# Patient Record
Sex: Female | Born: 1965 | Race: Black or African American | Hispanic: No | Marital: Married | Smoking: Current some day smoker
Health system: Southern US, Community
[De-identification: ages and names within clinical notes are randomized; demographics above are authoritative.]

## PROBLEM LIST (undated history)

## (undated) DIAGNOSIS — G4730 Sleep apnea, unspecified: Secondary | ICD-10-CM

## (undated) DIAGNOSIS — R51 Headache: Secondary | ICD-10-CM

## (undated) DIAGNOSIS — F329 Major depressive disorder, single episode, unspecified: Secondary | ICD-10-CM

## (undated) DIAGNOSIS — Z9221 Personal history of antineoplastic chemotherapy: Secondary | ICD-10-CM

## (undated) DIAGNOSIS — B351 Tinea unguium: Secondary | ICD-10-CM

## (undated) DIAGNOSIS — K5900 Constipation, unspecified: Secondary | ICD-10-CM

## (undated) DIAGNOSIS — R0602 Shortness of breath: Secondary | ICD-10-CM

## (undated) DIAGNOSIS — H548 Legal blindness, as defined in USA: Secondary | ICD-10-CM

## (undated) DIAGNOSIS — H3552 Pigmentary retinal dystrophy: Secondary | ICD-10-CM

## (undated) DIAGNOSIS — F32A Depression, unspecified: Secondary | ICD-10-CM

## (undated) DIAGNOSIS — Z8489 Family history of other specified conditions: Secondary | ICD-10-CM

## (undated) DIAGNOSIS — I10 Essential (primary) hypertension: Secondary | ICD-10-CM

## (undated) DIAGNOSIS — M1990 Unspecified osteoarthritis, unspecified site: Secondary | ICD-10-CM

## (undated) DIAGNOSIS — T883XXA Malignant hyperthermia due to anesthesia, initial encounter: Secondary | ICD-10-CM

## (undated) DIAGNOSIS — M797 Fibromyalgia: Secondary | ICD-10-CM

## (undated) DIAGNOSIS — E785 Hyperlipidemia, unspecified: Secondary | ICD-10-CM

## (undated) DIAGNOSIS — C3490 Malignant neoplasm of unspecified part of unspecified bronchus or lung: Secondary | ICD-10-CM

## (undated) DIAGNOSIS — Z923 Personal history of irradiation: Secondary | ICD-10-CM

## (undated) DIAGNOSIS — F419 Anxiety disorder, unspecified: Secondary | ICD-10-CM

## (undated) DIAGNOSIS — E119 Type 2 diabetes mellitus without complications: Secondary | ICD-10-CM

## (undated) DIAGNOSIS — L309 Dermatitis, unspecified: Secondary | ICD-10-CM

## (undated) DIAGNOSIS — K219 Gastro-esophageal reflux disease without esophagitis: Secondary | ICD-10-CM

## (undated) HISTORY — DX: Personal history of antineoplastic chemotherapy: Z92.21

## (undated) HISTORY — DX: Tinea unguium: B35.1

## (undated) HISTORY — DX: Legal blindness, as defined in USA: H54.8

## (undated) HISTORY — DX: Hyperlipidemia, unspecified: E78.5

## (undated) HISTORY — DX: Personal history of irradiation: Z92.3

## (undated) HISTORY — DX: Essential (primary) hypertension: I10

## (undated) HISTORY — DX: Pigmentary retinal dystrophy: H35.52

## (undated) HISTORY — PX: EYE SURGERY: SHX253

## (undated) HISTORY — DX: Type 2 diabetes mellitus without complications: E11.9

## (undated) HISTORY — DX: Headache: R51

## (undated) HISTORY — DX: Dermatitis, unspecified: L30.9

---

## 1999-11-01 ENCOUNTER — Emergency Department (HOSPITAL_COMMUNITY): Admission: EM | Admit: 1999-11-01 | Discharge: 1999-11-01 | Payer: Self-pay | Admitting: Emergency Medicine

## 2000-01-01 ENCOUNTER — Emergency Department (HOSPITAL_COMMUNITY): Admission: EM | Admit: 2000-01-01 | Discharge: 2000-01-01 | Payer: Self-pay | Admitting: Emergency Medicine

## 2000-01-06 ENCOUNTER — Encounter: Payer: Self-pay | Admitting: Emergency Medicine

## 2000-01-06 ENCOUNTER — Encounter: Admission: RE | Admit: 2000-01-06 | Discharge: 2000-01-06 | Payer: Self-pay | Admitting: Emergency Medicine

## 2000-06-08 ENCOUNTER — Other Ambulatory Visit: Admission: RE | Admit: 2000-06-08 | Discharge: 2000-06-08 | Payer: Self-pay | Admitting: Family Medicine

## 2000-06-08 ENCOUNTER — Encounter: Admission: RE | Admit: 2000-06-08 | Discharge: 2000-06-08 | Payer: Self-pay | Admitting: Family Medicine

## 2000-07-26 ENCOUNTER — Encounter: Payer: Self-pay | Admitting: Emergency Medicine

## 2000-07-26 ENCOUNTER — Emergency Department (HOSPITAL_COMMUNITY): Admission: EM | Admit: 2000-07-26 | Discharge: 2000-07-27 | Payer: Self-pay | Admitting: Dentistry

## 2000-08-19 ENCOUNTER — Emergency Department (HOSPITAL_COMMUNITY): Admission: EM | Admit: 2000-08-19 | Discharge: 2000-08-19 | Payer: Self-pay | Admitting: Emergency Medicine

## 2000-08-25 ENCOUNTER — Encounter: Admission: RE | Admit: 2000-08-25 | Discharge: 2000-08-25 | Payer: Self-pay | Admitting: Family Medicine

## 2000-08-25 ENCOUNTER — Encounter: Payer: Self-pay | Admitting: Family Medicine

## 2000-09-06 ENCOUNTER — Encounter: Admission: RE | Admit: 2000-09-06 | Discharge: 2000-09-06 | Payer: Self-pay | Admitting: Family Medicine

## 2000-09-08 ENCOUNTER — Encounter: Admission: RE | Admit: 2000-09-08 | Discharge: 2000-09-08 | Payer: Self-pay | Admitting: Family Medicine

## 2000-09-08 ENCOUNTER — Ambulatory Visit (HOSPITAL_COMMUNITY): Admission: RE | Admit: 2000-09-08 | Discharge: 2000-09-08 | Payer: Self-pay | Admitting: Family Medicine

## 2000-12-07 ENCOUNTER — Emergency Department (HOSPITAL_COMMUNITY): Admission: EM | Admit: 2000-12-07 | Discharge: 2000-12-07 | Payer: Self-pay | Admitting: Emergency Medicine

## 2001-03-21 ENCOUNTER — Emergency Department (HOSPITAL_COMMUNITY): Admission: EM | Admit: 2001-03-21 | Discharge: 2001-03-22 | Payer: Self-pay | Admitting: Emergency Medicine

## 2001-03-23 ENCOUNTER — Encounter: Admission: RE | Admit: 2001-03-23 | Discharge: 2001-03-23 | Payer: Self-pay | Admitting: Family Medicine

## 2001-05-22 ENCOUNTER — Encounter: Admission: RE | Admit: 2001-05-22 | Discharge: 2001-05-22 | Payer: Self-pay | Admitting: Family Medicine

## 2001-05-29 ENCOUNTER — Encounter: Admission: RE | Admit: 2001-05-29 | Discharge: 2001-05-29 | Payer: Self-pay | Admitting: Family Medicine

## 2001-05-31 ENCOUNTER — Emergency Department (HOSPITAL_COMMUNITY): Admission: EM | Admit: 2001-05-31 | Discharge: 2001-05-31 | Payer: Self-pay | Admitting: Emergency Medicine

## 2001-06-04 ENCOUNTER — Inpatient Hospital Stay (HOSPITAL_COMMUNITY): Admission: AD | Admit: 2001-06-04 | Discharge: 2001-06-04 | Payer: Self-pay | Admitting: Obstetrics and Gynecology

## 2001-06-06 ENCOUNTER — Encounter: Admission: RE | Admit: 2001-06-06 | Discharge: 2001-06-06 | Payer: Self-pay | Admitting: *Deleted

## 2001-06-15 ENCOUNTER — Emergency Department (HOSPITAL_COMMUNITY): Admission: EM | Admit: 2001-06-15 | Discharge: 2001-06-15 | Payer: Self-pay | Admitting: Emergency Medicine

## 2001-08-15 ENCOUNTER — Other Ambulatory Visit: Admission: RE | Admit: 2001-08-15 | Discharge: 2001-08-15 | Payer: Self-pay | Admitting: Obstetrics and Gynecology

## 2001-09-19 ENCOUNTER — Inpatient Hospital Stay (HOSPITAL_COMMUNITY): Admission: RE | Admit: 2001-09-19 | Discharge: 2001-09-21 | Payer: Self-pay | Admitting: Obstetrics and Gynecology

## 2001-09-19 ENCOUNTER — Encounter (INDEPENDENT_AMBULATORY_CARE_PROVIDER_SITE_OTHER): Payer: Self-pay

## 2001-09-19 HISTORY — PX: ABDOMINAL HYSTERECTOMY: SHX81

## 2002-04-18 ENCOUNTER — Encounter: Admission: RE | Admit: 2002-04-18 | Discharge: 2002-04-18 | Payer: Self-pay | Admitting: Family Medicine

## 2002-06-14 ENCOUNTER — Encounter: Admission: RE | Admit: 2002-06-14 | Discharge: 2002-06-14 | Payer: Self-pay | Admitting: Family Medicine

## 2002-06-24 ENCOUNTER — Emergency Department (HOSPITAL_COMMUNITY): Admission: EM | Admit: 2002-06-24 | Discharge: 2002-06-24 | Payer: Self-pay | Admitting: Emergency Medicine

## 2002-10-13 ENCOUNTER — Encounter (INDEPENDENT_AMBULATORY_CARE_PROVIDER_SITE_OTHER): Payer: Self-pay | Admitting: *Deleted

## 2002-10-13 LAB — CONVERTED CEMR LAB

## 2002-10-17 ENCOUNTER — Other Ambulatory Visit: Admission: RE | Admit: 2002-10-17 | Discharge: 2002-10-17 | Payer: Self-pay | Admitting: Family Medicine

## 2002-10-17 ENCOUNTER — Encounter: Admission: RE | Admit: 2002-10-17 | Discharge: 2002-10-17 | Payer: Self-pay | Admitting: Family Medicine

## 2002-11-28 ENCOUNTER — Encounter: Admission: RE | Admit: 2002-11-28 | Discharge: 2002-11-28 | Payer: Self-pay | Admitting: Family Medicine

## 2002-12-26 ENCOUNTER — Encounter: Admission: RE | Admit: 2002-12-26 | Discharge: 2002-12-26 | Payer: Self-pay | Admitting: Family Medicine

## 2003-01-17 ENCOUNTER — Encounter: Admission: RE | Admit: 2003-01-17 | Discharge: 2003-01-17 | Payer: Self-pay | Admitting: Family Medicine

## 2003-01-22 ENCOUNTER — Encounter: Admission: RE | Admit: 2003-01-22 | Discharge: 2003-01-22 | Payer: Self-pay | Admitting: Family Medicine

## 2003-01-29 ENCOUNTER — Encounter: Admission: RE | Admit: 2003-01-29 | Discharge: 2003-01-29 | Payer: Self-pay | Admitting: Family Medicine

## 2003-03-19 ENCOUNTER — Encounter: Admission: RE | Admit: 2003-03-19 | Discharge: 2003-03-19 | Payer: Self-pay | Admitting: Family Medicine

## 2003-04-22 ENCOUNTER — Encounter: Admission: RE | Admit: 2003-04-22 | Discharge: 2003-04-22 | Payer: Self-pay | Admitting: Family Medicine

## 2003-04-24 ENCOUNTER — Encounter: Admission: RE | Admit: 2003-04-24 | Discharge: 2003-04-24 | Payer: Self-pay | Admitting: Sports Medicine

## 2003-06-10 ENCOUNTER — Encounter: Admission: RE | Admit: 2003-06-10 | Discharge: 2003-06-10 | Payer: Self-pay | Admitting: Family Medicine

## 2003-06-19 ENCOUNTER — Encounter: Admission: RE | Admit: 2003-06-19 | Discharge: 2003-06-19 | Payer: Self-pay | Admitting: Family Medicine

## 2003-07-21 ENCOUNTER — Encounter: Admission: RE | Admit: 2003-07-21 | Discharge: 2003-07-21 | Payer: Self-pay | Admitting: Family Medicine

## 2003-08-20 ENCOUNTER — Encounter: Admission: RE | Admit: 2003-08-20 | Discharge: 2003-08-20 | Payer: Self-pay | Admitting: Family Medicine

## 2003-08-27 ENCOUNTER — Encounter: Admission: RE | Admit: 2003-08-27 | Discharge: 2003-08-27 | Payer: Self-pay | Admitting: Family Medicine

## 2003-09-12 ENCOUNTER — Encounter: Admission: RE | Admit: 2003-09-12 | Discharge: 2003-09-12 | Payer: Self-pay | Admitting: Family Medicine

## 2003-10-28 ENCOUNTER — Encounter: Admission: RE | Admit: 2003-10-28 | Discharge: 2003-10-28 | Payer: Self-pay | Admitting: Family Medicine

## 2003-11-20 ENCOUNTER — Ambulatory Visit: Payer: Self-pay | Admitting: Family Medicine

## 2003-11-24 ENCOUNTER — Encounter: Admission: RE | Admit: 2003-11-24 | Discharge: 2003-11-24 | Payer: Self-pay | Admitting: Sports Medicine

## 2003-12-08 ENCOUNTER — Ambulatory Visit: Payer: Self-pay | Admitting: Family Medicine

## 2004-01-13 ENCOUNTER — Ambulatory Visit: Payer: Self-pay | Admitting: Sports Medicine

## 2004-06-09 ENCOUNTER — Ambulatory Visit: Payer: Self-pay | Admitting: Family Medicine

## 2004-07-03 ENCOUNTER — Emergency Department (HOSPITAL_COMMUNITY): Admission: EM | Admit: 2004-07-03 | Discharge: 2004-07-03 | Payer: Self-pay | Admitting: Family Medicine

## 2004-07-07 ENCOUNTER — Ambulatory Visit: Payer: Self-pay | Admitting: Family Medicine

## 2004-09-16 ENCOUNTER — Ambulatory Visit: Payer: Self-pay | Admitting: Family Medicine

## 2004-10-19 ENCOUNTER — Ambulatory Visit: Payer: Self-pay | Admitting: Family Medicine

## 2004-10-20 ENCOUNTER — Ambulatory Visit (HOSPITAL_COMMUNITY): Admission: RE | Admit: 2004-10-20 | Discharge: 2004-10-20 | Payer: Self-pay | Admitting: Family Medicine

## 2004-10-26 ENCOUNTER — Emergency Department (HOSPITAL_COMMUNITY): Admission: EM | Admit: 2004-10-26 | Discharge: 2004-10-26 | Payer: Self-pay | Admitting: Family Medicine

## 2005-03-24 ENCOUNTER — Ambulatory Visit: Payer: Self-pay | Admitting: Family Medicine

## 2005-04-15 ENCOUNTER — Ambulatory Visit (HOSPITAL_COMMUNITY): Admission: RE | Admit: 2005-04-15 | Discharge: 2005-04-15 | Payer: Self-pay | Admitting: Sports Medicine

## 2005-05-12 ENCOUNTER — Ambulatory Visit: Payer: Self-pay | Admitting: Family Medicine

## 2005-06-07 ENCOUNTER — Ambulatory Visit: Payer: Self-pay | Admitting: Sports Medicine

## 2005-06-30 ENCOUNTER — Ambulatory Visit: Payer: Self-pay | Admitting: Family Medicine

## 2005-07-18 ENCOUNTER — Ambulatory Visit: Payer: Self-pay | Admitting: Family Medicine

## 2005-08-15 ENCOUNTER — Emergency Department (HOSPITAL_COMMUNITY): Admission: EM | Admit: 2005-08-15 | Discharge: 2005-08-15 | Payer: Self-pay | Admitting: Family Medicine

## 2005-08-16 ENCOUNTER — Emergency Department (HOSPITAL_COMMUNITY): Admission: EM | Admit: 2005-08-16 | Discharge: 2005-08-16 | Payer: Self-pay | Admitting: Emergency Medicine

## 2005-11-07 ENCOUNTER — Emergency Department (HOSPITAL_COMMUNITY): Admission: EM | Admit: 2005-11-07 | Discharge: 2005-11-07 | Payer: Self-pay | Admitting: Family Medicine

## 2005-11-08 ENCOUNTER — Ambulatory Visit: Payer: Self-pay | Admitting: Sports Medicine

## 2006-01-09 ENCOUNTER — Emergency Department (HOSPITAL_COMMUNITY): Admission: EM | Admit: 2006-01-09 | Discharge: 2006-01-09 | Payer: Self-pay | Admitting: Emergency Medicine

## 2006-02-27 ENCOUNTER — Ambulatory Visit: Payer: Self-pay | Admitting: Sports Medicine

## 2006-04-21 ENCOUNTER — Ambulatory Visit: Payer: Self-pay | Admitting: Family Medicine

## 2006-05-11 DIAGNOSIS — F17200 Nicotine dependence, unspecified, uncomplicated: Secondary | ICD-10-CM | POA: Insufficient documentation

## 2006-05-11 DIAGNOSIS — IMO0002 Reserved for concepts with insufficient information to code with codable children: Secondary | ICD-10-CM | POA: Insufficient documentation

## 2006-05-11 DIAGNOSIS — F339 Major depressive disorder, recurrent, unspecified: Secondary | ICD-10-CM | POA: Insufficient documentation

## 2006-05-11 DIAGNOSIS — M1710 Unilateral primary osteoarthritis, unspecified knee: Secondary | ICD-10-CM

## 2006-05-12 ENCOUNTER — Encounter (INDEPENDENT_AMBULATORY_CARE_PROVIDER_SITE_OTHER): Payer: Self-pay | Admitting: *Deleted

## 2006-05-23 ENCOUNTER — Encounter (INDEPENDENT_AMBULATORY_CARE_PROVIDER_SITE_OTHER): Payer: Self-pay | Admitting: Family Medicine

## 2006-07-13 ENCOUNTER — Ambulatory Visit: Payer: Self-pay | Admitting: Sports Medicine

## 2006-07-13 ENCOUNTER — Encounter (INDEPENDENT_AMBULATORY_CARE_PROVIDER_SITE_OTHER): Payer: Self-pay | Admitting: Family Medicine

## 2006-07-18 ENCOUNTER — Emergency Department (HOSPITAL_COMMUNITY): Admission: EM | Admit: 2006-07-18 | Discharge: 2006-07-19 | Payer: Self-pay | Admitting: Emergency Medicine

## 2006-07-25 ENCOUNTER — Telehealth: Payer: Self-pay | Admitting: *Deleted

## 2006-07-28 ENCOUNTER — Encounter (INDEPENDENT_AMBULATORY_CARE_PROVIDER_SITE_OTHER): Payer: Self-pay | Admitting: Family Medicine

## 2006-12-01 ENCOUNTER — Encounter (INDEPENDENT_AMBULATORY_CARE_PROVIDER_SITE_OTHER): Payer: Self-pay | Admitting: Family Medicine

## 2006-12-07 ENCOUNTER — Telehealth: Payer: Self-pay | Admitting: *Deleted

## 2006-12-11 ENCOUNTER — Ambulatory Visit: Payer: Self-pay | Admitting: Family Medicine

## 2006-12-12 ENCOUNTER — Encounter (INDEPENDENT_AMBULATORY_CARE_PROVIDER_SITE_OTHER): Payer: Self-pay | Admitting: *Deleted

## 2006-12-15 ENCOUNTER — Encounter (INDEPENDENT_AMBULATORY_CARE_PROVIDER_SITE_OTHER): Payer: Self-pay | Admitting: Family Medicine

## 2007-01-02 ENCOUNTER — Telehealth (INDEPENDENT_AMBULATORY_CARE_PROVIDER_SITE_OTHER): Payer: Self-pay | Admitting: *Deleted

## 2007-01-02 ENCOUNTER — Ambulatory Visit: Payer: Self-pay | Admitting: Family Medicine

## 2007-01-08 ENCOUNTER — Encounter (INDEPENDENT_AMBULATORY_CARE_PROVIDER_SITE_OTHER): Payer: Self-pay | Admitting: Family Medicine

## 2007-01-10 ENCOUNTER — Encounter: Payer: Self-pay | Admitting: *Deleted

## 2007-01-26 ENCOUNTER — Ambulatory Visit: Payer: Self-pay

## 2007-01-26 LAB — CONVERTED CEMR LAB
Bilirubin Urine: NEGATIVE
Blood in Urine, dipstick: NEGATIVE
Glucose, Urine, Semiquant: NEGATIVE
Ketones, urine, test strip: NEGATIVE
Nitrite: NEGATIVE
Protein, U semiquant: 30
Specific Gravity, Urine: 1.02
Urobilinogen, UA: 1
WBC Urine, dipstick: NEGATIVE
pH: 6.5

## 2007-02-01 ENCOUNTER — Encounter (INDEPENDENT_AMBULATORY_CARE_PROVIDER_SITE_OTHER): Payer: Self-pay | Admitting: Family Medicine

## 2007-02-01 ENCOUNTER — Ambulatory Visit: Payer: Self-pay | Admitting: Family Medicine

## 2007-02-01 DIAGNOSIS — E119 Type 2 diabetes mellitus without complications: Secondary | ICD-10-CM | POA: Insufficient documentation

## 2007-02-01 DIAGNOSIS — E1165 Type 2 diabetes mellitus with hyperglycemia: Secondary | ICD-10-CM | POA: Insufficient documentation

## 2007-02-01 LAB — CONVERTED CEMR LAB
ALT: 14 units/L (ref 0–35)
AST: 14 units/L (ref 0–37)
Albumin: 4.5 g/dL (ref 3.5–5.2)
Alkaline Phosphatase: 47 units/L (ref 39–117)
BUN: 11 mg/dL (ref 6–23)
CO2: 25 meq/L (ref 19–32)
Calcium: 9.4 mg/dL (ref 8.4–10.5)
Chloride: 101 meq/L (ref 96–112)
Cholesterol: 168 mg/dL (ref 0–200)
Creatinine, Ser: 1.2 mg/dL (ref 0.40–1.20)
Glucose, Bld: 116 mg/dL — ABNORMAL HIGH (ref 70–99)
HDL: 36 mg/dL — ABNORMAL LOW (ref >39)
Hgb A1c MFr Bld: 6.2 %
LDL Cholesterol: 107 mg/dL — ABNORMAL HIGH (ref 0–99)
Potassium: 3.5 meq/L (ref 3.5–5.3)
Sodium: 139 meq/L (ref 135–145)
TSH: 0.604 microintl units/mL (ref 0.350–5.50)
Total Bilirubin: 0.9 mg/dL (ref 0.3–1.2)
Total CHOL/HDL Ratio: 4.7
Total Protein: 7.3 g/dL (ref 6.0–8.3)
Triglycerides: 125 mg/dL (ref <150)
VLDL: 25 mg/dL (ref 0–40)

## 2007-02-06 ENCOUNTER — Encounter (INDEPENDENT_AMBULATORY_CARE_PROVIDER_SITE_OTHER): Payer: Self-pay | Admitting: Family Medicine

## 2007-02-12 ENCOUNTER — Encounter (INDEPENDENT_AMBULATORY_CARE_PROVIDER_SITE_OTHER): Payer: Self-pay | Admitting: Family Medicine

## 2007-02-14 ENCOUNTER — Encounter: Payer: Self-pay | Admitting: *Deleted

## 2007-04-12 ENCOUNTER — Encounter (INDEPENDENT_AMBULATORY_CARE_PROVIDER_SITE_OTHER): Payer: Self-pay | Admitting: Family Medicine

## 2007-04-24 ENCOUNTER — Emergency Department (HOSPITAL_COMMUNITY): Admission: EM | Admit: 2007-04-24 | Discharge: 2007-04-24 | Payer: Self-pay | Admitting: Emergency Medicine

## 2007-05-04 ENCOUNTER — Encounter: Payer: Self-pay | Admitting: *Deleted

## 2007-05-09 ENCOUNTER — Encounter (INDEPENDENT_AMBULATORY_CARE_PROVIDER_SITE_OTHER): Payer: Self-pay | Admitting: *Deleted

## 2007-05-21 ENCOUNTER — Telehealth: Payer: Self-pay | Admitting: *Deleted

## 2007-05-22 ENCOUNTER — Encounter (INDEPENDENT_AMBULATORY_CARE_PROVIDER_SITE_OTHER): Payer: Self-pay | Admitting: Family Medicine

## 2007-05-22 ENCOUNTER — Ambulatory Visit: Payer: Self-pay | Admitting: Family Medicine

## 2007-05-22 LAB — CONVERTED CEMR LAB
ALT: 17 units/L (ref 0–35)
AST: 20 units/L (ref 0–37)
Albumin: 4.7 g/dL (ref 3.5–5.2)
Alkaline Phosphatase: 47 units/L (ref 39–117)
BUN: 12 mg/dL (ref 6–23)
Bilirubin Urine: NEGATIVE
Blood in Urine, dipstick: NEGATIVE
CO2: 26 meq/L (ref 19–32)
Calcium: 9.2 mg/dL (ref 8.4–10.5)
Chloride: 101 meq/L (ref 96–112)
Creatinine, Ser: 1.26 mg/dL — ABNORMAL HIGH (ref 0.40–1.20)
Glucose, Bld: 102 mg/dL — ABNORMAL HIGH (ref 70–99)
Glucose, Urine, Semiquant: NEGATIVE
H Pylori IgG: POSITIVE
HCV Ab: NEGATIVE
Hep A IgM: NEGATIVE
Hep B C IgM: NEGATIVE
Hepatitis B Surface Ag: NEGATIVE
Hgb A1c MFr Bld: 6.1 %
Ketones, urine, test strip: NEGATIVE
Nitrite: NEGATIVE
Potassium: 3.5 meq/L (ref 3.5–5.3)
Protein, U semiquant: 100
Sodium: 141 meq/L (ref 135–145)
Specific Gravity, Urine: >=1.03
Total Bilirubin: 0.7 mg/dL (ref 0.3–1.2)
Total Protein: 7.6 g/dL (ref 6.0–8.3)
Urobilinogen, UA: 1
WBC Urine, dipstick: NEGATIVE
pH: 6

## 2007-05-23 ENCOUNTER — Ambulatory Visit (HOSPITAL_COMMUNITY): Admission: RE | Admit: 2007-05-23 | Discharge: 2007-05-23 | Payer: Self-pay | Admitting: Family Medicine

## 2007-05-25 ENCOUNTER — Telehealth: Payer: Self-pay | Admitting: *Deleted

## 2007-07-17 ENCOUNTER — Encounter (INDEPENDENT_AMBULATORY_CARE_PROVIDER_SITE_OTHER): Payer: Self-pay | Admitting: Family Medicine

## 2007-07-17 ENCOUNTER — Encounter: Payer: Self-pay | Admitting: *Deleted

## 2007-07-17 ENCOUNTER — Ambulatory Visit: Payer: Self-pay | Admitting: Family Medicine

## 2007-07-17 LAB — CONVERTED CEMR LAB

## 2007-07-18 LAB — CONVERTED CEMR LAB
ALT: 14 units/L (ref 0–35)
AST: 16 units/L (ref 0–37)
Albumin: 4.5 g/dL (ref 3.5–5.2)
Alkaline Phosphatase: 44 units/L (ref 39–117)
BUN: 8 mg/dL (ref 6–23)
CO2: 25 meq/L (ref 19–32)
Calcium: 9.4 mg/dL (ref 8.4–10.5)
Chloride: 106 meq/L (ref 96–112)
Creatinine, Ser: 1.06 mg/dL (ref 0.40–1.20)
Glucose, Bld: 86 mg/dL (ref 70–99)
Potassium: 3.8 meq/L (ref 3.5–5.3)
Sodium: 142 meq/L (ref 135–145)
Total Bilirubin: 0.7 mg/dL (ref 0.3–1.2)
Total CK: 174 units/L (ref 7–177)
Total Protein: 7.3 g/dL (ref 6.0–8.3)

## 2007-07-25 ENCOUNTER — Ambulatory Visit (HOSPITAL_COMMUNITY): Admission: RE | Admit: 2007-07-25 | Discharge: 2007-07-25 | Payer: Self-pay | Admitting: Family Medicine

## 2007-07-25 ENCOUNTER — Ambulatory Visit: Payer: Self-pay | Admitting: Vascular Surgery

## 2007-07-25 ENCOUNTER — Telehealth (INDEPENDENT_AMBULATORY_CARE_PROVIDER_SITE_OTHER): Payer: Self-pay | Admitting: Family Medicine

## 2007-07-25 ENCOUNTER — Encounter (INDEPENDENT_AMBULATORY_CARE_PROVIDER_SITE_OTHER): Payer: Self-pay | Admitting: Neurology

## 2007-08-14 ENCOUNTER — Encounter (INDEPENDENT_AMBULATORY_CARE_PROVIDER_SITE_OTHER): Payer: Self-pay | Admitting: Family Medicine

## 2007-08-14 ENCOUNTER — Ambulatory Visit: Payer: Self-pay | Admitting: Family Medicine

## 2007-08-14 ENCOUNTER — Encounter: Payer: Self-pay | Admitting: *Deleted

## 2007-08-14 LAB — CONVERTED CEMR LAB
HCT: 47.1 % — ABNORMAL HIGH (ref 36.0–46.0)
Hemoglobin: 14.9 g/dL (ref 12.0–15.0)
MCHC: 31.6 g/dL (ref 30.0–36.0)
MCV: 94.4 fL (ref 78.0–100.0)
Platelets: 261 10*3/uL (ref 150–400)
RBC: 4.99 M/uL (ref 3.87–5.11)
RDW: 14.3 % (ref 11.5–15.5)
WBC: 10.1 10*3/uL (ref 4.0–10.5)

## 2007-09-26 ENCOUNTER — Ambulatory Visit: Payer: Self-pay | Admitting: Family Medicine

## 2007-09-26 ENCOUNTER — Encounter (INDEPENDENT_AMBULATORY_CARE_PROVIDER_SITE_OTHER): Payer: Self-pay | Admitting: Family Medicine

## 2007-09-26 LAB — CONVERTED CEMR LAB
Bilirubin Urine: NEGATIVE
Blood in Urine, dipstick: NEGATIVE
Glucose, Urine, Semiquant: NEGATIVE
Ketones, urine, test strip: NEGATIVE
Nitrite: NEGATIVE
Specific Gravity, Urine: 1.02
Urobilinogen, UA: 0.2
WBC Urine, dipstick: NEGATIVE
pH: 6.5

## 2007-09-27 LAB — CONVERTED CEMR LAB
Chlamydia, DNA Probe: NEGATIVE
GC Probe Amp, Genital: NEGATIVE

## 2007-10-09 ENCOUNTER — Ambulatory Visit: Payer: Self-pay | Admitting: Family Medicine

## 2007-10-09 LAB — CONVERTED CEMR LAB
Cholesterol, target level: 200 mg/dL
HDL goal, serum: 40 mg/dL
LDL Goal: 100 mg/dL

## 2007-10-16 ENCOUNTER — Encounter: Payer: Self-pay | Admitting: Family Medicine

## 2007-10-22 ENCOUNTER — Ambulatory Visit: Payer: Self-pay | Admitting: Family Medicine

## 2007-11-15 ENCOUNTER — Encounter: Payer: Self-pay | Admitting: Family Medicine

## 2007-12-14 ENCOUNTER — Encounter: Payer: Self-pay | Admitting: Family Medicine

## 2008-01-02 ENCOUNTER — Encounter: Payer: Self-pay | Admitting: Family Medicine

## 2008-02-04 ENCOUNTER — Telehealth: Payer: Self-pay | Admitting: *Deleted

## 2008-02-05 ENCOUNTER — Encounter: Payer: Self-pay | Admitting: Family Medicine

## 2008-02-05 ENCOUNTER — Inpatient Hospital Stay (HOSPITAL_COMMUNITY): Admission: EM | Admit: 2008-02-05 | Discharge: 2008-02-07 | Payer: Self-pay | Admitting: Emergency Medicine

## 2008-02-05 ENCOUNTER — Ambulatory Visit: Payer: Self-pay | Admitting: Family Medicine

## 2008-02-06 ENCOUNTER — Encounter: Payer: Self-pay | Admitting: Family Medicine

## 2008-02-06 LAB — CONVERTED CEMR LAB
Cholesterol: 158 mg/dL
HDL: 31 mg/dL
LDL Cholesterol: 113 mg/dL
Triglycerides: 71 mg/dL

## 2008-02-11 ENCOUNTER — Encounter: Payer: Self-pay | Admitting: Family Medicine

## 2008-02-12 ENCOUNTER — Encounter: Payer: Self-pay | Admitting: Family Medicine

## 2008-02-12 ENCOUNTER — Telehealth: Payer: Self-pay | Admitting: Family Medicine

## 2008-02-12 ENCOUNTER — Ambulatory Visit (HOSPITAL_COMMUNITY): Admission: RE | Admit: 2008-02-12 | Discharge: 2008-02-12 | Payer: Self-pay | Admitting: Family Medicine

## 2008-02-14 ENCOUNTER — Encounter: Payer: Self-pay | Admitting: Family Medicine

## 2008-02-14 ENCOUNTER — Telehealth: Payer: Self-pay | Admitting: Family Medicine

## 2008-02-14 ENCOUNTER — Ambulatory Visit: Payer: Self-pay | Admitting: Pulmonary Disease

## 2008-02-15 ENCOUNTER — Telehealth: Payer: Self-pay | Admitting: Family Medicine

## 2008-02-20 ENCOUNTER — Ambulatory Visit: Payer: Self-pay | Admitting: Family Medicine

## 2008-02-21 ENCOUNTER — Ambulatory Visit (HOSPITAL_COMMUNITY): Admission: RE | Admit: 2008-02-21 | Discharge: 2008-02-21 | Payer: Self-pay | Admitting: Pulmonary Disease

## 2008-02-21 ENCOUNTER — Encounter: Payer: Self-pay | Admitting: Pulmonary Disease

## 2008-02-21 ENCOUNTER — Encounter (INDEPENDENT_AMBULATORY_CARE_PROVIDER_SITE_OTHER): Payer: Self-pay | Admitting: Interventional Radiology

## 2008-02-25 ENCOUNTER — Telehealth: Payer: Self-pay | Admitting: Pulmonary Disease

## 2008-02-26 ENCOUNTER — Telehealth: Payer: Self-pay | Admitting: *Deleted

## 2008-02-28 ENCOUNTER — Ambulatory Visit: Payer: Self-pay | Admitting: Pulmonary Disease

## 2008-03-04 ENCOUNTER — Ambulatory Visit: Admission: RE | Admit: 2008-03-04 | Discharge: 2008-03-04 | Payer: Self-pay | Admitting: Pulmonary Disease

## 2008-03-05 ENCOUNTER — Ambulatory Visit: Payer: Self-pay | Admitting: Thoracic Surgery

## 2008-03-18 ENCOUNTER — Encounter: Payer: Self-pay | Admitting: Pulmonary Disease

## 2008-03-21 ENCOUNTER — Telehealth: Payer: Self-pay | Admitting: Family Medicine

## 2008-03-24 ENCOUNTER — Encounter: Payer: Self-pay | Admitting: Thoracic Surgery

## 2008-03-24 ENCOUNTER — Ambulatory Visit: Payer: Self-pay | Admitting: Pulmonary Disease

## 2008-03-24 ENCOUNTER — Inpatient Hospital Stay (HOSPITAL_COMMUNITY): Admission: RE | Admit: 2008-03-24 | Discharge: 2008-04-07 | Payer: Self-pay | Admitting: Thoracic Surgery

## 2008-03-24 ENCOUNTER — Ambulatory Visit: Payer: Self-pay | Admitting: Thoracic Surgery

## 2008-03-24 HISTORY — PX: OTHER SURGICAL HISTORY: SHX169

## 2008-04-01 HISTORY — PX: CHEST TUBE INSERTION: SHX231

## 2008-04-15 ENCOUNTER — Ambulatory Visit: Payer: Self-pay | Admitting: Thoracic Surgery

## 2008-04-15 ENCOUNTER — Encounter: Admission: RE | Admit: 2008-04-15 | Discharge: 2008-04-15 | Payer: Self-pay | Admitting: Thoracic Surgery

## 2008-04-17 ENCOUNTER — Encounter (INDEPENDENT_AMBULATORY_CARE_PROVIDER_SITE_OTHER): Payer: Self-pay | Admitting: Family Medicine

## 2008-04-17 ENCOUNTER — Encounter: Payer: Self-pay | Admitting: Family Medicine

## 2008-04-17 ENCOUNTER — Ambulatory Visit: Payer: Self-pay | Admitting: Family Medicine

## 2008-04-17 LAB — CONVERTED CEMR LAB
ALT: 21 units/L (ref 0–35)
AST: 16 units/L (ref 0–37)
Albumin: 3.1 g/dL — ABNORMAL LOW (ref 3.5–5.2)
Alkaline Phosphatase: 87 units/L (ref 39–117)
BUN: 9 mg/dL (ref 6–23)
Basophils Absolute: 0.1 10*3/uL (ref 0.0–0.1)
Basophils Relative: 1 % (ref 0–1)
CO2: 24 meq/L (ref 19–32)
Calcium: 9.3 mg/dL (ref 8.4–10.5)
Chloride: 104 meq/L (ref 96–112)
Creatinine, Ser: 1.05 mg/dL (ref 0.40–1.20)
Eosinophils Absolute: 0.5 10*3/uL (ref 0.0–0.7)
Eosinophils Relative: 5 % (ref 0–5)
Glucose, Bld: 72 mg/dL (ref 70–99)
H Pylori IgG: NEGATIVE
HCT: 31 % — ABNORMAL LOW (ref 36.0–46.0)
Hemoglobin: 10.7 g/dL — ABNORMAL LOW (ref 12.0–15.0)
Hgb A1c MFr Bld: 6.6 %
Lymphocytes Relative: 17 % (ref 12–46)
Lymphs Abs: 1.9 10*3/uL (ref 0.7–4.0)
MCHC: 34.4 g/dL (ref 30.0–36.0)
MCV: 85.8 fL (ref 78.0–100.0)
Monocytes Absolute: 0.9 10*3/uL (ref 0.1–1.0)
Monocytes Relative: 8 % (ref 3–12)
Neutro Abs: 7.3 10*3/uL (ref 1.7–7.7)
Neutrophils Relative %: 69 % (ref 43–77)
Platelets: 661 10*3/uL — ABNORMAL HIGH (ref 150–400)
Potassium: 4.1 meq/L (ref 3.5–5.3)
RBC: 3.62 M/uL — ABNORMAL LOW (ref 3.87–5.11)
RDW: 14.8 % (ref 11.5–15.5)
Sodium: 140 meq/L (ref 135–145)
Total Bilirubin: 0.8 mg/dL (ref 0.3–1.2)
Total Protein: 7.5 g/dL (ref 6.0–8.3)
WBC: 10.6 10*3/uL — ABNORMAL HIGH (ref 4.0–10.5)

## 2008-04-18 ENCOUNTER — Encounter: Payer: Self-pay | Admitting: Family Medicine

## 2008-04-29 ENCOUNTER — Ambulatory Visit: Payer: Self-pay | Admitting: Thoracic Surgery

## 2008-04-29 ENCOUNTER — Encounter: Admission: RE | Admit: 2008-04-29 | Discharge: 2008-04-29 | Payer: Self-pay | Admitting: Thoracic Surgery

## 2008-04-30 ENCOUNTER — Ambulatory Visit: Payer: Self-pay | Admitting: Internal Medicine

## 2008-05-05 ENCOUNTER — Ambulatory Visit: Payer: Self-pay | Admitting: Thoracic Surgery

## 2008-05-06 ENCOUNTER — Encounter: Payer: Self-pay | Admitting: Family Medicine

## 2008-05-12 ENCOUNTER — Telehealth: Payer: Self-pay | Admitting: Family Medicine

## 2008-05-14 ENCOUNTER — Ambulatory Visit: Payer: Self-pay | Admitting: Family Medicine

## 2008-05-14 ENCOUNTER — Encounter: Payer: Self-pay | Admitting: Family Medicine

## 2008-05-14 LAB — CONVERTED CEMR LAB
ALT: 8 units/L (ref 0–35)
AST: 9 units/L (ref 0–37)
Albumin: 4.1 g/dL (ref 3.5–5.2)
Alkaline Phosphatase: 51 units/L (ref 39–117)
BUN: 9 mg/dL (ref 6–23)
CO2: 23 meq/L (ref 19–32)
Calcium: 9.1 mg/dL (ref 8.4–10.5)
Chloride: 102 meq/L (ref 96–112)
Creatinine, Ser: 1 mg/dL (ref 0.40–1.20)
Glucose, Bld: 98 mg/dL (ref 70–99)
Lipase: 63 units/L (ref 0–75)
Potassium: 3.6 meq/L (ref 3.5–5.3)
Sodium: 139 meq/L (ref 135–145)
Total Bilirubin: 0.4 mg/dL (ref 0.3–1.2)
Total Protein: 7.4 g/dL (ref 6.0–8.3)

## 2008-05-15 ENCOUNTER — Encounter: Payer: Self-pay | Admitting: Family Medicine

## 2008-05-21 ENCOUNTER — Telehealth: Payer: Self-pay | Admitting: Family Medicine

## 2008-05-21 ENCOUNTER — Encounter: Admission: RE | Admit: 2008-05-21 | Discharge: 2008-05-21 | Payer: Self-pay | Admitting: Family Medicine

## 2008-05-22 ENCOUNTER — Telehealth: Payer: Self-pay | Admitting: Family Medicine

## 2008-05-28 ENCOUNTER — Ambulatory Visit: Payer: Self-pay | Admitting: Thoracic Surgery

## 2008-05-28 ENCOUNTER — Encounter: Admission: RE | Admit: 2008-05-28 | Discharge: 2008-05-28 | Payer: Self-pay | Admitting: Thoracic Surgery

## 2008-05-30 ENCOUNTER — Telehealth: Payer: Self-pay | Admitting: Family Medicine

## 2008-06-24 ENCOUNTER — Ambulatory Visit: Payer: Self-pay | Admitting: Family Medicine

## 2008-06-24 ENCOUNTER — Encounter: Payer: Self-pay | Admitting: Family Medicine

## 2008-08-05 ENCOUNTER — Encounter: Admission: RE | Admit: 2008-08-05 | Discharge: 2008-08-05 | Payer: Self-pay | Admitting: Thoracic Surgery

## 2008-08-05 ENCOUNTER — Ambulatory Visit: Payer: Self-pay | Admitting: Thoracic Surgery

## 2008-10-02 ENCOUNTER — Telehealth: Payer: Self-pay | Admitting: Family Medicine

## 2008-10-02 ENCOUNTER — Ambulatory Visit: Payer: Self-pay | Admitting: Family Medicine

## 2008-10-02 ENCOUNTER — Encounter: Payer: Self-pay | Admitting: Family Medicine

## 2008-10-02 LAB — CONVERTED CEMR LAB: TSH: 0.635 microintl units/mL (ref 0.350–4.500)

## 2008-10-20 ENCOUNTER — Telehealth: Payer: Self-pay | Admitting: Family Medicine

## 2008-10-20 ENCOUNTER — Telehealth: Payer: Self-pay | Admitting: *Deleted

## 2008-10-21 ENCOUNTER — Encounter: Payer: Self-pay | Admitting: Family Medicine

## 2008-10-21 ENCOUNTER — Ambulatory Visit: Payer: Self-pay | Admitting: Family Medicine

## 2008-10-21 ENCOUNTER — Telehealth: Payer: Self-pay | Admitting: *Deleted

## 2008-10-27 ENCOUNTER — Ambulatory Visit: Payer: Self-pay | Admitting: Internal Medicine

## 2008-10-29 ENCOUNTER — Ambulatory Visit (HOSPITAL_COMMUNITY): Admission: RE | Admit: 2008-10-29 | Discharge: 2008-10-29 | Payer: Self-pay | Admitting: Internal Medicine

## 2008-10-29 LAB — COMPREHENSIVE METABOLIC PANEL
ALT: 20 U/L (ref 0–35)
AST: 21 U/L (ref 0–37)
Albumin: 4.1 g/dL (ref 3.5–5.2)
Alkaline Phosphatase: 49 U/L (ref 39–117)
BUN: 10 mg/dL (ref 6–23)
CO2: 31 mEq/L (ref 19–32)
Calcium: 9.1 mg/dL (ref 8.4–10.5)
Chloride: 104 mEq/L (ref 96–112)
Creatinine, Ser: 1.23 mg/dL — ABNORMAL HIGH (ref 0.40–1.20)
Glucose, Bld: 105 mg/dL — ABNORMAL HIGH (ref 70–99)
Potassium: 3.6 mEq/L (ref 3.5–5.3)
Sodium: 142 mEq/L (ref 135–145)
Total Bilirubin: 0.8 mg/dL (ref 0.3–1.2)
Total Protein: 7.1 g/dL (ref 6.0–8.3)

## 2008-10-29 LAB — CBC WITH DIFFERENTIAL/PLATELET
BASO%: 0.4 % (ref 0.0–2.0)
Basophils Absolute: 0 10*3/uL (ref 0.0–0.1)
EOS%: 2.6 % (ref 0.0–7.0)
Eosinophils Absolute: 0.2 10*3/uL (ref 0.0–0.5)
HCT: 44.5 % (ref 34.8–46.6)
HGB: 15.1 g/dL (ref 11.6–15.9)
LYMPH%: 35.3 % (ref 14.0–49.7)
MCH: 29.7 pg (ref 25.1–34.0)
MCHC: 33.8 g/dL (ref 31.5–36.0)
MCV: 87.8 fL (ref 79.5–101.0)
MONO#: 0.6 10*3/uL (ref 0.1–0.9)
MONO%: 7.1 % (ref 0.0–14.0)
NEUT#: 4.8 10*3/uL (ref 1.5–6.5)
NEUT%: 54.6 % (ref 38.4–76.8)
Platelets: 214 10*3/uL (ref 145–400)
RBC: 5.07 10*6/uL (ref 3.70–5.45)
RDW: 15.1 % — ABNORMAL HIGH (ref 11.2–14.5)
WBC: 8.7 10*3/uL (ref 3.9–10.3)
lymph#: 3.1 10*3/uL (ref 0.9–3.3)

## 2008-11-03 ENCOUNTER — Encounter: Payer: Self-pay | Admitting: Family Medicine

## 2008-11-06 ENCOUNTER — Ambulatory Visit (HOSPITAL_COMMUNITY): Admission: RE | Admit: 2008-11-06 | Discharge: 2008-11-06 | Payer: Self-pay | Admitting: Internal Medicine

## 2008-11-10 ENCOUNTER — Encounter: Payer: Self-pay | Admitting: Family Medicine

## 2008-11-19 ENCOUNTER — Ambulatory Visit: Payer: Self-pay | Admitting: Thoracic Surgery

## 2009-01-13 ENCOUNTER — Encounter: Payer: Self-pay | Admitting: Family Medicine

## 2009-01-13 ENCOUNTER — Ambulatory Visit: Payer: Self-pay | Admitting: Family Medicine

## 2009-01-13 LAB — CONVERTED CEMR LAB
BUN: 11 mg/dL (ref 6–23)
CO2: 28 meq/L (ref 19–32)
Calcium: 9.5 mg/dL (ref 8.4–10.5)
Chloride: 103 meq/L (ref 96–112)
Creatinine, Ser: 1.31 mg/dL — ABNORMAL HIGH (ref 0.40–1.20)
Glucose, Bld: 107 mg/dL — ABNORMAL HIGH (ref 70–99)
Potassium: 3.8 meq/L (ref 3.5–5.3)
Sodium: 142 meq/L (ref 135–145)

## 2009-01-14 ENCOUNTER — Encounter: Payer: Self-pay | Admitting: *Deleted

## 2009-01-15 ENCOUNTER — Encounter: Payer: Self-pay | Admitting: Family Medicine

## 2009-01-15 ENCOUNTER — Encounter: Admission: RE | Admit: 2009-01-15 | Discharge: 2009-01-15 | Payer: Self-pay | Admitting: Family Medicine

## 2009-01-16 ENCOUNTER — Encounter: Payer: Self-pay | Admitting: Family Medicine

## 2009-03-17 ENCOUNTER — Encounter: Payer: Self-pay | Admitting: Family Medicine

## 2009-04-01 ENCOUNTER — Emergency Department (HOSPITAL_COMMUNITY): Admission: EM | Admit: 2009-04-01 | Discharge: 2009-04-01 | Payer: Self-pay | Admitting: Family Medicine

## 2009-04-08 ENCOUNTER — Encounter: Payer: Self-pay | Admitting: Family Medicine

## 2009-04-08 ENCOUNTER — Ambulatory Visit: Payer: Self-pay | Admitting: Family Medicine

## 2009-04-09 ENCOUNTER — Telehealth: Payer: Self-pay | Admitting: Gastroenterology

## 2009-04-17 ENCOUNTER — Encounter: Payer: Self-pay | Admitting: Physician Assistant

## 2009-04-17 ENCOUNTER — Ambulatory Visit: Payer: Self-pay | Admitting: Internal Medicine

## 2009-04-17 DIAGNOSIS — E7800 Pure hypercholesterolemia, unspecified: Secondary | ICD-10-CM | POA: Insufficient documentation

## 2009-04-17 DIAGNOSIS — I10 Essential (primary) hypertension: Secondary | ICD-10-CM | POA: Insufficient documentation

## 2009-04-17 DIAGNOSIS — Z85118 Personal history of other malignant neoplasm of bronchus and lung: Secondary | ICD-10-CM | POA: Insufficient documentation

## 2009-04-20 ENCOUNTER — Encounter: Payer: Self-pay | Admitting: Family Medicine

## 2009-04-20 ENCOUNTER — Ambulatory Visit: Payer: Self-pay | Admitting: Internal Medicine

## 2009-04-20 ENCOUNTER — Encounter: Payer: Self-pay | Admitting: Physician Assistant

## 2009-04-20 LAB — CONVERTED CEMR LAB
ALT: 29 units/L (ref 0–35)
AST: 22 units/L (ref 0–37)
Albumin: 4.4 g/dL (ref 3.5–5.2)
Alkaline Phosphatase: 51 units/L (ref 39–117)
BUN: 9 mg/dL (ref 6–23)
Basophils Absolute: 0.3 10*3/uL — ABNORMAL HIGH (ref 0.0–0.1)
Basophils Relative: 2.8 % (ref 0.0–3.0)
CO2: 31 meq/L (ref 19–32)
Calcium: 9.3 mg/dL (ref 8.4–10.5)
Chloride: 105 meq/L (ref 96–112)
Creatinine, Ser: 1.2 mg/dL (ref 0.4–1.2)
Eosinophils Absolute: 0.3 10*3/uL (ref 0.0–0.7)
Eosinophils Relative: 2.8 % (ref 0.0–5.0)
GFR calc non Af Amer: 62.23 mL/min (ref >60)
Glucose, Bld: 97 mg/dL (ref 70–99)
HCT: 48.3 % — ABNORMAL HIGH (ref 36.0–46.0)
Hemoglobin: 15.9 g/dL — ABNORMAL HIGH (ref 12.0–15.0)
Lipase: 43 units/L (ref 11.0–59.0)
Lymphocytes Relative: 30 % (ref 12.0–46.0)
Lymphs Abs: 3.1 10*3/uL (ref 0.7–4.0)
MCHC: 32.8 g/dL (ref 30.0–36.0)
MCV: 93.6 fL (ref 78.0–100.0)
Monocytes Absolute: 0.6 10*3/uL (ref 0.1–1.0)
Monocytes Relative: 5.6 % (ref 3.0–12.0)
Neutro Abs: 5.9 10*3/uL (ref 1.4–7.7)
Neutrophils Relative %: 58.8 % (ref 43.0–77.0)
Platelets: 238 10*3/uL (ref 150.0–400.0)
Potassium: 3.2 meq/L — ABNORMAL LOW (ref 3.5–5.1)
RBC: 5.16 M/uL — ABNORMAL HIGH (ref 3.87–5.11)
RDW: 13.9 % (ref 11.5–14.6)
Sodium: 142 meq/L (ref 135–145)
Total Bilirubin: 0.9 mg/dL (ref 0.3–1.2)
Total Protein: 7.5 g/dL (ref 6.0–8.3)
WBC: 10.2 10*3/uL (ref 4.5–10.5)

## 2009-04-21 ENCOUNTER — Telehealth (INDEPENDENT_AMBULATORY_CARE_PROVIDER_SITE_OTHER): Payer: Self-pay | Admitting: *Deleted

## 2009-04-29 ENCOUNTER — Ambulatory Visit: Payer: Self-pay | Admitting: Internal Medicine

## 2009-05-01 ENCOUNTER — Telehealth (INDEPENDENT_AMBULATORY_CARE_PROVIDER_SITE_OTHER): Payer: Self-pay | Admitting: *Deleted

## 2009-05-04 ENCOUNTER — Ambulatory Visit (HOSPITAL_COMMUNITY): Admission: RE | Admit: 2009-05-04 | Discharge: 2009-05-04 | Payer: Self-pay | Admitting: Internal Medicine

## 2009-05-04 ENCOUNTER — Encounter: Payer: Self-pay | Admitting: *Deleted

## 2009-05-04 LAB — COMPREHENSIVE METABOLIC PANEL
ALT: 26 U/L (ref 0–35)
AST: 24 U/L (ref 0–37)
Albumin: 4.3 g/dL (ref 3.5–5.2)
Alkaline Phosphatase: 49 U/L (ref 39–117)
BUN: 10 mg/dL (ref 6–23)
CO2: 31 mEq/L (ref 19–32)
Calcium: 9.2 mg/dL (ref 8.4–10.5)
Chloride: 104 mEq/L (ref 96–112)
Creatinine, Ser: 1.27 mg/dL — ABNORMAL HIGH (ref 0.40–1.20)
Glucose, Bld: 91 mg/dL (ref 70–99)
Potassium: 3.3 mEq/L — ABNORMAL LOW (ref 3.5–5.3)
Sodium: 139 mEq/L (ref 135–145)
Total Bilirubin: 1.2 mg/dL (ref 0.3–1.2)
Total Protein: 7.8 g/dL (ref 6.0–8.3)

## 2009-05-04 LAB — CBC WITH DIFFERENTIAL/PLATELET
BASO%: 0.4 % (ref 0.0–2.0)
Basophils Absolute: 0 10*3/uL (ref 0.0–0.1)
EOS%: 3 % (ref 0.0–7.0)
Eosinophils Absolute: 0.3 10*3/uL (ref 0.0–0.5)
HCT: 49.4 % — ABNORMAL HIGH (ref 34.8–46.6)
HGB: 16.8 g/dL — ABNORMAL HIGH (ref 11.6–15.9)
LYMPH%: 27.5 % (ref 14.0–49.7)
MCH: 30.9 pg (ref 25.1–34.0)
MCHC: 34 g/dL (ref 31.5–36.0)
MCV: 91 fL (ref 79.5–101.0)
MONO#: 0.5 10*3/uL (ref 0.1–0.9)
MONO%: 6.4 % (ref 0.0–14.0)
NEUT#: 5.4 10*3/uL (ref 1.5–6.5)
NEUT%: 62.7 % (ref 38.4–76.8)
Platelets: 208 10*3/uL (ref 145–400)
RBC: 5.43 10*6/uL (ref 3.70–5.45)
RDW: 13.9 % (ref 11.2–14.5)
WBC: 8.6 10*3/uL (ref 3.9–10.3)
lymph#: 2.4 10*3/uL (ref 0.9–3.3)

## 2009-05-06 ENCOUNTER — Ambulatory Visit: Payer: Self-pay | Admitting: Cardiology

## 2009-05-11 ENCOUNTER — Encounter: Payer: Self-pay | Admitting: Family Medicine

## 2009-11-03 ENCOUNTER — Ambulatory Visit (HOSPITAL_BASED_OUTPATIENT_CLINIC_OR_DEPARTMENT_OTHER): Payer: MEDICARE | Admitting: Internal Medicine

## 2009-11-05 ENCOUNTER — Ambulatory Visit (HOSPITAL_COMMUNITY): Admission: RE | Admit: 2009-11-05 | Discharge: 2009-11-05 | Payer: Self-pay | Admitting: Internal Medicine

## 2009-11-05 LAB — CBC WITH DIFFERENTIAL/PLATELET
BASO%: 0.6 % (ref 0.0–2.0)
Basophils Absolute: 0.1 10*3/uL (ref 0.0–0.1)
EOS%: 2.7 % (ref 0.0–7.0)
Eosinophils Absolute: 0.3 10*3/uL (ref 0.0–0.5)
HCT: 48.5 % — ABNORMAL HIGH (ref 34.8–46.6)
HGB: 16.4 g/dL — ABNORMAL HIGH (ref 11.6–15.9)
LYMPH%: 26.3 % (ref 14.0–49.7)
MCH: 30.5 pg (ref 25.1–34.0)
MCHC: 33.8 g/dL (ref 31.5–36.0)
MCV: 90.4 fL (ref 79.5–101.0)
MONO#: 0.7 10*3/uL (ref 0.1–0.9)
MONO%: 7.8 % (ref 0.0–14.0)
NEUT#: 5.9 10*3/uL (ref 1.5–6.5)
NEUT%: 62.6 % (ref 38.4–76.8)
Platelets: 211 10*3/uL (ref 145–400)
RBC: 5.36 10*6/uL (ref 3.70–5.45)
RDW: 14.1 % (ref 11.2–14.5)
WBC: 9.4 10*3/uL (ref 3.9–10.3)
lymph#: 2.5 10*3/uL (ref 0.9–3.3)

## 2009-11-05 LAB — COMPREHENSIVE METABOLIC PANEL
ALT: 24 U/L (ref 0–35)
AST: 22 U/L (ref 0–37)
Albumin: 4.4 g/dL (ref 3.5–5.2)
Alkaline Phosphatase: 46 U/L (ref 39–117)
BUN: 12 mg/dL (ref 6–23)
CO2: 29 mEq/L (ref 19–32)
Calcium: 9 mg/dL (ref 8.4–10.5)
Chloride: 104 mEq/L (ref 96–112)
Creatinine, Ser: 1.3 mg/dL — ABNORMAL HIGH (ref 0.40–1.20)
Glucose, Bld: 107 mg/dL — ABNORMAL HIGH (ref 70–99)
Potassium: 3.2 mEq/L — ABNORMAL LOW (ref 3.5–5.3)
Sodium: 140 mEq/L (ref 135–145)
Total Bilirubin: 1 mg/dL (ref 0.3–1.2)
Total Protein: 7.5 g/dL (ref 6.0–8.3)

## 2009-11-05 LAB — LACTATE DEHYDROGENASE: LDH: 124 U/L (ref 94–250)

## 2009-11-06 ENCOUNTER — Telehealth: Payer: Self-pay | Admitting: Family Medicine

## 2009-11-06 ENCOUNTER — Encounter: Payer: Self-pay | Admitting: Family Medicine

## 2009-11-06 ENCOUNTER — Ambulatory Visit: Payer: Self-pay | Admitting: Family Medicine

## 2009-11-06 ENCOUNTER — Encounter (INDEPENDENT_AMBULATORY_CARE_PROVIDER_SITE_OTHER): Payer: Self-pay | Admitting: Pharmacist

## 2009-11-06 LAB — CONVERTED CEMR LAB
ALT: 19 units/L (ref 0–35)
AST: 18 units/L (ref 0–37)
Albumin: 4.6 g/dL (ref 3.5–5.2)
Alkaline Phosphatase: 46 units/L (ref 39–117)
BUN: 12 mg/dL (ref 6–23)
CO2: 27 meq/L (ref 19–32)
Calcium: 10 mg/dL (ref 8.4–10.5)
Chloride: 104 meq/L (ref 96–112)
Cholesterol: 227 mg/dL — ABNORMAL HIGH (ref 0–200)
Creatinine, Ser: 1.07 mg/dL (ref 0.40–1.20)
Glucose, Bld: 126 mg/dL — ABNORMAL HIGH (ref 70–99)
HDL: 39 mg/dL — ABNORMAL LOW (ref >39)
Hgb A1c MFr Bld: 6.1 %
LDL Cholesterol: 166 mg/dL — ABNORMAL HIGH (ref 0–99)
Potassium: 3.7 meq/L (ref 3.5–5.3)
Sodium: 141 meq/L (ref 135–145)
Total Bilirubin: 0.8 mg/dL (ref 0.3–1.2)
Total CHOL/HDL Ratio: 5.8
Total Protein: 7.4 g/dL (ref 6.0–8.3)
Triglycerides: 110 mg/dL (ref <150)
VLDL: 22 mg/dL (ref 0–40)

## 2009-11-09 ENCOUNTER — Encounter: Payer: Self-pay | Admitting: Family Medicine

## 2010-01-15 ENCOUNTER — Encounter: Payer: Self-pay | Admitting: Family Medicine

## 2010-03-11 ENCOUNTER — Emergency Department (HOSPITAL_COMMUNITY)
Admission: EM | Admit: 2010-03-11 | Discharge: 2010-03-11 | Payer: Self-pay | Source: Home / Self Care | Admitting: Emergency Medicine

## 2010-03-19 ENCOUNTER — Ambulatory Visit
Admission: RE | Admit: 2010-03-19 | Discharge: 2010-03-19 | Payer: Self-pay | Source: Home / Self Care | Attending: Family Medicine | Admitting: Family Medicine

## 2010-03-19 DIAGNOSIS — M545 Low back pain, unspecified: Secondary | ICD-10-CM | POA: Insufficient documentation

## 2010-04-02 ENCOUNTER — Other Ambulatory Visit (HOSPITAL_COMMUNITY): Payer: Self-pay | Admitting: Oncology

## 2010-04-02 DIAGNOSIS — C3490 Malignant neoplasm of unspecified part of unspecified bronchus or lung: Secondary | ICD-10-CM

## 2010-04-04 ENCOUNTER — Encounter: Payer: Self-pay | Admitting: Thoracic Surgery

## 2010-04-04 ENCOUNTER — Encounter: Payer: Self-pay | Admitting: Internal Medicine

## 2010-04-04 ENCOUNTER — Encounter: Payer: Self-pay | Admitting: Sports Medicine

## 2010-04-12 ENCOUNTER — Telehealth: Payer: Self-pay | Admitting: Family Medicine

## 2010-04-13 NOTE — Consult Note (Signed)
Summary: [facility]  [facility]   Imported By: Haydee Salter 01/15/2007 15:09:59  _____________________________________________________________________  External Attachment:    Type:   Image     Comment:   External Document

## 2010-04-13 NOTE — Progress Notes (Signed)
  Phone Note Outgoing Call   Summary of Call:  Morrie Sheldon contacted pre-cert. and they will not approve CT at [facility] only at [facility] so test r/s to [address].Message left on pt's phone to call me back. Initial call taken by: Teryl Lucy RN,  May 01, 2009 8:48 AM

## 2010-04-13 NOTE — Assessment & Plan Note (Signed)
Summary: NEW dx DM II   Vital Signs:  Patient Profile:   45 Years Old Female Weight:      218 pounds Pulse rate:   65 / minute BP sitting:   163 / 97  Vitals Entered By: Lillia Pauls CMA (February 01, 2007 11:06 AM)                 PCP:  Wilhemina Bonito  MD  Chief Complaint:  check for dm.  History of Present Illness: At work last week, got so sick, had shakes, cold sweat, pt got checked for diabetes, and CBG was high.  Pt having complaints of polyuria, polydypsia, fatigue, insomnia, and weight gain of >100lbs over last year.  Pt was told in the past that she was a "borderline" diabetic but never went on medication or went through exercise program.  No fevers, or illnesses.      Past Medical History:    Reviewed history from 12/11/2006 and no changes required:       [id number],        retinitis pigmetosa,        Sinusitis       depression       obesity       low back pain       disk herniation       migraines       DM TYPE II- 11/08- new dx  Past Surgical History:    Reviewed history from 05/11/2006 and no changes required:       BTL - 09/06/2000, c-s x3 - 08/25/2000, eye surgeries - 08/25/2000, Hysterectomy - Partial - 09/11/2001, Spirometry - mild - mod obs dz - 10/12/2004   Family History:    Reviewed history from 12/11/2006 and no changes required:       F- died unknown causes, GF-CAD, GM-DM, htn, M-htn, DM, mom with early menopause, no h/o breat/colon ca, u x2-CAD       daughter , sister, and cousins with migraines.  mother sometimes has a migraine       [place] with MIs x 2       CVAs in maternal aunt.  .    Social History:    Reviewed history from 05/11/2006 and no changes required:       Smoker-20 pack year hx (3-4 ppd); occas EtOH; married with 4 kids     Physical Exam  General:     Well-developed,well-nourished,in no acute distress; alert,appropriate and cooperative throughout examination, obese Mouth:     Oral mucosa and oropharynx without lesions or  exudates.  Teeth in good repair. Lungs:     Normal respiratory effort, chest expands symmetrically. Lungs are clear to auscultation, no crackles or wheezes. Heart:     Normal rate and regular rhythm. S1 and S2 normal without gallop, murmur, click, rub or other extra sounds. Extremities:     No edema, or deformity noted with normal full range of motion of all joints.   Neurologic:     alert & oriented X3, cranial nerves II-XII intact, and sensation intact to light touch.      Impression & Recommendations:  Problem # 1:  DIABETES-TYPE 2 (ICD-250.00) Assessment: New New diagnosis for patient.  Spent >40 minutes discussing causes, treatment options, and course of diabetes and importance of education and good control.  Checked HbA1c today which is <7.  Will go ahead and begin Metformin since pt does not seem that motivated to lose weight and follow strict diet plan.  Pt is happy to know that Metformin may help her lose a little bit of weight.  pt encouraged to lose 10% in next 2-3 months. Will send pt to DM education center for education on DM and Diet and meds.  Will give Rx for glucometer and strips and lancets.  Will follow up in 1 month.  Will check cholesterol panel and thyroid studies and electrolytes today to look at prior to starting any further meds.   Her updated medication list for this problem includes:    Diovan Hct 160-12.5 Mg Tabs (Valsartan-hydrochlorothiazide) .Marland Kitchen... Take 1 tablet by mouth once a day    Metformin Hcl 500 Mg Tabs (Metformin hcl) .Marland Kitchen... Take 1 tablet in am for 1 week, if no gi problems, then increase to 1 tablet twice a day.  Orders: [facility]- Est  Level 4 ([address]) Comp Met-[facility] (60454-09811) Lipid-[facility] (91478-29562) A1C-[facility] ([address]) TSH-[facility] (57846-96295) UA Microalbumin-[facility] (28413) Diabetic Clinic Referral (Diabetic)   Problem # 2:  HYPERTENSION, BENIGN SYSTEMIC (ICD-401.1) not well controlled, but will follow up in 1 month.  encouraged pt to begin exercising and to  watch salt intake.  At this time, could only address 1 new problem.   Her updated medication list for this problem includes:    Diovan Hct 160-12.5 Mg Tabs (Valsartan-hydrochlorothiazide) .Marland Kitchen... Take 1 tablet by mouth once a day    Metoprolol Succinate 200 Mg Tb24 (Metoprolol succinate) .Marland Kitchen... Take 1 tablet by mouth once a day    Caduet 10-10 Mg Tabs (Amlodipine-atorvastatin) .Marland Kitchen... 1 tablet a day for blood pressure and cholesterol   Problem # 3:  ECZEMA, ATOPIC DERMATITIS (ICD-691.8) refilled pt's cream.   Her updated medication list for this problem includes:    Fluocinonide 0.05 % Crea (Fluocinonide) .Marland Kitchen... Appy to affected area twice a day. disp 60 gram tube   Complete Medication List: 1)  Advair Diskus 500-50 Mcg/dose Misc (Fluticasone-salmeterol) .... Inhale 1 puff as directed twice a day 2)  Albuterol 90 Mcg/act Aers (Albuterol) .... Inhale 2 puff as directed every four hours 3)  Diovan Hct 160-12.5 Mg Tabs (Valsartan-hydrochlorothiazide) .... Take 1 tablet by mouth once a day 4)  Metoprolol Succinate 200 Mg Tb24 (Metoprolol succinate) .... Take 1 tablet by mouth once a day 5)  Protonix 40 Mg Tbec (Pantoprazole sodium) .... Take 1 tablet by mouth once a day 6)  Sertraline Hcl 100 Mg Tabs (Sertraline hcl) .... Take 1 1/2 tablet by mouth once a day 7)  Spiriva Handihaler 18 Mcg Caps (Tiotropium bromide monohydrate) .... Inhale 1 capsule as directed once a day 8)  Zyrtec Allergy 10 Mg Tabs (Cetirizine hcl) .... Take 1 tablet by mouth once a day 9)  Caduet 10-10 Mg Tabs (Amlodipine-atorvastatin) .Marland Kitchen.. 1 tablet a day for blood pressure and cholesterol 10)  Metrogel 1 % Gel (Metronidazole) .Marland Kitchen.. 1 applicator full into vagina x 5 days, use 1 time daily. 11)  Midrin 325-65-100 Mg Caps (Apap-isometheptene-dichloral) .... Take 2 tab at onset of headache, may repeat with 1 tablet every 1 up to 5 tablet in 12 hrs. 12)  Fluocinonide 0.05 % Crea (Fluocinonide) .... Appy to affected area twice a day. disp  60 gram tube 13)  Flexeril 10 Mg Tabs (Cyclobenzaprine hcl) .Marland Kitchen.. 1 tablet by mouth at bedtime 14)  Metformin Hcl 500 Mg Tabs (Metformin hcl) .... Take 1 tablet in am for 1 week, if no gi problems, then increase to 1 tablet twice a day. 15)  Medtronic Glucometer, Strips, and Lancets  .... Needing 1 glucometer,  strips #60, lancets #60 disp qs   Patient Instructions: 1)  Please schedule a follow-up appointment in 1 month. 2)  Go diabetes [facility] nutrition center for education 3)  Find out which medtronic glucometer you will need 4)  follow ada 1800 to 2000kcal diet.    Prescriptions: MEDTRONIC GLUCOMETER, STRIPS, AND LANCETS needing 1 glucometer, strips #60, lancets #60 disp QS  #1 x 1   Entered and Authorized by:   Wilhemina Bonito  MD   Signed by:   Wilhemina Bonito  MD on 02/01/2007   Method used:   Print then Give to Patient   RxID:   [id number] FLUOCINONIDE 0.05 %  CREA (FLUOCINONIDE) appy to affected area twice a day. disp 60 gram tube  #1 x 2   Entered and Authorized by:   Wilhemina Bonito  MD   Signed by:   Wilhemina Bonito  MD on 02/01/2007   Method used:   Electronically sent to ...       [facility] [address]       [address]       [place], [place]  [address]       Ph: [phone]       Fax: [phone]   RxID:   [id number] METFORMIN HCL 500 MG TABS (METFORMIN HCL) take 1 tablet in AM for 1 week, if no GI problems, then increase to 1 tablet twice a day.  #60 x 6   Entered and Authorized by:   Wilhemina Bonito  MD   Signed by:   Wilhemina Bonito  MD on 02/01/2007   Method used:   Electronically sent to ...       [facility] [address]       [address]       [place], [place]  [address]       Ph: [phone]       Fax: [phone]   RxID:   [id number]  ] Laboratory Results   Urine Tests  Date/Time Received: February 01, 2007 11:48 AM  Date/Time Reported: February 01, 2007 12:02 PM  Microalbumin (urine): 1+ mg/L      Comments: ...................................................................DONNA [facility]  February 01, 2007 12:02 PM   Blood Tests   Date/Time Received: February 01, 2007 11:48 AM  Date/Time Reported: February 01, 2007 12:03 PM   HGBA1C: 6.2%   (Normal Range: Non-Diabetic - 3-6%   Control Diabetic - 6-8%)  Comments: ...................................................................DONNA [facility]  February 01, 2007 12:03 PM

## 2010-04-13 NOTE — Letter (Signed)
Summary: *Referral Letter  [facility]  [address]   [place], [place] [address]   Phone: [phone]  Fax:     07/13/2006  Thank you in advance for agreeing to see my patient:  Theresa [name] [address] [place], [place]  [address]  Phone: [phone]  Reason for Referral: 46 yo AAF ( new pt for me ) with lumbar spine chronic pain 2 to disk herniation. She has been follow up by Dr. Elesa Massed at [facility] for more than 1 year. Pt desires to seek secondo opinion. Since 01/08 her pain has worsen remarkably , limiting her daily functioning.  Unfortunately I'm waiting for the med recs to be faxed to me from Dr. Elesa Massed office. Therefore I don't have more informatioin. We will fax them to you ASAP.   Procedures Requested:   Current Medical Problems:  2)  BACK PAIN, CHRONIC (ICD-724.5) 3)  TOBACCO DEPENDENCE (ICD-305.1) 4)  PEPTIC ULCER DIS., UNSPEC. W/O OBSTRUCTION (ICD-533.90) 5)  OSTEOARTHRITIS, LOWER LEG (ICD-715.96) 6)  ONYCHOMYCOSIS (ICD-110.1) 7)  MENSTRUATION, PAINFUL (ICD-625.3) 8)  IRRITABLE BOWEL SYNDROME (ICD-564.1) 9)  HYPERTENSION, BENIGN SYSTEMIC (ICD-401.1) 10)  HYPERCHOLESTEROLEMIA (ICD-272.0) 11)  GLAUCOMA (ICD-365.9) 12)  ECZEMA, ATOPIC DERMATITIS (ICD-691.8) 13)  DEPRESSION, MAJOR, RECURRENT (ICD-296.30) 14)  COPD (ICD-496)   Current Medications:   Past Medical History: 1)  [id number], retinitis pigmetosa, Sinusitis   Family History: 1)  F- died unknown causes, GF-CAD, GM-DM, htn, M-htn, DM, mom with early menopause, no h/o breat/colon ca, u x2-CAD    Social History: 1)  Smoker-20 pack year hx (3-4 ppd); occas EtOH; married with 4 kids   Pertinent Labs:    Thank you again for agreeing to see our patient; please contact us if you have any further questions or need additional information.  Sincerely,  [facility] Antonieta Slaven  Appended Document: *Referral Letter records faxed to [facility] for a referral. also release of record information give to Carmelia Bake to send to [facility] to have records sent here and also to [facility].

## 2010-04-13 NOTE — Miscellaneous (Signed)
Summary: Refills Metoprolol, Diovan  Clinical Lists Changes  Medications: Changed medication from DIOVAN HCT 160-12.5 MG TABS (VALSARTAN-HYDROCHLOROTHIAZIDE) Take 1 tablet by mouth once a day to DIOVAN HCT 160-12.5 MG TABS (VALSARTAN-HYDROCHLOROTHIAZIDE) Take 1 tablet by mouth once a day - Signed Changed medication from METOPROLOL SUCCINATE 200 MG TB24 (METOPROLOL SUCCINATE) Take 1 tablet by mouth once a day to METOPROLOL TARTRATE 100 MG  TABS (METOPROLOL TARTRATE) 2 tabs by mouth bid - Signed Rx of DIOVAN HCT 160-12.5 MG TABS (VALSARTAN-HYDROCHLOROTHIAZIDE) Take 1 tablet by mouth once a day;  #30 x 11;  Signed;  Entered by: Romero Belling MD;  Authorized by: Romero Belling MD;  Method used: Electronic Rx of METOPROLOL TARTRATE 100 MG  TABS (METOPROLOL TARTRATE) 2 tabs by mouth bid;  #120 x 11;  Signed;  Entered by: Romero Belling MD;  Authorized by: Romero Belling MD;  Method used: Electronic    Prescriptions: METOPROLOL TARTRATE 100 MG  TABS (METOPROLOL TARTRATE) 2 tabs by mouth bid  #120 x 11   Entered and Authorized by:   Romero Belling MD   Signed by:   Romero Belling MD on 10/16/2007   Method used:   Electronically sent to ...       [facility] [address]       [address]       [place], [place]  [address]       Ph: [phone]       Fax: [phone]   RxID:   [id number] DIOVAN HCT 160-12.5 MG TABS (VALSARTAN-HYDROCHLOROTHIAZIDE) Take 1 tablet by mouth once a day  #30 x 11   Entered and Authorized by:   Romero Belling MD   Signed by:   Romero Belling MD on 10/16/2007   Method used:   Electronically sent to ...       [facility] [address]       [address]       [place], [place]  [address]       Ph: [phone]       Fax: [phone]   RxID:   [id number]

## 2010-04-13 NOTE — Miscellaneous (Signed)
Summary: DNKA               

## 2010-04-13 NOTE — Letter (Signed)
Summary: Out of Work  [facility]  [address] [place], [place] [address]   Phone: [phone]  Fax: [phone]    April 17, 2009   Employee:  VONZELLA ALTHAUS [employer]    To Whom It May Concern:   For Medical reasons, please excuse the above named employee from work for the following dates:  Start:   04-17-09 Allisson Schindel Chandler-Kohlmann saw Amy Esterwood PA-C in the office.  End:   04-20-09 Jessina Marse Chandler-Zahner is having a procedure with Dr. Marina Goodell.  If you need additional information, please feel free to contact our office.         Sincerely,

## 2010-04-13 NOTE — Consult Note (Signed)
Summary: [facility]   Imported By: Clydell Hakim 11/27/2008 11:57:57  _____________________________________________________________________  External Attachment:    Type:   Image     Comment:   External Document

## 2010-04-13 NOTE — Miscellaneous (Signed)
Summary: ct approved  Clinical Lists Changes ct pelvis & abd approved by [facility] #[id number].Golden Circle RN  January 14, 2009 4:13 PM

## 2010-04-13 NOTE — Letter (Signed)
Summary: Pulmonology Referral  [facility]  [address]   [place], [place] [address]   Phone: [phone]  Fax: [phone]    02/12/2008  Thank you in advance for agreeing to see my patient:  Theresa [name] [address] [place], [place]  [address] Phone: [phone]  Reason for Referral: Evaluate for LEFT apical spiculated pulmonary mass with marked hypermetabolic activity on PET scan (02/12/2008) suspicious for bronchogenic carcinoma.  Procedures Requested:  Biopsy of lesion.  Current Medical Problems: 1)  PULMONARY NODULE (ICD-518.89) 2)  CHEST PAIN (ICD-786.50) 3)  URINARY FREQUENCY, CHRONIC (ICD-788.41) 4)  CONTACT OR EXPOSURE TO OTHER VIRAL DISEASES (ICD-V01.79) 5)  ALLERGIC RHINITIS (ICD-477.9) 6)  LEG PAIN, BILATERAL (ICD-729.5) 7)  CONSTIPATION, INTERMITTENT (ICD-564.00) 8)  ABDOMINAL PAIN, RIGHT UPPER QUADRANT (ICD-789.01) 9)  NAUSEA AND VOMITING (ICD-787.01) 10)  WEIGHT GAIN (ICD-783.1) 11)  DIABETES-TYPE 2 (ICD-250.00) 12)  AODM (ICD-250.00) 13)  POLYURIA (ICD-788.42) 14)  DYSURIA (ICD-788.1) 15)  SYMPTOM, DISTURBANCE OF SKIN SENSATION (ICD-782.0) 16)  HEADACHE, REBOUND (ICD-784.0) 17)  MIGRAINE, 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 W/O OBSTRUCTION (ICD-533.90) 21)  OSTEOARTHRITIS, LOWER LEG (ICD-715.96) 22)  ONYCHOMYCOSIS (ICD-110.1) 23)  MENSTRUATION, PAINFUL (ICD-625.3) 24)  IRRITABLE BOWEL SYNDROME (ICD-564.1) 25)  HYPERTENSION, BENIGN SYSTEMIC (ICD-401.1) 26)  HYPERCHOLESTEROLEMIA (ICD-272.0) 27)  GLAUCOMA (ICD-365.9) 28)  ECZEMA, ATOPIC DERMATITIS (ICD-691.8) 29)  DEPRESSION, MAJOR, RECURRENT (ICD-296.30) 30)  COPD (ICD-496)  Current Medications: 1)  ADVAIR DISKUS 500-50 MCG/DOSE MISC (FLUTICASONE-SALMETEROL) Inhale 1 puff as directed twice a day 2)  VENTOLIN HFA 108 (90 BASE) MCG/ACT AERS (ALBUTEROL  SULFATE) Inhale 2 puffs q4h as directed 3)  DIOVAN HCT 160-12.5 MG TABS (VALSARTAN-HYDROCHLOROTHIAZIDE) Take 1 tablet by mouth once a day 4)  METOPROLOL TARTRATE 100 MG  TABS (METOPROLOL TARTRATE) 2 tabs by mouth bid 5)  ZYRTEC ALLERGY 10 MG TABS (CETIRIZINE HCL) Take 1 tablet by mouth once a day 6)  CADUET 10-10 MG TABS (AMLODIPINE-ATORVASTATIN) 1 tablet a day for blood pressure and cholesterol 7)  MIDRIN 325-65-100 MG CAPS (APAP-ISOMETHEPTENE-DICHLORAL) take 2 tab at onset of headache, may repeat with 1 tablet every 1 up to 5 tablet in 12 hrs. 8)  FLUOCINONIDE 0.05 %  CREA (FLUOCINONIDE) appy to affected area twice a day. disp 60 gram tube 9)  METFORMIN HCL 500 MG TABS (METFORMIN HCL) take 1 tablet in AM for 1 week, if no GI problems, then increase to 1 tablet twice a day. 10)  * MEDTRONIC GLUCOMETER, STRIPS, AND LANCETS needing 1 glucometer, strips #60, lancets #60 disp QS 11)  PANTOPRAZOLE SODIUM 40 MG TBEC (PANTOPRAZOLE SODIUM) 1 tab by mouth daily  Past Medical History: 1)  [id number],  2)  retinitis pigmetosa, 3)   Sinusitis 4)  depression 5)  obesity 6)  low back pain 7)  disk herniation 8)  migraines 9)  DM TYPE II- 11/08- new dx      Pertinent Labs: PET Scan (02/12/2008) -- left apical lung mass with hypermetabolic activity suspicious for bronchogenic carcinoma. CT Angiogram (02/07/2008) -- 2.5 soft tissue mass in left apex with spiculated peripheral margins.  Thank you again for agreeing to see our patient; please contact us if you have any further questions or need additional information.  Sincerely,   Romero Belling MD

## 2010-04-13 NOTE — Assessment & Plan Note (Signed)
Summary: Tobacco Cessation - Rx Clinic   Vital Signs:  Patient profile:   45 year old female Weight:      226 pounds BMI:     40.18 Pulse rate:   73 / minute BP sitting:   166 / 94  (right arm)  Primary Care Provider:  Romero Belling, MD   History of Present Illness: Theresa [name] reports to Rx clinic for smoking cessation counseling. She complains of a headache that starts at the top of the head and radiates down to the back of her neck. It is not relieved by ibuprofen. She is down to 4 cigarettes per day but is feeling emabarassed that she has started again.  Hypertension History:      She complains of headache.        Positive major cardiovascular risk factors include diabetes, hyperlipidemia, hypertension, and current tobacco user.  Negative major cardiovascular risk factors include female age less than 60 years old.    Habits & Providers  Alcohol-Tobacco-Diet     Tobacco Status: current     Pack years: 27  Current Medications (verified): 1)  Metoprolol Tartrate 100 Mg  Tabs (Metoprolol Tartrate) .... 2 Tabs By Mouth Two Times A Day 2)  Diovan Hct 160-12.5 Mg Tabs (Valsartan-Hydrochlorothiazide) .... Take 1 Pill By Mouth Once Daily 3)  Premarin 0.3 Mg Tabs (Estrogens Conjugated) .... Take 1 Pill By Mouth Once Daily 4)  Carafate 1 Gm/61ml Susp (Sucralfate) .... Take 1 Gram 4 Times Daily, Between Meals and At Bedtime. 5)  Ventolin Hfa 108 (90 Base) Mcg/act Aers (Albuterol Sulfate) .... 2 Puffs Inhaled Every 4 Hours As Needed For Cough, Wheeze, Shortness of Breath 6)  Chantix 0.5 Mg Tabs (Varenicline Tartrate) .... Take One Daily For 7 Days Then Increase To Two Times A Day With Meals. 7)  Fluticasone Propionate 50 Mcg/act Susp (Fluticasone Propionate) .Marland Kitchen.. 1 Spray Each Nostril Daily 8)  Hydroxyzine Hcl 25 Mg Tabs (Hydroxyzine Hcl) .... Take 1-2 Tables Every 6 Hours As Needed For Itching 9)  Betamethasone Dipropionate 0.05 % Oint (Betamethasone Dipropionate) .... Apply To Affected  Area Three Times A Day. Dispense 15 Gm Tube 10)  Amlodipine Besylate 10 Mg Tabs (Amlodipine Besylate) .... Once Daily  Allergies (verified): 1)  ! Codeine 2)  ! Penicillin  Social History: Smoking Status:  current   Impression & Recommendations:  Problem # 1:  TOBACCO DEPENDENCE (ICD-305.1)  Mild  Nicotine Abuse of: on and off for 29 years duration in a patient who is: good candidate for success b/c of: previous quit attempts and still strong desire to quit. Patient had previous success with varenicline and would like to try again but stated it made her agitated. Initiated varenicline 0.5 mg po daily for 7 days then increase to 0.5 mg twice daily, goal to start low and titrate slowly due to previous effects.  Counseled on purpose, proper use, and potential adverse effects, including nausea.  Cautioned regarding CNS effects.  Advised to call office if any new mood changes or suicidal ideation.  F/U visit with Dr. Lelon Perla in 3-4 weeks Total time with patient in face-to-face counseling:  30  minutes.  Patient seen with:Theresa [name]   Her updated medication list for this problem includes:    Chantix 0.5 Mg Tabs (Varenicline tartrate) .Marland Kitchen... Take one daily for 7 days then increase to two times a day with meals.  Orders: Reassessment Each 15 min unit- [facility] (13086)  Problem # 2:  HYPERLIPIDEMIA (ICD-272.4) The patient  was unable to afford caduet which is the combination of amlodipine with simvastatin and stopped taking it a few months ago. Will check lipid panel today and have her follow up with Dr. Lelon Perla. The following medications were removed from the medication list:    Caduet 10-10 Mg Tabs (Amlodipine-atorvastatin) .Marland Kitchen... 1 tablet a day for blood pressure and cholesterol  Orders: Lipid-[facility] ([address]-60454) Comp Met-[facility] (09811-91478)  Problem # 3:  HYPERTENSION (ICD-401.9) Blood pressure today 166/94. Patient couldn't afford caduet so she stopped taking it. Complaining of  headache today. Restarted amlodipine 10 mg daily (1/2 of the compenent in caduet). Encouraged her to check her blood pressure at home.  The following medications were removed from the medication list:    Caduet 10-10 Mg Tabs (Amlodipine-atorvastatin) .Marland Kitchen... 1 tablet a day for blood pressure and cholesterol Her updated medication list for this problem includes:    Metoprolol Tartrate 100 Mg Tabs (Metoprolol tartrate) .Marland Kitchen... 2 tabs by mouth two times a day    Diovan Hct 160-12.5 Mg Tabs (Valsartan-hydrochlorothiazide) .Marland Kitchen... Take 1 pill by mouth once daily    Amlodipine Besylate 10 Mg Tabs (Amlodipine besylate) ..... Once daily  Orders: Comp Met-[facility] (29562-13086)  Problem # 4:  DIABETES-TYPE 2 (ICD-250.00)  A1C obtained  today. f/u with Dr. Lelon Perla.  Her updated medication list for this problem includes:    Diovan Hct 160-12.5 Mg Tabs (Valsartan-hydrochlorothiazide) .Marland Kitchen... Take 1 pill by mouth once daily  Orders: Reassessment Each 15 min unit- [facility] (57846) A1C-[facility] (96295)  Complete Medication List: 1)  Metoprolol Tartrate 100 Mg Tabs (Metoprolol tartrate) .... 2 tabs by mouth two times a day 2)  Diovan Hct 160-12.5 Mg Tabs (Valsartan-hydrochlorothiazide) .... Take 1 pill by mouth once daily 3)  Premarin 0.3 Mg Tabs (Estrogens conjugated) .... Take 1 pill by mouth once daily 4)  Carafate 1 Gm/32ml Susp (Sucralfate) .... Take 1 gram 4 times daily, between meals and at bedtime. 5)  Ventolin Hfa 108 (90 Base) Mcg/act Aers (Albuterol sulfate) .... 2 puffs inhaled every 4 hours as needed for cough, wheeze, shortness of breath 6)  Chantix 0.5 Mg Tabs (Varenicline tartrate) .... Take one daily for 7 days then increase to two times a day with meals. 7)  Fluticasone Propionate 50 Mcg/act Susp (Fluticasone propionate) .Marland Kitchen.. 1 spray each nostril daily 8)  Hydroxyzine Hcl 25 Mg Tabs (Hydroxyzine hcl) .... Take 1-2 tables every 6 hours as needed for itching 9)  Betamethasone Dipropionate 0.05 % Oint  (Betamethasone dipropionate) .... Apply to affected area three times a day. dispense 15 gm tube 10)  Amlodipine Besylate 10 Mg Tabs (Amlodipine besylate) .... Once daily  Hypertension Assessment/Plan:      The patient's hypertensive risk group is category C: Target organ damage and/or diabetes.  Her calculated 10 year risk of coronary heart disease is > 32 %.  Today's blood pressure is 166/94.     Patient Instructions: 1)  Thank you for visiting with [place]! 2)  we have restarted your blood pressure medicine, amlodipine which was one of the medicines in caduet to control your blood pressure. take chantix 0.5 mg for 7 days with meals, then increase to 0.5 mg twice daily.  3)  Stop smoking when you go on your cruise on 11/10/09 4)  continue exercising  5)  follow up with Dr. Lelon Perla with your cholestrol in 3 to 4 weeks. We are checking a cholestrol panel today. Prescriptions: AMLODIPINE BESYLATE 10 MG TABS (AMLODIPINE BESYLATE) once daily  #30 x 3  Entered by:   Christian Mate D   Authorized by:   Angelena Sole MD   Signed by:   Madelon Lips Pharm D on 11/06/2009   Method used:   Electronically to        [facility] [address] (retail)       [address] [place], [place]  [address]       Ph: [phone]       Fax: [phone]   RxID:   [id number] BETAMETHASONE DIPROPIONATE 0.05 % OINT (BETAMETHASONE DIPROPIONATE) apply to affected area three times a day. Dispense 15 gm tube  #1 x 2   Entered by:   Christian Mate D   Authorized by:   Angelena Sole MD   Signed by:   Madelon Lips Pharm D on 11/06/2009   Method used:   Electronically to        [facility] [address] (retail)       [address] [place], [place]  [address]       Ph: [phone]       Fax: [phone]   RxID:   [id number] DIOVAN HCT 160-12.5 MG TABS (VALSARTAN-HYDROCHLOROTHIAZIDE) take 1 pill by mouth once daily  #30 x 5   Entered by:    Christian Mate D   Authorized by:   Angelena Sole MD   Signed by:   Madelon Lips Pharm D on 11/06/2009   Method used:   Electronically to        [facility] [address] (retail)       [address]       [place], [place]  [address]       Ph: [phone]       Fax: [phone]   RxID:   [id number] HYDROXYZINE HCL 25 MG TABS (HYDROXYZINE HCL) Take 1-2 tables every 6 hours as needed for itching  #60 x 5   Entered by:   Christian Mate D   Authorized by:   Angelena Sole MD   Signed by:   Madelon Lips Pharm D on 11/06/2009   Method used:   Electronically to        [facility] [address] (retail)       [address] [place], [place]  [address]       Ph: [phone]       Fax: [phone]   RxID:   [id number] FLUTICASONE PROPIONATE 50 MCG/ACT SUSP (FLUTICASONE PROPIONATE) 1 spray each nostril daily  #1 x 5   Entered by:   Christian Mate D   Authorized by:   Angelena Sole MD   Signed by:   Madelon Lips Pharm D on 11/06/2009   Method used:   Electronically to        [facility] [address] (retail)       [address] [place], [place]  [address]       Ph: [phone]       Fax: [phone]   RxID:   [id number] CHANTIX 0.5 MG TABS (VARENICLINE TARTRATE) take one daily  for 7 days then increase to two times a day with meals.  #60 x 2   Entered and Authorized by:   Christian Mate D   Signed by:   Madelon Lips Pharm D on 11/06/2009   Method used:   Telephoned to ...       [facility] [address] (retail)       [address] [place], [place]  [address]       Ph: [phone]       Fax: [phone]   RxID:   [id number]   Prevention & Chronic Care Immunizations   Influenza vaccine: Not documented    Tetanus booster: 04/14/2002: Done.   Tetanus booster due: 04/14/2012    Pneumococcal vaccine: Not documented  Other Screening   Pap  smear: Done.  (10/13/2002)   Pap smear due: 10/13/2003    Mammogram: Not documented   Smoking status: current  (11/06/2009)   Smoking cessation counseling: YES  (10/21/2008)   Target quit date: 11/10/2009  (11/06/2009)  Diabetes Mellitus   HgbA1C: 5.9   (01/13/2009)   Hemoglobin A1C due: 07/15/2008    Eye exam: Not documented    Foot exam: yes  (10/21/2008)   High risk foot: Not documented   Foot care education: Not documented    Urine microalbumin/creatinine ratio: Not documented   Urine microalbumin/cr due: 02/01/2008    Diabetes flowsheet reviewed?: Yes   Progress toward A1C goal: At goal  Lipids   Total Cholesterol: 158  (02/06/2008)   LDL: 113  (02/06/2008)   LDL Direct: Not documented   HDL: 31  (02/06/2008)   Triglycerides: 71  (02/06/2008)    SGOT (AST): 22  (04/17/2009)   SGPT (ALT): 29  (04/17/2009) CMP ordered    Alkaline phosphatase: 51  (04/17/2009)   Total bilirubin: 0.9  (04/17/2009)    Lipid flowsheet reviewed?: Yes   Progress toward LDL goal: Unchanged  Hypertension   Last Blood Pressure: 166 / 94  (11/06/2009)   Serum creatinine: 1.2  (04/17/2009)   Serum potassium 3.2  (04/17/2009) CMP ordered     Hypertension flowsheet reviewed?: Yes   Progress toward BP goal: Deteriorated  Self-Management Support :   Personal Goals (by the next clinic visit) :     Personal A1C goal: 8  (11/06/2009)     Personal blood pressure goal: 130/80  (11/06/2009)     Personal LDL goal: 100  (11/06/2009)    Diabetes self-management support: Not documented    Hypertension self-management support: Not documented    Lipid self-management support: Not documented    Tobacco Counseling   Currently uses tobacco.    Cigarettes      Year started smoking cigarettes:      1980     Number of packs per day smoked:      2     Years smoked:            29     Packs per year:          730     Pack Years:            62  Cessation Stage:     Action Target Quit Date:      11/10/2009  Quitting Barriers:      -  stress     -  obesity     -  fear of weight gain     -  feels addicted  Quitting Motivators:      -  poor health     -  dyspnea     -  cerebrovascular disease     -  coronary disease     -  healthier lifestyle  Comments: Brand smoked:  [place] 100's   Nicotine Content per Cigarette (mg):     ~1mg  Down to smoking 4 cigarettes per day. Smokes the first one about 4 hours after waking. Doesn't smoke in the middle of the night.  Patient reports IMPORTANCE  to quit on 1-10 scale of:10  Patient reports READINESS to quit on a scale of 0-10 of:10          Previous Quit Attempts   Previously Tried to quit:     Yes # of Previous quit attempts:     10 Longest Successful Quit Period:   2 months  Quit Methods Tried:      -  cold [place]     -  nicotine gum     -  Chantix  Reason for restarting:      -  stress  Readiness:     Very Ready Cessation Stage:   Action Target Quit Date:   11/10/2009  Counseled on:      -  stress avoidance     -  exercise  New Medications: DIOVAN HCT 160-12.5 MG TABS (VALSARTAN-HYDROCHLOROTHIAZIDE) take 1 pill by mouth once daily CHANTIX 0.5 MG TABS (VARENICLINE TARTRATE) take one daily for 7 days then increase to two times a day with meals. FLUTICASONE PROPIONATE 50 MCG/ACT SUSP (FLUTICASONE PROPIONATE) 1 spray each nostril daily HYDROXYZINE HCL 25 MG TABS (HYDROXYZINE HCL) Take 1-2 tables every 6 hours as needed for itching BETAMETHASONE DIPROPIONATE 0.05 % OINT (BETAMETHASONE DIPROPIONATE) apply to affected area three times a day. Dispense 15 gm tube AMLODIPINE BESYLATE 10 MG TABS (AMLODIPINE BESYLATE) once daily  New Problems: TOBACCO DEPENDENCE (ICD-305.1)  Medications Added this Update:  DIOVAN HCT 160-12.5 MG TABS (VALSARTAN-HYDROCHLOROTHIAZIDE) take 1 pill by mouth once daily CHANTIX 0.5 MG TABS (VARENICLINE TARTRATE) take one daily for 7 days then increase to two times a day with meals. FLUTICASONE  PROPIONATE 50 MCG/ACT SUSP (FLUTICASONE PROPIONATE) 1 spray each nostril daily HYDROXYZINE HCL 25 MG TABS (HYDROXYZINE HCL) Take 1-2 tables every 6 hours as needed for itching BETAMETHASONE DIPROPIONATE 0.05 % OINT (BETAMETHASONE DIPROPIONATE) apply to affected area three times a day. Dispense 15 gm tube AMLODIPINE BESYLATE 10 MG TABS (AMLODIPINE BESYLATE) once daily  Orders Added this Update:  Lipid-[facility] [80061-22930] Comp Met-[facility] [80053-22900] Reassessment Each 15 min unit- [facility] [97803] A1C-[facility] [83036]

## 2010-04-13 NOTE — Assessment & Plan Note (Signed)
Summary: rov for discussion of TTNA results.   PCP:  Romero Belling MD  Chief Complaint:  Pt is here for a f/u appt to discuss biopsy results.  Pt c/o decreased appetitie, increased sob, and and  pain on Left side of  chest. Pt states she rates pain 9 out of 10.  Marland Kitchen  History of Present Illness: the pt comes in today for f/u after her recent TTNA of LUL mass.  This was positive for adenocarcinoma, and the immunohistochemistry was c/w primary bronchogenic carcinoma.  I have discussed results with the pt and her family, and answered all of their questions.     Current Allergies: No known allergies       Vital Signs:  Patient Profile:   45 Years Old Female Height:     62 inches Weight:      223.13 pounds O2 Sat:      96 % O2 treatment:    Room Air Temp:     98.3 degrees F oral Pulse rate:   74 / minute BP sitting:   124 / 72  (left arm) Cuff size:   regular  Vitals Entered By: Arman Filter LPN (February 28, 2008 1:36 PM)             Comments Medications reviewed with patient Arman Filter LPN  February 28, 2008 1:36 PM      Physical Exam  General:     obese female in nad      Impression & Recommendations:  Problem # 1:  ADENOCARCINOMA, LUNG, UPPER LOBE, 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  She will need surgical evaluation for possible lobectomy, as well as pfts for evaluation of resectability.   Patient Instructions: 1)  will schedule for breathing studies 2)  will get apptm with Dr. Edwyna Shell for chest surgery   ]

## 2010-04-13 NOTE — Letter (Signed)
Summary: EGD Instructions  [facility]  [address] [place], [place] [address]   Phone: [phone]  Fax: [phone]       Theresa [name]    [birth year]    MRN: [id number]       Procedure Day /Date:04-20-09     Arrival Time: 8:00 AM     Procedure Time :9:00 AM     Location of Procedure:                    [facility] ([facility]th [facility])   PREPARATION FOR ENDOSCOPY   On  04-20-09 THE DAY OF THE PROCEDURE:  1.   No solid foods, milk or milk products are allowed after midnight the night before your procedure.  2.   Do not drink anything colored red or purple.  Avoid juices with pulp.  No orange juice.  3.  You may drink clear liquids until  7:00 AM, which is 2 hours before your procedure.                                                                                                CLEAR LIQUIDS INCLUDE: Water Jello Ice Popsicles Tea (sugar ok, no milk/cream) Powdered fruit flavored drinks Coffee (sugar ok, no milk/cream) Gatorade Juice: apple, white grape, white cranberry  Lemonade Clear bullion, consomm, broth Carbonated beverages (any kind) Strained chicken noodle soup Hard Candy   MEDICATION INSTRUCTIONS  Unless otherwise instructed, you should take regular prescription medications with a small sip of water as early as possible the morning of your procedure.        OTHER INSTRUCTIONS  You will need a responsible adult at least 47 years of age to accompany you and drive you home.   This person must remain in the waiting room during your procedure.  Wear loose fitting clothing that is easily removed.  Leave jewelry and other valuables at home.  However, you may wish to bring a book to read or an iPod/MP3 player to listen to music as you wait for your procedure to start.  Remove all body piercing jewelry and leave at home.  Total time from sign-in until discharge is approximately 2-3 hours.  You should go home directly after  your procedure and rest.  You can resume normal activities the day after your procedure.  The day of your procedure you should not:   Drive   Make legal decisions   Operate machinery   Drink alcohol   Return to work  You will receive specific instructions about eating, activities and medications before you leave.    The above instructions have been reviewed and explained to me by   _______________________    I fully understand and can verbalize these instructions _____________________________ Date _________
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
## 2010-04-13 NOTE — Letter (Signed)
Summary: Out of Work  [facility]  [address]   [place], [place] [address]   Phone: [phone]  Fax: [phone]    February 01, 2007   Employee:  [employer]    To Whom It May Concern:   For Medical reasons, please excuse the above named employee from work for the following dates:  Start:   02/01/07  End:   02/01/07  If you need additional information, please feel free to contact our office.         Sincerely,    Quinesha Selinger  MD

## 2010-04-13 NOTE — Letter (Signed)
Summary: Diovan Denial  [facility]  [address]   [place], [place] [address]   Phone: [phone]  Fax: [phone]    02/14/2008  [facility] Prescription Benefits Management Prior Authorization Department [address] [place], [place]  [address] PHONE:  [phone] FAX:  [phone]  RE: Diovan prescription denial for patient Theresa [name] (DOB Jun 26, 1965)  Upon further review of her paper chart, I found documentation of Diovan side effects described by her previous physician on 05/12/2005.  At that time, Ms. Ave Filter was complaining of "massive headache" which was attributed to her lisinopril (an ACE inhibitor) and which subsequently resolved after switching her to Diovan (valsartan/HCTZ).  We have finally achieved reasonable blood pressure control with this agent, and I feel at this time that this is a medically necessary medication for her.  Please reconsider your denial of authorization for this medication from 02/14/2008.  Regards,   Romero Belling MD [facility]  Appended Document: Diovan Denial Received response -- again denied!

## 2010-04-13 NOTE — Miscellaneous (Signed)
Summary: med refills  Clinical Lists Changes  Medications: Added new medication of FLUOCINONIDE-E 0.05 %  CREA (FLUOCINONIDE EMULSIFIED BASE) apply small amt twice daily as needed.  disp 60gram cream. - Signed Rx of FLUOCINONIDE-E 0.05 %  CREA (FLUOCINONIDE EMULSIFIED BASE) apply small amt twice daily as needed.  disp 60gram cream.;  #1 x 1;  Signed;  Entered by: Wilhemina Bonito  MD;  Authorized by: Wilhemina Bonito  MD;  Method used: Electronic    Prescriptions: FLUOCINONIDE-E 0.05 %  CREA (FLUOCINONIDE EMULSIFIED BASE) apply small amt twice daily as needed.  disp 60gram cream.  #1 x 1   Entered and Authorized by:   Wilhemina Bonito  MD   Signed by:   Wilhemina Bonito  MD on 12/15/2006   Method used:   Electronically sent to ...       [facility] [address]*       [address] [place], [place]  [address]       Ph: [phone]       Fax: [phone]   RxID:   [id number]

## 2010-04-13 NOTE — Progress Notes (Signed)
Summary: refill  Phone Note Refill Request Call back at Home Phone [phone] Message from:  Patient  Refills Requested: Medication #1:  DIOVAN HCT 160-12.5 MG TABS take 1 pill by mouth once daily  Medication #2:  METOPROLOL TARTRATE 100 MG  TABS 2 tabs by mouth two times a day  Medication #3:  HYDROXYZINE HCL 25 MG TABS Take 1-2 tables every 6 hours as needed for itching pt needs this today  Initial call taken by: De Nurse,  November 06, 2009 3:54 PM    Prescriptions: HYDROXYZINE HCL 25 MG TABS (HYDROXYZINE HCL) Take 1-2 tables every 6 hours as needed for itching  #60 x 5   Entered and Authorized by:   Angelena Sole MD   Signed by:   Angelena Sole MD on 11/06/2009   Method used:   Electronically to        [facility] [address] (retail)       [address] [place], [place]  [address]       Ph: [phone]       Fax: [phone]   RxID:   [id number] DIOVAN HCT 160-12.5 MG TABS (VALSARTAN-HYDROCHLOROTHIAZIDE) take 1 pill by mouth once daily  #30 x 5   Entered and Authorized by:   Angelena Sole MD   Signed by:   Angelena Sole MD on 11/06/2009   Method used:   Electronically to        [facility] [address] (retail)       [address]       [place], [place]  [address]       Ph: [phone]       Fax: [phone]   RxID:   [id number] METOPROLOL TARTRATE 100 MG  TABS (METOPROLOL TARTRATE) 2 tabs by mouth two times a day  #120 x 3   Entered and Authorized by:   Angelena Sole MD   Signed by:   Angelena Sole MD on 11/06/2009   Method used:   Electronically to        [facility] [address] (retail)       [address] [place], [place]  [address]       Ph: [phone]       Fax: [phone]   RxID:   [id number]

## 2010-04-13 NOTE — Progress Notes (Signed)
Summary: triage  Phone Note From Other Clinic Call back at [phone]  or  [phone]   Caller: Larita Fife, nurse with [facility] Call For: Dr. Jarold Motto, doc of the day Reason for Call: Schedule Patient Appt Summary of Call: Dr. Pearlean Brownie would like pt seen within next 1-2 weeks for epigastic pain... next available is in 3 weeks... ok to see Amy or Gunnar Fusi... no GI hx Initial call taken by: Vallarie Mare,  April 09, 2009 9:24 AM  Follow-up for Phone Call        Pt has been having abd pain,  melana, Hx PUD.  Appt sch with Mike Gip, PA on 04/13/09.  Lynn notified.   Follow-up by: Ashok Cordia RN,  April 09, 2009 3:40 PM

## 2010-04-13 NOTE — Letter (Signed)
Summary: CT abdomen/pelvis -- wnl  [facility]  [address]   [place], [place] [address]   Phone: [phone]  Fax: [phone]    01/15/2009  Theresa [name] [address]  [address] [place], [place]  [address]  Dear [name],  I have reviewed the report of your CT scan and it was completely normal.  Sincerely, Romero Belling MD  Appended Document: CT abdomen/pelvis -- wnl mailed.

## 2010-04-13 NOTE — Assessment & Plan Note (Signed)
Summary: ulcer wp   Vital Signs:  Patient Profile:   45 Years Old Female Height:     62 inches Weight:      217.3 pounds Pulse rate:   76 / minute BP sitting:   145 / 86  (right arm)  Pt. in pain?   yes    Location:   stomach    Intensity:   10  Vitals Entered By: Arlyss Repress CMA, (August 14, 2007 8:36 AM)                  PCP:  Wilhemina Bonito  MD  Chief Complaint:  stomach pain x 1 month.  History of Present Illness: Hx DMT2 (non-insulin), HTN, migraine, hx PUD  per pt  Presents for   Epigastric pain - several mos duration.  States has hx of PUD as teen from stress.  Admits constipation but no melena or hematochezia.  Pt with well controlled DM last A1C 6.1 05/2007 and had nml abdominal ultrasound.  States some non-bilious emesis occassionally.            Physical Exam  General:     Well-developed,well-nourished,in no acute distress; alert,appropriate and cooperative throughout examination Lungs:     Normal respiratory effort, chest expands symmetrically. Lungs are clear to auscultation, no crackles or wheezes. Heart:     Normal rate and regular rhythm. S1 and S2 normal without gallop, murmur, click, rub or other extra sounds. Abdomen:     Bowel sounds positive,abdomen soft and non-tender without masses, organomegaly or hernias noted.  No guarding or rebound.      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 pylori positive in 05/2007.  No sxs of obstruction on hx or exam.  Will treat with 2 week trial.  Protonix 40 two times a day, amox 1 gram , clarithromycin 500 x 14 days to asst with compliance.  We do not offer EIA stool antigen for dx or test of cure.  Will order CBC to check hemoglobin for GIB.  Nml U/S and CMET last visit.  If no improvement p may benefit GI referral.  Counselled to avoid NSAID.  I doubt her  metformin is contributing since she has been on it for some time.   Her updated medication list for this problem includes:    Protonix 40 Mg Tbec (Pantoprazole sodium) .Marland Kitchen... Take 1 tablet by two times a day x 14 days  Orders: [facility]- Est  Level 4 ([address])   Complete Medication List: 1)  Advair Diskus 500-50 Mcg/dose Misc (Fluticasone-salmeterol) .... Inhale 1 puff as directed twice a day 2)  Albuterol 90 Mcg/act Aers (Albuterol) .... Inhale 2 puff as directed every four hours 3)  Diovan Hct 160-12.5 Mg Tabs (Valsartan-hydrochlorothiazide) .... Take 1 tablet by mouth once a day 4)  Metoprolol Succinate 200 Mg Tb24 (Metoprolol succinate) .... Take 1 tablet by mouth once a day 5)  Protonix 40 Mg Tbec (Pantoprazole sodium) .... Take 1 tablet by two times a day x 14 days 6)  Sertraline Hcl 100 Mg Tabs (Sertraline hcl) .... Take 1 1/2 tablet by mouth once a day 7)  Spiriva Handihaler 18 Mcg Caps (Tiotropium bromide monohydrate) .... Inhale 1 capsule as directed once a day 8)  Zyrtec Allergy 10 Mg Tabs (Cetirizine hcl) .... Take 1 tablet by mouth once a day 9)  Caduet 10-10 Mg Tabs (  Amlodipine-atorvastatin) .Marland Kitchen.. 1 tablet a day for blood pressure and cholesterol 10)  Metrogel 1 % Gel (Metronidazole) .Marland Kitchen.. 1 applicator full into vagina x 5 days, use 1 time daily. 11)  Midrin 325-65-100 Mg Caps (Apap-isometheptene-dichloral) .... Take 2 tab at onset of headache, may repeat with 1 tablet every 1 up to 5 tablet in 12 hrs. 12)  Fluocinonide 0.05 % Crea (Fluocinonide) .... Appy to affected area twice a day. disp 60 gram tube 13)  Flexeril 10 Mg Tabs (Cyclobenzaprine hcl) .Marland Kitchen.. 1 tablet by mouth at bedtime 14)  Metformin Hcl 500 Mg Tabs (Metformin hcl) .... Take 1 tablet in am for 1 week, if no gi problems, then increase to 1 tablet twice a day. 15)  Medtronic Glucometer, Strips, and Lancets  .... Needing 1 glucometer, strips #60, lancets #60 disp qs 16)  Fleet Mineral Oil Enem (Mineral oil) .... Use per rectum  for relief of constipation.  may repeat in 8 hours if bowel movement does not result. 17)  Miralax Powd (Polyethylene glycol 3350) .Marland Kitchen.. 17g dissolved in 8oz of water daily for relief of constipation.  disp q.s. 30 days 18)  Benefiber Powd (Wheat dextrin) .... Use as directed to increase daily fiber intake. 19)  Darvocet-n 100 100-650 Mg Tabs (Propoxyphene n-apap) .Marland Kitchen.. 1 tablet every 4 hours for pain, not to exceed 6 tablets in 1 day. 20)  Amoxicillin 500 Mg Tabs (Amoxicillin) .... 2 tas by mouth daily x 14 days 21)  Clarithromycin 500 Mg Tabs (Clarithromycin) .Marland Kitchen.. 1 tab by mouth daily  Other Orders: CBC-[facility] ([address])   Patient Instructions: 1)  Follow up with your regular physician in 7-10 days to make sure that you are improving. 2)  This could be related to ulcer disease, but we cannot know fo sure at this point. 3)  Start protonix 40 two times a day x 14 days. 4)  Amoxicillin 1 gram by mouth x 14 days  5)  Clarithromycin 500 mg by mouth x 14 days. 6)  This is to attempt to kill he h. pylori bacteria that could be related to ulcers. 7)  Return sooner or other concerns.     Prescriptions: CLARITHROMYCIN 500 MG  TABS (CLARITHROMYCIN) 1 tab by mouth daily  #14 x 0   Entered and Authorized by:   Glorianne Manchester MD   Signed by:   Glorianne Manchester MD on 08/14/2007   Method used:   Electronically sent to ...       [facility] [address]       [address]       [place], [place]  [address]       Ph: [phone]       Fax: [phone]   RxID:   [id number] AMOXICILLIN 500 MG  TABS (AMOXICILLIN) 2 tas by mouth daily x 14 days  #28 x 0   Entered and Authorized by:   Glorianne Manchester MD   Signed by:   Glorianne Manchester MD on 08/14/2007   Method used:   Electronically sent to ...       [facility] [address]       [address]       [place], [place]  [address]       Ph: [phone]       Fax: [phone]    RxID:   [id number] PROTONIX 40  MG TBEC (PANTOPRAZOLE SODIUM) Take 1 tablet by two times a day x 14 days  #28 x 0   Entered and Authorized by:   Glorianne Manchester MD   Signed by:   Glorianne Manchester MD on 08/14/2007   Method used:   Electronically sent to ...       [facility] [address]       [address]       [place], [place]  [address]       Ph: [phone]       Fax: [phone]   RxID:   [id number]  ]

## 2010-04-13 NOTE — Miscellaneous (Signed)
Summary: [place] approved  Clinical Lists Changes rec;d notice from [employer] that [place] has been approved. #[id number]...Marland KitchenMarland KitchenGolden Circle RN  May 14, 2008 1:55 PM

## 2010-04-13 NOTE — Progress Notes (Signed)
Summary: status of rx   Phone Note Call from Patient Call back at [phone]   Caller: Nakeya/daughter Reason for Call: Talk to Nurse Summary of Call: pts daughter is checking status of rx for cream for eczema, has already called pharmacy and pharmacy told her they are waiting on [place] to contact them, pt goes to [facility] Initial call taken by: Denny Peon LEVAN,  December 07, 2006 3:19 PM  Follow-up for Phone Call        advised that no message has been received from pharmacy but will send message to MD. Follow-up by: Theresia Lo RN,  December 07, 2006 4:32 PM  Additional Follow-up for Phone Call Additional follow up Details #1::        i believe I completed a fax on this yesterday.  it was sent.   Additional Follow-up by: Wilhemina Bonito  MD,  December 08, 2006 4:39 PM    Additional Follow-up for Phone Call Additional follow up Details #2::    Phone call to pt- no answer.  Follow-up by: Jacki Cones RN,  December 11, 2006 9:01 AM  Additional Follow-up for Phone Call Additional follow up Details #3:: Details for Additional Follow-up Action Taken: called pt. pt received meds Additional Follow-up by: Arlyss Repress CMA,,  December 11, 2006 11:41 AM

## 2010-04-13 NOTE — Assessment & Plan Note (Signed)
Summary: constipation & pressure on r side   Vital Signs:  Patient Profile:   45 Years Old Female Height:     62 inches Weight:      215.9 pounds Temp:     99 degrees F Pulse rate:   63 / minute BP sitting:   140 / 93  Pt. in pain?   yes    Location:   10  Vitals Entered By: Ledell Peoples, RN                  PCP:  Wilhemina Bonito  MD  Chief Complaint:  r sided pain.  History of Present Illness: HPI/ROS New Complaint: Nausea x 1 week with vomiting x 1 day.  Longstanding history of intermittent nausea with GERD dx.  Patient notes that nausea is similar to dyspeptic sx that she experienced before, but that she's not felt this way in a year.  The nausea was gradual in onset and accompanied by RUQ abdominal pain.  since yesterday, she reports vomiting with the nausea.  No fever.  Abdominal pain is 4/10, colicky.  She notes longstanding constipation, which is stable.  She has perhaps 2 bowel movements in a week and does not quantify her daily fiber intake.  Last night, after a hard stool, she notes light pink streaking on toilet paper, no blood on stool or in bowl.  Past history/Family History of this problem:  GERD, Chronic constipation, newly diagnosed diabetes 11/08  Tobacco Use:  See Risk Factors and CPOE.   Obesity:  BMI noted on VS and CPOE where applicable.        Risk Factors:  Tobacco use:  current    Cigarettes:  Yes -- 1/20 pack(s) per day    Counseled to quit/cut down tobacco use:  yes  PAP Smear History:    Date of Last PAP Smear:  10/13/2002    Physical Exam  Awake, alert, no distress.  Pt is responsive and intelligible.  No icterus, jaundice or JVD.  Heart sounds are regular and without murmur, thrill or PMI displacement.  Normal work of breathing with lungs clear to auscultation and percussion.  Abdomen is soft, tender in the epigastrium and nondistended.  Normal bowel sounds in all four quadrants.  Extremities are warm and well  perfused.     Impression & Recommendations:  Problem # 1:  NAUSEA AND VOMITING (ICD-787.01) Assessment: New GERD, constipation, Diabetes Mellitus and perhaps even choledochal disease could all be contributory.  TSH done 11/08 normal.  Will evaluate labs and follow up with primary MD. Of note, she was able to tolerate 12oz of water in the clinic.  Phenergan was given here and she tolerated the dose without emesis.  Orders: Comp Met-[facility] [id number] Urinalysis-[facility] (00000) Ultrasound (Ultrasound) Ac Hepatitis-[facility] ([address]914[address]) H pylori-[facility] ([address]) [facility]- Est  Level 4 ([address])   Problem # 2:  DIABETES-TYPE 2 (ICD-250.00) Assessment: Unchanged Patient has been tolerating her metformin without complaint, and while possible that this medication is contributing her symptoms, she denies recent titration of the medication.  She DNKA'd her last visit.  Will review CBG, UA and A1C as part of the nausea workup.  Her updated medication list for this problem includes:    Diovan Hct 160-12.5 Mg Tabs (Valsartan-hydrochlorothiazide) .Marland Kitchen... Take 1 tablet by mouth once a day    Metformin Hcl 500 Mg Tabs (Metformin hcl) .Marland Kitchen... Take 1 tablet in am for 1 week, if no gi problems, then increase to 1 tablet twice a day.  Orders: A1C-[facility] ([address])   Problem # 3:  CONSTIPATION, INTERMITTENT (ICD-564.00) Assessment: New Enemas from below, Miralax from above, and increase daily fiber to 19g.  Her updated medication list for this problem includes:    Fleet Mineral Oil Enem (Mineral oil) ..... Use per rectum for relief of constipation.  may repeat in 8 hours if bowel movement does not result.    Miralax Powd (Polyethylene glycol 3350) .Marland KitchenMarland KitchenMarland KitchenMarland Kitchen 17g dissolved in 8oz of water daily for relief of constipation.  disp q.s. 30 days    Benefiber Powd (Wheat dextrin) ..... Use as directed to increase daily fiber intake.  Orders: [facility]- Est  Level 4 ([address])   Complete Medication List: 1)  Advair Diskus 500-50 Mcg/dose  Misc (Fluticasone-salmeterol) .... Inhale 1 puff as directed twice a day 2)  Albuterol 90 Mcg/act Aers (Albuterol) .... Inhale 2 puff as directed every four hours 3)  Diovan Hct 160-12.5 Mg Tabs (Valsartan-hydrochlorothiazide) .... Take 1 tablet by mouth once a day 4)  Metoprolol Succinate 200 Mg Tb24 (Metoprolol succinate) .... Take 1 tablet by mouth once a day 5)  Protonix 40 Mg Tbec (Pantoprazole sodium) .... Take 1 tablet by mouth once a day 6)  Sertraline Hcl 100 Mg Tabs (Sertraline hcl) .... Take 1 1/2 tablet by mouth once a day 7)  Spiriva Handihaler 18 Mcg Caps (Tiotropium bromide monohydrate) .... Inhale 1 capsule as directed once a day 8)  Zyrtec Allergy 10 Mg Tabs (Cetirizine hcl) .... Take 1 tablet by mouth once a day 9)  Caduet 10-10 Mg Tabs (Amlodipine-atorvastatin) .Marland Kitchen.. 1 tablet a day for blood pressure and cholesterol 10)  Metrogel 1 % Gel (Metronidazole) .Marland Kitchen.. 1 applicator full into vagina x 5 days, use 1 time daily. 11)  Midrin 325-65-100 Mg Caps (Apap-isometheptene-dichloral) .... Take 2 tab at onset of headache, may repeat with 1 tablet every 1 up to 5 tablet in 12 hrs. 12)  Fluocinonide 0.05 % Crea (Fluocinonide) .... Appy to affected area twice a day. disp 60 gram tube 13)  Flexeril 10 Mg Tabs (Cyclobenzaprine hcl) .Marland Kitchen.. 1 tablet by mouth at bedtime 14)  Metformin Hcl 500 Mg Tabs (Metformin hcl) .... Take 1 tablet in am for 1 week, if no gi problems, then increase to 1 tablet twice a day. 15)  Medtronic Glucometer, Strips, and Lancets  .... Needing 1 glucometer, strips #60, lancets #60 disp qs 16)  Promethazine Hcl 25 Mg Tabs (Promethazine hcl) .... One tablet every 4 to 6 hours for nausea 17)  Fleet Mineral Oil Enem (Mineral oil) .... Use per rectum for relief of constipation.  may repeat in 8 hours if bowel movement does not result. 18)  Miralax Powd (Polyethylene glycol 3350) .Marland Kitchen.. 17g dissolved in 8oz of water daily for relief of constipation.  disp q.s. 30 days 19)   Benefiber Powd (Wheat dextrin) .... Use as directed to increase daily fiber intake.  Other Orders: EMR Misc Charge Code [facility]  Diabetes Management Assessment/Plan:      The following lipid goals have been established for the patient: Total cholesterol goal of 200; LDL cholesterol goal of 100; HDL cholesterol goal of 40; Triglyceride goal of 200.     Patient Instructions: 1)  Please schedule an appointment with your primary doctor at her next available appointment.  This should be in the next week or so. 2)  Increase your fiber to 19 grams a day.    Prescriptions: BENEFIBER   POWD (WHEAT DEXTRIN) Use as directed to increase daily fiber intake.  #  1 x 6   Entered by:   Towana Badger MD   Authorized by:   Rolm Gala MD   Signed by:   Towana Badger MD on 05/22/2007   Method used:   Electronically sent to ...       [facility] [address]       [address]       [place], [place]  [address]       Ph: [phone]       Fax: [phone]   RxID:   [id number] MIRALAX   POWD (POLYETHYLENE GLYCOL 3350) 17g dissolved in 8oz of water daily for relief of constipation.  Disp q.s. 30 days  #1 x 1   Entered by:   Towana Badger MD   Authorized by:   Rolm Gala MD   Signed by:   Towana Badger MD on 05/22/2007   Method used:   Electronically sent to ...       [facility] [address]       [address]       [place], [place]  [address]       Ph: [phone]       Fax: [phone]   RxID:   [id number] FLEET MINERAL OIL   ENEM (MINERAL OIL) Use per rectum for relief of constipation.  May repeat in 8 hours if bowel movement does not result.  #2 x 1   Entered by:   Towana Badger MD   Authorized by:   Rolm Gala MD   Signed by:   Towana Badger MD on 05/22/2007   Method used:   Electronically sent to ...       [facility] [address]       [address]       [place], [place]  [address]       Ph: [phone]        Fax: [phone]   RxID:   [id number] PROMETHAZINE HCL 25 MG  TABS (PROMETHAZINE HCL) One tablet every 4 to 6 hours for nausea  #30 x 0   Entered by:   Towana Badger MD   Authorized by:   Rolm Gala MD   Signed by:   Towana Badger MD on 05/22/2007   Method used:   Electronically sent to ...       [facility] [address]       [address]       [place], [place]  [address]       Ph: [phone]       Fax: [phone]   RxID:   [id number]  ]  Medication Administration  Injection # 1:    Medication: Promethazine up to 50mg     Diagnosis: NAUSEA AND VOMITING (ICD-787.01)    Route: oral    Exp Date: 08/12/2008    Lot #: [id number]    Mfr: [employer]    Comments: PT RECEIVED PROMETHAZINE 25 MG ONE BY MOUTH    Patient tolerated injection without complications    Given by: Dedra Skeens CMA, (May 22, 2007 10:27 AM)  Orders Added: 1)  Comp Met-[facility] [10175-10258] 2)  Urinalysis-[facility] [00000] 3)  Ultrasound [Ultrasound] 4)  Ac Hepatitis-[facility] [80074-22940] 5)  H pylori-[facility] [87339] 6)  A1C-[facility] [83036] 7)  [facility]- Est  Level 4 [[address]] 8)  EMR Misc Charge Code [EMRMisc]  Laboratory Results   Urine Tests  Date/Time Received: May 22, 2007 10:00 AM  Date/Time Reported: May 22, 2007 12:40 PM   Routine Urinalysis   Color: yellow Appearance: Clear Glucose: negative   (Normal Range: Negative) Bilirubin: small;   ictotest = neg   (Normal Range: Negative) Ketone: negative   (Normal Range: Negative) Spec. Gravity: >=1.030   (Normal Range: 1.003-1.035) Blood: negative   (Normal Range: Negative) pH: 6.0   (Normal Range: 5.0-8.0) Protein: 100   (Normal Range: Negative) Urobilinogen: 1.0   (Normal Range: 0-1) Nitrite: negative   (Normal Range: Negative) Leukocyte Esterace: negative   (Normal Range: Negative)  Urine Microscopic WBC/hpf: rare RBC/hpf: rare Bacteria: 3+ cocci Epithelial: 10 - 20 Casts/LPF: 1-5 hyaline cast Other: several clue  cells    Comments: ...............test performed by......Marland KitchenBonnie A. [place], [place] (ASCP)   Blood Tests   Date/Time Received: May 22, 2007 10:00 AM  Date/Time Reported: May 22, 2007 12:41 PM   HGBA1C: 6.1%   (Normal Range: Non-Diabetic - 3-6%   Control Diabetic - 6-8%)  H. pylori: positive Comments: H. pylori was a weak positive ...............test performed by......Marland KitchenBonnie A. [place], [place] (ASCP)

## 2010-04-13 NOTE — Miscellaneous (Signed)
Summary: Pulmonology Referral  White Team:  Please schedule pulmonology appointment for patient ASAP--this week if possible.  Please FAX the following to pulmonology: Referral letter Discharge Summary from E-chart (02/08/2008) CT Angiogram from E-chart (02/07/2008) PET Scan from E-chart (02/12/2008)  Appended Document: Pulmonology Referral appointment scheduled 02/14/08 at 10:00 AM  with Dr. Shelle Iron of [facility]. patient aware. records faxed.

## 2010-04-13 NOTE — Assessment & Plan Note (Signed)
Summary: Hospital Admit H and P   Vital Signs:  Patient Profile:   45 Years Old Female Height:     62 inches O2 Sat:      99 % O2 treatment:    Room Air Temp:     98.1 degrees F Pulse rate:   48 / minute Resp:     16 per minute BP supine:   143 / 72                 PCP:  Romero Belling MD  Chief Complaint:  Chest Pain.  History of Present Illness: 45 yo with multiple medical problems, including DM, HTN, PUD, asthma, and HLD who presents to ED with complaint of multiple episodes of CP and SOB.  She has had at least five episodes per day for the past week.  First occurred last Tuesday and have each been similar in nature until today.  Prior episodes are describted as CP with SOB which occured with exertion and relieved by rest.  CP is substernal, radiates left shouder and is not associated with nausea, vomitting, or diaphoresis.  SOB occurs acutely with chest pain and is not accompanied by wheezing or relieved albuterol.  Denies any cough, sputum productive, fevers, or LE edema.  She has had 3 pillow orthopnea for last several months.  Today, CP and SOB started around 2 pm while seated at her desk when a fire drill started.  Was worsened with exertion.  SOB worsened, CP progressed to squeezing substernal pressure 8/10 in intensity and unsure if was relieved by rest because she does not recall events beyond this point and being in being in ambulance.  CP was relieved by Nitroglycerin in the ambulance.  Currently in ED, she is having CP again which is relieved by NTG and rest.  No known h/o CAD but mulitple risk factors, including HTN, HLD, smoking history, and strong family history- mom had 2 MIs in her 4s.  She states that she had a normal exercise stress test two years ago with our practice.  In ED, pain has resolved with NTG and ASA.  She contiues to have SOB.  EKG shows no acute changes, POC enzymes negative.  CXR showed poor effort with questionable mass in Left upper lobe. Ddimer  0.29 WBC 9.1, Hbg 14.8, hct 44.6 platelets 235 Hgaqc in 3/09 was 6.1    Updated Prior Medication List: ADVAIR DISKUS 500-50 MCG/DOSE MISC (FLUTICASONE-SALMETEROL) Inhale 1 puff as directed twice a day VENTOLIN HFA 108 (90 BASE) MCG/ACT AERS (ALBUTEROL SULFATE) Inhale 2 puffs q4h as directed DIOVAN HCT 160-12.5 MG TABS (VALSARTAN-HYDROCHLOROTHIAZIDE) Take 1 tablet by mouth once a day METOPROLOL TARTRATE 100 MG  TABS (METOPROLOL TARTRATE) 2 tabs by mouth bid ZYRTEC ALLERGY 10 MG TABS (CETIRIZINE HCL) Take 1 tablet by mouth once a day CADUET 10-10 MG TABS (AMLODIPINE-ATORVASTATIN) 1 tablet a day for blood pressure and cholesterol MIDRIN 325-65-100 MG CAPS (APAP-ISOMETHEPTENE-DICHLORAL) take 2 tab at onset of headache, may repeat with 1 tablet every 1 up to 5 tablet in 12 hrs. FLUOCINONIDE 0.05 %  CREA (FLUOCINONIDE) appy to affected area twice a day. disp 60 gram tube METFORMIN HCL 500 MG TABS (METFORMIN HCL) take 1 tablet in AM for 1 week, if no GI problems, then increase to 1 tablet twice a day. * MEDTRONIC GLUCOMETER, STRIPS, AND LANCETS needing 1 glucometer, strips #60, lancets #60 disp QS PANTOPRAZOLE SODIUM 40 MG TBEC (PANTOPRAZOLE SODIUM) 1 tab by mouth daily  Current Allergies: No known  allergies   Past Medical History:    Reviewed history from 07/17/2007 and no changes required:       [id number],        retinitis pigmetosa,        Sinusitis       depression       obesity       low back pain       disk herniation       migraines       DM TYPE II- 11/08- new dx      Past Surgical History:    Reviewed history from 07/17/2007 and no changes required:       BTL - 09/06/2000, c-s x3 - 08/25/2000, eye surgeries - 08/25/2000, Hysterectomy - Partial - 09/11/2001, Spirometry - mild - mod obs dz - 10/12/2004       knee- arthroscopy on R 2009, and repair of torn ligaments on L 2008- Dr. Christian Mate, Dr. Lajoyce Corners did L.      Family History:    Reviewed history from 07/17/2007 and no changes required:        F- died unknown causes, GF-CAD, GM-DM, htn, M-htn, DM, mom with early menopause, no h/o breat/colon ca, u x2-CAD h/o of 2 MIs.       daughter , sister, and cousins with migraines.  mother sometimes has a migraine       Moterh with MIs x 2       CVAs in maternal aunt.  .      Social History:    Reviewed history from 07/17/2007 and no changes required:       quit smoking in Jluy 2009, former 1 1/2 ppd smoker for 30 years; occas EtOH; married with 4 kids      Review of Systems      See HPI   Physical Exam  General:     Overweight-appearing, no acute distress.  Alert and oriented x 3.  Neck:     No deformities, masses, or tenderness noted. no carotid bruits.  Lungs:     Normal respiratory effort, chest expands symmetrically. Lungs are clear to auscultation, no crackles or wheezes. Heart:     Normal rate and regular rhythm. S1 and S2 normal without gallop, murmur, click, rub or other extra sounds. Abdomen:     Normoactive bowel sounds.  Soft, mild tenderness of LLQ, but otherwise non-tender, non-distended.  No rebound/guarding. Extremities:     no noted edema, + 2 DP bilaterall    Impression & Recommendations:  Problem # 1:  CHEST PAIN (ICD-786.50) Assessment: New Typical in nature with very strong family history and risk factors.  Although EKG is unchanged and POC enzymes negative, will treat as if she is having ACS and rule of for MI.  Cycle cardiac ezymes x 3 q 8 hours apart.  Start ASA, Plavix, Heparin, continue her beta blocker at half dose as she bradycardic and consult cardiology.  Will place on telemetry, order a 2 d echo and further risk stratify with FLP, TSH, and Hga1c.   Problem # 2:  DIABETES-TYPE 2 (ICD-250.00) Assessment: Unchanged Has been well controlled, with last Hga1c in March of 6.1.  Will place on SSI and hold Metformin. Her updated medication list for this problem includes:    Diovan Hct 160-12.5 Mg Tabs (Valsartan-hydrochlorothiazide) .Marland Kitchen... Take 1  tablet by mouth once a day    Metformin Hcl 500 Mg Tabs (Metformin hcl) .Marland Kitchen... Take 1 tablet in am for 1 week, if no  gi problems, then increase to 1 tablet twice a day.   Problem # 3:  HYPERTENSION, BENIGN SYSTEMIC (ICD-401.1) Assessment: Deteriorated Elevated and would like to control BP for cardiac protection however is bradycardic so will half her dose of Metoprolol. continue other meds as written.    Diovan Hct 160-12.5 Mg Tabs (Valsartan-hydrochlorothiazide) .Marland Kitchen... Take 1 tablet by mouth once a day    Metoprolol Tartrate 100 Mg Tabs (Metoprolol tartrate) .Marland Kitchen... 2 tabs by mouth bid    Caduet 10-10 Mg Tabs (Amlodipine-atorvastatin) .Marland Kitchen... 1 tablet a day for blood pressure and cholesterol   Problem # 4:  HYPERCHOLESTEROLEMIA (ICD-272.0) Will check FLP in am.  Was slightly above goal last year with LDL of 107 and HDL of 36. Her updated medication list for this problem includes:    Caduet 10-10 Mg Tabs (Amlodipine-atorvastatin) .Marland Kitchen... 1 tablet a day for blood pressure and cholesterol   Problem # 5:  FEN/GI Npo except meds.  NS at 125 ml/hr.  Problem # 6:  prophylaxis On heparin for ACS and will hold h2 blocker as starting Plavix.  Complete Medication List: 1)  Advair Diskus 500-50 Mcg/dose Misc (Fluticasone-salmeterol) .... Inhale 1 puff as directed twice a day 2)  Ventolin Hfa 108 (90 Base) Mcg/act Aers (Albuterol sulfate) .... Inhale 2 puffs q4h as directed 3)  Diovan Hct 160-12.5 Mg Tabs (Valsartan-hydrochlorothiazide) .... Take 1 tablet by mouth once a day 4)  Metoprolol Tartrate 100 Mg Tabs (Metoprolol tartrate) .... 2 tabs by mouth bid 5)  Zyrtec Allergy 10 Mg Tabs (Cetirizine hcl) .... Take 1 tablet by mouth once a day 6)  Caduet 10-10 Mg Tabs (Amlodipine-atorvastatin) .Marland Kitchen.. 1 tablet a day for blood pressure and cholesterol 7)  Midrin 325-65-100 Mg Caps (Apap-isometheptene-dichloral) .... Take 2 tab at onset of headache, may repeat with 1 tablet every 1 up to 5 tablet in 12 hrs. 8)   Fluocinonide 0.05 % Crea (Fluocinonide) .... Appy to affected area twice a day. disp 60 gram tube 9)  Metformin Hcl 500 Mg Tabs (Metformin hcl) .... Take 1 tablet in am for 1 week, if no gi problems, then increase to 1 tablet twice a day. 10)  Medtronic Glucometer, Strips, and Lancets  .... Needing 1 glucometer, strips #60, lancets #60 disp qs 11)  Pantoprazole Sodium 40 Mg Tbec (Pantoprazole sodium) .Marland Kitchen.. 1 tab by mouth daily    ]  Appended Document: Hospital Admit H and P Spoke with [facility] cardiology.  They would like for me to hold plavix for now and only start ASA and Lovenox (not heparin).  He will see her in the morning.

## 2010-04-13 NOTE — Miscellaneous (Signed)
  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

## 2010-04-13 NOTE — Letter (Signed)
Summary: Generic Letter  [facility]  [address]   [place], [place] [address]   Phone: [phone]  Fax: [phone]    10/21/2008  KOBI ALLER [address]  [address] [place], [place]  [address]  To whom it may conern, Please allow [address] Filter to return to work 10/23/08.  Thank you.           Sincerely,   Antoine Primas DO

## 2010-04-13 NOTE — Miscellaneous (Signed)
Summary: doppler studies/ts  Clinical Lists Changes appt 07-25-07 at [facility] at 9:45am. pt informed. order faxed.Marland KitchenTHEKLA [place] CMA,  Jul 17, 2007 10:48 AM

## 2010-04-13 NOTE — Progress Notes (Signed)
Summary: Triage  Phone Note Call from Patient Call back at [phone]   Caller: Daughter Summary of Call: c/o chest pain and white patches on scalp, wants to discuss with rn. Initial call taken by: Haydee Salter,  February 04, 2008 2:19 PM  Follow-up for Phone Call        daughter calls for patient. patient is at work. daughter states patient has been having chest pain for couple of days. she is unsure where her pain is, she thinks it may be near right shoulder area but she is not sure. patient just said  she was having chest pain. advised daughter if patient is having chest pain she needs to go to ER now for evaluation. she will notifiy  patient . Follow-up by: Theresia Lo RN,  February 04, 2008 2:29 PM

## 2010-04-13 NOTE — Assessment & Plan Note (Signed)
Summary: stomach prob,df   Vital Signs:  Patient profile:   45 year old female Height:      63 inches Weight:      225 pounds BMI:     40.00 BSA:     2.03 Temp:     98.8 degrees F Pulse rate:   77 / minute BP sitting:   150 / 88  Vitals Entered By: Jone Baseman CMA (April 08, 2009 10:58 AM) CC: stomach pain x 2 weeks Is Patient Diabetic? No Pain Assessment Patient in pain? yes     Location: stomach Intensity: 9 Type: burning   Primary Care Provider:  Romero Belling MD  CC:  stomach pain x 2 weeks.  History of Present Illness: 45 yo female with abdominal pain for the past 2 weeks.  Is nauseated, vomited once last week.  No hematochezia but has had intermittent melena, last time was last night.  The pain is epigastric and worse postprandial.  She takes Omeprazole two times a day, and has a history of peptic ulcer disease.  Patient is s/p hysterectomy.  ROS negative for heartburn, dysuria, constipation.  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 2 Tabs By Mouth Two Times A Day 2)  Caduet 10-10 Mg Tabs (Amlodipine-Atorvastatin) .Marland Kitchen.. 1 Tablet A Day For Blood Pressure and Cholesterol 3)  Fluocinonide 0.05 %  Crea (Fluocinonide) .... Appy To Affected Area Twice A Day. Disp 60 Gram Tube. 4)  Sertraline Hcl 100 Mg Tabs (Sertraline Hcl) .Marland Kitchen.. 1 Tab By Mouth Daily 5)  Fluticasone Propionate 50 Mcg/act Susp (Fluticasone Propionate) .Marland Kitchen.. 1 Spray Each Nare Once Daily.  Disp Qs X1 Month. 6)  Cetirizine Hcl 10 Mg Tabs (Cetirizine Hcl) .Marland Kitchen.. 1 Tab By Mouth Daily For Allergies 7)  Diovan Hct 160-12.5 Mg Tabs (Valsartan-Hydrochlorothiazide) .... Take 1 Pill By Mouth Once Daily 8)  Premarin 0.3 Mg Tabs (Estrogens Conjugated) .... Take 1 Pill By Mouth Once Daily 9)  Omeprazole 40 Mg Cpdr (Omeprazole) .Marland Kitchen.. 1 Tab By Mouth Two Times A Day  Allergies (verified): 1)  ! Codeine 2)  ! Penicillin  Past History:  Past Medical History: Last updated: 02/14/2008 [id number],  retinitis pigmetosa,  Sinusitis depression obesity low back pain disk herniation migraines DM TYPE II- 11/08- new dx     Current Problems:  PULMONARY NODULE (ICD-518.89) CHEST PAIN (ICD-786.50) URINARY FREQUENCY, CHRONIC (ICD-788.41) CONTACT OR EXPOSURE TO OTHER VIRAL DISEASES (ICD-V01.79) ALLERGIC RHINITIS (ICD-477.9) LEG PAIN,  BILATERAL (ICD-729.5) CONSTIPATION, INTERMITTENT (ICD-564.00) ABDOMINAL PAIN, RIGHT UPPER QUADRANT (ICD-789.01) NAUSEA AND VOMITING (ICD-787.01) WEIGHT GAIN (ICD-783.1) DIABETES-TYPE 2 (ICD-250.00) AODM (ICD-250.00) POLYURIA (ICD-788.42) DYSURIA (ICD-788.1) SYMPTOM, DISTURBANCE OF SKIN SENSATION (ICD-782.0) HEADACHE, REBOUND (ICD-784.0) MIGRAINE, COMMON W/O INTRACTABLE MIGRAINE (ICD-346.10) BACK PAIN, CHRONIC (ICD-724.5) TOBACCO DEPENDENCE (ICD-305.1) PEPTIC ULCER  DIS., UNSPEC. W/O OBSTRUCTION (ICD-533.90) OSTEOARTHRITIS, LOWER LEG (ICD-715.96) ONYCHOMYCOSIS (ICD-110.1) MENSTRUATION, PAINFUL (ICD-625.3) IRRITABLE BOWEL SYNDROME (ICD-564.1) HYPERTENSION, BENIGN SYSTEMIC (ICD-401.1) HYPERCHOLESTEROLEMIA (ICD-272.0) GLAUCOMA (ICD-365.9) ECZEMA, ATOPIC DERMATITIS (ICD-691.8) DEPRESSION, MAJOR, RECURRENT (ICD-296.30) COPD (ICD-496)    Past Surgical History: Last updated: 07/17/2007 BTL - 09/06/2000, c-s x3 - 08/25/2000, eye surgeries - 08/25/2000, Hysterectomy - Partial - 09/11/2001, Spirometry - mild - mod obs dz - 10/12/2004 knee- arthroscopy on R 2009, and repair of torn ligaments on L 2008- Dr. Christian Mate, Dr. Lajoyce Corners did L.     Family History: Last updated: 15-Feb-2008 F- died unknown causes, GF-CAD, GM-DM, htn, M-htn, DM, mom with early menopause, no h/o breat/colon ca, u x2-CAD h/o of 2 MIs. daughter , sister, and cousins with migraines.  mother sometimes has a migraine Mot[place] with MIs x 2 CVAs in maternal aunt.  .       heart disease: mother, maternal grandfather, maternal uncles cancer: maternal aunts (brain, stomach, lung)   Social History: Last updated: 02/15/2008 quit smoking in Sept 2009, former 1 to 1 1/2 ppd smoker since age 10 occas EtOH; pt is single. pt works for [employer].   Risk Factors: Smoking Status: never (04/08/2009) Packs/Day: 2 (10/09/2007)  Social History: Smoking Status:  never  Physical Exam  General:  Alert and oriented x3, obese, no acute distress Lungs:  Clear to auscultation bilateral with normal work of breathing. Heart:  Regular rate and rhythm without murmurs, rubs, or gallops. Abdomen:  Soft, normoactive bowel sounds with no masses or hepatosplenomegaly.  Mild epigastric pain, no guarding/rebound.   Impression & Recommendations:  Problem # 1:  EPIGASTRIC PAIN (ICD-789.06) Assessment Deteriorated With melena and h/o PUD.  PPI two times a day already.  Will refer to  gastroenterology.  Orders: [facility]- Est Level  3 ([address])  Complete Medication List: 1)  Metoprolol Tartrate 100 Mg Tabs (Metoprolol tartrate) .... 2 tabs by mouth two times a day 2)  Caduet 10-10 Mg Tabs (Amlodipine-atorvastatin) .Marland Kitchen.. 1 tablet a day for blood pressure and cholesterol 3)  Fluocinonide 0.05 % Crea (Fluocinonide) .... Appy to affected area twice a day. disp 60 gram tube. 4)  Sertraline Hcl 100 Mg Tabs (Sertraline hcl) .Marland Kitchen.. 1 tab by mouth daily 5)  Fluticasone Propionate 50 Mcg/act Susp (Fluticasone propionate) .Marland Kitchen.. 1 spray each nare once daily.  disp qs x1 month. 6)  Cetirizine Hcl 10 Mg Tabs (Cetirizine hcl) .Marland Kitchen.. 1 tab by mouth daily for allergies 7)  Diovan Hct 160-12.5 Mg Tabs (Valsartan-hydrochlorothiazide) .... Take 1 pill by mouth once daily 8)  Premarin 0.3 Mg Tabs (Estrogens conjugated) .... Take 1 pill by mouth once daily 9)  Omeprazole 40 Mg Cpdr (Omeprazole) .Marland Kitchen.. 1 tab by mouth two times a day  Patient Instructions: 1)  We are arranging a visit with a gastroenterologist.  Will call with appointment.  Appended Document: stomach prob,df    Clinical Lists Changes  Orders: Added new Referral order of Gastroenterology Referral (GI) - Signed

## 2010-04-13 NOTE — Progress Notes (Signed)
Summary: [employer]  Phone Note Other Incoming Call back at [employer] [phone]   Summary of Call: checking status of fax from [employer]. Initial call taken by: Haydee Salter,  February 26, 2008 10:29 AM  Follow-up for Phone Call        form faxed  as requested. patient notified. Follow-up by: Theresia Lo RN,  February 26, 2008 10:52 AM

## 2010-04-13 NOTE — Letter (Signed)
Summary: Dermatology Referral  [facility]  [address]   [place], [place] [address]   Phone: [phone]  Fax: [phone]    01/13/2009  Thank you in advance for agreeing to see my patient:  Theresa [name] [address] [place], [place]  [address] Phone: [phone]  Reason for Referral: Psoriasis of the hands, bilateral. Procedures Requested: Please evaluate and treat.  She is currently using FLUOCINONIDE.  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) SINUSITIS, CHRONIC (ICD-473.9) URINARY FREQUENCY, CHRONIC (ICD-788.41) DIABETES-TYPE 2 (ICD-250.00) BACK PAIN, CHRONIC (ICD-724.5) OSTEOARTHRITIS, LOWER LEG (ICD-715.96) IRRITABLE BOWEL SYNDROME (ICD-564.1) HYPERTENSION, BENIGN SYSTEMIC (ICD-401.1) HYPERCHOLESTEROLEMIA (ICD-272.0) GLAUCOMA (ICD-365.9) DEPRESSION, MAJOR, RECURRENT (ICD-296.30)  Current Medications: 1)  METOPROLOL TARTRATE 100 MG  TABS (METOPROLOL TARTRATE) 2 tabs by mouth two times a day 2)  CADUET 10-10 MG TABS (AMLODIPINE-ATORVASTATIN) 1 tablet a day for blood pressure and cholesterol 3)  FLUOCINONIDE 0.05 %  CREA (FLUOCINONIDE) appy to affected area twice a day. disp 60 gram tube. 4)  PANTOPRAZOLE SODIUM 40 MG TBEC (PANTOPRAZOLE SODIUM) 1 tab by mouth two times a day 5)  SERTRALINE HCL 100 MG TABS (SERTRALINE HCL) 1 tab by mouth daily 6)  FLUTICASONE PROPIONATE 50 MCG/ACT SUSP (FLUTICASONE PROPIONATE) 1 spray each nare once daily.  Disp QS x1 month. 7)  CETIRIZINE HCL 10 MG TABS (CETIRIZINE HCL) 1 tab by mouth daily for allergies 8)  DIOVAN HCT 160-12.5 MG TABS (VALSARTAN-HYDROCHLOROTHIAZIDE) take 1 pill by mouth once daily 9)  PREMARIN 0.3 MG TABS (ESTROGENS CONJUGATED) take 1 pill by mouth once daily  Thank you again for agreeing to see our patient; please contact us if you have any further questions or need additional information.   Sincerely,   Romero Belling MD

## 2010-04-13 NOTE — Assessment & Plan Note (Signed)
Summary: breathing "like  hiccups" flying tomorrow/[place]   Vital Signs:  Patient profile:   45 year old female Height:      63 inches Weight:      204.8 pounds BMI:     36.41 Temp:     98.5 degrees F Pulse rate:   83 / minute BP sitting:   149 / 95  (left arm)  Vitals Entered By: Theresia Lo RN (October 02, 2008 3:41 PM) CC: sensation of breath catching like when she hiccups. Is Patient Diabetic? No Pain Assessment Patient in pain? yes     Location: chest heavy Intensity: 7 Type: heavy   Primary Care Provider:  Romero Belling MD  CC:  sensation of breath catching like when she hiccups..  History of Present Illness: Theresa [name] is a 45 year old female with a PMHx of LUL adenocarcinoma, s/p LUL resection by Dr. Edwyna Shell presenting for:  1. "Catching breath in throat:" since operation, with dry cough x several months with no change, she states that there has been no change in her condition x several months (and that she has seen her pulmonologist about this already), but she is now concerned because she will be traveling by plane to [facility] tomorrow to see her mother (in the hospital). she denies CP, HA, dizziness, vision changes, productive cough, fever/chills, trouble swallowing, wheezing, or edema. she endorses some dyspnea, reflux, anxiety, and allergies. She is on no inhalers. She has an appointment with Dr Shirline Frees on Aug. 11 for a f/u CT scan and with Dr. Edwyna Shell on Aug. 23.  Habits & Providers  Alcohol-Tobacco-Diet     Tobacco Status: quit  Current Medications (verified): 1)  Metoprolol Tartrate 100 Mg  Tabs (Metoprolol Tartrate) .... 2 Tabs By Mouth Two Times A Day 2)  Caduet 10-10 Mg Tabs (Amlodipine-Atorvastatin) .Marland Kitchen.. 1 Tablet A Day For Blood Pressure and Cholesterol 3)  Fluocinonide 0.05 %  Crea (Fluocinonide) .... Appy To Affected Area Twice A Day. Disp 60 Gram Tube. 4)  Pantoprazole Sodium 40 Mg Tbec (Pantoprazole Sodium) .Marland Kitchen.. 1 Tab By Mouth Two Times A Day 5)   Miralax  Powd (Polyethylene Glycol 3350) .Marland KitchenMarland KitchenMarland Kitchen 17 Gm By Mouth Daily. Disp 1 Month Supply. 6)  Sertraline Hcl 100 Mg Tabs (Sertraline Hcl) .... 1/2 Tab By Mouth Daily X2 Weeks, Then 1 Tab By Mouth Daily 7)  Fluticasone Propionate 50 Mcg/act Susp (Fluticasone Propionate) .Marland Kitchen.. 1 Spray Each Nare Once Daily.  Disp Qs X1 Month. 8)  Cetirizine Hcl 10 Mg Tabs (Cetirizine Hcl) .Marland Kitchen.. 1 Tab By Mouth Daily For Allergies 9)  Alprazolam 0.5 Mg Tabs (Alprazolam) .... Take 1/2 To 1 Tab By Mouth Before Flight  Allergies (verified): 1)  ! Codeine 2)  ! Penicillin  Social History: Smoking Status:  quit  Review of Systems       per HPI, otherwise negative  Physical Exam  General:  alert, well-developed, well-nourished, and well-hydrated.  vital signs reviewed. Mouth:  pharynx pink and moist, no erythema, and no exudates.   Neck:  No deformities, masses, or tenderness noted. Lungs:  Clear to auscultation bilateral with normal work of breathing. No wheezing or stridor. Voice sounds breathy. Heart:  Regular rate and rhythm without murmurs, rubs, or gallops. Psych:  Oriented X3, normally interactive, good eye contact, and moderately anxious.     Impression & Recommendations:  Problem # 1:  OTHER DYSPNEA AND RESPIRATORY ABNORMALITIES (ICD-786.09) Assessment Unchanged  S/S suspicious for pseudoasthma but will defer that diagnosis to the patient's pulmonologist.  Likely contributors are reflux, anxiety, and sinus allergies, possibly contibuted to by ARB. The patient has an oxygen saturation of 98% on room air and her pulmonologist has not felt that she needs a rescue inhaler. I did not hear any stridor or wheezing on exam. I did not feel any masses on neck exam, but will check thyroid function. Will Rx benzo for plane trip to control anxiety. Advised patient to take PPI on an empty stomach to increase absorption. Follow up with PCP and pulmonologist for further evaluation when back from trip.  Orders: [facility]- Est  Level  3 ([id number])  Complete Medication List: 1)  Metoprolol Tartrate 100 Mg Tabs (Metoprolol tartrate) .... 2 tabs by mouth two times a day 2)  Caduet 10-10 Mg Tabs (Amlodipine-atorvastatin) .Marland Kitchen.. 1 tablet a day for blood pressure and cholesterol 3)  Fluocinonide 0.05 % Crea (Fluocinonide) .... Appy to affected area twice a day. disp 60 gram tube. 4)  Pantoprazole Sodium 40 Mg Tbec (Pantoprazole sodium) .Marland Kitchen.. 1 tab by mouth two times a day 5)  Miralax Powd (Polyethylene glycol 3350) .Marland KitchenMarland Kitchen. 17 gm by mouth daily. disp 1 month supply. 6)  Sertraline Hcl 100 Mg Tabs (Sertraline hcl) .... 1/2 tab by mouth daily x2 weeks, then 1 tab by mouth daily 7)  Fluticasone Propionate 50 Mcg/act Susp (Fluticasone propionate) .Marland Kitchen.. 1 spray each nare once daily.  disp qs x1 month. 8)  Cetirizine Hcl 10 Mg Tabs (Cetirizine hcl) .Marland Kitchen.. 1 tab by mouth daily for allergies 9)  Alprazolam 0.5 Mg Tabs (Alprazolam) .... Take 1/2 to 1 tab by mouth before flight  Other Orders: [facility] ([id number])  Patient Instructions: 1)  It was nice to meet you today! 2)  I did not find anything that would keep you from going on your trip. 3)  Please follow up with your PCP when you get back. 4)  We will check your thryoid today and call you if the results are abnormal. Prescriptions: ALPRAZOLAM 0.5 MG TABS (ALPRAZOLAM) take 1/2 to 1 tab by mouth before flight  #10 x 0   Entered and Authorized by:   Helane Rima MD   Signed by:   Helane Rima MD on 10/02/2008   Method used:   Print then Give to Patient   RxID:   [id number]

## 2010-04-13 NOTE — Miscellaneous (Signed)
Summary: FLP from [facility] Admission  Clinical Lists Changes  Observations: Added new observation of TDBOOSTDUE: 04/14/2012 (04/18/2008 8:58) Added new observation of HDLNXTDUE: 02/01/2012 (04/18/2008 8:58) Added new observation of LDLNXTDUE: 02/01/2012 (04/18/2008 8:58) Added new observation of PAP DUE: 10/13/2003 (04/18/2008 8:58) Added new observation of HGBA1CNXTDUE: 07/15/2008 (04/18/2008 8:58) Added new observation of MICRALB DUE: 02/01/2008 (04/18/2008 8:58) Added new observation of CREATNXTDUE: 04/17/2009 (04/18/2008 8:58) Added new observation of POTASSIUMDUE: 04/17/2009 (04/18/2008 8:58) Added new observation of LAST HEMOA1C: 04/17/2008 (04/17/2008 9:00) Added new observation of LAST CREAT: 04/17/2008 (04/17/2008 9:00) Added new observation of LDL: 113 mg/dL (47/82/9562 1:30) Added new observation of HDL: 31 mg/dL (86/57/8469 6:29) Added new observation of TRIGLYC TOT: 71 mg/dL (52/84/1324 4:01) Added new observation of CHOLESTEROL: 158 mg/dL (02/72/5366 4:40)

## 2010-04-13 NOTE — Assessment & Plan Note (Signed)
Summary: Smoking Cessation - Rx clinic   Vital Signs:  Patient Profile:   45 Years Old Female Height:     62 inches Weight:      283 pounds BMI:     51.95 BP sitting:   133 / 87  (left arm)                  PCP:  Romero Belling MD   History of Present Illness: states she feels horrible.  She is short of breath when walking up stairs.  She feels like she cannot keep up with her daughter. She does not want to be on inhalers.  She does not want to be a smoker like her mother who still smokes after a cabg procedure.     Lipid Management History:      Positive NCEP/ATP III risk factors include diabetes, HDL cholesterol less than 40, current tobacco user, and hypertension.  Negative NCEP/ATP III risk factors include female age less than 50 years old.         Risk Factors:  Tobacco use:  current    Year started:  1980    Counseled to quit/cut down tobacco use:  yes      Impression & Recommendations:  Problem # 1:  TOBACCO DEPENDENCE (ICD-305.1)  Moderate / Severe Nicotine Abuse of: 30  years duration in a patient who is: good candidate for success b/c of: previous success with quitting and number of attempts.  She has a very high level of motivation at this time. Initiated varenicline. Counseled on purpose, proper use, and potential adverse effects, including nausea.  Cautioned regarding CNS effects.  Advised to call office if any mood changes or suicidal ideation.  Written information provided: 2 brochures and chantix info shet.  F/U Rx Clinic Visit: 2 weeks.  Total time with patient in face-to-face counseling: 45  minutes.   New Rx called to [facility].     [phone]  Her updated medication list for this problem includes:    Chantix Starting Month Pak 0.5 Mg X 11 & 1 Mg X 42 Misc (Varenicline tartrate) .Marland Kitchen... As directed   Complete Medication List: 1)  Advair Diskus 500-50 Mcg/dose Misc (Fluticasone-salmeterol)  .... Inhale 1 puff as directed twice a day 2)  Albuterol 90 Mcg/act Aers (Albuterol) .... Inhale 2 puff as directed every four hours 3)  Diovan Hct 160-12.5 Mg Tabs (Valsartan-hydrochlorothiazide) .... Take 1 tablet by mouth once a day 4)  Metoprolol Succinate 200 Mg Tb24 (Metoprolol succinate) .... Take 1 tablet by mouth once a day 5)  Protonix 40 Mg Tbec (Pantoprazole sodium) .... Take 1 tablet by two times a day x 14 days 6)  Sertraline Hcl 100 Mg Tabs (Sertraline hcl) .... Take 1 1/2 tablet by mouth once a day 7)  Spiriva Handihaler 18 Mcg Caps (Tiotropium bromide monohydrate) .... Inhale 1 capsule as directed once a day 8)  Zyrtec Allergy 10 Mg Tabs (Cetirizine hcl) .... Take 1 tablet by mouth once a day 9)  Caduet 10-10 Mg Tabs (Amlodipine-atorvastatin) .Marland Kitchen.. 1 tablet a day for blood pressure and cholesterol 10)  Metrogel 1 % Gel (Metronidazole) .Marland Kitchen.. 1 applicator full into vagina x 5 days, use 1 time daily. 11)  Midrin 325-65-100 Mg Caps (Apap-isometheptene-dichloral) .... Take 2 tab at onset of headache, may repeat with 1 tablet every 1 up to 5 tablet in 12 hrs. 12)  Fluocinonide 0.05 % Crea (Fluocinonide) .... Appy  to affected area twice a day. disp 60 gram tube 13)  Flexeril 10 Mg Tabs (Cyclobenzaprine hcl) .Marland Kitchen.. 1 tablet by mouth at bedtime 14)  Metformin Hcl 500 Mg Tabs (Metformin hcl) .... Take 1 tablet in am for 1 week, if no gi problems, then increase to 1 tablet twice a day. 15)  Medtronic Glucometer, Strips, and Lancets  .... Needing 1 glucometer, strips #60, lancets #60 disp qs 16)  Fleet Mineral Oil Enem (Mineral oil) .... Use per rectum for relief of constipation.  may repeat in 8 hours if bowel movement does not result. 17)  Miralax Powd (Polyethylene glycol 3350) .Marland Kitchen.. 17g dissolved in 8oz of water daily for relief of constipation.  disp q.s. 30 days 18)  Benefiber Powd (Wheat dextrin) .... Use as directed to increase daily fiber intake. 19)  Darvocet-n 100 100-650 Mg Tabs  (Propoxyphene n-apap) .Marland Kitchen.. 1 tablet every 4 hours for pain, not to exceed 6 tablets in 1 day. 20)  Chantix Starting Month Pak 0.5 Mg X 11 & 1 Mg X 42 Misc (Varenicline tartrate) .... As directed  Lipid Assessment/Plan:      Based on NCEP/ATP III, the patient's risk factor category is "history of diabetes".  From this information, the patient's calculated lipid goals are as follows: Total cholesterol goal is 200; LDL cholesterol goal is 100; HDL cholesterol goal is 40; Triglyceride goal is 150.     Patient Instructions: 1)  Quit date today. 2)  Start Chantix starting month pack when you pick it up today.  3)  Follow dosing in package. 4)  Return to office for f/u in Rx clinic in two weeks.   Prescriptions: CHANTIX STARTING MONTH PAK 0.5 MG X 11 & 1 MG X 42  MISC (VARENICLINE TARTRATE) As directed  #1 x 0   Entered by:   Madelon Lips PHARMD   Authorized by:   Romero Belling MD   Signed by:   Madelon Lips PHARMD on 10/09/2007   Method used:   Telephoned to ...       [facility] [id number]       [address]       [place], [place]  [address]       Ph: [phone]       Fax: [phone]   RxID:   [id number]  ] Tobacco Counseling   Currently uses tobacco.    Cigarettes      Year started smoking cigarettes:      1980     Number of packs per day smoked:      2     Years smoked:            29     Packs per year:          730     Pack Years:            50  Counseled to quit/cut down on tobacco use.   Readiness to Quit:     Very Ready Cessation Stage:     Action Target Quit Date:     10/09/2007  Quitting Barriers:      -  obesity     -  fear of weight gain     -  feels addicted  Quitting Motivators:      -  poor health     -  dyspnea     -  cerebrovascular disease     -  coronary disease     -  healthier lifestyle  Comments: Brand smoked:  [place] 100's   Nicotine Content per Cigarette (mg):     ~1mg  Estimated Nicotine intake per day:  30-40    mg.  Smokes first cigarette: <5    minutes after waking. Estimated Fagerstrom Score: >6 Patient reports readiness to quit on 1-10 scale of:10    Previous Quit Attempts   Previously Tried to quit:     Yes # of Previous quit attempts:     10 Longest Successful Quit Period:   2 weeks  Quit Methods Tried:      -  cold [place]     -  nicotine gum     -  Chantix  Reason for restarting:      -  stress  Smoking Cessation Plan   Readiness:     Very Ready Cessation Stage:   Action Target Quit Date:   10/09/2007  Counseled on:      -  exercise     -  nicotine withdrawal symptoms  New Medications: CHANTIX STARTING MONTH PAK 0.5 MG X 11 & 1 MG X 42  MISC (VARENICLINE TARTRATE) As directed  Medications Added this Update:  CHANTIX STARTING MONTH PAK 0.5 MG X 11 & 1 MG X 42  MISC (VARENICLINE TARTRATE) As directed  Orders Added this Update:  Reassessment Each 15 min unit- [facility] [[id number]]

## 2010-04-13 NOTE — Letter (Signed)
Summary: CMet, Lipase -- wnl  [facility]  [address]   [place], [place] [address]   Phone: [phone]  Fax: [phone]    05/15/2008  [address]  [address] [place], [place]  [address]  Dear [name],   The following are the results of your recent test(s):  Electrolytes -- normal Liver Function Tests -- normal Lipase -- normal  Sincerely,  Romero Belling MD [facility]           Appended Document: CMet, Lipase -- wnl mailed

## 2010-04-13 NOTE — Progress Notes (Signed)
Summary: Call to discuss PET results -- no answer  Phone Note Outgoing Call Call back at [facility] Phone [phone]   Call placed by: Romero Belling MD,  February 12, 2008 4:41 PM Call placed to: Patient Summary of Call: Called patient to inform her of the results of her PET scan.  Patient did not pick up, so I left a message asking her to call clinic this afternoon to discuss results and to come to see me in clinic tomorrow morning in either my 10 am or 11 am slot. Did not inform her of results.  Will make referral to pulmonology for bronchoscopy for biopsy.      Appended Document: Call to discuss PET results -- no answer Will attempt to reach patient again.  Appended Document: Call to discuss PET results -- no answer Reached patient this morning.  Told her results of PET scan indicated likely lung cancer with no signs of mets at this time, and that she would need biopsy.  We are setting up pulmonology appointment and will call her with date/time.  Patient will be seen in my clinic on Friday at 3 pm.

## 2010-04-13 NOTE — Progress Notes (Signed)
Summary: lab results  Phone Note Call from Patient Call back at Home Phone [phone]   Caller: Patient Summary of Call: wants results of lab  Initial call taken by: De Nurse,  May 30, 2008 12:24 PM  Follow-up for Phone Call        will send message to MD. Follow-up by: Theresia Lo RN,  May 30, 2008 1:40 PM  Additional Follow-up for Phone Call Additional follow up Details #1::        No labs to report and none pending. Additional Follow-up by: Romero Belling MD,  June 01, 2008 10:22 PM      Appended Document: lab results Called to discuss with patient that U/S indicates possible kidney problem which needs evaluation.  She is okay with April appointment--was concerned it was something more serious.

## 2010-04-13 NOTE — Assessment & Plan Note (Signed)
Summary: Worsening hot flashes   Vital Signs:  Patient profile:   45 year old female Height:      63 inches Weight:      209.4 pounds BMI:     37.23 O2 Sat:      99 % Temp:     98.8 degrees F Pulse rate:   60 / minute BP sitting:   161 / 106  (left arm)  Vitals Entered By: Theresia Lo RN (October 21, 2008 11:14 AM) CC: worsening hot flashes, some SOB Is Patient Diabetic? Yes  8  Primary Care Provider:  Romero Belling MD  CC:  worsening hot flashes and some SOB.  History of Present Illness: Pt comes in with c/o worsening hot flashes for the last year but has now in the last week has change her daily activities.  Today pt had to leave work early because she was unable to concentrate due to the feeling.  Pt states she has the hot flashes daily, worse in the am and she does state she has been very emotional lately with mood swings and has night sweats most nights of the week which wake her up.  Pt denies fever, chills, nausea, vomiting, diarrhea or constipation but does state she feels sob when she has the hot flashes.   Pt has hx of lung cancer resection early this year and had a hysterectomy at the age of 72 but pt does not know if she still has her ovaries.   Pt blood pressure is elevated and pt states has not been taking diovan because she was unable to get a refill pt has lost weight but pt has been trying to.  Current Medications (verified): 1)  Metoprolol Tartrate 100 Mg  Tabs (Metoprolol Tartrate) .... 2 Tabs By Mouth Two Times A Day 2)  Caduet 10-10 Mg Tabs (Amlodipine-Atorvastatin) .Marland Kitchen.. 1 Tablet A Day For Blood Pressure and Cholesterol 3)  Fluocinonide 0.05 %  Crea (Fluocinonide) .... Appy To Affected Area Twice A Day. Disp 60 Gram Tube. 4)  Pantoprazole Sodium 40 Mg Tbec (Pantoprazole Sodium) .Marland Kitchen.. 1 Tab By Mouth Two Times A Day 5)  Miralax  Powd (Polyethylene Glycol 3350) .Marland KitchenMarland KitchenMarland Kitchen 17 Gm By Mouth Daily. Disp 1 Month Supply. 6)  Sertraline Hcl 100 Mg Tabs (Sertraline Hcl) ....  1/2 Tab By Mouth Daily X2 Weeks, Then 1 Tab By Mouth Daily 7)  Fluticasone Propionate 50 Mcg/act Susp (Fluticasone Propionate) .Marland Kitchen.. 1 Spray Each Nare Once Daily.  Disp Qs X1 Month. 8)  Cetirizine Hcl 10 Mg Tabs (Cetirizine Hcl) .Marland Kitchen.. 1 Tab By Mouth Daily For Allergies 9)  Alprazolam 0.5 Mg Tabs (Alprazolam) .... Take 1/2 To 1 Tab By Mouth Before Flight 10)  Diovan Hct 160-12.5 Mg Tabs (Valsartan-Hydrochlorothiazide) .... Take 1 Pill By Mouth Once Daily 11)  Premarin 0.3 Mg Tabs (Estrogens Conjugated) .... Take 1 Pill By Mouth Once Daily  Allergies (verified): 1)  ! Codeine 2)  ! Penicillin  Past History:  Family History: Last updated: Feb 17, 2008 F- died unknown causes, GF-CAD, GM-DM, htn, M-htn, DM, mom with early menopause, no h/o breat/colon ca, u x2-CAD h/o of 2 MIs. daughter , sister, and cousins with migraines.  mother sometimes has a migraine Moterh with MIs x 2 CVAs in maternal aunt.  .       heart disease: mother, maternal grandfather, maternal uncles cancer: maternal aunts (brain, stomach, lung)   Past medical, surgical, family and social histories (including risk factors) reviewed, and no changes noted (except as noted  below).  Past Medical History: Reviewed history from 02/14/2008 and no changes required. [id number],  retinitis pigmetosa,  Sinusitis depression obesity low back pain disk herniation migraines DM TYPE II- 11/08- new dx     Current Problems:  PULMONARY NODULE (ICD-518.89) CHEST PAIN (ICD-786.50) URINARY FREQUENCY, CHRONIC (ICD-788.41) CONTACT OR EXPOSURE TO OTHER VIRAL DISEASES (ICD-V01.79) ALLERGIC RHINITIS (ICD-477.9) LEG PAIN, BILATERAL (ICD-729.5) CONSTIPATION, INTERMITTENT (ICD-564.00) ABDOMINAL PAIN, RIGHT UPPER QUADRANT (ICD-789.01) NAUSEA AND VOMITING (ICD-787.01) WEIGHT GAIN (ICD-783.1) DIABETES-TYPE 2 (ICD-250.00) AODM (ICD-250.00) POLYURIA (ICD-788.42) DYSURIA (ICD-788.1) SYMPTOM, DISTURBANCE OF SKIN SENSATION (ICD-782.0) HEADACHE,  REBOUND (ICD-784.0) MIGRAINE, COMMON W/O INTRACTABLE MIGRAINE (ICD-346.10) BACK PAIN, CHRONIC (ICD-724.5) TOBACCO DEPENDENCE (ICD-305.1) PEPTIC ULCER DIS., UNSPEC. W/O OBSTRUCTION (ICD-533.90) OSTEOARTHRITIS, LOWER LEG (ICD-715.96) ONYCHOMYCOSIS (ICD-110.1) MENSTRUATION, PAINFUL (ICD-625.3) IRRITABLE BOWEL SYNDROME (ICD-564.1) HYPERTENSION, BENIGN SYSTEMIC (ICD-401.1) HYPERCHOLESTEROLEMIA (ICD-272.0) GLAUCOMA (ICD-365.9) ECZEMA, ATOPIC DERMATITIS (ICD-691.8) DEPRESSION, MAJOR, RECURRENT (ICD-296.30) COPD (ICD-496)    Past Surgical History: Reviewed history from 07/17/2007 and no changes required. BTL - 09/06/2000, c-s x3 - 08/25/2000, eye surgeries - 08/25/2000, Hysterectomy - Partial - 09/11/2001, Spirometry - mild - mod obs dz - 10/12/2004 knee- arthroscopy on R 2009, and repair of torn ligaments on L 2008- Dr. Christian Mate, Dr. Lajoyce Corners did L.     Family History: Reviewed history from 02/14/2008 and no changes required. F- died unknown causes, GF-CAD, GM-DM, htn, M-htn, DM, mom with early menopause, no h/o breat/colon ca, u x2-CAD h/o of 2 MIs. daughter , sister, and cousins with migraines.  mother sometimes has a migraine Moterh with MIs x 2 CVAs in maternal aunt.  .       heart disease: mother, maternal grandfather, maternal uncles cancer: maternal aunts (brain, stomach, lung)   Social History: Reviewed history from 02/14/2008 and no changes required. quit smoking in Sept 2009, former 1 to 1 1/2 ppd smoker since age 59 occas EtOH; pt is single. pt works for [employer].   Review of Systems       see hpi  Physical Exam  General:  Well-developed,well-nourished,in no acute distress; alert,appropriate and cooperative throughout examination Head:  Normocephalic and atraumatic without obvious abnormalities. No apparent alopecia or balding. Eyes:  No corneal or conjunctival inflammation noted. EOMI. Perrla. Neck:  No deformities, masses, or tenderness noted. Chest Wall:  No  deformities, masses, or tenderness noted. Lungs:  Normal respiratory effort, chest expands symmetrically. Lungs are clear to auscultation, no crackles or wheezes. increase sound on left side Heart:  Normal rate and regular rhythm. S1 and S2 normal without gallop, murmur, click, rub or other extra sounds. Abdomen:  soft, normal bowel sounds, no distention, no masses, no guarding, no rigidity, no rebound tenderness, LLQ tenderness, and L flank tenderness.   Msk:  No deformity or scoliosis noted of thoracic or lumbar spine.   Pulses:  R and L carotid,radial,femoral,dorsalis pedis and posterior tibial pulses are full and equal bilaterally  Diabetes Management Exam:    Foot Exam (with socks and/or shoes not present):       Sensory-Pinprick/Light touch:          Left medial foot (L-4): normal          Left dorsal foot (L-5): normal          Left lateral foot (S-1): normal          Right medial foot (L-4): normal          Right dorsal foot (L-5): normal          Right lateral foot (S-1):  normal       Sensory-Monofilament:          Left foot: normal          Right foot: normal       Inspection:          Left foot: normal          Right foot: normal       Nails:          Left foot: normal          Right foot: normal   Impression & Recommendations:  Problem # 1:  MENOPAUSE, EARLY (ICD-627.2)  rtc in 1 month to reasses Her updated medication list for this problem includes:    Premarin 0.3 Mg Tabs (Estrogens conjugated) .Marland Kitchen... Take 1 pill by mouth once daily.  Will continue for 6 months  Orders: [facility]- Est  Level 4 ([address])  Problem # 2:  HYPERTENSION, BENIGN SYSTEMIC (ICD-401.1)  will f/u in 1 month Her updated medication list for this problem includes:    Metoprolol Tartrate 100 Mg Tabs (Metoprolol tartrate) .Marland Kitchen... 2 tabs by mouth two times a day    Caduet 10-10 Mg Tabs (Amlodipine-atorvastatin) .Marland Kitchen... 1 tablet a day for blood pressure and cholesterol    Diovan Hct 160-12.5 Mg Tabs  (Valsartan-hydrochlorothiazide) .Marland Kitchen... Take 1 pill by mouth once daily  Orders: [facility]- Est  Level 4 ([address])  Problem # 3:  OBESITY, UNSPECIFIED (ICD-278.00)  pt continues to try to lose weight  Orders: [facility]- Est  Level 4 ([address])  Complete Medication List: 1)  Metoprolol Tartrate 100 Mg Tabs (Metoprolol tartrate) .... 2 tabs by mouth two times a day 2)  Caduet 10-10 Mg Tabs (Amlodipine-atorvastatin) .Marland Kitchen.. 1 tablet a day for blood pressure and cholesterol 3)  Fluocinonide 0.05 % Crea (Fluocinonide) .... Appy to affected area twice a day. disp 60 gram tube. 4)  Pantoprazole Sodium 40 Mg Tbec (Pantoprazole sodium) .Marland Kitchen.. 1 tab by mouth two times a day 5)  Miralax Powd (Polyethylene glycol 3350) .Marland KitchenMarland Kitchen. 17 gm by mouth daily. disp 1 month supply. 6)  Sertraline Hcl 100 Mg Tabs (Sertraline hcl) .... 1/2 tab by mouth daily x2 weeks, then 1 tab by mouth daily 7)  Fluticasone Propionate 50 Mcg/act Susp (Fluticasone propionate) .Marland Kitchen.. 1 spray each nare once daily.  disp qs x1 month. 8)  Cetirizine Hcl 10 Mg Tabs (Cetirizine hcl) .Marland Kitchen.. 1 tab by mouth daily for allergies 9)  Alprazolam 0.5 Mg Tabs (Alprazolam) .... Take 1/2 to 1 tab by mouth before flight 10)  Diovan Hct 160-12.5 Mg Tabs (Valsartan-hydrochlorothiazide) .... Take 1 pill by mouth once daily 11)  Premarin 0.3 Mg Tabs (Estrogens conjugated) .... Take 1 pill by mouth once daily  Other Orders: Pulse Oximetry- [facility] (91478)  Patient Instructions: 1)  very nice to meet you.  2)  Please take estrogen as prescribed. 3)  will refill diovan for you 4)  Please schedule a follow-up appointment in 1 month.  5)  Tobacco is very bad for your health and your loved ones ! You should stop smoking !  6)  Stop smoking tips: Choose a quit date. Cut down before the quit date. Decide what you will do as a substitute when you feel the urge to smoke(gum, toothpick, exercise).  7)  It is important that you exercise reguarly at least 20 minutes 5 times a week. If you  develop chest pain, have severe difficulty breathing, or feel very tired, stop exercising immediately and seek medical attention.  8)  Check your  Blood Pressure regularly . If it is above:   you should make an appointment. Prescriptions: PREMARIN 0.3 MG TABS (ESTROGENS CONJUGATED) take 1 pill by mouth once daily  #34 x 11   Entered and Authorized by:   Antoine Primas DO   Signed by:   Antoine Primas DO on 10/21/2008   Method used:   Electronically to        [facility] [id number] (retail)       [address] [place], [place]  [address]       Ph: [phone]       Fax: [phone]   RxID:   [id number] DIOVAN HCT 160-12.5 MG TABS (VALSARTAN-HYDROCHLOROTHIAZIDE) take 1 pill by mouth once daily  #34 x 6   Entered and Authorized by:   Antoine Primas DO   Signed by:   Antoine Primas DO on 10/21/2008   Method used:   Electronically to        [facility] [id number] (retail)       [address] [place], [place]  [address]       Ph: [phone]       Fax: [phone]   RxID:   [id number]    Vital Signs:  Patient profile:   45 year old female Height:      63 inches Weight:      209.4 pounds BMI:     37.23 O2 Sat:      99 % Temp:     98.8 degrees F Pulse rate:   60 / minute BP sitting:   161 / 106  (left arm)  Vitals Entered By: Theresia Lo RN (October 21, 2008 11:14 AM)

## 2010-04-13 NOTE — Letter (Signed)
Summary: [facility]   Imported By: Bradly Bienenstock 02/18/2008 10:45:31  _____________________________________________________________________  External Attachment:    Type:   Image     Comment:   External Document

## 2010-04-13 NOTE — Assessment & Plan Note (Signed)
Summary: Abd pain,hx PUD/dfs   History of Present Illness Visit Type: Initial Consult Primary GI MD: Yancey Flemings MD Primary Provider: Romero Belling, MD Chief Complaint: Patient c/o almost constant  LUQ abdominal pain and epigastric pain. She states that the pain does get worse after eating spicy foods. She also has had some nausea and vomiting as well as some odynophagia to solids as well as liquids. She has increased bloating.  History of Present Illness:   45 YO Theresa [name] ,NEW TO GI TODAY REFERRED BY PRIMARY M.D DR. Constance Goltz WITH C/O 3 MONTHS OF CONSTANT EPIGASTRIC AND LUQ PAIN. SHE DESRIBES IT AS A BURNING TEARING PAIN,WHICH IS WORSE IMMEIATELY POST  PRANDIALLY. SHE HAS HAD NAUSEA AS WELL AND INTERMITTENT VOMITING. APPETITE FAIR,WEIGHT STABLE. BMS SOMEWHAT CONSTIPATED.SHE HAD BEEN STARTED ON MIRALAX BUT MAKES HER MORE NAUSEATED. SHE IS ON PROTONIX,HAS BEEN TAKING TWO EACH MORNING.DENIES REGULAR ASA/NSAIDS.SHE HAD INITIALLY BEEN STARTED ON PROTONIX FOR GERD-WHICH IT DOES CONTROL-BUT NOT HELPING THE ABDOMINAL PAIN.  SHE HAS HX OF LUNG CANCER,S/P LULRESECTION 1/10.STAGE 1A NONSMALL CELL ADENOCA. NO RADIATION OR CHEMO.   GI Review of Systems    Reports abdominal pain, acid reflux, heartburn, loss of appetite, nausea, and  vomiting.     Location of  Abdominal pain: upper abdomen.    Denies belching, bloating, chest pain, dysphagia with liquids, dysphagia with solids, vomiting blood, and  weight loss.        Denies anal fissure, black tarry stools, change in bowel habit, constipation, diverticulosis, fecal incontinence, heme positive stool, hemorrhoids, irritable bowel syndrome, jaundice, light color stool, liver problems, rectal bleeding, and  rectal pain. Preventive Screening-Counseling & Management  Caffeine-Diet-Exercise     Does Patient Exercise: yes      Drug Use:  no.      Current Medications (verified): 1)  Metoprolol Tartrate 100 Mg  Tabs (Metoprolol Tartrate) .... 2 Tabs By Mouth Two  Times A Day 2)  Caduet 10-10 Mg Tabs (Amlodipine-Atorvastatin) .Marland Kitchen.. 1 Tablet A Day For Blood Pressure and Cholesterol 3)  Fluocinonide 0.05 %  Crea (Fluocinonide) .... Appy To Affected Area Twice A Day. Disp 60 Gram Tube. 4)  Sertraline Hcl 100 Mg Tabs (Sertraline Hcl) .Marland Kitchen.. 1 Tab By Mouth Daily 5)  Cetirizine Hcl 10 Mg Tabs (Cetirizine Hcl) .Marland Kitchen.. 1 Tab By Mouth Daily For Allergies 6)  Diovan Hct 160-12.5 Mg Tabs (Valsartan-Hydrochlorothiazide) .... Take 1 Pill By Mouth Once Daily 7)  Premarin 0.3 Mg Tabs (Estrogens Conjugated) .... Take 1 Pill By Mouth Once Daily  Allergies (verified): 1)  ! Codeine 2)  ! 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 ADENO CA OF LEFT LUNG 1/10-S/P RESECTION GLAUCOMA HYPERTENSION  Sinusitis depression obesity low back pain disk herniation migraines DM TYPE II- 11/08- new dx      Past Surgical History: BTL - 09/06/2000, c-s x3 - 08/25/2000, eye surgeries - 08/25/2000, Hysterectomy - Partial - 09/11/2001, Spirometry - mild - mod obs dz - 10/12/2004 knee- arthroscopy on R 2009 repair of torn ligaments on L 2008- Dr. Christian Mate, Dr. Lajoyce Corners did L.    Left lung lobectomy-for lung cancer  Family History: F- died unknown causes, GF-CAD, GM-DM, htn, M-htn, DM, mom with early menopause, no h/o breast, u x2-CAD h/o of 2 MIs. daughter , sister, and cousins with migraines.  mother sometimes has a migraine Moterh with MIs x 2 CVAs in maternal aunt.  .     heart disease: mother, maternal grandfather,  maternal uncles cancer: maternal aunts (brain, stomach, lung)  Family History of Colon Cancer: Great Aunt Family History of Liver Cancer:Aunt Family History of Stomach Cancer: Aunt  Social History: quit smoking in Sept 2009, former 1 to 1 1/2 ppd smoker since age 29  pt isMARRIED pt works for [employer].  Alcohol Use - yes-2 x per month Illicit Drug Use - no Patient gets regular  exercise. Daily Caffeine Use-1-2 cups daily Drug Use:  no Does Patient Exercise:  yes  Review of Systems  The patient denies allergy/sinus, anemia, back pain, blood in urine, breast changes/lumps, change in vision, confusion, cough, coughing up blood, depression-new, fainting, fatigue, fever, headaches-new, hearing problems, heart murmur, itching, menstrual pain, muscle pains/cramps, night sweats, nosebleeds, pregnancy symptoms, shortness of breath, skin rash, sleeping problems, sore throat, swelling of feet/legs, swollen lymph glands, thirst - excessive, urination - excessive, urination changes/pain, urine leakage, vision changes, and voice change.         ROS OTHERWISE AS IN HPI  Vital Signs:  Patient profile:   45 year old Theresa [name] Height:      63 inches Weight:      224.38 pounds BMI:     39.89 BSA:     2.03 Pulse rate:   56 / minute Pulse rhythm:   regular BP sitting:   130 / 86  (left arm) Cuff size:   large  Vitals Entered By: Hortense Ramal CMA Duncan Dull) (April 17, 2009 8:12 AM)  Physical Exam  General:  Well developed, well nourished, no acute distress.obese. ,PARTIALLY BLIND  Head:  Normocephalic and atraumatic. Eyes:  PERRLA, no icterus. Lungs:  Clear throughout to auscultation. Heart:  Regular rate and rhythm; no murmurs, rubs,  or bruits. Abdomen:  SOFT, TENDER EPIGASTRIUM,LUQ, NO GUARDING, NO MASS OR HSM,BS+ Rectal:  NOT DONE Extremities:  No clubbing, cyanosis, edema or deformities noted. Neurologic:  Alert and  oriented x4;  grossly normal neurologically. Psych:  Alert and cooperative. Normal mood and affect.   Impression & Recommendations:  Problem # 1:  EPIGASTRIC PAIN (ICD-789.06) Assessment New 45 YO Theresa [name] NEW TO GI TODAY WITH 3 MONTHHX OF FAIRLY CONSTANT EPIGASTRIC AND LUQ PAIN WITH NAUSEA;CURRENTLY REFRACTORY TO PPI THERAPY. NEGATIVE CT SCAN 11/10.  CONTINUE PROTONIX BUT TAKE TWICE DAILY ADD TRIAL OF CARAFATE LIQUID 1 GM 4 TIMES DAILY BETWEEN MEALS  AND AT BEDTIME BLAND DIET LABS AS BELOW SCHEDULE FOR UPPER ENDOSCOPY WITH DR. PERRY;PROCEDURE DISCUSSED IN DETAIL WITH PT. ULTRAM 50 MG Q 6 HOURS AS NEEDED FOR PAIN. Orders: TLB-CMP (Comprehensive Metabolic Pnl) ([id number]) TLB-CBC Platelet - w/Differential (85025-CBCD) TLB-Lipase (83690-LIPASE) EGD (EGD)  Problem # 2:  PERSONAL HX LUNG CANCER (ICD-V10.11) Assessment: Comment Only STAGE 1A NONSMALL CELL ADENOCARCINOMA-S/P LUL RESECTION JAN.2010.  Problem # 3:  HYPERTENSION (ICD-401.9) Assessment: Comment Only  Problem # 4:  HYPERLIPIDEMIA (ICD-272.4) Assessment: Comment Only  Problem # 5:  DIABETES-TYPE 2 (ICD-250.00) Assessment: Comment Only  Problem # 6:  RETINITIS PIGMENTOSA Assessment: Comment Only  Patient Instructions: 1)  Your physician has requested that you have the following labwork done today: Please go to lab basement level.  2)  Endoscopy brochure provided. 3)  Endoscopy scheduled with Dr. Marina Goodell on 04-20-09. 4)  Directions provided. 5)  Copy sent to :  Tama Headings, Md 6)  The medication list was reviewed and reconciled.  All changed / newly prescribed medications were explained.  A complete medication list was provided to the patient / caregiver. Prescriptions: ULTRAM 50 MG TABS (TRAMADOL  HCL) Take 1 tab every 8 hours as needed for pain  #60 x 0   Entered by:   Lowry Ram NCMA   Authorized by:   Sammuel Cooper PA-c   Signed by:   Lowry Ram NCMA on 04/17/2009   Method used:   Electronically to        [facility] [id number] (retail)       [address] [place], [place]  [address]       Ph: [phone]       Fax: [phone]   RxID:   [id number] CARAFATE 1 GM/10ML SUSP (SUCRALFATE) Take 1 gram 4 times daily, between meals and at bedtime.  #120 grams x 1   Entered by:   Lowry Ram NCMA   Authorized by:   Sammuel Cooper PA-c   Signed by:   Lowry Ram NCMA on 04/17/2009   Method used:   Electronically to         [facility] [id number] (retail)       [address] [place], [place]  [address]       Ph: [phone]       Fax: [phone]   RxID:   [id number]

## 2010-04-13 NOTE — Miscellaneous (Signed)
Summary: LDL Update  Clinical Lists Changes  Observations: Added new observation of TDBOOSTDUE: 04/14/2012 (04/18/2008 9:00) Added new observation of HDLNXTDUE: 02/05/2013 (04/18/2008 9:00) Added new observation of LDLNXTDUE: 02/05/2013 (04/18/2008 9:00) Added new observation of PAP DUE: 10/13/2003 (04/18/2008 9:00) Added new observation of HGBA1CNXTDUE: 07/15/2008 (04/18/2008 9:00) Added new observation of MICRALB DUE: 02/01/2008 (04/18/2008 9:00) Added new observation of CREATNXTDUE: 04/17/2009 (04/18/2008 9:00) Added new observation of POTASSIUMDUE: 04/17/2009 (04/18/2008 9:00)     Last HDL:  31 (02/06/2008 8:58:59 AM) HDL Result Date:  02/06/2008 HDL Next Due:  1 yr Last LDL:  113 (02/06/2008 8:58:59 AM) LDL Result Date:  02/06/2008 LDL Next Due:  1 yr

## 2010-04-13 NOTE — Consult Note (Signed)
Summary: [facility]   Imported By: Haydee Salter 06/28/2006 15:01:04  _____________________________________________________________________  External Attachment:    Type:   Image     Comment:   External Document

## 2010-04-13 NOTE — Miscellaneous (Signed)
Summary: FMLA  FMLA form dropped off.  Please call when completed. Bradly Bienenstock  April 20, 2009 4:41 PM  to pcp.Golden Circle RN  April 20, 2009 4:53 PM  done -- placed in to be called box   Romero Belling MD  April 22, 2009 9:19 AM

## 2010-04-13 NOTE — Progress Notes (Signed)
Summary: pls call  Phone Note Call from Patient Call back at Home Phone [phone]   Caller: Patient Summary of Call: pt is returning call Initial call taken by: De Nurse,  May 21, 2008 1:43 PM  Follow-up for Phone Call        Pt sts that she is not sure who called her but that our nuumber came up on her caller ID.  Will forward to MD to see if he may have attempted to contact her about the [place] Follow-up by: Jone Baseman CMA,  May 21, 2008 2:25 PM

## 2010-04-13 NOTE — Progress Notes (Signed)
Summary: workin appt today after 3pm please....  Phone Note Outgoing Call   Call placed by: Wilhemina Bonito  MD,  May 25, 2007 12:35 PM Summary of Call: called to inform pt of lab results.  Pt is not doing any better since being seen by Dr. Mannie Stabile this week. Pt still with abdominal pain, + nausea.   No bowel movement in over 1 week, even with using miralax twice a day and glycerin suppositories.  Pt had abdominal u/s yesterday which had no acute findings, however, mild thickening of renal parenchyma.  Pt complains of pain in upper abdomen and on R side.  Pt had weakly + H pylori test on 3/10.  Pt also describes pressure with urination.  Recommended that pt come in for re-evaluation with rectal exam, + anoscope to look for bleeding source, possible abdominal xray to look for stool/obstruction.  Consider treatment for h pylori if appropriate.   May also increase Miralax to 3-4 times per day and reattempt glycerin suppository prior to appt.  Pt is unable to get a ride until after 3 pm today from her daughter.  Discussed with pt about being seen today.  Will attempt via triage nurse  to schedule for [address] appt with a provider today.  If pt becomes much worse, pt is to go to urgent care for further evaluation.  Pt in agreement with plan.  Initial call taken by: Wilhemina Bonito  MD,  May 25, 2007 12:40 PM  Follow-up for Phone Call        pt has appt at 3pm. has not yet taken any miralax today. advised her to take now Follow-up by: Golden Circle RN,  May 25, 2007 1:31 PM

## 2010-04-13 NOTE — Assessment & Plan Note (Signed)
Summary: discuss u/s and labs   Vital Signs:  Patient profile:   45 year old female Height:      63 inches Weight:      197 pounds BMI:     35.02 BSA:     1.92 Temp:     98.5 degrees F Pulse rate:   54 / minute BP sitting:   126 / 85  Vitals Entered By: Jone Baseman CMA (June 24, 2008 1:56 PM) CC: discuss u/s and labs Pain Assessment Patient in pain? yes     Location: left side Intensity: 7    History of Present Illness: Here to f/u abnormal U/S.  Was seen on 05/14/2008 c/o abdominal pain.  Abdominal U/S to r/o cholecystitis/cholelithiasis.  Normal gall bladder, but echogenic renal parenchyma c/w chronic renal medical disease.  Creatinine 1.0 on 05/14/2008.  Complains of pink urine, and LEFT flank pain, as well as stress incontinence with cough/laugh.  IMPRESSION: 1.  No gallstones.  No ductal dilatation. 2.  Echogenic renal parenchyma consistent with chronic renal medical disease.  Correlate with renal laboratory values. 3.  Left pleural effusion.   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 w/o Obstruction  (ICD-533.90) 11)  Osteoarthritis, Lower Leg  (ICD-715.96) 12)  Irritable Bowel Syndrome  (ICD-564.1) 13)  Hypertension, Benign Systemic  (ICD-401.1) 14)  Hypercholesterolemia  (ICD-272.0) 15)  Glaucoma  (ICD-365.9) 16)  Depression, Major, Recurrent  (ICD-296.30)  Current Medications (verified): 1)  Metoprolol Tartrate 100 Mg  Tabs (Metoprolol Tartrate) .... 2 Tabs By Mouth Two Times A Day 2)  Caduet  10-10 Mg Tabs (Amlodipine-Atorvastatin) .Marland Kitchen.. 1 Tablet A Day For Blood Pressure and Cholesterol 3)  Fluocinonide 0.05 %  Crea (Fluocinonide) .... Appy To Affected Area Twice A Day. Disp 60 Gram Tube. 4)  Pantoprazole Sodium 40 Mg Tbec (Pantoprazole Sodium) .Marland Kitchen.. 1 Tab By Mouth Two Times A Day 5)  Miralax  Powd (Polyethylene Glycol 3350) .Marland KitchenMarland KitchenMarland Kitchen 17 Gm By Mouth Daily. Disp 1 Month Supply. 6)  Sertraline Hcl 100 Mg Tabs (Sertraline Hcl) .... 1/2 Tab By Mouth Daily X2 Weeks, Then 1 Tab By Mouth Daily  Allergies (verified): 1)  ! Codeine 2)  ! Penicillin  Social History:    Smoking Status:  never  Physical Exam  General:  Alert and oriented x3, obese, no acute distress Lungs:  Clear to auscultation bilateral with normal work of breathing. Heart:  Regular rate and rhythm without murmurs, rubs, or gallops.  Normal precordium. Abdomen:  LEFT CVA tenderness   Impression & Recommendations:  Problem # 1:  NONSPC ABN FINDNG RAD&OTH EXAM OTH INTRTHOR ORGN (ICD-793.2) Abnormal renal parenchyma incidentally found on U/S.  Normal Cr.  Will check U/A, microablubmin today.  Control DM, HTN. Orders: [facility]- Est  Level 4 (99214) Urinalysis-[facility] (00000) UA Microalbumin-[facility] (50093)  Problem # 2:  URINARY FREQUENCY, CHRONIC (ICD-788.41) Assessment: Unchanged sounds like stress incontinence--given pelvic floor exercise handout.  Complete Medication List: 1)  Metoprolol Tartrate 100 Mg Tabs (Metoprolol tartrate) .... 2 tabs by mouth two times a day  2)  Caduet 10-10 Mg Tabs (Amlodipine-atorvastatin) .Marland Kitchen.. 1 tablet a day for blood pressure and cholesterol 3)  Fluocinonide 0.05 % Crea (Fluocinonide) .... Appy to affected area twice a day. disp 60 gram tube. 4)  Pantoprazole Sodium 40 Mg Tbec (Pantoprazole sodium) .Marland Kitchen.. 1 tab by mouth two times a day 5)  Miralax Powd (Polyethylene glycol 3350) .Marland KitchenMarland Kitchen. 17 gm by mouth daily. disp 1 month supply. 6)  Sertraline Hcl 100 Mg Tabs (Sertraline hcl) .... 1/2 tab by mouth daily  x2 weeks, then 1 tab by mouth daily  Patient Instructions: 1)  I'll let you know the results of the urine test today.  For the meantime, the best thing to do is minimize your risk factors for kidney disease--you are already doing this by controlling your Hypertension and Diabetes. 2)  I have given you information on pelvic floor exercises which should help with losing your urine (stress incontinence). 3)  Please schedule a follow-up appointment in 3 months .  Prescriptions: PANTOPRAZOLE SODIUM 40 MG TBEC (PANTOPRAZOLE SODIUM) 1 tab by mouth two times a day  #60 x 6   Entered and Authorized by:   Romero Belling MD   Signed by:   Romero Belling MD on 06/24/2008   Method used:   Electronically to        [facility] [address] (retail)       [address] [place], [place]  [address]       Ph: [phone]       Fax: [phone]   RxID:   [id number] FLUOCINONIDE 0.05 %  CREA (FLUOCINONIDE) appy to affected area twice a day. disp 60 gram tube.  #1 x 0   Entered and Authorized by:   Romero Belling MD   Signed by:   Romero Belling MD on 06/24/2008   Method used:   Electronically to        [facility] [address] (retail)       [address] [place], [place]  [address]       Ph: [phone]       Fax: [phone]   RxID:   [id number]   Appended Document: discuss u/s and labs

## 2010-04-13 NOTE — Miscellaneous (Signed)
Summary: Dermatology  Received letter from:  Floyce Stakes, MD, at [facility].  Diagnosis:  Minimal activity on exam today s/p fluocinil and more clinically consistent with atopic dermatitis, complaining of severe pruritus.  Treatment:  Trial clobetasol ointment, hydroxyzine 25 mg 1-2 tab by mouth q6h as needed pruritus, educated on good skin care, f/u 1 month.

## 2010-04-13 NOTE — Miscellaneous (Signed)
Summary: Refills  Clinical Lists Changes  Medications: Changed medication from CADUET 10-10 MG TABS (AMLODIPINE-ATORVASTATIN) 1 tablet a day for blood pressure and cholesterol to CADUET 10-10 MG TABS (AMLODIPINE-ATORVASTATIN) 1 tablet a day for blood pressure and cholesterol - Signed Changed medication from ALBUTEROL 90 MCG/ACT AERS (ALBUTEROL) Inhale 2 puff as directed every four hours to VENTOLIN HFA 108 (90 BASE) MCG/ACT AERS (ALBUTEROL SULFATE) Inhale 2 puffs q4h as directed - Signed Changed medication from FLUOCINONIDE 0.05 %  CREA (FLUOCINONIDE) appy to affected area twice a day. disp 60 gram tube to FLUOCINONIDE 0.05 %  CREA (FLUOCINONIDE) appy to affected area twice a day. disp 60 gram tube - Signed Rx of CADUET 10-10 MG TABS (AMLODIPINE-ATORVASTATIN) 1 tablet a day for blood pressure and cholesterol;  #34 x 12;  Signed;  Entered by: Romero Belling MD;  Authorized by: Romero Belling MD;  Method used: Electronically to [facility] [address], [place], [place], [place]  [address], Ph: [phone], Fax: [phone] Rx of VENTOLIN HFA 108 (90 BASE) MCG/ACT AERS (ALBUTEROL SULFATE) Inhale 2 puffs q4h as directed;  #1 x 12;  Signed;  Entered by: Romero Belling MD;  Authorized by: Romero Belling MD;  Method used: Electronically to [facility] [address] [place], [place], [place]  [address], Ph: [phone], Fax: [phone] Rx of FLUOCINONIDE 0.05 %  CREA (FLUOCINONIDE) appy to affected area twice a day. disp 60 gram tube;  #1 x 1;  Signed;  Entered by: Romero Belling MD;  Authorized by: Romero Belling MD;  Method used: Electronically to [facility] [address] [place], [place], [place]  [address], Ph: [phone], Fax: [phone]    Prescriptions: FLUOCINONIDE 0.05 %  CREA (FLUOCINONIDE) appy to affected area twice a day. disp 60 gram tube  #1 x 1   Entered and Authorized by:   Romero Belling MD   Signed by:   Romero Belling MD  on 12/14/2007   Method used:   Electronically to        [facility] [address] (retail)       [address]       [place], [place]  [address]       Ph: [phone]       Fax: [phone]   RxID:   [id number] VENTOLIN HFA 108 (90 BASE) MCG/ACT AERS (ALBUTEROL SULFATE) Inhale 2 puffs q4h as directed  #1 x 12   Entered and Authorized by:   Romero Belling MD   Signed by:   Romero Belling MD on 12/14/2007   Method used:   Electronically to        [facility] [address] (retail)       [address] [place], [place]  [address]       Ph: [phone]       Fax: [phone]   RxID:   [id number] CADUET 10-10 MG TABS (AMLODIPINE-ATORVASTATIN) 1 tablet a day for blood pressure and cholesterol  #34 x 12   Entered and Authorized by:   Romero Belling MD   Signed by:   Romero Belling MD on 12/14/2007   Method used:   Electronically to        [facility] [address] (retail)       [address]       [place], [place]  [address]       Ph: [phone]       Fax: [phone]   RxID:   [id number]

## 2010-04-13 NOTE — Consult Note (Signed)
Summary: [facility]   [facility]   Imported By: Clydell Hakim 11/27/2008 11:56:46  _____________________________________________________________________  External Attachment:    Type:   Image     Comment:   External Document

## 2010-04-13 NOTE — Consult Note (Signed)
Summary: [facility]  [facility]   Imported By: Haydee Salter 08/09/2006 15:43:02  _____________________________________________________________________  External Attachment:    Type:   Image     Comment:   External Document

## 2010-04-13 NOTE — Letter (Signed)
Summary: Generic Letter  [facility]  [address]   [place], [place] [address]   Phone: [phone]  Fax: [phone]    02/06/2007  [facility] [address] [place], [place]  [address]  Dear Ms. [name],  Your labwork was within goals except for your glucose level, which we knew about, and is why we are starting you on metformin.  Your thyroid and cholesterol panels are within your goal ranges, except for your good cholesterol.  At this time we can work on this with Diet and exercise.  Please follow up in 1 month to discuss your Diabetes and blood pressure.  Please attend your Diabetes Education classes.    Sodium                    139 mEq/L                   135-145   Potassium                 3.5 mEq/L                   3.5-5.3   Chloride                  101 mEq/L                   96-112   CO2                       25 mEq/L                    19-32   Glucose              [H]  116 mg/dL                   57-84   BUN                       11 mg/dL                    6-96   Creatinine                1.20 mg/dL                  0.40-1.20   Bilirubin, Total          0.9 mg/dL                   2.9-5.2   Alkaline Phosphatase      47 U/L                      39-117   AST/SGOT                  14 U/L                      0-37   ALT/SGPT                  14 U/L                      0-35   Total Protein             7.3 g/dL  6.0-8.3   Albumin                   4.5 g/dL                    1.6-1.0   Calcium                   9.4 mg/dL                   9.6-04.5  Tests: (2) Lipid Profile (40981)   Cholesterol               168 mg/dL                   1-914     ATP III Classification:           < 200        mg/dL        Desirable          200 - 239     mg/dL        Borderline High          >= 240        mg/dL        High         Triglyceride              125 mg/dL                   <782   HDL Cholesterol      [L]  36 mg/dL                     >95   Total Chol/HDL Ratio      4.7 Ratio  VLDL Cholesterol (Calc)                             25 mg/dL                    6-21  LDL Cholesterol (Calc)                        [H]  107 mg/dL                   3-08           Total Cholesterol/HDL Ratio:CHD Risk                            Coronary Heart Disease Risk Table                                            Men       Women              1/2 Average Risk              3.4        3.3                  Average Risk              5.0        4.4  2 X Average Risk              9.6        7.1              3 X Average Risk             23.4       11.0     Use the calculated Patient Ratio above and the CHD Risk table      to determine the patient's CHD Risk.     ATP III Classification (LDL):           < 100        mg/dL         Optimal          100 - 129     mg/dL         Near or Above Optimal          130 - 159     mg/dL         Borderline High          160 - 189     mg/dL         High           > 190        mg/dL         Very High      Tests: (3) TSH (23280)   TSH                       0.604 uIU/mL                0.350-5.50     ***Test methodology is 3rd generation TSH*** Sincerely,   Theresa Chandler  MD [facility]  Appended Document: Generic Letter sent

## 2010-04-13 NOTE — Assessment & Plan Note (Signed)
Summary: f/u lung ca   Vital Signs:  Patient Profile:   45 Years Old Female Height:     62 inches Weight:      222.3 pounds O2 Sat:      99 % Temp:     98.4 degrees F BP sitting:   146 / 91  Pt. in pain?   yes    Location:   chest    Intensity:   8    Type:       muscle spasm  Vitals Entered By: Theresia Lo RN (February 20, 2008 10:09 AM)              Is Patient Diabetic? No    Flu Vaccine Consent Questions     Do you have a history of severe allergic reactions to this vaccine? no    Any prior history of allergic reactions to egg and/or gelatin? no    Do you have a sensitivity to the preservative Thimersol? no    Do you have a past history of Guillan-Barre Syndrome? no    Do you currently have an acute febrile illness? no    Have you ever had a severe reaction to latex? no    Vaccine information given and explained to patient? yes    Are you currently pregnant? no   Do you have Asthma? no   Lot Number:[id number]   Exp Date:07/08/2008   Site Given  left  Deltoid PATRICIA [facility] RN  February 20, 2008 11:43 AM    PCP:  Romero Belling MD  Chief Complaint:  short of breath and anxious.  History of Present Illness: History of present illness is detailed by problem in assessment and plan.    Updated Prior Medication List: ADVAIR DISKUS 500-50 MCG/DOSE MISC (FLUTICASONE-SALMETEROL) Inhale 1 puff as directed twice a day VENTOLIN HFA 108 (90 BASE) MCG/ACT AERS (ALBUTEROL SULFATE) Inhale 2 puffs q4h as directed METOPROLOL TARTRATE 100 MG  TABS (METOPROLOL TARTRATE) 2 tabs by mouth bid CADUET 10-10 MG TABS (AMLODIPINE-ATORVASTATIN) 1 tablet a day for blood pressure and cholesterol FLUOCINONIDE 0.05 %  CREA (FLUOCINONIDE) appy to affected area twice a day. disp 60 gram tube METFORMIN HCL 500 MG TABS (METFORMIN HCL) Take 1 tablet by mouth two times a day * MEDTRONIC GLUCOMETER, STRIPS, AND LANCETS needing 1 glucometer, strips #60, lancets #60 disp QS PANTOPRAZOLE SODIUM 40 MG  TBEC (PANTOPRAZOLE SODIUM) 1 tab by mouth daily LISINOPRIL-HYDROCHLOROTHIAZIDE 10-12.5 MG TABS (LISINOPRIL-HYDROCHLOROTHIAZIDE) 1 tab by mouth daily MORPHINE SULFATE 15 MG TABS (MORPHINE SULFATE) 1/2 - 1 tab by mouth q4h as needed for pain or shortness of breath SULFAMETHOXAZOLE-TRIMETHOPRIM 400-80 MG TABS (SULFAMETHOXAZOLE-TRIMETHOPRIM) 1 tab by mouth two times a day x10 days for sinusitis DOCUSATE SODIUM 100 MG CAPS (DOCUSATE SODIUM) 1 tab by mouth daily to prevent constipation while taking morphine  Current Allergies (reviewed today): No known allergies     Risk Factors:  PAP Smear History:     Date of Last PAP Smear:  10/13/2002     Physical Exam  General:     Alert and oriented x3, obese, no acute distress Lungs:     Normal respiratory effort, chest expands symmetrically. Lungs are clear to auscultation, no crackles or wheezes. Heart:     Normal rate and regular rhythm. S1 and S2 normal without gallop, murmur, click, rub or other extra sounds. Skin:     Intact without suspicious lesions or rashes    Impression & Recommendations:  Problem # 1:  PULMONARY  NODULE (ICD-518.89) Assessment: Deteriorated Patient being followed by pulmonology.  Biopsy tomorrow.  9/10 chest pain.  Feels anxious and short of breath frequently.  Pulse ox is normal in clinic today and no wheeze on exam.  Had long discussion of her feelings about the diagnosis and process and what possible treatment options there were.  Advised that we would have to wait for biopsy results to discuss specifics.  Also discussed her anxiety about the diagnosis.  She is very sad, but feels well supported by her family at this time.  A/P:  Will f/u in 1 week to discuss biopsy.  Prescribed morphine by mouth as needed which should help with chest pain and dyspnea. Greater than 30 minutes spent face to face, 50% spent in counselling. Orders: Pulse Oximetry- [facility] (94760) [facility]- Est  Level 4 (99214) CXR- 2view (CXR)    Complete Medication List: 1)  Advair Diskus 500-50 Mcg/dose Misc (Fluticasone-salmeterol) .... Inhale 1 puff as directed twice a day 2)  Ventolin Hfa 108 (90 Base) Mcg/act Aers (Albuterol sulfate) .... Inhale 2 puffs q4h as directed 3)  Metoprolol Tartrate 100 Mg Tabs (Metoprolol tartrate) .... 2 tabs by mouth bid 4)  Caduet 10-10 Mg Tabs (Amlodipine-atorvastatin) .Marland Kitchen.. 1 tablet a day for blood pressure and cholesterol 5)  Fluocinonide 0.05 % Crea (Fluocinonide) .... Appy to affected area twice a day. disp 60 gram tube 6)  Metformin Hcl 500 Mg Tabs (Metformin hcl) .... Take 1 tablet by mouth two times a day 7)  Medtronic Glucometer, Strips, and Lancets  .... Needing 1 glucometer, strips #60, lancets #60 disp qs 8)  Pantoprazole Sodium 40 Mg Tbec (Pantoprazole sodium) .Marland Kitchen.. 1 tab by mouth daily 9)  Lisinopril-hydrochlorothiazide 10-12.5 Mg Tabs (Lisinopril-hydrochlorothiazide) .Marland Kitchen.. 1 tab by mouth daily 10)  Morphine Sulfate 15 Mg Tabs (Morphine sulfate) .... 1/2 - 1 tab by mouth q4h as needed for pain or shortness of breath 11)  Sulfamethoxazole-trimethoprim 400-80 Mg Tabs (Sulfamethoxazole-trimethoprim) .Marland Kitchen.. 1 tab by mouth two times a day x10 days for sinusitis 12)  Docusate Sodium 100 Mg Caps (Docusate sodium) .Marland Kitchen.. 1 tab by mouth daily to prevent constipation while taking morphine  Other Orders: H1N1 vaccine (Z6109) Influenza A (H1N1) w/ Phy couseling (U0454)   Patient Instructions: 1)  I have prescribed morphine for your pain and shortness of breath.  Please do not take any tomorrow prior to your biopsy.  Please make sure you take your first dose while someone is with you to see how you react to it. 2)  I have prescribed a 10 day course of antibiotics for your sinusitis. 3)  I have changed your Diovan to Lisinopril/HCTZ. 4)  Please schedule a follow-up appointment in 1 week to discuss your symptoms and biopsy results. 5)  Please call our clinic if your shortness of breath gets worse.    Prescriptions: DOCUSATE SODIUM 100 MG CAPS (DOCUSATE SODIUM) 1 tab by mouth daily to prevent constipation while taking morphine  #30 x 2   Entered and Authorized by:   Romero Belling MD   Signed by:   Romero Belling MD on 02/20/2008   Method used:   Electronically to        [facility] [address] (retail)       [address] [place], [place]  [address]       Ph: [phone]       Fax: [phone]   RxID:  [id number] SULFAMETHOXAZOLE-TRIMETHOPRIM 400-80 MG TABS (SULFAMETHOXAZOLE-TRIMETHOPRIM) 1 tab by mouth two times a day x10 days for sinusitis  #20 x 0   Entered and Authorized by:   Romero Belling MD   Signed by:   Romero Belling MD on 02/20/2008   Method used:   Print then Give to Patient   RxID:   [id number] MORPHINE SULFATE 15 MG TABS (MORPHINE SULFATE) 1/2 - 1 tab by mouth q4h as needed for pain or shortness of breath  #60 x 0   Entered and Authorized by:   Romero Belling MD   Signed by:   Romero Belling MD on 02/20/2008   Method used:   Print then Give to Patient   RxID:   [id number] LISINOPRIL-HYDROCHLOROTHIAZIDE 10-12.5 MG TABS (LISINOPRIL-HYDROCHLOROTHIAZIDE) 1 tab by mouth daily  #30 x 11   Entered and Authorized by:   Romero Belling MD   Signed by:   Romero Belling MD on 02/20/2008   Method used:   Electronically to        [facility] [address] (retail)       [address]       [place], [place]  [address]       Ph: [phone]       Fax: [phone]   RxID:   [id number]  ]

## 2010-04-13 NOTE — Assessment & Plan Note (Signed)
Summary: recurrent bacterial infection/eo   Vital Signs:  Patient Profile:   45 Years Old Female Height:     62 inches Weight:      220 pounds Temp:     98.2 degrees F Pulse rate:   61 / minute BP sitting:   135 / 73  Vitals Entered By: Jone Baseman CMA (September 26, 2007 9:19 AM)                 Visit Type:  Acute Visit PCP:  Romero Belling MD  Chief Complaint:  recurrent baterial infections.  History of Present Illness: 45 yo AAF who presents c/o recurrent BV and urinary frequency.  1.  Recurrent BV - pt reports recurrent BV infections.  Treated  ~ 1 month ago with Flagyl, but white discharge has returned and has fishy odor.  Is currently sexually active - not using condoms.  Would also like GC/CT testing.  Denies fever, chills, nausea, vomiting, vaginal itching or irritation.  Does report intermittant chronic LLQ pain (still has ovary on this side per report), but otherwise no abdominal pain.  2.  Urinary Frequency - chronic for months.  Denies fever, chills, flank pain as above.  Has not had recent UA.  No hematuria.   A      Review of Systems      See HPI   Physical Exam  General:     Overweight-appearing, no acute distress.  Alert and oriented x 3.  Abdomen:     Normoactive bowel sounds.  Soft, mild tenderness of LLQ, but otherwise non-tender, non-distended.  No rebound/guarding. Genitalia:     Pelvic: Normal external female genitalia without lesions. Normal pink, moist vaginal mucosa with copious thick white discharge and mild irritation.   Cervix not visualized or palpated - s/p Hyst. Uterus absent. Left Adnexae mildly tender to palpation; no masses appreciated, though exam limited 2/2 habitus. GC/CT, Wet prep obtained.     Impression & Recommendations:  Problem # 1:  VAGINAL DISCHARGE (ICD-623.5) Assessment: New Wet Prep and GC/CT cultures obtained given unprotected intercourse.  Wet prep negative for any signs of infection.  Advised to use  condoms and or pull out method to decrease recurrence of BV as sperm makes pH of vagina basic which is more favorable for BV infections.  Also advised may try yogurt daily to keep up normal vaginal flora.  Given no evidence of BV today, also recommended patient may try OTC RepHresh cream to help with odor.  Advised may or may not help. [facility]: [facility] [phone]) GC/Chlamydia-[facility] ([id number]/[id number]) [facility]- Est  Level 4 (60454)   Problem # 2:  URINARY FREQUENCY, CHRONIC (ICD-788.41) Assessment: New Performed UA today to r/o infection as cause and is negative except for proteinuria (diabetic).  Given chronic nature, suspect more of a stress incontinence (or mixed Urgency/Stress).  Advised further f/u with PMD. Orders: Urinalysis-[facility] (00000) [facility]- Est  Level 4 (09811)   Complete Medication List: 1)  Advair Diskus 500-50 Mcg/dose Misc (Fluticasone-salmeterol) .... Inhale 1 puff as directed twice a day 2)  Albuterol 90 Mcg/act Aers (Albuterol) .... Inhale 2 puff as directed every four hours 3)  Diovan Hct 160-12.5 Mg Tabs (Valsartan-hydrochlorothiazide) .... Take 1 tablet by mouth once a day 4)  Metoprolol Succinate 200 Mg Tb24 (Metoprolol succinate) .... Take 1 tablet by mouth once a day 5)  Protonix 40 Mg Tbec (Pantoprazole sodium) .... Take 1 tablet by two times a day x 14 days 6)  Sertraline Hcl 100  Mg Tabs (Sertraline hcl) .... Take 1 1/2 tablet by mouth once a day 7)  Spiriva Handihaler 18 Mcg Caps (Tiotropium bromide monohydrate) .... Inhale 1 capsule as directed once a day 8)  Zyrtec Allergy 10 Mg Tabs (Cetirizine hcl) .... Take 1 tablet by mouth once a day 9)  Caduet 10-10 Mg Tabs (Amlodipine-atorvastatin) .Marland Kitchen.. 1 tablet a day for blood pressure and cholesterol 10)  Metrogel 1 % Gel (Metronidazole) .Marland Kitchen.. 1 applicator full into vagina x 5 days, use 1 time daily. 11)  Midrin 325-65-100 Mg Caps (Apap-isometheptene-dichloral) .... Take 2 tab at onset of headache, may repeat with 1 tablet every 1 up  to 5 tablet in 12 hrs. 12)  Fluocinonide 0.05 % Crea (Fluocinonide) .... Appy to affected area twice a day. disp 60 gram tube 13)  Flexeril 10 Mg Tabs (Cyclobenzaprine hcl) .Marland Kitchen.. 1 tablet by mouth at bedtime 14)  Metformin Hcl 500 Mg Tabs (Metformin hcl) .... Take 1 tablet in am for 1 week, if no gi problems, then increase to 1 tablet twice a day. 15)  Medtronic Glucometer, Strips, and Lancets  .... Needing 1 glucometer, strips #60, lancets #60 disp qs 16)  Fleet Mineral Oil Enem (Mineral oil) .... Use per rectum for relief of constipation.  may repeat in 8 hours if bowel movement does not result. 17)  Miralax Powd (Polyethylene glycol 3350) .Marland Kitchen.. 17g dissolved in 8oz of water daily for relief of constipation.  disp q.s. 30 days 18)  Benefiber Powd (Wheat dextrin) .... Use as directed to increase daily fiber intake. 19)  Darvocet-n 100 100-650 Mg Tabs (Propoxyphene n-apap) .Marland Kitchen.. 1 tablet every 4 hours for pain, not to exceed 6 tablets in 1 day. 20)  Amoxicillin 500 Mg Tabs (Amoxicillin) .... 2 tas by mouth daily x 14 days 21)  Clarithromycin 500 Mg Tabs (Clarithromycin) .Marland Kitchen.. 1 tab by mouth daily   Patient Instructions: 1)  Please schedule a follow-up visit with Dr. Constance Goltz to discuss your other issues. 2)  You do not have any evidence of bacterial infection or urinary tract infection today.  3)  You should still follow the recommendations we discussed today to prevent recurrent bacterial infections and protect yourself from sexually transmitted diseases. 4)  You will be contacted in 1-2 days with your remaining lab results.   ] Laboratory Results   Urine Tests  Date/Time Received: September 26, 2007 9:56 AM  Date/Time Reported: September 26, 2007 10:05 AM   Routine Urinalysis   Color: yellow Appearance: Clear Glucose: negative   (Normal Range: Negative) Bilirubin: negative   (Normal Range: Negative) Ketone: negative   (Normal Range: Negative) Spec. Gravity: 1.020   (Normal Range: 1.003-1.035)  Blood: negative   (Normal Range: Negative) pH: 6.5   (Normal Range: 5.0-8.0) Protein: trace   (Normal Range: Negative) Urobilinogen: 0.2   (Normal Range: 0-1) Nitrite: negative   (Normal Range: Negative) Leukocyte Esterace: negative   (Normal Range: Negative)    Comments: ...........test performed by...........Marland KitchenTerese Door, CMA  Date/Time Received: September 26, 2007 9:56 AM  Date/Time Reported: September 26, 2007 10:01 AM   [employer] Source: VAGINAL WBC/hpf: RARE Bacteria/hpf: 3+ Clue cells/hpf: none Yeast/hpf: none Trichomonas/hpf: none Comments: ...........test performed by...........Marland KitchenTerese Door, CMA

## 2010-04-13 NOTE — Assessment & Plan Note (Signed)
Summary: migraine medication/fu/el  Medications Added ADVAIR DISKUS 500-50 MCG/DOSE MISC (FLUTICASONE-SALMETEROL) Inhale 1 puff as directed twice a day ALBUTEROL 90 MCG/ACT AERS (ALBUTEROL) Inhale 2 puff as directed every four hours DIOVAN HCT 160-12.5 MG TABS (VALSARTAN-HYDROCHLOROTHIAZIDE) Take 1 tablet by mouth once a day METOPROLOL SUCCINATE 200 MG TB24 (METOPROLOL SUCCINATE) Take 1 tablet by mouth once a day PROTONIX 40 MG TBEC (PANTOPRAZOLE SODIUM) Take 1 tablet by mouth once a day SERTRALINE HCL 100 MG TABS (SERTRALINE HCL) Take 1 1/2 tablet by mouth once a day SPIRIVA HANDIHALER 18 MCG CAPS (TIOTROPIUM BROMIDE MONOHYDRATE) Inhale 1 capsule as directed once a day ZYRTEC ALLERGY 10 MG TABS (CETIRIZINE HCL) Take 1 tablet by mouth once a day CADUET 10-10 MG TABS (AMLODIPINE-ATORVASTATIN) 1 tablet a day for blood pressure and cholesterol METROGEL 1 % GEL (METRONIDAZOLE) 1 applicator full into vagina x 5 days, use 1 time daily. MIDRIN 325-65-100 MG CAPS (APAP-ISOMETHEPTENE-DICHLORAL) take 2 tab at onset of headache, may repeat with 1 tablet every 1 up to 5 tablet in 12 hrs.        Vital Signs:  Patient Profile:   45 Years Old Female Weight:      214 pounds Pulse rate:   72 / minute BP sitting:   156 / 85  Vitals Entered By: Lillia Pauls CMA (December 11, 2006 4:31 PM)                 Chief Complaint:  check ears/fu migraines/vag d/c--------------[employer].  History of Present Illness: migraine headaches- whoppers, feels like she wants to faint.  getting them when she is really stressed out.  may come from out of blue.  starts in back of head, sratrs on L side, on temple, becuase always on L side of head.  pain described as sharp and atabbing and shooting into the eye.  when light hits it, feels nauseated and dizzy.  lasts for 2 days.  meds used in past ibuprofen, asa, excedrin migraine,- just made her want to throw up.  when goes to sleep, sometimes goes away.  effects day  vision.  sometimes feels like she wants to pass out.  never tried any 5HRT therapy for migraines, but patient is a smoker, on 4 various BP agents, and with hx of hyperlipidemia so immitrex would not be good for patietn.  Pt has   been to ent and thought that ha were not sinus related.  Pt is on cpap at night.  helping some, but not much.  pt is sleepy at work.  pt sleeping with cpap at night and every night.  sleep study done, pt had apnea.  at work, pt drinks 1 big cup of coffee.  feels pressure on L side of head with some tearing.  always on L side of head.  sometimes bothers her vision.  wants to cry due toanxiety about the pain.  taking some OTC everyday.  Pt also with 1 week duration of L sided decreased sensation mostly on face and arm, but awoke from sleep one night with numbness in arm.  Pt does have a family hx of CAD, MI, and CVAs.   vaginal discharge with slight smell.  self wet prep performed.   needing more medicine for her ezcema    Past Medical History:    Reviewed history from 07/13/2006 and no changes required:       [id number],        retinitis pigmetosa,        Sinusitis  depression       obesity       low back pain       disk herniation       migraines  Past Surgical History:    Reviewed history from 05/11/2006 and no changes required:       BTL - 09/06/2000, c-s x3 - 08/25/2000, eye surgeries - 08/25/2000, Hysterectomy - Partial - 09/11/2001, Spirometry - mild - mod obs dz - 10/12/2004   Family History:    Reviewed history from 05/11/2006 and no changes required:       F- died unknown causes, GF-CAD, GM-DM, htn, M-htn, DM, mom with early menopause, no h/o breat/colon ca, u x2-CAD       daughter , sister, and cousins with migraines.  mother sometimes has a migraine       [place] with MIs x 2       CVAs in maternal aunt.  .    Social History:    Reviewed history from 05/11/2006 and no changes required:       Smoker-20 pack year hx (3-4 ppd); occas EtOH; married with 4  kids     Physical Exam  General:     Well-developed,well-nourished,in no acute distress; alert,appropriate and cooperative throughout examination Head:     Normocephalic and atraumatic without obvious abnormalities. No apparent alopecia or balding.  decreased sensation to monofilament on L side of face and l arm.  No focal weakness.   Eyes:     reninosis pigmentosis so legally blind.  Nose:     External nasal examination shows no deformity or inflammation. Nasal mucosa are pink and moist without lesions or exudates. Mouth:     Oral mucosa and oropharynx without lesions or exudates.  Teeth in good repair. Neck:     No deformities, masses, or tenderness noted. no carotid bruits.  Lungs:     Normal respiratory effort, chest expands symmetrically. Lungs are clear to auscultation, no crackles or wheezes. Heart:     Normal rate and regular rhythm. S1 and S2 normal without gallop, murmur, click, rub or other extra sounds. Neurologic:     alert & oriented X3, cranial nerves II-XII intact, strength normal in all extremities, and sensation intact to light touch, however decreased on L side of face and arm.      Impression & Recommendations:  Problem # 1:  MIGRAINE, COMMON W/O INTRACTABLE MIGRAINE (ICD-346.10) Assessment: New symptoms highly sugestive of migraine, with possible overlying rebound headache with chronic otc medication daily usage.  will not prescribe immitrex or similar agents due to  high number of risk factors for cad.  will prescribe midrin to use 2 tablets at onset of headache, and then repat up to total of 5 tablets in 12 hours.  pt to also have CT head with urgent need with decreased sensation on L side of face to r/o acute bleed or aneursym, even though less likely.  pt does have significant risk factors.  follow up in 1 month.   Her updated medication list for this problem includes:    Metoprolol Succinate 200 Mg Tb24 (Metoprolol succinate) .Marland Kitchen... Take 1 tablet by mouth  once a day    Midrin 325-65-100 Mg Caps (Apap-isometheptene-dichloral) .Marland Kitchen... Take 2 tab at onset of headache, may repeat with 1 tablet every 1 up to 5 tablet in 12 hrs.  Orders: [facility]- Est  Level 4 ([address]) CT (CT)   Problem # 2:  HEADACHE, REBOUND (ICD-784.0) symptoms highly sugestive of migraine, with possible  overlying rebound headache with chronic otc medication daily usage.  will not prescribe immitrex or similar agents due to  high number of risk factors for cad.  will prescribe midrin to use 2 tablets at onset of headache, and then repat up to total of 5 tablets in 12 hours.  pt to also have CT head with urgent need with decreased sensation on L side of face to r/o acute bleed or aneursym, even though less likely.  pt does have significant risk factors.  follow up in 1 month.  Instructed pt to stop using advils and excedrin migraines because likely exacerbating the problem.  may need to refer to headache clinic if persists.   Her updated medication list for this problem includes:    Metoprolol Succinate 200 Mg Tb24 (Metoprolol succinate) .Marland Kitchen... Take 1 tablet by mouth once a day    Midrin 325-65-100 Mg Caps (Apap-isometheptene-dichloral) .Marland Kitchen... Take 2 tab at onset of headache, may repeat with 1 tablet every 1 up to 5 tablet in 12 hrs.  Orders: [facility]- Est  Level 4 (99214)   Problem # 3:  SYMPTOM, DISTURBANCE OF SKIN SENSATION (ICD-782.0) symptoms highly sugestive of migraine, with possible overlying rebound headache with chronic otc medication daily usage.    pt to also have CT head with urgent need with decreased sensation on L side of face to r/o acute bleed or aneursym, even though less likely.  pt does have significant risk factors.  follow up in 1 month.  Instructed pt to quit smoking.  needs to have BP rechecked and cholesterol checked. may need oupt workup for embolic disease if symptoms persist.  pt to continue daily asa usage.   Orders: [facility]- Est  Level 4 ([address]109) CT (CT)   Problem # 4:   VAGINAL DISCHARGE (ICD-623.5) pt performed self wet prep which revealed + clue cells.  will write Rx for metrogel.   [facility]   Complete Medication List: 1)  Advair Diskus 500-50 Mcg/dose Misc (Fluticasone-salmeterol) .... Inhale 1 puff as directed twice a day 2)  Albuterol 90 Mcg/act Aers (Albuterol) .... Inhale 2 puff as directed every four hours 3)  Diovan Hct 160-12.5 Mg Tabs (Valsartan-hydrochlorothiazide) .... Take 1 tablet by mouth once a day 4)  Metoprolol Succinate 200 Mg Tb24 (Metoprolol succinate) .... Take 1 tablet by mouth once a day 5)  Protonix 40 Mg Tbec (Pantoprazole sodium) .... Take 1 tablet by mouth once a day 6)  Sertraline Hcl 100 Mg Tabs (Sertraline hcl) .... Take 1 1/2 tablet by mouth once a day 7)  Spiriva Handihaler 18 Mcg Caps (Tiotropium bromide monohydrate) .... Inhale 1 capsule as directed once a day 8)  Zyrtec Allergy 10 Mg Tabs (Cetirizine hcl) .... Take 1 tablet by mouth once a day 9)  Caduet 10-10 Mg Tabs (Amlodipine-atorvastatin) .Marland Kitchen.. 1 tablet a day for blood pressure and cholesterol 10)  Metrogel 1 % Gel (Metronidazole) .Marland Kitchen.. 1 applicator full into vagina x 5 days, use 1 time daily. 11)  Midrin 325-65-100 Mg Caps (Apap-isometheptene-dichloral) .... Take 2 tab at onset of headache, may repeat with 1 tablet every 1 up to 5 tablet in 12 hrs.   Patient Instructions: 1)  pick up your medications. 2)  take midrin for headaches as bottle states. 3)  stop your over the counter medications advils, excedrin migraine, etc.  still ok to take a daily aspirin a day.   4)  Please schedule a follow-up appointment in 1 month, HTN, Cholest.  .  Prescriptions: MIDRIN 325-65-100 MG CAPS (APAP-ISOMETHEPTENE-DICHLORAL) take 2 tab at onset of headache, may repeat with 1 tablet every 1 up to 5 tablet in 12 hrs.  #30 x 1   Entered and Authorized by:   Wilhemina Bonito  MD   Signed by:   Wilhemina Bonito  MD on 12/11/2006   Method used:   Electronically sent  to ...       [facility] [address]*       [address] [place], [place]  [address]       Ph: [phone]       Fax: [phone]   RxID:   [id number] ZYRTEC ALLERGY 10 MG TABS (CETIRIZINE HCL) Take 1 tablet by mouth once a day  #30 x 4   Entered and Authorized by:   Wilhemina Bonito  MD   Signed by:   Wilhemina Bonito  MD on 12/11/2006   Method used:   Electronically sent to ...       [facility] [address]       [address] [place], [place]  [address]       Ph: [phone]       Fax: [phone]   RxID:   [id number] SPIRIVA HANDIHALER 18 MCG CAPS (TIOTROPIUM BROMIDE MONOHYDRATE) Inhale 1 capsule as directed once a day  #30 x 4   Entered and Authorized by:   Wilhemina Bonito  MD   Signed by:   Wilhemina Bonito  MD on 12/11/2006   Method used:   Electronically sent to ...       [facility] [address]       [address] [place], [place]  [address]       Ph: [phone]       Fax: [phone]   RxID:   [id number] PROTONIX 40 MG TBEC (PANTOPRAZOLE SODIUM) Take 1 tablet by mouth once a day  #30 x 4   Entered and Authorized by:   Wilhemina Bonito  MD   Signed by:   Wilhemina Bonito  MD on 12/11/2006   Method used:   Electronically sent to ...       [facility] [address]*       [address] [place], [place]  [address]       Ph: [phone]       Fax: [phone]   RxID:   [id number] METOPROLOL SUCCINATE 200 MG TB24 (METOPROLOL SUCCINATE) Take 1 tablet by mouth once a day  #30 x 4   Entered and Authorized by:   Wilhemina Bonito  MD   Signed by:   Wilhemina Bonito  MD on 12/11/2006   Method used:   Electronically sent to ...       [facility] [address]*       [address] [place], [place]  [address]       Ph: [phone]       Fax: [phone]   RxID:   [id number] ALBUTEROL 90 MCG/ACT AERS (ALBUTEROL) Inhale 2 puff as directed every four hours  #1  x  4   Entered and Authorized by:   Wilhemina Bonito  MD   Signed by:   Wilhemina Bonito  MD on 12/11/2006   Method used:   Electronically sent to ...       [facility] [address]*       [address] [place], [place]  [address]       Ph: [phone]       Fax: [phone]   RxID:   [id number] ADVAIR DISKUS 500-50 MCG/DOSE MISC (FLUTICASONE-SALMETEROL) Inhale 1 puff as directed twice a day  #1 x 4   Entered and Authorized by:   Wilhemina Bonito  MD   Signed by:   Wilhemina Bonito  MD on 12/11/2006   Method used:   Electronically sent to ...       [facility] [address]*       [address] [place], [place]  [address]       Ph: [phone]       Fax: [phone]   RxID:   [id number] DIOVAN HCT 160-12.5 MG TABS (VALSARTAN-HYDROCHLOROTHIAZIDE) Take 1 tablet by mouth once a day  #30 x 4   Entered and Authorized by:   Wilhemina Bonito  MD   Signed by:   Wilhemina Bonito  MD on 12/11/2006   Method used:   Electronically sent to ...       [facility] [address]       [address] [place], [place]  [address]       Ph: [phone]       Fax: [phone]   RxID:   [id number] METROGEL 1 % GEL (METRONIDAZOLE) 1 applicator full into vagina x 5 days, use 1 time daily.  #5 x 0   Entered and Authorized by:   Wilhemina Bonito  MD   Signed by:   Wilhemina Bonito  MD on 12/11/2006   Method used:   Electronically sent to ...       [facility] [address]*       [address] [place], [place]  [address]       Ph: [phone]       Fax: [phone]   RxID:   [id number] CADUET 10-10 MG TABS (AMLODIPINE-ATORVASTATIN) 1 tablet a day for blood pressure and cholesterol  #30 x 6   Entered and Authorized by:   Wilhemina Bonito  MD   Signed by:   Wilhemina Bonito  MD on 12/11/2006   Method used:   Electronically sent to ...       [facility] [address]       [address] [place],  [place]  [address]       Ph: [phone]       Fax: [phone]   RxID:   [id number]  ] Laboratory Results  Date/Time Received: December 11, 2006 4:34 PM  Date/Time Reported: December 11, 2006 4:42 PM   Wet [place]/KOH Source: VAGINAL WBC/hpf: RARE Bacteria/hpf: 3+ Clue cells/hpf: moderate Yeast/hpf: none Trichomonas/hpf: none Comments: ...................................................................DONNA [facility]  December 11, 2006 4:42 PM  Appended Document: MIDRIN RF    Clinical Lists Changes  Medications: Rx of MIDRIN 325-65-100 MG CAPS (APAP-ISOMETHEPTENE-DICHLORAL) take 2 tab at onset of headache, may repeat with 1 tablet every 1 up to 5 tablet in 12 hrs.;  #30 x 1;  Signed;  Entered by: Lillia Pauls CMA;  Authorized by: Wilhemina Bonito  MD;  Method used: Electronic    Prescriptions: MIDRIN 325-65-100 MG CAPS (APAP-ISOMETHEPTENE-DICHLORAL) take 2 tab at onset of headache, may repeat with 1 tablet every 1 up to 5 tablet in 12 hrs.  #30 x 1   Entered by:   Lillia Pauls CMA   Authorized by:   Wilhemina Bonito  MD   Signed by:   Lillia Pauls CMA on 12/12/2006   Method used:   Electronically sent to ...       [facility] [address]       [address] [place], [place]  [address]       Ph: [phone]       Fax: [phone]   RxID:   [id number]
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
## 2010-04-13 NOTE — Progress Notes (Signed)
Summary: Approval for PET scan.       Additional Follow-up for Phone Call Additional follow up Details #2::    [facility] has approved the PET scan. confirmation sheet in md chart box Follow-up by: Golden Circle RN,  February 12, 2008 11:08 AM

## 2010-04-13 NOTE — Miscellaneous (Signed)
Summary: Certificate of medical necessity form  Patients daughter dropped off form to be filled out to go with prescription.  Pharmacy said they cannot fill rx without form.  Call when ready.  Form placed in nurses box. ..................................................................Marland KitchenKATHY HARRELSON  February 14, 2007 4:22 PM   form placed in MD box. ..................................................................Marland KitchenTheresia Lo RN  February 14, 2007 4:50 PM  Appended Document: Certificate of medical necessity form Called pts number, someone answered and advised them to have pt return our call, if pt calls back, her forms are ready for p/u........ERIN LEVAN 02/16/07 @ 4:36 PM

## 2010-04-13 NOTE — Procedures (Signed)
Summary: Upper Endoscopy  Patient: [name] Note: All result statuses are Final unless otherwise noted.  Tests: (1) Upper Endoscopy (EGD)   EGD Upper Endoscopy       DONE     [facility]     [address]     [place], [place]  [address]           ENDOSCOPY PROCEDURE REPORT           PATIENT:  Theresa [name], Theresa [name]  MR#:  #[id number]     BIRTHDATE:  1965-10-02, 43 yrs. old  GENDER:  female           ENDOSCOPIST:  Wilhemina Bonito. Eda Keys, MD     Referred by:  Romero Belling, M.D.           PROCEDURE DATE:  04/20/2009     PROCEDURE:  EGD, diagnostic     ASA CLASS:  Class II     INDICATIONS:  abdominal pain, left upper quadr           MEDICATIONS:   Fentanyl 75 mcg IV, Versed 7 mg IV     TOPICAL ANESTHETIC:  Exactacain Spray           DESCRIPTION OF PROCEDURE:   After the risks benefits and     alternatives of the procedure were thoroughly explained, informed     consent was obtained.  The LB [id number] endoscope was     introduced through the mouth and advanced to the second portion of     the duodenum, without limitations.  The instrument was slowly     withdrawn as the mucosa was fully examined.     <<PROCEDUREIMAGES>>           The upper, middle, and distal third of the esophagus were     carefully inspected and no abnormalities were noted. The z-line     was well seen at the GEJ. The endoscope was pushed into the fundus     which was normal including a retroflexed view. The antrum,gastric     body, first and second part of the duodenum were unremarkable.     Retroflexed views revealed no abnormalities.    The scope was then     withdrawn from the patient and the procedure completed.           COMPLICATIONS:  None           ENDOSCOPIC IMPRESSION:     1) Normal EGD     RECOMMENDATIONS:     1) continue Protonix for GERD     2) My office will arrange for you to have a Contrast enhanced CT     scan of your abdomen and pelvis "LUQ abdominal pain x 3  months"           ______________________________     Wilhemina Bonito. Eda Keys, MD           CC:  Romero Belling, MD, The Patient           n.     eSIGNED:   Wilhemina Bonito. Eda Keys at 04/20/2009 09:46 AM           Chandler-Mawson, Eunice Blase, #[id number]  Note: An exclamation mark (!) indicates a result that was not dispersed into the flowsheet. Document Creation Date: 04/20/2009 9:46 AM _______________________________________________________________________  (1) Order result status: Final Collection or observation date-time: 04/20/2009 09:39 Requested date-time:  Receipt date-time:  Reported date-time:  Referring Physician:  Ordering Physician: Fransico Setters [id number]) Specimen Source:  Source: Launa Grill Order Number: [id number] Lab site:

## 2010-04-13 NOTE — Assessment & Plan Note (Signed)
Summary: consult for lung mass   Referred by:  Romero Belling PCP:  Romero Belling MD  Chief Complaint:  Pulmonary Consult.  History of Present Illness: the pt is a 45 y/o female who I have been asked to see for a lung mass.  She was recently noted to have a 2cm spiculated mass in her left apex by ct chest, and this was found to have a very positive PET signature but no evidence for metastatic disease.  The pt does have a long history of smoking, but has never been exposed to TB or lived in areas endemic for pulmonary fungi.  She has had one episode of hemoptysis, but typically has a cough productive of white mucus.  She has had a decrease in her appetite recently, and has lost at least 30 pounds.  She describes a left parasternal chest discomfort that is sharp in nature.  She also has noted worsening doe over the last 6mos..     Current Allergies: ! CODEINE ! PENICILLIN  Past Medical History:    Reviewed history from 07/17/2007 and no changes required:       [id number],        retinitis pigmetosa,        Sinusitis       depression       obesity       low back pain       disk herniation       migraines       DM TYPE II- 11/08- new dx           Current Problems:        PULMONARY NODULE (ICD-518.89)       CHEST PAIN (ICD-786.50)       URINARY FREQUENCY, CHRONIC (ICD-788.41)       CONTACT OR EXPOSURE TO OTHER VIRAL DISEASES (ICD-V01.79)       ALLERGIC RHINITIS (ICD-477.9)       LEG PAIN, BILATERAL (ICD-729.5)       CONSTIPATION, INTERMITTENT (ICD-564.00)       ABDOMINAL PAIN, RIGHT UPPER QUADRANT (ICD-789.01)       NAUSEA AND VOMITING (ICD-787.01)       WEIGHT GAIN (ICD-783.1)       DIABETES-TYPE 2 (ICD-250.00)       AODM (ICD-250.00)       POLYURIA (ICD-788.42)       DYSURIA (ICD-788.1)       SYMPTOM, DISTURBANCE OF SKIN SENSATION (ICD-782.0)       HEADACHE, REBOUND (ICD-784.0)       MIGRAINE, COMMON W/O INTRACTABLE MIGRAINE (ICD-346.10)       BACK PAIN, CHRONIC (ICD-724.5)     TOBACCO DEPENDENCE (ICD-305.1)       PEPTIC ULCER DIS., UNSPEC. W/O OBSTRUCTION (ICD-533.90)       OSTEOARTHRITIS, LOWER LEG (ICD-715.96)       ONYCHOMYCOSIS (ICD-110.1)       MENSTRUATION, PAINFUL (ICD-625.3)       IRRITABLE BOWEL SYNDROME (ICD-564.1)       HYPERTENSION, BENIGN SYSTEMIC (ICD-401.1)       HYPERCHOLESTEROLEMIA (ICD-272.0)       GLAUCOMA (ICD-365.9)       ECZEMA, ATOPIC DERMATITIS (ICD-691.8)       DEPRESSION, MAJOR, RECURRENT (ICD-296.30)       COPD (ICD-496)          Past Surgical History:    Reviewed history from 07/17/2007 and no changes required:       BTL - 09/06/2000, c-s x3 - 08/25/2000,  eye surgeries - 08/25/2000, Hysterectomy - Partial - 09/11/2001, Spirometry - mild - mod obs dz - 10/12/2004       knee- arthroscopy on R 2009, and repair of torn ligaments on L 2008- Dr. Christian Mate, Dr. Lajoyce Corners did L.      Family History:    Reviewed history from 02/05/2008 and no changes required:       F- died unknown causes, GF-CAD, GM-DM, htn, M-htn, DM, mom with early menopause, no h/o breat/colon ca, u x2-CAD h/o of 2 MIs.       daughter , sister, and cousins with migraines.  mother sometimes has a migraine       Moterh with MIs x 2       CVAs in maternal aunt.  .                         heart disease: mother, maternal grandfather, maternal uncles       cancer: maternal aunts (brain, stomach, lung)   Social History:    Reviewed history from 02/05/2008 and no changes required:       quit smoking in Sept 2009, former 1 to 1 1/2 ppd smoker since age 21 occas EtOH; pt is single.       pt works for [employer].    Risk Factors:  Tobacco use:  quit    Year quit:  Sept 2009    Pack-years:  1 to 1 1/2 ppd    Comments:  started smoking at age 9   PAP Smear History:    Date of Last PAP Smear:  10/13/2002   Review of Systems      See HPI   Vital Signs:  Patient Profile:   45 Years Old Female Height:     62 inches Weight:      228.38 pounds O2 Sat:      100 %  O2 treatment:    Room Air Temp:     98.0 degrees F oral Pulse rate:   74 / minute BP sitting:   154 / 92  (left arm) Cuff size:   regular  Vitals Entered By: Cyndia Diver LPN (February 14, 2008 10:04 AM)             Is Patient Diabetic? Yes Comments Medications reviewed with patient Cyndia Diver LPN  February 14, 2008 10:12 AM      Physical Exam  General:     obese female in nad Eyes:     PERRLA and EOMI.   Nose:     patent without discharge Mouth:     clear Neck:     no jvd, tmg, ln Lungs:     clear to auscultation Heart:     rrr, no mrg Abdomen:     soft and nontender, bs+ Extremities:     no edema noted, pulses intact distally Neurologic:     alert and oriented, moves all 4.      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  At this point, we need to get tissue diagnosis.  I have discussed with her FOB, TTNA, and VATS biopsy with resection.  Given her young age, there is a higher chance this could be a nonmalignant process, and therefore I would like to have tissue diagnosis before sending for resection.  The pt is agreeable  to this.  She needs to stop smoking.  Medications Added to Medication List This Visit: 1)  Metformin Hcl 500 Mg Tabs (Metformin hcl) .... Take 1 tablet by mouth two times a day   Patient Instructions: 1)  will schedule for lung biopsy 2)  will call with results once available.   ]

## 2010-04-13 NOTE — Progress Notes (Signed)
Summary: [place] request  Phone Note Call from Patient   Summary of Call: Pt is requesting to be seen today for back pain and now she is having a hard time walking.  Pt is coming in at 1:30pm this afternoon. Initial call taken by: Haydee Salter,  January 02, 2007 10:22 AM

## 2010-04-13 NOTE — Letter (Signed)
Summary: Out of Work  [facility]  [address]   [place], [place] [address]   Phone: [phone]  Fax: [phone]    January 13, 2009   Employee:  Theresa [name]    To Whom It May Concern:   For Medical reasons, please excuse the above named employee from work for the following dates:  Start:  January 13, 2009   End:  January 14, 2009   If you need additional information, please feel free to contact our office.         Sincerely,    Romero Belling MD

## 2010-04-13 NOTE — Progress Notes (Signed)
Summary: cxl appt  Phone Note Call from Patient   Caller: Patient Summary of Call: pt called to cxl appt for today and resch for 12/9 Initial call taken by: De Nurse,  February 15, 2008 2:38 PM

## 2010-04-13 NOTE — Progress Notes (Signed)
Summary: Blood Test Res  Phone Note Call from Patient Call back at Home Phone [phone]   Caller: Patient Summary of Call: pt checking on blood work that she had back in July. Initial call taken by: Clydell Hakim,  October 20, 2008 2:38 PM  Follow-up for Phone Call        Patient informed of normal TSH, expressed understanding. Follow-up by: Garen Grams LPN,  October 20, 2008 3:18 PM

## 2010-04-13 NOTE — Miscellaneous (Signed)
Summary: Directives and Consents  Directives and Consents   Imported By: Denny Peon LEVAN 12/01/2006 10:47:31  _____________________________________________________________________  External Attachment:    Type:   Image     Comment:   External Document

## 2010-04-13 NOTE — Miscellaneous (Signed)
Summary: IV Contrast + Metformin:  Needs Cr Check  Received FAX from Radiology department that patient received CT with Contrast today and was advised to hold Metformin for 48 hours.  My notes indicate she has not been on Metformin since 05/2008 and her last A1c was 5.9 on 01/13/2009.  Blue Team:  Please call patient to confirm whether or not she is taking Metformin.  If she is, please schedule her for a visit with me to check creatinine and discuss diabetes and instruct her not to take it until we check creatinine and establish that kidney function is normal. Romero Belling MD  May 04, 2009 3:12 PM  Patient states that she is still taking Metformin, informed her of above and scheduled her with PCP for 05/25/09...............................................Marland KitchenGaren Grams LPN May 04, 2009 4:19 PM

## 2010-04-13 NOTE — Assessment & Plan Note (Signed)
Summary: F/U Quit Smoking! - Rx Clinic   Vital Signs:  Patient Profile:   45 Years Old Female Height:     62 inches Weight:      223 pounds BMI:     40.93 Pulse rate:   98 / minute BP sitting:   157 / 93  (left arm)                   Current Allergies (reviewed today): No known allergies     Risk Factors:  Tobacco use:  quit    Year quit:  2009    Pack-years:  58    Comments:  Quit 10/09/2007      Impression & Recommendations:  Problem # 1:  TOBACCO DEPENDENCE (ICD-305.1) Assessment: Improved Previous cigarette smoker who has completely abstained from smoking since July 28th (2 weeks) with the use of Chantix.  She noticed significant irritability during the first week but has noticed this has diminished over the last week.  She is optimistic about staying quit long term.  She will continue on Chanitx at this time.  She is willing to f/U in 3 weeks for smoking cessation.  At that time we will discuss long-term plan for Chantix. Tottal time in face to face follow up and counseling = 25 minutes.  The following medications were removed from the medication list:    Chantix Starting Month Pak 0.5 Mg X 11 & 1 Mg X 42 Misc (Varenicline tartrate) .Marland Kitchen... As directed  Her updated medication list for this problem includes:    Chantix Continuing Month Pak 1 Mg Tabs (Varenicline tartrate) .Marland Kitchen... Take as directed  Orders: Reassessment Each 15 min unit- [facility] ([address])   Problem # 2:  HYPERTENSION, BENIGN SYSTEMIC (ICD-401.1) Assessment: Unchanged Not at goal despite multiple drug therapy.  Given her headache history we may attempt to utilize verapimil in the future in addition to or in place of metoprolol.  She plans to follow up with Dr. Constance Goltz in two weeks.  She anticipates requesing Midrin at that time.  She is currently taking ibuprofen 800mg  as needed headache about 5 days per week.   Minimizing use of ibuprofen may be important for blood pressure control  Her updated medication  list for this problem includes:    Diovan Hct 160-12.5 Mg Tabs (Valsartan-hydrochlorothiazide) .Marland Kitchen... Take 1 tablet by mouth once a day    Metoprolol Tartrate 100 Mg Tabs (Metoprolol tartrate) .Marland Kitchen... 2 tabs by mouth bid    Caduet 10-10 Mg Tabs (Amlodipine-atorvastatin) .Marland Kitchen... 1 tablet a day for blood pressure and cholesterol  Orders: Reassessment Each 15 min unit- [facility] ([address])   Problem # 3:  MIGRAINE, COMMON W/O INTRACTABLE MIGRAINE (ICD-346.10)  The following medications were removed from the medication list:    Darvocet-n 100 100-650 Mg Tabs (Propoxyphene n-apap) .Marland Kitchen... 1 tablet every 4 hours for pain, not to exceed 6 tablets in 1 day.  Her updated medication list for this problem includes:    Metoprolol Tartrate 100 Mg Tabs (Metoprolol tartrate) .Marland Kitchen... 2 tabs by mouth bid    Midrin 325-65-100 Mg Caps (Apap-isometheptene-dichloral) .Marland Kitchen... Take 2 tab at onset of headache, may repeat with 1 tablet every 1 up to 5 tablet in 12 hrs.   Complete Medication List: 1)  Advair Diskus 500-50 Mcg/dose Misc (Fluticasone-salmeterol) .... Inhale 1 puff as directed twice a day 2)  Albuterol 90 Mcg/act Aers (Albuterol) .... Inhale 2 puff as directed every four hours 3)  Diovan Hct 160-12.5 Mg Tabs (Valsartan-hydrochlorothiazide) .Marland KitchenMarland KitchenMarland Kitchen  Take 1 tablet by mouth once a day 4)  Metoprolol Tartrate 100 Mg Tabs (Metoprolol tartrate) .... 2 tabs by mouth bid 5)  Zyrtec Allergy 10 Mg Tabs (Cetirizine hcl) .... Take 1 tablet by mouth once a day 6)  Caduet 10-10 Mg Tabs (Amlodipine-atorvastatin) .Marland Kitchen.. 1 tablet a day for blood pressure and cholesterol 7)  Midrin 325-65-100 Mg Caps (Apap-isometheptene-dichloral) .... Take 2 tab at onset of headache, may repeat with 1 tablet every 1 up to 5 tablet in 12 hrs. 8)  Fluocinonide 0.05 % Crea (Fluocinonide) .... Appy to affected area twice a day. disp 60 gram tube 9)  Metformin Hcl 500 Mg Tabs (Metformin hcl) .... Take 1 tablet in am for 1 week, if no gi problems, then increase to  1 tablet twice a day. 10)  Medtronic Glucometer, Strips, and Lancets  .... Needing 1 glucometer, strips #60, lancets #60 disp qs 11)  Chantix Continuing Month Pak 1 Mg Tabs (Varenicline tartrate) .... Take as directed   Patient Instructions: 1)  Please schedule a follow-up appointment in 3 weeks in Rx clinic. 2)  Continue with current plan using Chantix.  3)  Keep up the great work being a non-smoker! 4)  Please continue to stay busy and pace yourself!   Prescriptions: CHANTIX CONTINUING MONTH PAK 1 MG  TABS (VARENICLINE TARTRATE) Take as directed  #1 x 3   Entered by:   Madelon Lips PHARMD   Authorized by:   Romero Belling MD   Signed by:   Madelon Lips PHARMD on 10/22/2007   Method used:   Electronically sent to ...       [facility] [address]       [address]       [place], [place]  [address]       Ph: [phone]       Fax: [phone]   RxID:   [id number]  ]

## 2010-04-13 NOTE — Miscellaneous (Signed)
Summary: RX [facility]  Clinical Lists Changes  Medications: Added new medication of KLOR-CON 20 MEQ PACK (POTASSIUM CHLORIDE) Take 1 tab twice daily x 5 days - Signed Rx of KLOR-CON 20 MEQ PACK (POTASSIUM CHLORIDE) Take 1 tab twice daily x 5 days;  #10 x 0;  Signed;  Entered by: Lowry Ram NCMA;  Authorized by: Sammuel Cooper PA-c;  Method used: Electronically to [facility] [address] [place], [place], [place]  [address], Ph: [phone], Fax: [phone]    Prescriptions: KLOR-CON 20 MEQ PACK (POTASSIUM CHLORIDE) Take 1 tab twice daily x 5 days  #10 x 0   Entered by:   Lowry Ram NCMA   Authorized by:   Sammuel Cooper PA-c   Signed by:   Lowry Ram NCMA on 04/20/2009   Method used:   Electronically to        [facility] [address] (retail)       [address]       [place], [place]  [address]       Ph: [phone]       Fax: [phone]   RxID:   [id number]

## 2010-04-13 NOTE — Letter (Signed)
Summary: Out of Work  [facility]  [address]   [place], [place] [address]   Phone: [phone]  Fax: [phone]    September 26, 2007   Employee:  Theresa [name]    To Whom It May Concern:   For Medical reasons, please excuse the above named employee from work for the following dates:  Start:  September 26, 2007  End:  September 26, 2007 - patient may return to work this afternoon.  If you need additional information, please feel free to contact our office.         Sincerely,    Drue Dun MD

## 2010-04-13 NOTE — Letter (Signed)
Summary: Gastroenterology Referral  [facility]  [address]   [place], [place] [address]   Phone: [phone]  Fax: [phone]    04/08/2009  Thank you in advance for agreeing to see my patient:  Theresa [name] [address] [place], [place]  [address] Phone: [phone]  Reason for Referral: Epigastric pain with melena.  History of peptic ulcer disease. Procedures Requested: Please evaluate and treat.  Most recent clinic note enclosed.  Thank you again for agreeing to see our patient; please contact us if you have any further questions or need additional information.  Sincerely,   Madlyn Frankel. Constance Goltz, MD

## 2010-04-13 NOTE — Progress Notes (Signed)
Summary: Important message  Phone Note From Other Clinic   Caller: [place] at cardiovascular lab Summary of Call: [place] from Cardiovascular lab at [facility] called to report that pt's venous and arterial doopler studies were both normal.  [place] was concerned and wanted to let Dr. Sandria Manly know that pt reported have spells of chest pain intermittently the last being yesterday.  Pt told [place] that the spells were associated with nausea, and when she touched her chest the pain radiates about 3/4 down her right arm.  [place] also said pt told her she feels dizzy during these times as well.  [place] told pt to discuss with Dr. Sandria Manly esp since pt has family history (her mother had CABG), and pt is newly diagnosed diabetic.  Will forward message to Dr. Sandria Manly. Initial call taken by: AMY MARTIN RN,  Jul 25, 2007 11:41 AM  Follow-up for Phone Call        Spoke with Dr. Sandria Manly who states if pt's chest pain is ongoing she should go to ED for evaluation, but if chest pain has long periods in between episodes then we can see her here.  If pt is having chest pain now, she definitely needs to go to ED now.  Unable to reach pt via the number listed in her chart - unable to leave message.  Home number states telephone number is not valid, work number states "you have reached [employer]".  Called pt's daughter's number.  She is not with her mother currently but is going to her home now.  Will ask her mom to call when she get there. Follow-up by: AMY MARTIN RN,  Jul 25, 2007 12:09 PM  Additional Follow-up for Phone Call Additional follow up Details #1::        Spoke with pt- she states she has been experiencing these chest pain episodes for approx 2 months.  She thought they were gas.  Pt describes the chest pain episodes as pain in the center of her chest, associated with nausea, radiates to her shoulder blade.  She states they happen approx every other day - she has not experienced the pain today.  Advised  pt we need to see her this afternoon as Dr. Sandria Manly is not here tomrorow.  Pt is coming in today at 3 pm. Additional Follow-up by: AMY MARTIN RN,  Jul 25, 2007 12:18 PM

## 2010-04-13 NOTE — Miscellaneous (Signed)
Summary: dnka/ts  Clinical Lists Changes 

## 2010-04-13 NOTE — Progress Notes (Signed)
Summary: APPT SCHEDULED  Phone Note From Other Clinic Call back at [phone]   Caller: SUSAN @ [facility] Summary of Call: FOR LYNN:  PT SCHEDULED WITH DR. Eduard Clos ON 5/16.  PT NEEDS TO ARRIVE BY 1:30.  PLS NOTIFY PT.  CALL IF QUESTIONS. Initial call taken by: Levada Schilling,  Jul 25, 2006 10:08 AM  Follow-up for Phone Call        pt notified of appointment time as above. Follow-up by: Theresia Lo RN,  Jul 26, 2006 8:56 AM

## 2010-04-13 NOTE — Assessment & Plan Note (Signed)
Summary: cold & congestion,tcb   Vital Signs:  Patient profile:   45 year old female Height:      63 inches Weight:      216.8 pounds BMI:     38.54 Temp:     99.0 degrees F oral Pulse rate:   76 / minute BP sitting:   137 / 84  Vitals Entered By: Garen Grams LPN (January 13, 2009 1:35 PM) CC: wheezing, cough, congestion x 1 week Is Patient Diabetic? Yes Did you bring your meter with you today? No Pain Assessment Patient in pain? yes     Location: stomach   Primary Care Provider:  Romero Belling MD  CC:  wheezing, cough, and congestion x 1 week.  History of Present Illness: 45 yo female presenting with:  URI SYMPTOMS.  Wheezing, cough, congestion for 1 week.  Tmax 100.  Intermittent shortness of breath.  Taking Mucinex which provides mild relief but causes nausea.  PSORIASIS.  Involving bilateral hands.  Formerly diagnosed as eczema, but recurs frequently with use of high potency steroid.  Using FLUOCINONIDE which clears rash, but is currently out.  Never evaluated by dermatology.  ABDMONAL MASS.  LUQ mass she has felt for two weeks.  No pain.  History of lung cancer.  Constipated, has not had good BM in several days.  Habits & Providers  Alcohol-Tobacco-Diet     Tobacco Status: quit  Current Medications (verified): 1)  Metoprolol Tartrate 100 Mg  Tabs (Metoprolol Tartrate) .... 2 Tabs By Mouth Two Times A Day 2)  Caduet 10-10 Mg Tabs (Amlodipine-Atorvastatin) .Marland Kitchen.. 1 Tablet A Day For Blood Pressure and Cholesterol 3)  Fluocinonide 0.05 %  Crea (Fluocinonide) .... Appy To Affected Area Twice A Day. Disp 60 Gram Tube. 4)  Pantoprazole Sodium 40 Mg Tbec (Pantoprazole Sodium) .Marland Kitchen.. 1 Tab By Mouth Two Times A Day 5)  Sertraline Hcl 100 Mg Tabs (Sertraline Hcl) .... 1/2 Tab By Mouth Daily X2 Weeks, Then 1 Tab By Mouth Daily 6)  Fluticasone Propionate 50 Mcg/act Susp (Fluticasone Propionate) .Marland Kitchen.. 1 Spray Each Nare Once Daily.  Disp Qs X1 Month. 7)  Cetirizine Hcl 10 Mg Tabs  (Cetirizine Hcl) .Marland Kitchen.. 1 Tab By Mouth Daily For Allergies 8)  Diovan Hct 160-12.5 Mg Tabs (Valsartan-Hydrochlorothiazide) .... Take 1 Pill By Mouth Once Daily 9)  Premarin 0.3 Mg Tabs (Estrogens Conjugated) .... Take 1 Pill By Mouth Once Daily  Allergies (verified): 1)  ! Codeine 2)  ! Penicillin  Physical Exam  Additional Exam:  VITALS:  Reviewed, normal GEN: Alert & oriented, no acute distress, obese NECK: Midline trachea, no masses/thyromegaly, no cervical lymphadenopathy CARDIO: Regular rate and rhythm, no murmurs/rubs/gallops, 2+ bilateral radial pulses RESP: Mild wheeze R>L, normal work of breathing, no retractions/accessory muscle use ABD: Normoactive bowel sounds, nontender, no masses/hepatosplenomegaly    Impression & Recommendations:  Problem # 1:  VIRAL URI (ICD-465.9) Symptomatic relief for now.  Orders: [facility]- Est  Level 4 ([address]782)  Problem # 2:  ABDOMINAL MASS, LEFT UPPER QUADRANT (NFA-213.08) Assessment: New Cannot palpate on exam.  Patient with h/o lung cancer, so will check CT of abdomen.  Precepted with Dr. Jennette Kettle.  Check BMet for Cr prior to contrast study.  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  Needs referral to dermatology.  Orders: [facility]- Est  Level 4 (32440) Dermatology Referral (Derma)  Complete Medication  List: 1)  Metoprolol Tartrate 100 Mg Tabs (Metoprolol tartrate) .... 2 tabs by mouth two times a day 2)  Caduet 10-10 Mg Tabs (Amlodipine-atorvastatin) .Marland Kitchen.. 1 tablet a day for blood pressure and cholesterol 3)  Fluocinonide 0.05 % Crea (Fluocinonide) .... Appy to affected area twice a day. disp 60 gram tube. 4)  Pantoprazole Sodium 40 Mg Tbec (Pantoprazole sodium) .Marland Kitchen.. 1 tab by mouth two times a day 5)  Sertraline Hcl 100 Mg Tabs (Sertraline hcl) .Marland Kitchen.. 1 tab by mouth daily 6)  Fluticasone  Propionate 50 Mcg/act Susp (Fluticasone propionate) .Marland Kitchen.. 1 spray each nare once daily.  disp qs x1 month. 7)  Cetirizine Hcl 10 Mg Tabs (Cetirizine hcl) .Marland Kitchen.. 1 tab by mouth daily for allergies 8)  Diovan Hct 160-12.5 Mg Tabs (Valsartan-hydrochlorothiazide) .... Take 1 pill by mouth once daily 9)  Premarin 0.3 Mg Tabs (Estrogens conjugated) .... Take 1 pill by mouth once daily  Other Orders: A1C-[facility] (98119)  Patient Instructions: 1)  Good to see you today--congratulations on your upcoming wedding, I wish you the best! 2)  Please come for your regular checkups--not just when you are sick. 3)  I have ordered a CT of your abdomen. 4)  I have refilled your cream, but also want you to see the dermatologist--we'll call with an appointment. 5)  You have a cold, which should get better over the next week.  Return to clinic if not better in 1 week. 6)  Please schedule a follow-up appointment in 3 months .  Prescriptions: DIOVAN HCT 160-12.5 MG TABS (VALSARTAN-HYDROCHLOROTHIAZIDE) take 1 pill by mouth once daily  #30 x 3   Entered and Authorized by:   Romero Belling MD   Signed by:   Romero Belling MD on 01/13/2009   Method used:   Electronically to        [facility] [address] (retail)       [address]       [place], [place]  [address]       Ph: [phone]       Fax: [phone]   RxID:   [id number] CETIRIZINE HCL 10 MG TABS (CETIRIZINE HCL) 1 tab by mouth daily for allergies  #30 x 3   Entered and Authorized by:   Romero Belling MD   Signed by:   Romero Belling MD on 01/13/2009   Method used:   Electronically to        [facility] [address] (retail)       [address] [place], [place]  [address]       Ph: [phone]       Fax: [phone]   RxID:   [id number] FLUTICASONE PROPIONATE 50 MCG/ACT SUSP (FLUTICASONE PROPIONATE) 1 spray each nare once daily.  Disp QS x1 month.  #1 x 3   Entered and Authorized by:   Romero Belling MD   Signed by:   Romero Belling MD on 01/13/2009   Method used:   Electronically to        [facility] [address] (retail)       [address] [place], [place]  [address]       Ph: [phone]       Fax: [phone]   RxID:  [id number] SERTRALINE HCL 100 MG TABS (SERTRALINE HCL) 1 tab by mouth daily  #30 x 3   Entered and Authorized by:   Romero Belling MD   Signed by:   Romero Belling MD on 01/13/2009   Method used:   Electronically to        [facility] [address] (retail)       [address]       [place], [place]  [address]       Ph: [phone]       Fax: [phone]   RxID:   [id number] METOPROLOL TARTRATE 100 MG  TABS (METOPROLOL TARTRATE) 2 tabs by mouth two times a day  #120 x 3   Entered and Authorized by:   Romero Belling MD   Signed by:   Romero Belling MD on 01/13/2009   Method used:   Electronically to        [facility] [address] (retail)       [address]       [place], [place]  [address]       Ph: [phone]       Fax: [phone]   RxID:   [id number] CADUET 10-10 MG TABS (AMLODIPINE-ATORVASTATIN) 1 tablet a day for blood pressure and cholesterol  #34 x 3   Entered and Authorized by:   Romero Belling MD   Signed by:   Romero Belling MD on 01/13/2009   Method used:   Electronically to        [facility] [address] (retail)       [address]       [place], [place]  [address]       Ph: [phone]       Fax: [phone]   RxID:   [id number] PANTOPRAZOLE SODIUM 40 MG TBEC (PANTOPRAZOLE SODIUM) 1 tab by mouth two times a day  #60 x 3   Entered and Authorized by:   Romero Belling MD   Signed by:   Romero Belling MD on 01/13/2009   Method used:   Electronically to        [facility] [address] (retail)       [address] [place], [place]  [address]       Ph: [phone]       Fax: [phone]    RxID:   [id number] FLUOCINONIDE 0.05 %  CREA (FLUOCINONIDE) appy to affected area twice a day. disp 60 gram tube.  #1 x 0   Entered and Authorized by:   Romero Belling MD   Signed by:   Romero Belling MD on 01/13/2009   Method used:   Electronically to        [facility] [address] (retail)       [address] [place], [place]  [address]       Ph: [phone]       Fax: [phone]   RxID:   [id number]   Appended Document: cold & congestion,tcb CT SCHEDULED FOR 01/15/09 AT 9:30 AT [facility]. [phone]  Appended Document: A1c  5.9 %    Lab Visit  Laboratory Results  Blood Tests   Date/Time Received: January 13, 2009 2:38 PM  Date/Time Reported: January 13, 2009 2:52 PM   HGBA1C: 5.9 %   (Normal Range: Non-Diabetic - 3-6%   Control Diabetic - 6-8%)  Comments: ...............test performed by......Marland KitchenBonnie A. [place], [place] (ASCP)    Orders Today:

## 2010-04-13 NOTE — Progress Notes (Signed)
  Phone Note Outgoing Call   Summary of Call:  message left fot pt. to call me back to schedule ct  Follow-up for Phone Call        Message left again to return my call to schedule a Ct  Teryl Lucy RN  April 23, 2009 10:38 AM  Follow-up by: Teryl Lucy RN,  April 30, 2009 10:48 AM  Additional Follow-up for Phone Call Additional follow up Details #1::        Talked with pt.  She would like to see if she can have the CT scan done on Wed 2/23.  She can be reached at [phone].  Pt asking why this needs to be done.  Explained no cause has been found for her pain and this will check all other organs. Additional Follow-up by: Ashok Cordia RN,  April 29, 2009 4:44 PM    Additional Follow-up for Phone Call Additional follow up Details #2::    Pt. ntfd. that Ct is scheduled at [facility] on 05/06/2009 at 8:3:0 a.m. and she will need to be at admitting at 8 a.m. Told to go by radiology there to pick up contrast and instructions no later than day prior to test..  Follow-up by: Teryl Lucy RN,  April 30, 2009 10:51 AM

## 2010-04-13 NOTE — Progress Notes (Signed)
Summary: phn msg  Phone Note From Other Clinic Call back at [phone]   Caller: [employer]- short term [employer]- Kristen Summary of Call: needs to know what the tx plan is concerning her lung ca Initial call taken by: De Nurse,  March 21, 2008 10:01 AM  Follow-up for Phone Call        Mistakenly called [name] and left message.  Then called [employer] and spoke with [facility] Ms.  Referred her to Dr. Shelle Iron for treatment plan.  Advised that Ms. Ave Filter was being evaluated for possible surgical resection. Follow-up by: Romero Belling MD,  March 25, 2008 9:51 AM

## 2010-04-13 NOTE — Progress Notes (Signed)
Summary: triage  Phone Note Call from Patient Call back at Home Phone [phone]   Caller: Patient Summary of Call: has ulcer and still has stomach pain, and has a scratch on breast that is infected Initial call taken by: De Nurse,  May 12, 2008 10:04 AM  Follow-up for Phone Call        states the PPI is not helping her stomach pain. also has a scratch that she thinks is infeceted. she is using antibiotic ointment on it. advised her on stomach pain triggers in her diet. states she is aware of them. wants appt with her md only. appt made for Wednesday am Follow-up by: Golden Circle RN,  May 12, 2008 10:09 AM  Additional Follow-up for Phone Call Additional follow up Details #1::        Agreed. Additional Follow-up by: Romero Belling MD,  May 12, 2008 12:30 PM

## 2010-04-13 NOTE — Assessment & Plan Note (Signed)
Summary: MEET NEW DOC/[facility]  Pt had appt with Rx clinic today.  This appt not needed. ............................................... Shanda Bumps [facility] November 06, 2009 11:22 AM  Vital Signs:  Patient profile:   45 year old female Height:      63 inches Weight:      226 pounds BMI:     40.18 BP supine:   166 / 94  (right arm)  Allergies: 1)  ! Codeine 2)  ! 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 [place] CA OF LEFT LUNG 1/10-S/P RESECTION GLAUCOMA HYPERTENSION  Sinusitis depression obesity low back pain disk herniation migraines DM TYPE II- 11/08- new dx       Complete Medication List: 1)  Metoprolol Tartrate 100 Mg Tabs (Metoprolol tartrate) .... 2 tabs by mouth two times a day 2)  Caduet 10-10 Mg Tabs (Amlodipine-atorvastatin) .Marland Kitchen.. 1 tablet a day for blood pressure and cholesterol 3)  Fluocinonide 0.05 % Crea (Fluocinonide) .... Appy to affected area twice a day. disp 60 gram tube. 4)  Sertraline Hcl 100 Mg Tabs (Sertraline hcl) .Marland Kitchen.. 1 tab by mouth daily 5)  Cetirizine Hcl 10 Mg Tabs (Cetirizine hcl) .Marland Kitchen.. 1 tab by mouth daily for allergies 6)  Diovan Hct 160-12.5 Mg Tabs (Valsartan-hydrochlorothiazide) .... Take 1 pill by mouth once daily 7)  Premarin 0.3 Mg Tabs (Estrogens conjugated) .... Take 1 pill by mouth once daily 8)  Carafate 1 Gm/64ml Susp (Sucralfate) .... Take 1 gram 4 times daily, between meals and at bedtime. 9)  Ultram 50 Mg Tabs (Tramadol hcl) .... Take 1 tab every 8 hours as needed for pain 10)  Klor-con 20 Meq Pack (Potassium chloride) .... Take 1 tab twice daily x 5 days 11)  Ventolin Hfa 108 (90 Base) Mcg/act Aers (Albuterol sulfate) .... 2 puffs inhaled every 4 hours as needed for cough, wheeze, shortness of breath  Other Orders: A1C-[facility] (11914) No Charge Patient Arrived (NCPA0) (NCPA0)  Laboratory Results   Blood Tests   Date/Time Received:  November 06, 2009 10:50 AM  Date/Time Reported: November 06, 2009 11:19 AM   HGBA1C: 6.1%   (Normal Range: Non-Diabetic - 3-6%   Control Diabetic - 6-8%)  Comments: ...............test performed by......Marland KitchenBonnie A. [place], MLS (ASCP)cm

## 2010-04-13 NOTE — Consult Note (Signed)
Summary: [facility]  [facility]   Imported By: Haydee Salter 03/01/2007 16:26:01  _____________________________________________________________________  External Attachment:    Type:   Image     Comment:   External Document

## 2010-04-13 NOTE — Assessment & Plan Note (Signed)
Summary: stomach pain wp   Vital Signs:  Patient Profile:   45 Years Old Female Height:     62 inches (157.48 cm) Weight:      201 pounds (91.36 kg) BMI:     36.90 O2 Sat:      99 % O2 treatment:    Room Air Temp:     97.9 degrees F (36.61 degrees C) Pulse rate:   88 / minute BP sitting:   138 / 88  (right arm)  Pt. in pain?   yes    Location:   upper abdomen and back    Intensity:   9    Type:       sharp  Vitals Entered By: Jacki Cones RN (April 17, 2008 1:40 PM)                  Referred by:  Romero Belling PCP:  Romero Belling MD  Chief Complaint:  stomach pain - from ulcer per pt.  History of Present Illness: Pt is a 45 yo female with DM and hx of adenocarcinoma of the lung who just underwent major lung surgery to excise her carcinoma. She is here today complaining of abdominal pain.   Says she feels constipated. Stomach pain x 1 week, since she has been out of the hospital. Feels irritable. Stabbing pain. Has had an "infection" inside of stomach before but sounds like this was H pylori. Has had stomach pain since 45 years old and it has been due to ulcers. Takes Protonix 40 mg two times a day currently . Helps stomach pain a little. Takes the pressure off. Having a lot of gas. Has hydrocodone - last took on Tuesday and now she does not have any more. No blood in stool. Some dark brown stools but not black. No vomiting. Feeling nausea x 1 week. Eating makes he nausous.  Was given stool softeners in hospital but has not taken any since then. Felt better after moved bowels when in hospital. Last BM was 2 days ago and was very small. Had one normal one on Monday when got out of the hospital. Eating a lot of fruit. Has had H pylori infection before in the past and feels the same now. Has had chills since leaving hospital. Last CBC 1/25 showed WBC 18 ish that was decreasing.   Lung surgery - Dr. Edwyna Shell saw her 2 days ago. Advised not to lift or do much exercise. Surgery was for  cancer. Had CXR 2 days ago and breathing is the same now. Admitted on 1/11 and left a 1.5 weeks ago (last Monday).   Only recent medication change was that Edwyna Shell stopped her lisinopril.     Current Allergies: ! CODEINE ! PENICILLIN      Physical Exam  General:     Seems very uncomfortable, sitting on exam table. Lungs:     CTA bilaterally  but taking small breaths secondary to pain from surgery Heart:     normal rate, regular rhythm, no murmur, no gallop, and no rub.   Abdomen:     Very tender in epigastric area and LUQ. Mild to moderate tenderness in RLQ. Normal Bowel sounds. No rebound or guarding.   Midline scar under umbilicus - vertical from c/s and hysterectomy  Rectal:     Some hard stool in vault. Heme negative.  Skin:     Left back with 3 prior chest tube incision sites all closed and not draining. Large horizontal scar -  appears healed. Very tender left lower back area.     Impression & Recommendations:  Problem # 1:  ABDOMINAL PAIN, UNSPECIFIED SITE (ICD-789.00) Assessment: New Unclear etiology but worrisome given recent surgery. Hopefully this is due to constipation, however must consider abdominal infection.  Patient does not want to be hospitalized. Will check stat CBC and CMET. H pylori is negative today. CBC came back with WBC around 10 which is much improved from 18 (it was this when left hospital). CMET was unremarkable. Will not get CT scan at this time. Will try to treat for constipation with Sennokot, Colace, and miralax as this appears to be the etiology of pain. If pain not improving she will likely need a CT scan.   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 Pulse ox is in upper 90s on room air and when she walks. Seems to be doing ok from a pulm standpoint. Just saw Edwyna Shell 2  days ago and had CXR at that time. Given refill on hydrocodone as she was getting this from her surgeon.  Orders: [facility]- Est  Level 4 (99214)   Problem # 3:  CONSTIPATION (ICD-564.00) Assessment: New Treat with below meds aggresively. Likely more constipated due to pain.   Her updated medication list for this problem includes:    Docusate Sodium 100 Mg Caps (Docusate sodium) .Marland Kitchen... 1 tab by mouth daily to prevent constipation while taking morphine    Miralax Powd (Polyethylene glycol 3350) .Marland KitchenMarland KitchenMarland KitchenMarland Kitchen 17 gm by mouth daily, dispense 2 weeks supply    Senokot S 8.6-50 Mg Tabs (Sennosides-docusate sodium) .Marland Kitchen... Take 2 tablets daily for constipation  Orders: [facility]- Est  Level 4 (57846)   Complete Medication List: 1)  Advair Diskus 500-50 Mcg/dose Misc (Fluticasone-salmeterol) .... Inhale 1 puff as directed twice a day 2)  Ventolin Hfa 108 (90 Base) Mcg/act Aers (Albuterol sulfate) .... Inhale 2 puffs q4h as directed 3)  Metoprolol Tartrate 100 Mg Tabs (Metoprolol tartrate) .... 2 tabs by mouth bid 4)  Caduet 10-10 Mg Tabs (Amlodipine-atorvastatin) .Marland Kitchen.. 1 tablet a day for blood pressure and cholesterol 5)  Fluocinonide 0.05 % Crea (Fluocinonide) .... Appy to affected area twice a day. disp 60 gram tube 6)  Metformin Hcl 500 Mg Tabs (Metformin hcl) .... Take 1 tablet by mouth two times a day 7)  Medtronic Glucometer, Strips, and Lancets  .... Needing 1 glucometer, strips #60, lancets #60 disp qs 8)  Pantoprazole Sodium 40 Mg Tbec (Pantoprazole sodium) .Marland Kitchen.. 1 tab by mouth daily 9)  Docusate Sodium 100 Mg Caps (Docusate sodium) .Marland Kitchen.. 1 tab by mouth daily to prevent constipation while taking morphine 10)  Hydrocodone-acetaminophen 5-325 Mg Tabs (Hydrocodone-acetaminophen) .... Take 1-2 tablets every 4-6 hours as needed pain. 11)  Miralax Powd (Polyethylene glycol 3350) .Marland KitchenMarland Kitchen. 17 gm by mouth daily, dispense 2 weeks supply 12)  Senokot S 8.6-50 Mg Tabs (Sennosides-docusate sodium) .... Take 2 tablets daily for  constipation  Other Orders: A1C-[facility] (96295)   Patient Instructions: 1)  Take Colace 100 mg two times a day for constipation. 2)  Also take Miralax 17 mg daily for constipation.  3)  Also take Sennakot S 2 tablets daily.  4)  We will call you when your labs come back. 5)  If the labs that indicate you may have an infection are increasing, you will need to go to the  Emergency Room for a CT scan of your abdomen.  6)  If the pain does not get better after taking the medications for constipation, you will need to come back to see [place] and possibly go to the hospital.    Prescriptions: SENOKOT S 8.6-50 MG TABS (SENNOSIDES-DOCUSATE SODIUM) Take 2 tablets daily for constipation  #60 x 0   Entered and Authorized by:   Cyndia Bent MD   Signed by:   Cyndia Bent MD on 04/17/2008   Method used:   Electronically to        [facility] [address] (retail)       [address] [place], [place]  [address]       Ph: [phone]       Fax: [phone]   RxID:   [id number] MIRALAX  POWD (POLYETHYLENE GLYCOL 3350) 17 gm by mouth daily, Dispense 2 weeks supply  #1 x 1   Entered and Authorized by:   Cyndia Bent MD   Signed by:   Cyndia Bent MD on 04/17/2008   Method used:   Electronically to        [facility] [address] (retail)       [address]       [place], [place]  [address]       Ph: [phone]       Fax: [phone]   RxID:   [id number] DOCUSATE SODIUM 100 MG CAPS (DOCUSATE SODIUM) 1 tab by mouth daily to prevent constipation while taking morphine  #30 x 2   Entered and Authorized by:   Cyndia Bent MD   Signed by:   Cyndia Bent MD on 04/17/2008   Method used:   Electronically to        [facility] [address] (retail)       [address] [place], [place]  [address]       Ph: [phone]       Fax: [phone]   RxID:   [id number] HYDROCODONE-ACETAMINOPHEN 5-325  MG TABS (HYDROCODONE-ACETAMINOPHEN) Take 1-2 tablets every 4-6 hours as needed pain.  #30 x 0   Entered and Authorized by:   Cyndia Bent MD   Signed by:   Cyndia Bent MD on 04/17/2008   Method used:   Print then Give to Patient   RxID:   [id number]   Laboratory Results   Blood Tests   Date/Time Received: April 17, 2008 2:38 PM  Date/Time Reported: April 17, 2008 3:38 PM   HGBA1C: 6.6%   (Normal Range: Non-Diabetic - 3-6%   Control Diabetic - 6-8%)  H. pylori: negative Comments: ..........test performed by.....Marland KitchenMarland KitchenDeseree Blount (SMA)                 confirmed by...Marland KitchenMarland KitchenMarland Kitchen Bonnie A. [place], [place] (ASCP)     Appended Document: stomach pain wp Discussed in detail with Dr. McDiarmid who also examined patient.

## 2010-04-13 NOTE — Consult Note (Signed)
Summary: [facility] HEM/ONC  [facility]   Imported By: De Nurse 11/24/2009 16:29:22  _____________________________________________________________________  External Attachment:    Type:   Image     Comment:   External Document

## 2010-04-13 NOTE — Progress Notes (Signed)
Summary: results  Phone Note Call from Patient Call back at Home Phone [phone]   Caller: Patient Call For: Airam Runions Reason for Call: Talk to Nurse, Lab or Test Results Summary of Call: said she had biopsy Thursday, calling for results from [facility]. Initial call taken by: Eugene Gavia,  February 25, 2008 2:25 PM  Follow-up for Phone Call        Pt had needle bx on 02/21/08.  Pt calling for results. Please advise. Thanks Follow-up by: Cloyde Reams RN,  February 25, 2008 2:31 PM  Additional Follow-up for Phone Call Additional follow up Details #1::        Per [facility]: pt needs ov to discuss results. Ok to [facility].  Cyndia Diver LPN  February 25, 2008 4:24 PM  lmtcb  Philipp Deputy [facility]  February 25, 2008 4:27 PM   Called spoke with pt advised per [facility] needs OV to discuss results. Made OV with [facility] for 02/28/08 at 1:30pm. Additional Follow-up by: Cloyde Reams RN,  February 27, 2008 11:21 AM

## 2010-04-13 NOTE — Progress Notes (Signed)
Summary: Rx Prob  Phone Note Call from Patient Call back at Home Phone [phone]   Caller: Patient Summary of Call: pt wondering why her medication was denied and if she should still take her bp meds.  Her pharmacy is [facility]. Initial call taken by: Clydell Hakim,  October 20, 2008 4:40 PM  Follow-up for Phone Call        the one she requested was changed on 02/20/08 by Dr. Constance Goltz and removed from her med list. If she thinks she is supposed to be on it, she will need to schedule appt and address with Dr. Constance Goltz or he may address when he returns.  Follow-up by: Lequita Asal  MD,  October 20, 2008 11:52 PM  Additional Follow-up for Phone Call Additional follow up Details #1::        Left message on voicemail informing of above. Additional Follow-up by: Garen Grams LPN,  October 21, 2008 9:30 AM

## 2010-04-13 NOTE — Progress Notes (Signed)
Summary: triage  Phone Note Call from Patient Call back at Home Phone [phone]   Caller: Patient Summary of Call: pt having a hard time breathing. Initial call taken by: Clydell Hakim,  October 02, 2008 11:14 AM  Follow-up for Phone Call        states ever since her surgery she breathes "like hiccups" wants to be checked to make sure she will not have any problems being on an airplane tomorrow to [place]. work in at [employer] Follow-up by: Golden Circle RN,  October 02, 2008 11:15 AM

## 2010-04-13 NOTE — Progress Notes (Signed)
Summary: triage  Phone Note Call from Patient Call back at Home Phone [phone]   Caller: Patient Summary of Call: pt wants to be seen today for hot flashes. Initial call taken by: Clydell Hakim,  October 21, 2008 10:17 AM  Follow-up for Phone Call        Wants to be seen today.  Says hot hot flashes are occuring 2 to 3 times a day resulting in fatigue and "feeling drained".  Even had to leave work after an episode this am.  Told her to come on in and someone would see her this am. Follow-up by: Dennison Nancy RN,  October 21, 2008 10:38 AM

## 2010-04-13 NOTE — Consult Note (Signed)
Summary: [facility]  [facility]   Imported By: Marily Memos 05/27/2009 11:20:31  _____________________________________________________________________  External Attachment:    Type:   Image     Comment:   External Document  Appended Document: [facility]    Clinical Lists Changes  Problems: Added new problem of POLYCYTHEMIA (ICD-289.0) Assessed POLYCYTHEMIA as new - Noted by hem-onc.  Phlebotomized 1 unit.  Needs repeat CBC.        Impression & Recommendations:  Problem # 1:  POLYCYTHEMIA (ICD-289.0) Assessment New Noted by hem-onc.  Phlebotomized 1 unit.  Needs repeat CBC.  Complete Medication List: 1)  Metoprolol Tartrate 100 Mg Tabs (Metoprolol tartrate) .... 2 tabs by mouth two times a day 2)  Caduet 10-10 Mg Tabs (Amlodipine-atorvastatin) .Marland Kitchen.. 1 tablet a day for blood pressure and cholesterol 3)  Fluocinonide 0.05 % Crea (Fluocinonide) .... Appy to affected area twice a day. disp 60 gram tube. 4)  Sertraline Hcl 100 Mg Tabs (Sertraline hcl) .Marland Kitchen.. 1 tab by mouth daily 5)  Cetirizine Hcl 10 Mg Tabs (Cetirizine hcl) .Marland Kitchen.. 1 tab by mouth daily for allergies 6)  Diovan Hct 160-12.5 Mg Tabs (Valsartan-hydrochlorothiazide) .... Take 1 pill by mouth once daily 7)  Premarin 0.3 Mg Tabs (Estrogens conjugated) .... Take 1 pill by mouth once daily 8)  Carafate 1 Gm/90ml Susp (Sucralfate) .... Take 1 gram 4 times daily, between meals and at bedtime. 9)  Ultram 50 Mg Tabs (Tramadol hcl) .... Take 1 tab every 8 hours as needed for pain 10)  Klor-con 20 Meq Pack (Potassium chloride) .... Take 1 tab twice daily x 5 days

## 2010-04-13 NOTE — Progress Notes (Signed)
Summary: Diovan Denied       Additional Follow-up for Phone Call Additional follow up Details #2::    diovan denied by Medicaid. forms in md chart box.Kennon Rounds CALDWELL RN  February 14, 2008 11:47 AM  Follow-up by: Golden Circle RN,  February 14, 2008 11:47 AM  Will re-send form with new information.  Romero Belling MD  February 14, 2008 12:19 PM

## 2010-04-13 NOTE — Assessment & Plan Note (Signed)
Summary: ulcer pain & infected scratch   Vital Signs:  Patient Profile:   45 Years Old Female Height:     62 inches (157.48 cm) Weight:      198.9 pounds BMI:     36.51 Temp:     98.8 degrees F oral Pulse rate:   75 / minute BP sitting:   114 / 77  (left arm) Cuff size:   large  Vitals Entered By: Garen Grams LPN (May 15, 5407 10:22 AM)                 Referred by:  Romero Belling PCP:  Romero Belling MD  Chief Complaint:  scratch on left breast and stomach problems.  History of Present Illness: EPIGASTRIC PAIN.  History of peptic ulcer disease, s/p H. pylori eradication.  Pain is daily pressure, burning, worse with eating especially spicy and fatty foods.  LUMPS ON BREAST.  LEFT nipple tender bumps on areola x1 month.  Subjective fevers, no discharge, no nipple discharge.  INSOMNIA.  Sleep onset insomnia till 1-3 am every night.  Has tried multiple OTC preparations without relief.  Mood = scared.  Formerly on Zoloft and did not have these problems then.  Denies SI.    Updated Prior Medication List: VENTOLIN HFA 108 (90 BASE) MCG/ACT AERS (ALBUTEROL SULFATE) Inhale 2 puffs q4h as directed METOPROLOL TARTRATE 100 MG  TABS (METOPROLOL TARTRATE) 2 tabs by mouth two times a day CADUET 10-10 MG TABS (AMLODIPINE-ATORVASTATIN) 1 tablet a day for blood pressure and cholesterol FLUOCINONIDE 0.05 %  CREA (FLUOCINONIDE) appy to affected area twice a day. disp 60 gram tube. PANTOPRAZOLE SODIUM 40 MG TBEC (PANTOPRAZOLE SODIUM) 1 tab by mouth two times a day MIRALAX  POWD (POLYETHYLENE GLYCOL 3350) 17 gm by mouth daily. Disp 1 month supply. SERTRALINE HCL 100 MG TABS (SERTRALINE HCL) 1/2 tab by mouth daily x2 weeks, then 1 tab by mouth daily CLONAZEPAM 0.5 MG TABS (CLONAZEPAM) 1 tab by mouth two times a day x3 weeks SMZ-TMP DS 800-160 MG TABS (SULFAMETHOXAZOLE-TRIMETHOPRIM) 2 tabs by mouth two times a day x14 days  Current Allergies (reviewed today): ! CODEINE !  PENICILLIN      Physical Exam  General:     Well-developed,well-nourished,in no acute distress; alert,appropriate and cooperative throughout examination Head:     Normocephalic and atraumatic without obvious abnormalities. No apparent alopecia or balding. Breasts:     Multiple 2-3 mm tender papules on LEFT areola without drainage.  No nipple dischrage. Lungs:     Normal respiratory effort, chest expands symmetrically. Lungs are clear to auscultation, no crackles or wheezes. Heart:     Normal rate and regular rhythm. S1 and S2 normal without gallop, murmur, click, rub or other extra sounds. Abdomen:     Bowel sounds positive,abdomen soft and non-tender without masses, organomegaly or hernias noted. Extremities:     No clubbing, cyanosis, edema, or deformity noted with normal full range of motion of all joints.      Impression & Recommendations:  Problem # 1:  CELLULITIS (ICD-682.9) Assessment: New Trial of ABX.  If not improved in 2 weeks, consider malignancy. Her updated medication list for this problem includes:    Smz-tmp Ds 800-160 Mg Tabs (Sulfamethoxazole-trimethoprim) .Marland Kitchen... 2 tabs by mouth two times a day x14 days  Orders: [facility]- Est  Level 4 (81191)   Problem # 2:  ABDOMINAL PAIN, UNSPECIFIED SITE (ICD-789.00) Assessment: Unchanged Check LFTs and Lipase.  U/S to r/o cholecystitis. Orders: Comp Met-[facility] (  [phone]) Lipase-[facility] ([id number]) [facility]- Est  Level 4 (29562) Ultrasound (Ultrasound)   Problem # 3:  DEPRESSION, MAJOR, RECURRENT (ICD-296.30) Assessment: Deteriorated Restart Zoloft.  Bridge with Clonazepam x3 weeks.  RTC 2 weeks to evaluate mood. Orders: [facility]- Est  Level 4 (99214)   Complete Medication List: 1)  Ventolin Hfa 108 (90 Base) Mcg/act Aers (Albuterol sulfate) .... Inhale 2 puffs q4h as directed 2)  Metoprolol Tartrate 100 Mg Tabs (Metoprolol tartrate) .... 2 tabs by mouth two times a day 3)  Caduet 10-10 Mg Tabs (Amlodipine-atorvastatin)  .Marland Kitchen.. 1 tablet a day for blood pressure and cholesterol 4)  Fluocinonide 0.05 % Crea (Fluocinonide) .... Appy to affected area twice a day. disp 60 gram tube. 5)  Pantoprazole Sodium 40 Mg Tbec (Pantoprazole sodium) .Marland Kitchen.. 1 tab by mouth two times a day 6)  Miralax Powd (Polyethylene glycol 3350) .Marland KitchenMarland Kitchen. 17 gm by mouth daily. disp 1 month supply. 7)  Sertraline Hcl 100 Mg Tabs (Sertraline hcl) .... 1/2 tab by mouth daily x2 weeks, then 1 tab by mouth daily 8)  Clonazepam 0.5 Mg Tabs (Clonazepam) .Marland Kitchen.. 1 tab by mouth two times a day x3 weeks 9)  Smz-tmp Ds 800-160 Mg Tabs (Sulfamethoxazole-trimethoprim) .... 2 tabs by mouth two times a day x14 days   Patient Instructions: 1)  Breast infection:  I have prescribed an antiobiotic for you to take two times a day for 2 weeks. 2)  Insomnia/Mood:  I have prescribed Sertraline (Zoloft) for you to take daily as prescribed (start with half tab x2 weeks).  I have also prescribed Clonazepam to be taken two times a day x3 weeks. 3)  Abdominal Pain:  Take pantoprazole two times a day x2 weeks to see if this improves.  As always, avoid caffeine, soda, spicy greasy foods. 4)  I have ordered an ultrasound of your abdomen for the belly pain as well. 5)  Please schedule a follow-up appointment in 2 weeks to evaluate:  breast infection, abdominal pain, mood.   Prescriptions: SMZ-TMP DS 800-160 MG TABS (SULFAMETHOXAZOLE-TRIMETHOPRIM) 2 tabs by mouth two times a day x14 days  #56 x 0   Entered and Authorized by:   Romero Belling MD   Signed by:   Romero Belling MD on 05/14/2008   Method used:   Electronically to        [facility] [address] (retail)       [address]       [place], [place]  [address]       Ph: [phone]       Fax: [phone]   RxID:   [id number] CLONAZEPAM 0.5 MG TABS (CLONAZEPAM) 1 tab by mouth two times a day x3 weeks  #42 x 0   Entered and Authorized by:   Romero Belling MD   Signed by:   Romero Belling MD on  05/14/2008   Method used:   Print then Give to Patient   RxID:   [id number] VENTOLIN HFA 108 (90 BASE) MCG/ACT AERS (ALBUTEROL SULFATE) Inhale 2 puffs q4h as directed  #1 x 3   Entered and Authorized by:   Romero Belling MD   Signed by:   Romero Belling MD on 05/14/2008   Method used:   Electronically to        [facility] [address] (retail)       [address]       [place], [place]  [address]       Ph: [phone]       Fax: [phone]   RxID:   [id number] METOPROLOL TARTRATE 100 MG  TABS (METOPROLOL TARTRATE) 2 tabs by mouth two times a day  #120 x 3   Entered and Authorized by:   Romero Belling MD   Signed by:   Romero Belling MD on 05/14/2008   Method used:   Electronically to        [facility] [address] (retail)       [address] [place], [place]  [address]       Ph: [phone]       Fax: [phone]   RxID:   [id number] CADUET 10-10 MG TABS (AMLODIPINE-ATORVASTATIN) 1 tablet a day for blood pressure and cholesterol  #34 x 3   Entered and Authorized by:   Romero Belling MD   Signed by:   Romero Belling MD on 05/14/2008   Method used:   Electronically to        [facility] [address] (retail)       [address] [place], [place]  [address]       Ph: [phone]       Fax: [phone]   RxID:   [id number] FLUOCINONIDE 0.05 %  CREA (FLUOCINONIDE) appy to affected area twice a day. disp 60 gram tube.  #1 x 0   Entered and Authorized by:   Romero Belling MD   Signed by:   Romero Belling MD on 05/14/2008   Method used:   Electronically to        [facility] [address] (retail)       [address] [place], [place]  [address]       Ph: [phone]       Fax: [phone]   RxID:   [id number] MIRALAX  POWD (POLYETHYLENE GLYCOL 3350) 17 gm by mouth daily. Disp 1 month supply.  #1 x 3   Entered and Authorized by:   Romero Belling MD    Signed by:   Romero Belling MD on 05/14/2008   Method used:   Electronically to        [facility] [address] (retail)       [address] [place], [place]  [address]       Ph: [phone]       Fax: [phone]   RxID:   [id number] PANTOPRAZOLE SODIUM 40 MG TBEC (PANTOPRAZOLE SODIUM) 1 tab by mouth two times a day  #60 x 3   Entered and Authorized by:   Romero Belling MD   Signed by:   Romero Belling MD on 05/14/2008   Method used:   Electronically to        [facility] [address] (retail)       [address] [place], [place]  [address]       Ph: [phone]       Fax: [phone]   RxID:   [id number] SERTRALINE HCL 100 MG TABS (SERTRALINE  HCL) 1/2 tab by mouth daily x2 weeks, then 1 tab by mouth daily  #30 x 3   Entered and Authorized by:   Romero Belling MD   Signed by:   Romero Belling MD on 05/14/2008   Method used:   Electronically to        [facility] [address] (retail)       [address] [place], [place]  [address]       Ph: [phone]       Fax: [phone]   RxID:   [id number]

## 2010-04-13 NOTE — Miscellaneous (Signed)
Summary: HEAD CT APPT  HEAD CT WITHOUT CONTRAST: 10.1.08 @ 1PM [facility], 301 LOCATION MESSAGE LEFT FOR PT

## 2010-04-13 NOTE — Miscellaneous (Signed)
Summary: Directives and Consents  Directives and Consents   Imported By: Denny Peon LEVAN 04/06/2007 12:13:08  _____________________________________________________________________  External Attachment:    Type:   Image     Comment:   External Document

## 2010-04-13 NOTE — Letter (Signed)
Summary: Out of Work  [facility]  [address]   [place], [place] [address]   Phone: [phone]  Fax: [phone]    August 14, 2007   Employee:  Theresa [name]    To Whom It May Concern:   For Medical reasons, please excuse the above named employee from work for the following dates:  Start:   August 14, 2007    End:   August 15, 2007  If you need additional information, please feel free to contact our office.         Sincerely,    Tyrone Pautsch CMA,

## 2010-04-13 NOTE — Letter (Signed)
Summary: U/A wnl  [facility]  [address]   [place], [place] [address]   Phone: [phone]  Fax: [phone]    06/24/2008  [facility] [employer] [address]  [address] [place], [place]  [address]  Dear [name],  Your urine tests were normal today.  Sincerely,   Romero Belling MD [facility]  Appended Document: U/A wnl Mailed

## 2010-04-13 NOTE — Progress Notes (Signed)
Summary: results  Phone Note Call from Patient Call back at Home Phone [phone]   Caller: Patient Summary of Call: pt is wanting the results of [place] Initial call taken by: De Nurse,  May 22, 2008 12:16 PM  Follow-up for Phone Call        will send message to Dr. Lanier Prude in Dr. Lonell Face absence.. Follow-up by: Theresia Lo RN,  May 22, 2008 12:23 PM  Additional Follow-up for Phone Call Additional follow up Details #1::        U/S did not show gallstones which it appears was what Dr. Constance Goltz was concerned about. patient informed. Additional Follow-up by: Lequita Asal  MD,  May 22, 2008 2:44 PM

## 2010-04-13 NOTE — Progress Notes (Signed)
Summary: Triage  Phone Note Call from Patient Call back at Home Phone [phone]   Caller: Daughter Reason for Call: Talk to Nurse Summary of Call: Pts daughter states that pt hasn't had a BM in a while and is complaining of abdominal pain. Initial call taken by: Haydee Salter,  May 21, 2007 11:58 AM  Follow-up for Phone Call        left message Follow-up by: Golden Circle RN,  May 21, 2007 12:00 PM  Additional Follow-up for Phone Call Additional follow up Details #1::        left message Additional Follow-up by: Golden Circle RN,  May 21, 2007 2:00 PM    Additional Follow-up for Phone Call Additional follow up Details #2::    spoke with pt. has pressure on r side. last bm was 2 days ago.  uses ducolax. wants to see md. appt made Follow-up by: Golden Circle RN,  May 21, 2007 4:49 PM

## 2010-04-13 NOTE — Assessment & Plan Note (Signed)
Summary: possible uti/ACM   Vital Signs:  Patient Profile:   45 Years Old Female Weight:      220.7 pounds Temp:     98.9 degrees F BP sitting:   130 / 85  Vitals Entered By: Jone Baseman CMA (January 26, 2007 11:07 AM)                 Visit Type:  same day appointment PCP:  Wilhemina Bonito  MD  Chief Complaint:  possible UTI.  History of Present Illness: Theresa [name] is a 45 year old pt new to me who presents with 2 day history of cloudy urine, polydypsia, nausea, and polyuria.  She denies dysuria, hematuria, back pain, or fevers.  She also denies any vaginal discharge or vaginal itching/buring.  She has had a hysterectomy so is not concerned about pregnancy.    Past Medical History:    Reviewed history from 12/11/2006 and no changes required:       [id number],        retinitis pigmetosa,        Sinusitis       depression       obesity       low back pain       disk herniation       migraines  Past Surgical History:    Reviewed history from 05/11/2006 and no changes required:       BTL - 09/06/2000, c-s x3 - 08/25/2000, eye surgeries - 08/25/2000, Hysterectomy - Partial - 09/11/2001, Spirometry - mild - mod obs dz - 10/12/2004   Family History:    Reviewed history from 12/11/2006 and no changes required:       F- died unknown causes, GF-CAD, GM-DM, htn, M-htn, DM, mom with early menopause, no h/o breat/colon ca, u x2-CAD       daughter , sister, and cousins with migraines.  mother sometimes has a migraine       Moterh with MIs x 2       CVAs in maternal aunt.  .    Social History:    Reviewed history from 05/11/2006 and no changes required:       Smoker-20 pack year hx (3-4 ppd); occas EtOH; married with 4 kids    Review of Systems      See HPI  General      Denies fever.  GU      Complains of urinary frequency.      Denies discharge, dysuria, genital sores, hematuria, incontinence, nocturia, and urinary hesitancy.   Physical Exam  General:  Well-developed,well-nourished,in no acute distress; alert,appropriate and cooperative throughout examination Abdomen:     Bowel sounds positive,abdomen soft and non-tender without masses, organomegaly or hernias noted. Msk:     No flank tenderness.    Impression & Recommendations:  Problem # 1:  POLYURIA (ZOX-096.04) New problem- secondary to polydypsia vs. diuretic use.  UA neg for UTI.  Will check CBG as she is fasting today to rule out DM.  No vaginal sx and declined pelvic and wet prep.   Orders: Glucose-[facility] ([address]98-[address]14) [facility]- Est Level  3 ([address])   Complete Medication List: 1)  Advair Diskus 500-50 Mcg/dose Misc (Fluticasone-salmeterol) .... Inhale 1 puff as directed twice a day 2)  Albuterol 90 Mcg/act Aers (Albuterol) .... Inhale 2 puff as directed every four hours 3)  Diovan Hct 160-12.5 Mg Tabs (Valsartan-hydrochlorothiazide) .... Take 1 tablet by mouth once a day 4)  Metoprolol Succinate 200 Mg Tb24 (Metoprolol succinate) .Marland KitchenMarland KitchenMarland Kitchen  Take 1 tablet by mouth once a day 5)  Protonix 40 Mg Tbec (Pantoprazole sodium) .... Take 1 tablet by mouth once a day 6)  Sertraline Hcl 100 Mg Tabs (Sertraline hcl) .... Take 1 1/2 tablet by mouth once a day 7)  Spiriva Handihaler 18 Mcg Caps (Tiotropium bromide monohydrate) .... Inhale 1 capsule as directed once a day 8)  Zyrtec Allergy 10 Mg Tabs (Cetirizine hcl) .... Take 1 tablet by mouth once a day 9)  Caduet 10-10 Mg Tabs (Amlodipine-atorvastatin) .Marland Kitchen.. 1 tablet a day for blood pressure and cholesterol 10)  Metrogel 1 % Gel (Metronidazole) .Marland Kitchen.. 1 applicator full into vagina x 5 days, use 1 time daily. 11)  Midrin 325-65-100 Mg Caps (Apap-isometheptene-dichloral) .... Take 2 tab at onset of headache, may repeat with 1 tablet every 1 up to 5 tablet in 12 hrs. 12)  Fluocinonide-e 0.05 % Crea (Fluocinonide emulsified base) .... Apply small amt twice daily as needed.  disp 60gram cream. 13)  Flexeril 10 Mg Tabs (Cyclobenzaprine hcl) .Marland Kitchen.. 1 tablet by  mouth at bedtime  Other Orders: Urinalysis-[facility] (00000) Diabetic Clinic Referral (Diabetic)     ] Laboratory Results   Urine Tests  Date/Time Recieved: January 26, 2007 11:12 AM  Routine Urinalysis   Color: yellow Appearance: clear Glucose: negative   (Normal Range: Negative) Bilirubin: negative   (Normal Range: Negative) Ketone: negative   (Normal Range: Negative) Spec. Gravity: 1.020   (Normal Range: 1.003-1.035) Blood: negative   (Normal Range: Negative) pH: 6.5   (Normal Range: 5.0-8.0) Protein: 30   (Normal Range: Negative) Urobilinogen: 1.0   (Normal Range: 0-1) Nitrite: negative   (Normal Range: Negative) Leukocyte Esterace: negative   (Normal Range: Negative)       Appended Document: possible uti/ACM CBG 147.  Will refer to Diabetic teaching clinic and make appt with  Dr. Sandria Manly.  Appended Document: urine micro    Clinical Lists Changes  Observations: Added new observation of COMMENTS: ...................................................................Altamese Dilling CMA,  January 26, 2007 11:50 AM  (01/26/2007 11:48) Added new observation of EPI CELL UR: 5-10 (01/26/2007 11:48) Added new observation of BACTERIA URN: 1+ (01/26/2007 11:48) Added new observation of RBCS MICRO U: none (01/26/2007 11:48) Added new observation of WBCS MICRO U: 02 cells/hpf (01/26/2007 11:48)       Laboratory Results   Urine Tests  Date/Time Recieved: January 26, 2007 11:49 AM Date/Time Reported: January 26, 2007 11:49 AM  Urine Microscopic WBC/hpf: 02 RBC/hpf: none Bacteria: 1+ Epithelial: 5-10    Comments: ...................................................................Altamese Dilling CMA,  January 26, 2007 11:50 AM    Appended Document: glucose results    Lab Visit   Laboratory Results   Blood Tests   Date/Time Received: January 26, 2007 11:41 AM  Date/Time Reported: January 26, 2007 11:52 AM   CBG Fasting:: 147   Comments: ...................................................................DONNA [facility]  January 26, 2007 11:52 AM     Orders Today: Glucose Cap-[facility] [47829]

## 2010-04-13 NOTE — Letter (Signed)
Summary: Diabetes Letter  [facility]  [address]   [place], [place] [address]   Phone: [phone]  Fax: [phone]    01/02/2008  CARTINA BROUSSEAU [address] [place], [place]  [address]  Dear [name],  My records indicate that you are several months past due on a followup for your diabetes.  As you know, diabetes is a chronic disease that requires regular follow-up with your physician.  Please call my office to schedule an appointment in the next 2 weeks.  I look forward to continuing to work with you.  Sincerely,   Romero Belling MD [facility]  Appended Document: Diabetes Letter mailed

## 2010-04-13 NOTE — Miscellaneous (Signed)
Summary: Pantoprazole change to Omeprazole  Pantoprazole Requires Prior Authorization for Medicaid.  Patient with history of peptic ulcer disease.  Will switch from Pantoprazole 40 mg by mouth two times a day to Omeprazole 40 mg by mouth two times a day.

## 2010-04-13 NOTE — Letter (Signed)
Summary: Out of Work  [facility]  [address]   [place], [place] [address]   Phone: [phone]  Fax: [phone]    April 08, 2009   Employee:  ARITHA HUCKEBA [employer]    To Whom It May Concern:   For Medical reasons, please excuse the above named employee from work for the following dates:  Start:  April 08, 2009   End:  April 08, 2009   If you need additional information, please feel free to contact our office.         Sincerely,    Romero Belling MD

## 2010-04-13 NOTE — Assessment & Plan Note (Signed)
Summary: DNKA            Complete Medication List: 1)  Advair Diskus 500-50 Mcg/dose Misc (Fluticasone-salmeterol) .... Inhale 1 puff as directed twice a day 2)  Albuterol 90 Mcg/act Aers (Albuterol) .... Inhale 2 puff as directed every four hours 3)  Diovan Hct 160-12.5 Mg Tabs (Valsartan-hydrochlorothiazide) .... Take 1 tablet by mouth once a day 4)  Metoprolol Succinate 200 Mg Tb24 (Metoprolol succinate) .... Take 1 tablet by mouth once a day 5)  Protonix 40 Mg Tbec (Pantoprazole sodium) .... Take 1 tablet by mouth once a day 6)  Sertraline Hcl 100 Mg Tabs (Sertraline hcl) .... Take 1 1/2 tablet by mouth once a day 7)  Spiriva Handihaler 18 Mcg Caps (Tiotropium bromide monohydrate) .... Inhale 1 capsule as directed once a day 8)  Zyrtec Allergy 10 Mg Tabs (Cetirizine hcl) .... Take 1 tablet by mouth once a day 9)  Caduet 10-10 Mg Tabs (Amlodipine-atorvastatin) .Marland Kitchen.. 1 tablet a day for blood pressure and cholesterol 10)  Metrogel 1 % Gel (Metronidazole) .Marland Kitchen.. 1 applicator full into vagina x 5 days, use 1 time daily. 11)  Midrin 325-65-100 Mg Caps (Apap-isometheptene-dichloral) .... Take 2 tab at onset of headache, may repeat with 1 tablet every 1 up to 5 tablet in 12 hrs. 12)  Fluocinonide 0.05 % Crea (Fluocinonide) .... Appy to affected area twice a day. disp 60 gram tube 13)  Flexeril 10 Mg Tabs (Cyclobenzaprine hcl) .Marland Kitchen.. 1 tablet by mouth at bedtime 14)  Metformin Hcl 500 Mg Tabs (Metformin hcl) .... Take 1 tablet in am for 1 week, if no gi problems, then increase to 1 tablet twice a day. 15)  Medtronic Glucometer, Strips, and Lancets  .... Needing 1 glucometer, strips #60, lancets #60 disp qs     ]

## 2010-04-13 NOTE — Letter (Signed)
Summary: [facility]  This office was unable to contact pt to schedule an appt, See MD box for hardcopy

## 2010-04-13 NOTE — Assessment & Plan Note (Signed)
Summary: routine visit/eo   Vital Signs:  Patient Profile:   45 Years Old Female Height:     62 inches Weight:      215.8 pounds Temp:     99.0 degrees F oral Pulse rate:   61 / minute BP sitting:   145 / 92  (left arm)  Pt. in pain?   yes    Location:    both legs    Intensity:   10    Type:       stabbing  Vitals Entered By: C.Mallory gtcc/sma                   PCP:  Wilhemina Bonito  MD  Chief Complaint:   pain in both legs x 2 weeks and .  History of Present Illness: leg pain-walking makes it worse and elevation makes it better , pt hada surgery on both knees before, has arthritis, can't just get up and walk, pt has been taking ibuprofen 800mg  and not working, feels pins and stabbing in legs.  limping with walking.  with walking feels that legs want to give out on her, swelling, with elevation of legs, swelling improves.  muscle spasms in calf muscles with walking.   + diabetes.  + smoker 1/2 ppd.   pt also brought up hot flashes and stomach ulcers and yeast infection, however we will discuss this at another visit.      Past Medical History:    [id number],     retinitis pigmetosa,     Sinusitis    depression    obesity    low back pain    disk herniation    migraines    DM TYPE II- 11/08- new dx      Past Surgical History:    BTL - 09/06/2000, c-s x3 - 08/25/2000, eye surgeries - 08/25/2000, Hysterectomy - Partial - 09/11/2001, Spirometry - mild - mod obs dz - 10/12/2004    knee- arthroscopy on R 2009, and repair of torn ligaments on L 2008- Dr. Christian Mate, Dr. Lajoyce Corners did L.      Family History:    F- died unknown causes, GF-CAD, GM-DM, htn, M-htn, DM, mom with early menopause, no h/o breat/colon ca, u x2-CAD    daughter , sister, and cousins with migraines.  mother sometimes has a migraine    Moterh with MIs x 2    CVAs in maternal aunt.  .      Social History:    Smoker-20 pack year hx (3-4 ppd); occas EtOH; married with 4 kids       Physical Exam  General:  Well-developed,well-nourished,in no acute distress; alert,appropriate and cooperative throughout examination Lungs:     Normal respiratory effort, chest expands symmetrically. Lungs are clear to auscultation, no crackles or wheezes. Heart:     Normal rate and regular rhythm. S1 and S2 normal without gallop, murmur, click, rub or other extra sounds. Extremities:     no noted edema, + 2 DP bilaterall, normal monofilament exam bilaterally, reflexes +2 bilaterally at knees and ankles, pain with palpation of calf muscles.   Neurologic:     alert & oriented X3 and cranial nerves II-XII intact.      Impression & Recommendations:  Problem # 1:  LEG PAIN, BILATERAL (ICD-729.5) Assessment: New pain with walking and crampy in nature, + known smoker, with DM type II and HTN.  Will schedule arterial and venous dopplers.  Pt taking full dose ASA.  pt to followup  in 2 weeks to review studies.   Orders: [facility]- Est  Level 4 ([address]) CK (Creatine Kinase)-[facility] [phone]) Comp Met-[facility] ([address]) LE Arterial Doppler/ABI (LE arterial doppler) LE Venous Duplex (DVT) (DVT)   Problem # 2:  TOBACCO DEPENDENCE (ICD-305.1) Assessment: Unchanged  Pt is willing to meet with Paulino Rily for smoking cessation.   Orders: [facility]- Est  Level 4 ([address])   Problem # 3:  ALLERGIC RHINITIS (ICD-477.9) Assessment: Unchanged refilled meds and steroid creams.   Her updated medication list for this problem includes:    Zyrtec Allergy 10 Mg Tabs (Cetirizine hcl) .Marland Kitchen... Take 1 tablet by mouth once a day  Orders: [facility]- Est  Level 4 ([address])   Problem # 4:  DIABETES-TYPE 2 (ICD-250.00) stable.   Her updated medication list for this problem includes:    Diovan Hct 160-12.5 Mg Tabs (Valsartan-hydrochlorothiazide) .Marland Kitchen... Take 1 tablet by mouth once a day    Metformin Hcl 500 Mg Tabs (Metformin hcl) .Marland Kitchen... Take 1 tablet in am for 1 week, if no gi problems, then increase to 1 tablet twice a day.   Complete Medication List: 1)   Advair Diskus 500-50 Mcg/dose Misc (Fluticasone-salmeterol) .... Inhale 1 puff as directed twice a day 2)  Albuterol 90 Mcg/act Aers (Albuterol) .... Inhale 2 puff as directed every four hours 3)  Diovan Hct 160-12.5 Mg Tabs (Valsartan-hydrochlorothiazide) .... Take 1 tablet by mouth once a day 4)  Metoprolol Succinate 200 Mg Tb24 (Metoprolol succinate) .... Take 1 tablet by mouth once a day 5)  Protonix 40 Mg Tbec (Pantoprazole sodium) .... Take 1 tablet by mouth once a day 6)  Sertraline Hcl 100 Mg Tabs (Sertraline hcl) .... Take 1 1/2 tablet by mouth once a day 7)  Spiriva Handihaler 18 Mcg Caps (Tiotropium bromide monohydrate) .... Inhale 1 capsule as directed once a day 8)  Zyrtec Allergy 10 Mg Tabs (Cetirizine hcl) .... Take 1 tablet by mouth once a day 9)  Caduet 10-10 Mg Tabs (Amlodipine-atorvastatin) .Marland Kitchen.. 1 tablet a day for blood pressure and cholesterol 10)  Metrogel 1 % Gel (Metronidazole) .Marland Kitchen.. 1 applicator full into vagina x 5 days, use 1 time daily. 11)  Midrin 325-65-100 Mg Caps (Apap-isometheptene-dichloral) .... Take 2 tab at onset of headache, may repeat with 1 tablet every 1 up to 5 tablet in 12 hrs. 12)  Fluocinonide 0.05 % Crea (Fluocinonide) .... Appy to affected area twice a day. disp 60 gram tube 13)  Flexeril 10 Mg Tabs (Cyclobenzaprine hcl) .Marland Kitchen.. 1 tablet by mouth at bedtime 14)  Metformin Hcl 500 Mg Tabs (Metformin hcl) .... Take 1 tablet in am for 1 week, if no gi problems, then increase to 1 tablet twice a day. 15)  Medtronic Glucometer, Strips, and Lancets  .... Needing 1 glucometer, strips #60, lancets #60 disp qs 16)  Fleet Mineral Oil Enem (Mineral oil) .... Use per rectum for relief of constipation.  may repeat in 8 hours if bowel movement does not result. 17)  Miralax Powd (Polyethylene glycol 3350) .Marland Kitchen.. 17g dissolved in 8oz of water daily for relief of constipation.  disp q.s. 30 days 18)  Benefiber Powd (Wheat dextrin) .... Use as directed to increase daily fiber  intake. 19)  Darvocet-n 100 100-650 Mg Tabs (Propoxyphene n-apap) .Marland Kitchen.. 1 tablet every 4 hours for pain, not to exceed 6 tablets in 1 day.   Patient Instructions: 1)  Please schedule a follow-up appointment in 2 weeks to discuss results.     Prescriptions: FLUOCINONIDE 0.05 %  CREA (FLUOCINONIDE) appy to affected area twice a day. disp 60 gram tube  #1 x 2   Entered and Authorized by:   Wilhemina Bonito  MD   Signed by:   Wilhemina Bonito  MD on 07/17/2007   Method used:   Electronically sent to ...       [facility] [id number]       [address]       [place], [place]  [address]       Ph: [phone]       Fax: [phone]   RxID:   [id number] ZYRTEC ALLERGY 10 MG TABS (CETIRIZINE HCL) Take 1 tablet by mouth once a day  #30 x 4   Entered and Authorized by:   Wilhemina Bonito  MD   Signed by:   Wilhemina Bonito  MD on 07/17/2007   Method used:   Electronically sent to ...       [facility] [address]       [address]       [place], [place]  [address]       Ph: [phone]       Fax: [phone]   RxID:   [id number]  ]

## 2010-04-13 NOTE — Consult Note (Signed)
Summary: [facility]  [facility]   Imported By: De Nurse 05/27/2008 12:20:37  _____________________________________________________________________  External Attachment:    Type:   Image     Comment:   External Document

## 2010-04-15 NOTE — Assessment & Plan Note (Signed)
Summary: back pain/eo   Vital Signs:  Patient profile:   45 year old female Weight:      230 pounds Temp:     98.6 degrees F oral Pulse rate:   105 / minute BP sitting:   157 / 95  (left arm) Cuff size:   large CC: back pain Is Patient Diabetic? No Pain Assessment Patient in pain? yes     Location: lower back Intensity: 8 Type: heaviness Onset of pain  Chronic Comments pt states that she is also having some sinus problems   Primary Care Provider:  Romero Belling, MD  CC:  back pain.  History of Present Illness: Two issues: Low back pain.  Previously followed by [facility] and was getting regular injections Her neurosurgeon moved. Second issue is sinus congestion.  States she  seems to be improving some after a prescription for azithromicin she received elsewhere.     Current Medications (verified): 1)  Metoprolol Tartrate 100 Mg  Tabs (Metoprolol Tartrate) .... 2 Tabs By Mouth Two Times A Day 2)  Diovan Hct 160-12.5 Mg Tabs (Valsartan-Hydrochlorothiazide) .... Take 1 Pill By Mouth Once Daily 3)  Premarin 0.3 Mg Tabs (Estrogens Conjugated) .... Take 1 Pill By Mouth Once Daily 4)  Carafate 1 Gm/49ml Susp (Sucralfate) .... Take 1 Gram 4 Times Daily, Between Meals and At Bedtime. 5)  Ventolin Hfa 108 (90 Base) Mcg/act Aers (Albuterol Sulfate) .... 2 Puffs Inhaled Every 4 Hours As Needed For Cough, Wheeze, Shortness of Breath 6)  Chantix 0.5 Mg Tabs (Varenicline Tartrate) .... Take One Daily For 7 Days Then Increase To Two Times A Day With Meals. 7)  Fluticasone Propionate 50 Mcg/act Susp (Fluticasone Propionate) .Marland Kitchen.. 1 Spray Each Nostril Daily 8)  Hydroxyzine Hcl 25 Mg Tabs (Hydroxyzine Hcl) .... Take 1-2 Tables Every 6 Hours As Needed For Itching 9)  Betamethasone Dipropionate 0.05 % Oint (Betamethasone Dipropionate) .... Apply To Affected Area Three Times A Day. Dispense 15 Gm Tube 10)  Amlodipine Besylate 10 Mg Tabs (Amlodipine Besylate) .... Once Daily 11)  Gabapentin 300 Mg Caps  (Gabapentin) .... One By Mouth Tid 12)  Lidoderm 5 % Ptch (Lidocaine) .... Apply One Patch Daily To Area of Greatest Pain  Allergies (verified): 1)  ! Codeine 2)  ! Penicillin  Past History:  Past medical, surgical, family and social histories (including risk factors) reviewed, and no changes noted (except as noted below).  Past Medical History: Reviewed history from 04/17/2009 and no changes required. 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 ADENO CA OF LEFT LUNG 1/10-S/P RESECTION GLAUCOMA HYPERTENSION  Sinusitis depression obesity low back pain disk herniation migraines DM TYPE II- 11/08- new dx      Past Surgical History: Reviewed history from 04/17/2009 and no changes required. BTL - 09/06/2000, c-s x3 - 08/25/2000, eye surgeries - 08/25/2000, Hysterectomy - Partial - 09/11/2001, Spirometry - mild - mod obs dz - 10/12/2004 knee- arthroscopy on R 2009 repair of torn ligaments on L 2008- Dr. Christian Mate, Dr. Lajoyce Corners did L.    Left lung lobectomy-for lung cancer  Family History: Reviewed history from 04/17/2009 and no changes required. F- died unknown causes, GF-CAD, GM-DM, htn, M-htn, DM, mom with early menopause, no h/o breast, u x2-CAD h/o of 2 MIs. daughter , sister, and cousins with migraines.  mother sometimes has a migraine Moterh with MIs x 2 CVAs in maternal aunt.  .     heart disease: mother, maternal grandfather, maternal uncles cancer: maternal aunts (brain, stomach,  lung)  Family History of Colon Cancer: Great Aunt Family History of Liver Cancer:Aunt Family History of Stomach Cancer: Aunt  Social History: Reviewed history from 04/17/2009 and no changes required. quit smoking in Sept 2009, former 1 to 1 1/2 ppd smoker since age 13  pt isMARRIED pt works for [employer].  Alcohol Use - yes-2 x per month Illicit Drug Use - no Patient gets regular exercise. Daily Caffeine Use-1-2 cups daily  Physical Exam  General:   Well-developed,well-nourished,in no acute distress; alert,appropriate and cooperative throughout examination Ears:  External ear exam shows no significant lesions or deformities.  Otoscopic examination reveals clear canals, tympanic membranes are intact bilaterally without bulging, retraction, inflammation or discharge. Hearing is grossly normal bilaterally. Mouth:  Oral mucosa and oropharynx without lesions or exudates.  Teeth in good repair. Extremities:  Significant lumbar spasm and loss of lumbar lordosis.  Does have normal strength and DTRs   Impression & Recommendations:  Problem # 1:  LOW BACK PAIN (ICD-724.2)  chronic and worse now that not seeing neurosurg.  I have restarted the meds that she had previously been on and that helped her.  Asked her to follow up with PCP in 3-4 weeks to see what further steps would be needed.  Orders: [facility]- Est Level  3 (91478)  Complete Medication List: 1)  Metoprolol Tartrate 100 Mg Tabs (Metoprolol tartrate) .... 2 tabs by mouth two times a day 2)  Diovan Hct 160-12.5 Mg Tabs (Valsartan-hydrochlorothiazide) .... Take 1 pill by mouth once daily 3)  Premarin 0.3 Mg Tabs (Estrogens conjugated) .... Take 1 pill by mouth once daily 4)  Carafate 1 Gm/35ml Susp (Sucralfate) .... Take 1 gram 4 times daily, between meals and at bedtime. 5)  Ventolin Hfa 108 (90 Base) Mcg/act Aers (Albuterol sulfate) .... 2 puffs inhaled every 4 hours as needed for cough, wheeze, shortness of breath 6)  Chantix 0.5 Mg Tabs (Varenicline tartrate) .... Take one daily for 7 days then increase to two times a day with meals. 7)  Fluticasone Propionate 50 Mcg/act Susp (Fluticasone propionate) .Marland Kitchen.. 1 spray each nostril daily 8)  Hydroxyzine Hcl 25 Mg Tabs (Hydroxyzine hcl) .... Take 1-2 tables every 6 hours as needed for itching 9)  Betamethasone Dipropionate 0.05 % Oint (Betamethasone dipropionate) .... Apply to affected area three times a day. dispense 15 gm tube 10)  Amlodipine  Besylate 10 Mg Tabs (Amlodipine besylate) .... Once daily 11)  Gabapentin 300 Mg Caps (Gabapentin) .... One by mouth tid 12)  Lidoderm 5 % Ptch (Lidocaine) .... Apply one patch daily to area of greatest pain Prescriptions: LIDODERM 5 % PTCH (LIDOCAINE) apply one patch daily to area of greatest pain  #30 x 3   Entered and Authorized by:   Doralee Albino MD   Signed by:   Doralee Albino MD on 03/19/2010   Method used:   Electronically to        [facility] [id number] (retail)       [address] [place], [place]  [address]       Ph: [phone]       Fax: [phone]   RxID:   [id number] GABAPENTIN 300 MG CAPS (GABAPENTIN) one by mouth tid  #90 x 3   Entered and Authorized by:   Doralee Albino MD   Signed by:   Doralee Albino MD on 03/19/2010   Method used:  Electronically to        [facility] [id number] (retail)       [address] [place], [place]  [address]       Ph: [phone]       Fax: [phone]   RxID:   [id number]    Orders Added: 1)  [facility]- Est Level  3 [84132]

## 2010-04-21 NOTE — Progress Notes (Signed)
Summary: Rx  Phone Note Refill Request Call back at Home Phone [phone]   Refills Requested: Medication #1:  METOPROLOL TARTRATE 100 MG  TABS 2 tabs by mouth two times a day Initial call taken by: Knox Royalty,  April 12, 2010 9:04 AM    Prescriptions: METOPROLOL TARTRATE 100 MG  TABS (METOPROLOL TARTRATE) 2 tabs by mouth two times a day  #120 x 0   Entered by:   Theresia Lo RN   Authorized by:   Angelena Sole MD   Signed by:   Theresia Lo RN on 04/12/2010   Method used:   Electronically to        [facility] [id number] (retail)       [address] [place], [place]  [address]       Ph: [phone]       Fax: [phone]   RxID:   [id number]  patient left message that she has been trying to get refill for a week. tried calling patient , left message on voicemail that she  needs to call for appointment with PCP before next refill is needed.  sent message to pharmacy also. patient left Theresia Lo RN  April 12, 2010 9:52 AM  Appended Document: Rx patient scheduled appointment for 05/07/2010.

## 2010-05-07 ENCOUNTER — Ambulatory Visit: Payer: Self-pay | Admitting: Family Medicine

## 2010-05-07 ENCOUNTER — Encounter: Payer: MEDICARE | Admitting: Internal Medicine

## 2010-05-07 DIAGNOSIS — D45 Polycythemia vera: Secondary | ICD-10-CM

## 2010-05-07 DIAGNOSIS — C3410 Malignant neoplasm of upper lobe, unspecified bronchus or lung: Secondary | ICD-10-CM

## 2010-05-07 LAB — CBC WITH DIFFERENTIAL/PLATELET
BASO%: 0.3 % (ref 0.0–2.0)
Basophils Absolute: 0 10*3/uL (ref 0.0–0.1)
EOS%: 1.7 % (ref 0.0–7.0)
Eosinophils Absolute: 0.2 10*3/uL (ref 0.0–0.5)
HCT: 45.5 % (ref 34.8–46.6)
HGB: 15.3 g/dL (ref 11.6–15.9)
LYMPH%: 33.5 % (ref 14.0–49.7)
MCH: 30.7 pg (ref 25.1–34.0)
MCHC: 33.7 g/dL (ref 31.5–36.0)
MCV: 91 fL (ref 79.5–101.0)
MONO#: 0.8 10*3/uL (ref 0.1–0.9)
MONO%: 7 % (ref 0.0–14.0)
NEUT#: 6.6 10*3/uL — ABNORMAL HIGH (ref 1.5–6.5)
NEUT%: 57.5 % (ref 38.4–76.8)
Platelets: 253 10*3/uL (ref 145–400)
RBC: 5 10*6/uL (ref 3.70–5.45)
RDW: 14 % (ref 11.2–14.5)
WBC: 11.6 10*3/uL — ABNORMAL HIGH (ref 3.9–10.3)
lymph#: 3.9 10*3/uL — ABNORMAL HIGH (ref 0.9–3.3)

## 2010-05-07 LAB — COMPREHENSIVE METABOLIC PANEL
ALT: 15 U/L (ref 0–35)
AST: 11 U/L (ref 0–37)
Albumin: 4.3 g/dL (ref 3.5–5.2)
Alkaline Phosphatase: 47 U/L (ref 39–117)
BUN: 14 mg/dL (ref 6–23)
CO2: 27 mEq/L (ref 19–32)
Calcium: 9.2 mg/dL (ref 8.4–10.5)
Chloride: 105 mEq/L (ref 96–112)
Creatinine, Ser: 1.22 mg/dL — ABNORMAL HIGH (ref 0.40–1.20)
Glucose, Bld: 127 mg/dL — ABNORMAL HIGH (ref 70–99)
Potassium: 3.7 mEq/L (ref 3.5–5.3)
Sodium: 142 mEq/L (ref 135–145)
Total Bilirubin: 0.6 mg/dL (ref 0.3–1.2)
Total Protein: 6.8 g/dL (ref 6.0–8.3)

## 2010-05-10 ENCOUNTER — Encounter (HOSPITAL_COMMUNITY): Payer: Self-pay

## 2010-05-10 ENCOUNTER — Ambulatory Visit (HOSPITAL_COMMUNITY)
Admission: RE | Admit: 2010-05-10 | Discharge: 2010-05-10 | Disposition: A | Discharge status: Home / Self Care | Payer: MEDICARE | Source: Ambulatory Visit | Attending: Oncology | Admitting: Oncology

## 2010-05-10 DIAGNOSIS — R0602 Shortness of breath: Secondary | ICD-10-CM | POA: Insufficient documentation

## 2010-05-10 DIAGNOSIS — R079 Chest pain, unspecified: Secondary | ICD-10-CM | POA: Insufficient documentation

## 2010-05-10 DIAGNOSIS — R05 Cough: Secondary | ICD-10-CM | POA: Insufficient documentation

## 2010-05-10 DIAGNOSIS — C3490 Malignant neoplasm of unspecified part of unspecified bronchus or lung: Secondary | ICD-10-CM | POA: Insufficient documentation

## 2010-05-10 DIAGNOSIS — R059 Cough, unspecified: Secondary | ICD-10-CM | POA: Insufficient documentation

## 2010-05-10 HISTORY — DX: Malignant neoplasm of unspecified part of unspecified bronchus or lung: C34.90

## 2010-05-10 MED ORDER — IOHEXOL 300 MG/ML  SOLN
80.0000 mL | Freq: Once | INTRAMUSCULAR | Status: AC | PRN
  Administered 2010-05-10: 80 mL via INTRAVENOUS

## 2010-05-12 ENCOUNTER — Other Ambulatory Visit: Payer: Self-pay | Admitting: Internal Medicine

## 2010-05-12 ENCOUNTER — Encounter (HOSPITAL_BASED_OUTPATIENT_CLINIC_OR_DEPARTMENT_OTHER): Payer: Medicare Other | Admitting: Internal Medicine

## 2010-05-12 DIAGNOSIS — C3410 Malignant neoplasm of upper lobe, unspecified bronchus or lung: Secondary | ICD-10-CM

## 2010-05-12 DIAGNOSIS — C3490 Malignant neoplasm of unspecified part of unspecified bronchus or lung: Secondary | ICD-10-CM

## 2010-05-17 ENCOUNTER — Encounter: Payer: Self-pay | Admitting: Family Medicine

## 2010-05-17 ENCOUNTER — Ambulatory Visit (INDEPENDENT_AMBULATORY_CARE_PROVIDER_SITE_OTHER): Payer: MEDICARE | Admitting: Family Medicine

## 2010-05-17 VITALS — BP 170/100 | HR 80 | Temp 98.6°F | Ht 63.0 in | Wt 228.6 lb

## 2010-05-17 DIAGNOSIS — F339 Major depressive disorder, recurrent, unspecified: Secondary | ICD-10-CM

## 2010-05-17 DIAGNOSIS — I10 Essential (primary) hypertension: Secondary | ICD-10-CM

## 2010-05-17 DIAGNOSIS — M79609 Pain in unspecified limb: Secondary | ICD-10-CM

## 2010-05-17 DIAGNOSIS — N183 Chronic kidney disease, stage 3 unspecified: Secondary | ICD-10-CM | POA: Insufficient documentation

## 2010-05-17 DIAGNOSIS — M79605 Pain in left leg: Secondary | ICD-10-CM

## 2010-05-17 MED ORDER — CARVEDILOL 25 MG PO TABS: 25.0000 mg | ORAL_TABLET | Freq: Two times a day (BID) | ORAL | Status: DC

## 2010-05-17 MED ORDER — VALSARTAN-HYDROCHLOROTHIAZIDE 160-12.5 MG PO TABS: 1.0000 | ORAL_TABLET | Freq: Every day | ORAL | Status: DC

## 2010-05-17 MED ORDER — AMLODIPINE BESYLATE 10 MG PO TABS: 10.0000 mg | ORAL_TABLET | Freq: Every day | ORAL | Status: DC

## 2010-05-17 MED ORDER — GABAPENTIN 300 MG PO CAPS: 600.0000 mg | ORAL_CAPSULE | Freq: Three times a day (TID) | ORAL | Status: DC

## 2010-05-17 MED ORDER — CITALOPRAM HYDROBROMIDE 20 MG PO TABS: 20.0000 mg | ORAL_TABLET | Freq: Every day | ORAL | Status: DC

## 2010-05-17 NOTE — Assessment & Plan Note (Signed)
No red flags or signs of serious pathology.  Sounds most c/w neuropathy.  Will titrate up Neurontin.

## 2010-05-17 NOTE — Assessment & Plan Note (Signed)
Creatinine near baseline at 1.22.  Will continue to monitor.  If starts to increase will send to Nephrology.

## 2010-05-17 NOTE — Assessment & Plan Note (Signed)
Not at goal.  Patient is taking her medications as prescribed.  Will change from Metoprolol to Carvedilol to see if we can get some better blood pressure control.

## 2010-05-17 NOTE — Patient Instructions (Signed)
We are going to make some changes today For your pain, I want you to start taking Gabapentin 600mg  three times a day For your blood pressure we are going to switch from Metoprolol to Carvedilol For the depression, try Celexa Your kidneys look like they are functioning about the same as they have been for the past couple of years, we do not need to pursue further work up at this time Please schedule a follow up appointment in 4-6 weeks

## 2010-05-17 NOTE — Assessment & Plan Note (Signed)
Hx of depression.  She stopped taking Zoloft because she thought that it was making her dizzy.  Didn't want to try Prozac.  Will start Celexa.

## 2010-05-17 NOTE — Progress Notes (Signed)
  Subjective:    Patient ID: Theresa [name], female    DOB: [birth year], 45 y.o.   MRN: [id number]  HPI 1. Leg pain:  Worse in the left leg but also in the right leg.  Described as a "tightness and tingling".  Located in both legs in the back of the calves and at the bottom of her feet.  Worse after being on her feet all day.  Minimal swelling  2. HTN:  She is taking her medications as prescribed.  Doesn't check her blood pressure at home regularly.  ROS: denies chest pain, shortness of breath  3. Depression:  Has a hx of depression.  Has been on Zoloft and Prozac before.  Was most recently on Zoloft but stopped taking it because she didn't like the way that it made her feel.  She is having more depression symptoms now.  ROS: Denies SI, endorses depressed mood, crying spells  4. Chronic kidney disease:  Was told by her heme/onc doctor that her kidney tests were elevated.  She drinks a lot of water but is always thirsty   Review of Systems  Constitutional: Negative for fever.  Respiratory: Negative for chest tightness and shortness of breath.   Cardiovascular: Positive for leg swelling.  Genitourinary: Negative for difficulty urinating.  Musculoskeletal: Positive for back pain. Negative for joint swelling, arthralgias and gait problem.  Neurological: Negative for dizziness, weakness, numbness and headaches.  Psychiatric/Behavioral: Negative for decreased concentration and agitation.       Objective:   Physical Exam  Constitutional: No distress.       Obese  Eyes:       Fundoscopic exam benign  Neck: Normal range of motion. Neck supple.  Cardiovascular: Normal rate and regular rhythm.   Pulmonary/Chest: Effort normal and breath sounds normal. No respiratory distress. She has no wheezes.  Musculoskeletal:       Left leg:  Normal ROM.  Trace LE edema.  TTP along posterior calf and bottom of feet.  Right leg:  Normal ROM.  Trace LE edema.  TTP along posterior calf and bottom of feet.   Skin: Skin is warm and dry. No rash noted.  Psychiatric:       Flat affect          Assessment & Plan:

## 2010-05-24 LAB — POCT I-STAT, CHEM 8
BUN: 9 mg/dL (ref 6–23)
Calcium, Ion: 1 mmol/L — ABNORMAL LOW (ref 1.12–1.32)
Chloride: 105 mEq/L (ref 96–112)
Creatinine, Ser: 1.3 mg/dL — ABNORMAL HIGH (ref 0.4–1.2)
Glucose, Bld: 108 mg/dL — ABNORMAL HIGH (ref 70–99)
HCT: 53 % — ABNORMAL HIGH (ref 36.0–46.0)
Hemoglobin: 18 g/dL — ABNORMAL HIGH (ref 12.0–15.0)
Potassium: 3.4 mEq/L — ABNORMAL LOW (ref 3.5–5.1)
Sodium: 141 mEq/L (ref 135–145)
TCO2: 27 mmol/L (ref 0–100)

## 2010-05-24 LAB — URINALYSIS, ROUTINE W REFLEX MICROSCOPIC
Bilirubin Urine: NEGATIVE
Glucose, UA: NEGATIVE mg/dL
Hgb urine dipstick: NEGATIVE
Ketones, ur: NEGATIVE mg/dL
Leukocytes, UA: NEGATIVE
Nitrite: NEGATIVE
Protein, ur: 30 mg/dL — AB
Specific Gravity, Urine: 1.022 (ref 1.005–1.030)
Urobilinogen, UA: 0.2 mg/dL (ref 0.0–1.0)
pH: 6.5 (ref 5.0–8.0)

## 2010-05-24 LAB — URINE MICROSCOPIC-ADD ON

## 2010-05-30 LAB — POCT URINALYSIS DIP (DEVICE)
Bilirubin Urine: NEGATIVE
Glucose, UA: NEGATIVE mg/dL
Hgb urine dipstick: NEGATIVE
Ketones, ur: NEGATIVE mg/dL
Nitrite: NEGATIVE
Protein, ur: NEGATIVE mg/dL
Specific Gravity, Urine: 1.01 (ref 1.005–1.030)
Urobilinogen, UA: 0.2 mg/dL (ref 0.0–1.0)
pH: 7 (ref 5.0–8.0)

## 2010-05-30 LAB — POCT PREGNANCY, URINE: Preg Test, Ur: NEGATIVE

## 2010-06-02 LAB — GLUCOSE, CAPILLARY
Glucose-Capillary: 110 mg/dL — ABNORMAL HIGH (ref 70–99)
Glucose-Capillary: 112 mg/dL — ABNORMAL HIGH (ref 70–99)

## 2010-06-19 LAB — GLUCOSE, CAPILLARY: Glucose-Capillary: 89 mg/dL (ref 70–99)

## 2010-06-28 ENCOUNTER — Ambulatory Visit (INDEPENDENT_AMBULATORY_CARE_PROVIDER_SITE_OTHER): Payer: MEDICARE | Admitting: Family Medicine

## 2010-06-28 VITALS — BP 174/106 | HR 88 | Temp 99.0°F | Ht 63.5 in | Wt 228.0 lb

## 2010-06-28 DIAGNOSIS — I10 Essential (primary) hypertension: Secondary | ICD-10-CM

## 2010-06-28 DIAGNOSIS — N183 Chronic kidney disease, stage 3 unspecified: Secondary | ICD-10-CM

## 2010-06-28 DIAGNOSIS — M549 Dorsalgia, unspecified: Secondary | ICD-10-CM | POA: Insufficient documentation

## 2010-06-28 LAB — GLUCOSE, CAPILLARY
Glucose-Capillary: 101 mg/dL — ABNORMAL HIGH (ref 70–99)
Glucose-Capillary: 103 mg/dL — ABNORMAL HIGH (ref 70–99)
Glucose-Capillary: 111 mg/dL — ABNORMAL HIGH (ref 70–99)
Glucose-Capillary: 117 mg/dL — ABNORMAL HIGH (ref 70–99)
Glucose-Capillary: 125 mg/dL — ABNORMAL HIGH (ref 70–99)
Glucose-Capillary: 128 mg/dL — ABNORMAL HIGH (ref 70–99)
Glucose-Capillary: 140 mg/dL — ABNORMAL HIGH (ref 70–99)
Glucose-Capillary: 142 mg/dL — ABNORMAL HIGH (ref 70–99)
Glucose-Capillary: 143 mg/dL — ABNORMAL HIGH (ref 70–99)
Glucose-Capillary: 146 mg/dL — ABNORMAL HIGH (ref 70–99)
Glucose-Capillary: 146 mg/dL — ABNORMAL HIGH (ref 70–99)
Glucose-Capillary: 160 mg/dL — ABNORMAL HIGH (ref 70–99)
Glucose-Capillary: 166 mg/dL — ABNORMAL HIGH (ref 70–99)
Glucose-Capillary: 171 mg/dL — ABNORMAL HIGH (ref 70–99)
Glucose-Capillary: 175 mg/dL — ABNORMAL HIGH (ref 70–99)
Glucose-Capillary: 206 mg/dL — ABNORMAL HIGH (ref 70–99)
Glucose-Capillary: 269 mg/dL — ABNORMAL HIGH (ref 70–99)
Glucose-Capillary: 88 mg/dL (ref 70–99)
Glucose-Capillary: 105 mg/dL — ABNORMAL HIGH (ref 70–99)
Glucose-Capillary: 108 mg/dL — ABNORMAL HIGH (ref 70–99)
Glucose-Capillary: 111 mg/dL — ABNORMAL HIGH (ref 70–99)
Glucose-Capillary: 111 mg/dL — ABNORMAL HIGH (ref 70–99)
Glucose-Capillary: 113 mg/dL — ABNORMAL HIGH (ref 70–99)
Glucose-Capillary: 120 mg/dL — ABNORMAL HIGH (ref 70–99)
Glucose-Capillary: 120 mg/dL — ABNORMAL HIGH (ref 70–99)
Glucose-Capillary: 121 mg/dL — ABNORMAL HIGH (ref 70–99)
Glucose-Capillary: 121 mg/dL — ABNORMAL HIGH (ref 70–99)
Glucose-Capillary: 124 mg/dL — ABNORMAL HIGH (ref 70–99)
Glucose-Capillary: 125 mg/dL — ABNORMAL HIGH (ref 70–99)
Glucose-Capillary: 129 mg/dL — ABNORMAL HIGH (ref 70–99)
Glucose-Capillary: 136 mg/dL — ABNORMAL HIGH (ref 70–99)
Glucose-Capillary: 139 mg/dL — ABNORMAL HIGH (ref 70–99)
Glucose-Capillary: 141 mg/dL — ABNORMAL HIGH (ref 70–99)
Glucose-Capillary: 144 mg/dL — ABNORMAL HIGH (ref 70–99)
Glucose-Capillary: 147 mg/dL — ABNORMAL HIGH (ref 70–99)
Glucose-Capillary: 148 mg/dL — ABNORMAL HIGH (ref 70–99)
Glucose-Capillary: 154 mg/dL — ABNORMAL HIGH (ref 70–99)
Glucose-Capillary: 158 mg/dL — ABNORMAL HIGH (ref 70–99)
Glucose-Capillary: 171 mg/dL — ABNORMAL HIGH (ref 70–99)
Glucose-Capillary: 58 mg/dL — ABNORMAL LOW (ref 70–99)
Glucose-Capillary: 71 mg/dL (ref 70–99)
Glucose-Capillary: 72 mg/dL (ref 70–99)
Glucose-Capillary: 73 mg/dL (ref 70–99)
Glucose-Capillary: 73 mg/dL (ref 70–99)
Glucose-Capillary: 74 mg/dL (ref 70–99)
Glucose-Capillary: 82 mg/dL (ref 70–99)
Glucose-Capillary: 82 mg/dL (ref 70–99)
Glucose-Capillary: 86 mg/dL (ref 70–99)
Glucose-Capillary: 87 mg/dL (ref 70–99)
Glucose-Capillary: 90 mg/dL (ref 70–99)
Glucose-Capillary: 95 mg/dL (ref 70–99)
Glucose-Capillary: 97 mg/dL (ref 70–99)
Glucose-Capillary: 98 mg/dL (ref 70–99)
Glucose-Capillary: 99 mg/dL (ref 70–99)

## 2010-06-28 LAB — CBC
HCT: 34 % — ABNORMAL LOW (ref 36.0–46.0)
HCT: 34.9 % — ABNORMAL LOW (ref 36.0–46.0)
HCT: 35.2 % — ABNORMAL LOW (ref 36.0–46.0)
HCT: 41 % (ref 36.0–46.0)
HCT: 47.7 % — ABNORMAL HIGH (ref 36.0–46.0)
HCT: 28.6 % — ABNORMAL LOW (ref 36.0–46.0)
HCT: 30.2 % — ABNORMAL LOW (ref 36.0–46.0)
HCT: 30.3 % — ABNORMAL LOW (ref 36.0–46.0)
HCT: 30.8 % — ABNORMAL LOW (ref 36.0–46.0)
HCT: 30.8 % — ABNORMAL LOW (ref 36.0–46.0)
HCT: 31.6 % — ABNORMAL LOW (ref 36.0–46.0)
HCT: 31.9 % — ABNORMAL LOW (ref 36.0–46.0)
HCT: 32.7 % — ABNORMAL LOW (ref 36.0–46.0)
HCT: 34.2 % — ABNORMAL LOW (ref 36.0–46.0)
HCT: 34.5 % — ABNORMAL LOW (ref 36.0–46.0)
Hemoglobin: 11.1 g/dL — ABNORMAL LOW (ref 12.0–15.0)
Hemoglobin: 11.6 g/dL — ABNORMAL LOW (ref 12.0–15.0)
Hemoglobin: 11.6 g/dL — ABNORMAL LOW (ref 12.0–15.0)
Hemoglobin: 13.8 g/dL (ref 12.0–15.0)
Hemoglobin: 15.7 g/dL — ABNORMAL HIGH (ref 12.0–15.0)
Hemoglobin: 10 g/dL — ABNORMAL LOW (ref 12.0–15.0)
Hemoglobin: 10.2 g/dL — ABNORMAL LOW (ref 12.0–15.0)
Hemoglobin: 10.2 g/dL — ABNORMAL LOW (ref 12.0–15.0)
Hemoglobin: 10.3 g/dL — ABNORMAL LOW (ref 12.0–15.0)
Hemoglobin: 10.4 g/dL — ABNORMAL LOW (ref 12.0–15.0)
Hemoglobin: 10.6 g/dL — ABNORMAL LOW (ref 12.0–15.0)
Hemoglobin: 11.4 g/dL — ABNORMAL LOW (ref 12.0–15.0)
Hemoglobin: 11.4 g/dL — ABNORMAL LOW (ref 12.0–15.0)
Hemoglobin: 9.7 g/dL — ABNORMAL LOW (ref 12.0–15.0)
Hemoglobin: 9.7 g/dL — ABNORMAL LOW (ref 12.0–15.0)
MCHC: 32.8 g/dL (ref 30.0–36.0)
MCHC: 33 g/dL (ref 30.0–36.0)
MCHC: 33 g/dL (ref 30.0–36.0)
MCHC: 33.2 g/dL (ref 30.0–36.0)
MCHC: 33.6 g/dL (ref 30.0–36.0)
MCHC: 32.2 g/dL (ref 30.0–36.0)
MCHC: 32.6 g/dL (ref 30.0–36.0)
MCHC: 32.6 g/dL (ref 30.0–36.0)
MCHC: 32.6 g/dL (ref 30.0–36.0)
MCHC: 32.8 g/dL (ref 30.0–36.0)
MCHC: 33 g/dL (ref 30.0–36.0)
MCHC: 33.1 g/dL (ref 30.0–36.0)
MCHC: 33.3 g/dL (ref 30.0–36.0)
MCHC: 33.7 g/dL (ref 30.0–36.0)
MCHC: 34 g/dL (ref 30.0–36.0)
MCV: 89.2 fL (ref 78.0–100.0)
MCV: 90 fL (ref 78.0–100.0)
MCV: 90.4 fL (ref 78.0–100.0)
MCV: 90.4 fL (ref 78.0–100.0)
MCV: 90.6 fL (ref 78.0–100.0)
MCV: 88.6 fL (ref 78.0–100.0)
MCV: 88.9 fL (ref 78.0–100.0)
MCV: 89.7 fL (ref 78.0–100.0)
MCV: 89.8 fL (ref 78.0–100.0)
MCV: 89.8 fL (ref 78.0–100.0)
MCV: 90.1 fL (ref 78.0–100.0)
MCV: 90.3 fL (ref 78.0–100.0)
MCV: 90.3 fL (ref 78.0–100.0)
MCV: 90.3 fL (ref 78.0–100.0)
MCV: 90.3 fL (ref 78.0–100.0)
Platelets: 188 10*3/uL (ref 150–400)
Platelets: 205 10*3/uL (ref 150–400)
Platelets: 247 10*3/uL (ref 150–400)
Platelets: 261 10*3/uL (ref 150–400)
Platelets: 263 10*3/uL (ref 150–400)
Platelets: 271 10*3/uL (ref 150–400)
Platelets: 284 10*3/uL (ref 150–400)
Platelets: 292 10*3/uL (ref 150–400)
Platelets: 313 10*3/uL (ref 150–400)
Platelets: 334 10*3/uL (ref 150–400)
Platelets: 394 10*3/uL (ref 150–400)
Platelets: 403 10*3/uL — ABNORMAL HIGH (ref 150–400)
Platelets: 437 10*3/uL — ABNORMAL HIGH (ref 150–400)
Platelets: 438 10*3/uL — ABNORMAL HIGH (ref 150–400)
Platelets: 467 10*3/uL — ABNORMAL HIGH (ref 150–400)
RBC: 3.76 MIL/uL — ABNORMAL LOW (ref 3.87–5.11)
RBC: 3.86 MIL/uL — ABNORMAL LOW (ref 3.87–5.11)
RBC: 3.89 MIL/uL (ref 3.87–5.11)
RBC: 4.59 MIL/uL (ref 3.87–5.11)
RBC: 5.3 MIL/uL — ABNORMAL HIGH (ref 3.87–5.11)
RBC: 3.23 MIL/uL — ABNORMAL LOW (ref 3.87–5.11)
RBC: 3.35 MIL/uL — ABNORMAL LOW (ref 3.87–5.11)
RBC: 3.41 MIL/uL — ABNORMAL LOW (ref 3.87–5.11)
RBC: 3.41 MIL/uL — ABNORMAL LOW (ref 3.87–5.11)
RBC: 3.41 MIL/uL — ABNORMAL LOW (ref 3.87–5.11)
RBC: 3.52 MIL/uL — ABNORMAL LOW (ref 3.87–5.11)
RBC: 3.55 MIL/uL — ABNORMAL LOW (ref 3.87–5.11)
RBC: 3.62 MIL/uL — ABNORMAL LOW (ref 3.87–5.11)
RBC: 3.8 MIL/uL — ABNORMAL LOW (ref 3.87–5.11)
RBC: 3.85 MIL/uL — ABNORMAL LOW (ref 3.87–5.11)
RDW: 13.5 % (ref 11.5–15.5)
RDW: 13.6 % (ref 11.5–15.5)
RDW: 13.6 % (ref 11.5–15.5)
RDW: 13.8 % (ref 11.5–15.5)
RDW: 13.8 % (ref 11.5–15.5)
RDW: 13.4 % (ref 11.5–15.5)
RDW: 13.5 % (ref 11.5–15.5)
RDW: 13.7 % (ref 11.5–15.5)
RDW: 13.7 % (ref 11.5–15.5)
RDW: 13.8 % (ref 11.5–15.5)
RDW: 13.8 % (ref 11.5–15.5)
RDW: 14 % (ref 11.5–15.5)
RDW: 14.1 % (ref 11.5–15.5)
RDW: 14.2 % (ref 11.5–15.5)
RDW: 14.5 % (ref 11.5–15.5)
WBC: 11.7 10*3/uL — ABNORMAL HIGH (ref 4.0–10.5)
WBC: 12.6 10*3/uL — ABNORMAL HIGH (ref 4.0–10.5)
WBC: 13.4 10*3/uL — ABNORMAL HIGH (ref 4.0–10.5)
WBC: 13.5 10*3/uL — ABNORMAL HIGH (ref 4.0–10.5)
WBC: 15.2 10*3/uL — ABNORMAL HIGH (ref 4.0–10.5)
WBC: 14.2 10*3/uL — ABNORMAL HIGH (ref 4.0–10.5)
WBC: 15.1 10*3/uL — ABNORMAL HIGH (ref 4.0–10.5)
WBC: 15.3 10*3/uL — ABNORMAL HIGH (ref 4.0–10.5)
WBC: 15.4 10*3/uL — ABNORMAL HIGH (ref 4.0–10.5)
WBC: 16.8 10*3/uL — ABNORMAL HIGH (ref 4.0–10.5)
WBC: 17.7 10*3/uL — ABNORMAL HIGH (ref 4.0–10.5)
WBC: 18.1 10*3/uL — ABNORMAL HIGH (ref 4.0–10.5)
WBC: 18.6 10*3/uL — ABNORMAL HIGH (ref 4.0–10.5)
WBC: 21.1 10*3/uL — ABNORMAL HIGH (ref 4.0–10.5)
WBC: 21.4 10*3/uL — ABNORMAL HIGH (ref 4.0–10.5)

## 2010-06-28 LAB — COMPREHENSIVE METABOLIC PANEL
ALT: 16 U/L (ref 0–35)
ALT: 17 U/L (ref 0–35)
ALT: 17 U/L (ref 0–35)
AST: 24 U/L (ref 0–37)
AST: 25 U/L (ref 0–37)
AST: 15 U/L (ref 0–37)
Albumin: 2.8 g/dL — ABNORMAL LOW (ref 3.5–5.2)
Albumin: 4.5 g/dL (ref 3.5–5.2)
Albumin: 4.6 g/dL (ref 3.5–5.2)
Alkaline Phosphatase: 34 U/L — ABNORMAL LOW (ref 39–117)
Alkaline Phosphatase: 67 U/L (ref 39–117)
Alkaline Phosphatase: 42 U/L (ref 39–117)
BUN: 10 mg/dL (ref 6–23)
BUN: 11 mg/dL (ref 6–23)
BUN: 13 mg/dL (ref 6–23)
CO2: 28 mEq/L (ref 19–32)
CO2: 29 mEq/L (ref 19–32)
CO2: 27 mEq/L (ref 19–32)
Calcium: 8 mg/dL — ABNORMAL LOW (ref 8.4–10.5)
Calcium: 9.5 mg/dL (ref 8.4–10.5)
Calcium: 9.5 mg/dL (ref 8.4–10.5)
Chloride: 101 mEq/L (ref 96–112)
Chloride: 103 mEq/L (ref 96–112)
Chloride: 105 mEq/L (ref 96–112)
Creat: 1.2 mg/dL (ref 0.40–1.20)
Creatinine, Ser: 1.13 mg/dL (ref 0.4–1.2)
Creatinine, Ser: 1.46 mg/dL — ABNORMAL HIGH (ref 0.4–1.2)
GFR calc Af Amer: 47 mL/min — ABNORMAL LOW (ref >60)
GFR calc Af Amer: >60 mL/min (ref >60)
GFR calc non Af Amer: 39 mL/min — ABNORMAL LOW (ref >60)
GFR calc non Af Amer: 52 mL/min — ABNORMAL LOW (ref >60)
Glucose, Bld: 131 mg/dL — ABNORMAL HIGH (ref 70–99)
Glucose, Bld: 92 mg/dL (ref 70–99)
Glucose, Bld: 100 mg/dL — ABNORMAL HIGH (ref 70–99)
Potassium: 3.7 mEq/L (ref 3.5–5.1)
Potassium: 3.7 mEq/L (ref 3.5–5.1)
Potassium: 3.8 mEq/L (ref 3.5–5.3)
Sodium: 135 mEq/L (ref 135–145)
Sodium: 135 mEq/L (ref 135–145)
Sodium: 141 mEq/L (ref 135–145)
Total Bilirubin: 0.8 mg/dL (ref 0.3–1.2)
Total Bilirubin: 1.1 mg/dL (ref 0.3–1.2)
Total Bilirubin: 0.7 mg/dL (ref 0.3–1.2)
Total Protein: 5.5 g/dL — ABNORMAL LOW (ref 6.0–8.3)
Total Protein: 7.3 g/dL (ref 6.0–8.3)
Total Protein: 7.3 g/dL (ref 6.0–8.3)

## 2010-06-28 LAB — BASIC METABOLIC PANEL
BUN: 10 mg/dL (ref 6–23)
BUN: 11 mg/dL (ref 6–23)
BUN: 9 mg/dL (ref 6–23)
BUN: 10 mg/dL (ref 6–23)
BUN: 11 mg/dL (ref 6–23)
BUN: 7 mg/dL (ref 6–23)
BUN: 7 mg/dL (ref 6–23)
BUN: 8 mg/dL (ref 6–23)
BUN: 9 mg/dL (ref 6–23)
CO2: 28 mEq/L (ref 19–32)
CO2: 30 mEq/L (ref 19–32)
CO2: 32 mEq/L (ref 19–32)
CO2: 28 mEq/L (ref 19–32)
CO2: 28 mEq/L (ref 19–32)
CO2: 29 mEq/L (ref 19–32)
CO2: 30 mEq/L (ref 19–32)
CO2: 30 mEq/L (ref 19–32)
CO2: 30 mEq/L (ref 19–32)
Calcium: 8.2 mg/dL — ABNORMAL LOW (ref 8.4–10.5)
Calcium: 8.5 mg/dL (ref 8.4–10.5)
Calcium: 8.8 mg/dL (ref 8.4–10.5)
Calcium: 8.3 mg/dL — ABNORMAL LOW (ref 8.4–10.5)
Calcium: 8.4 mg/dL (ref 8.4–10.5)
Calcium: 8.4 mg/dL (ref 8.4–10.5)
Calcium: 8.6 mg/dL (ref 8.4–10.5)
Calcium: 8.6 mg/dL (ref 8.4–10.5)
Calcium: 8.8 mg/dL (ref 8.4–10.5)
Chloride: 101 mEq/L (ref 96–112)
Chloride: 97 mEq/L (ref 96–112)
Chloride: 97 mEq/L (ref 96–112)
Chloride: 100 mEq/L (ref 96–112)
Chloride: 101 mEq/L (ref 96–112)
Chloride: 101 mEq/L (ref 96–112)
Chloride: 102 mEq/L (ref 96–112)
Chloride: 97 mEq/L (ref 96–112)
Chloride: 98 mEq/L (ref 96–112)
Creatinine, Ser: 0.9 mg/dL (ref 0.4–1.2)
Creatinine, Ser: 0.96 mg/dL (ref 0.4–1.2)
Creatinine, Ser: 1.19 mg/dL (ref 0.4–1.2)
Creatinine, Ser: 0.85 mg/dL (ref 0.4–1.2)
Creatinine, Ser: 0.89 mg/dL (ref 0.4–1.2)
Creatinine, Ser: 0.9 mg/dL (ref 0.4–1.2)
Creatinine, Ser: 0.91 mg/dL (ref 0.4–1.2)
Creatinine, Ser: 0.91 mg/dL (ref 0.4–1.2)
Creatinine, Ser: 0.97 mg/dL (ref 0.4–1.2)
GFR calc Af Amer: 60 mL/min — ABNORMAL LOW (ref >60)
GFR calc Af Amer: >60 mL/min (ref >60)
GFR calc Af Amer: >60 mL/min (ref >60)
GFR calc Af Amer: >60 mL/min (ref >60)
GFR calc Af Amer: >60 mL/min (ref >60)
GFR calc Af Amer: >60 mL/min (ref >60)
GFR calc Af Amer: >60 mL/min (ref >60)
GFR calc Af Amer: >60 mL/min (ref >60)
GFR calc Af Amer: >60 mL/min (ref >60)
GFR calc non Af Amer: 49 mL/min — ABNORMAL LOW (ref >60)
GFR calc non Af Amer: >60 mL/min (ref >60)
GFR calc non Af Amer: >60 mL/min (ref >60)
GFR calc non Af Amer: >60 mL/min (ref >60)
GFR calc non Af Amer: >60 mL/min (ref >60)
GFR calc non Af Amer: >60 mL/min (ref >60)
GFR calc non Af Amer: >60 mL/min (ref >60)
GFR calc non Af Amer: >60 mL/min (ref >60)
GFR calc non Af Amer: >60 mL/min (ref >60)
Glucose, Bld: 115 mg/dL — ABNORMAL HIGH (ref 70–99)
Glucose, Bld: 126 mg/dL — ABNORMAL HIGH (ref 70–99)
Glucose, Bld: 127 mg/dL — ABNORMAL HIGH (ref 70–99)
Glucose, Bld: 100 mg/dL — ABNORMAL HIGH (ref 70–99)
Glucose, Bld: 110 mg/dL — ABNORMAL HIGH (ref 70–99)
Glucose, Bld: 128 mg/dL — ABNORMAL HIGH (ref 70–99)
Glucose, Bld: 130 mg/dL — ABNORMAL HIGH (ref 70–99)
Glucose, Bld: 84 mg/dL (ref 70–99)
Glucose, Bld: 93 mg/dL (ref 70–99)
Potassium: 3.4 mEq/L — ABNORMAL LOW (ref 3.5–5.1)
Potassium: 3.7 mEq/L (ref 3.5–5.1)
Potassium: 3.9 mEq/L (ref 3.5–5.1)
Potassium: 4 mEq/L (ref 3.5–5.1)
Potassium: 4.2 mEq/L (ref 3.5–5.1)
Potassium: 4.2 mEq/L (ref 3.5–5.1)
Potassium: 4.3 mEq/L (ref 3.5–5.1)
Potassium: 4.4 mEq/L (ref 3.5–5.1)
Potassium: 4.5 mEq/L (ref 3.5–5.1)
Sodium: 133 mEq/L — ABNORMAL LOW (ref 135–145)
Sodium: 135 mEq/L (ref 135–145)
Sodium: 137 mEq/L (ref 135–145)
Sodium: 133 mEq/L — ABNORMAL LOW (ref 135–145)
Sodium: 133 mEq/L — ABNORMAL LOW (ref 135–145)
Sodium: 136 mEq/L (ref 135–145)
Sodium: 137 mEq/L (ref 135–145)
Sodium: 138 mEq/L (ref 135–145)
Sodium: 139 mEq/L (ref 135–145)

## 2010-06-28 LAB — CULTURE, BAL-QUANTITATIVE
Colony Count: 55000
Colony Count: >=100000

## 2010-06-28 LAB — URINALYSIS, ROUTINE W REFLEX MICROSCOPIC
Bilirubin Urine: NEGATIVE
Glucose, UA: NEGATIVE mg/dL
Hgb urine dipstick: NEGATIVE
Ketones, ur: NEGATIVE mg/dL
Leukocytes, UA: NEGATIVE
Nitrite: NEGATIVE
Protein, ur: 30 mg/dL — AB
Specific Gravity, Urine: 1.024 (ref 1.005–1.030)
Urobilinogen, UA: 1 mg/dL (ref 0.0–1.0)
pH: 6.5 (ref 5.0–8.0)

## 2010-06-28 LAB — POCT I-STAT 3, ART BLOOD GAS (G3+)
Acid-Base Excess: 3 mmol/L — ABNORMAL HIGH (ref 0.0–2.0)
Acid-Base Excess: 3 mmol/L — ABNORMAL HIGH (ref 0.0–2.0)
Bicarbonate: 29.3 mEq/L — ABNORMAL HIGH (ref 20.0–24.0)
Bicarbonate: 29.5 mEq/L — ABNORMAL HIGH (ref 20.0–24.0)
O2 Saturation: 88 %
O2 Saturation: 95 %
Patient temperature: 98
Patient temperature: 98.4
TCO2: 31 mmol/L (ref 0–100)
TCO2: 31 mmol/L (ref 0–100)
pCO2 arterial: 50.5 mmHg — ABNORMAL HIGH (ref 35.0–45.0)
pCO2 arterial: 53.2 mmHg — ABNORMAL HIGH (ref 35.0–45.0)
pH, Arterial: 7.348 — ABNORMAL LOW (ref 7.350–7.400)
pH, Arterial: 7.374 (ref 7.350–7.400)
pO2, Arterial: 57 mmHg — ABNORMAL LOW (ref 80.0–100.0)
pO2, Arterial: 78 mmHg — ABNORMAL LOW (ref 80.0–100.0)

## 2010-06-28 LAB — BLOOD GAS, ARTERIAL
Acid-Base Excess: 2.5 mmol/L — ABNORMAL HIGH (ref 0.0–2.0)
Bicarbonate: 26.4 mEq/L — ABNORMAL HIGH (ref 20.0–24.0)
Drawn by: 206361
FIO2: 0.21 %
O2 Saturation: 96 %
Patient temperature: 98.6
TCO2: 27.6 mmol/L (ref 0–100)
pCO2 arterial: 39.3 mmHg (ref 35.0–45.0)
pH, Arterial: 7.441 — ABNORMAL HIGH (ref 7.350–7.400)
pO2, Arterial: 75.8 mmHg — ABNORMAL LOW (ref 80.0–100.0)

## 2010-06-28 LAB — BODY FLUID CULTURE
Culture: NO GROWTH
Gram Stain: NONE SEEN

## 2010-06-28 LAB — TYPE AND SCREEN
ABO/RH(D): O POS
Antibody Screen: NEGATIVE

## 2010-06-28 LAB — CULTURE, BAL-QUANTITATIVE W GRAM STAIN

## 2010-06-28 LAB — POCT UA - MICROSCOPIC ONLY

## 2010-06-28 LAB — URINE MICROSCOPIC-ADD ON

## 2010-06-28 LAB — POCT URINALYSIS DIPSTICK
Bilirubin, UA: NEGATIVE
Glucose, UA: NEGATIVE
Ketones, UA: NEGATIVE
Leukocytes, UA: NEGATIVE
Nitrite, UA: NEGATIVE
Protein, UA: 100
Spec Grav, UA: 1.02
Urobilinogen, UA: 0.2
pH, UA: 7

## 2010-06-28 LAB — APTT: aPTT: 28 seconds (ref 24–37)

## 2010-06-28 LAB — VANCOMYCIN, TROUGH: Vancomycin Tr: 10.6 ug/mL (ref 10.0–20.0)

## 2010-06-28 LAB — PROTIME-INR
INR: 0.9 (ref 0.00–1.49)
Prothrombin Time: 12.4 seconds (ref 11.6–15.2)

## 2010-06-28 LAB — ABO/RH: ABO/RH(D): O POS

## 2010-06-28 LAB — LIPASE: Lipase: 52 U/L (ref 0–75)

## 2010-06-28 MED ORDER — HYDROCODONE-ACETAMINOPHEN 5-500 MG PO TABS: 1.0000 | ORAL_TABLET | Freq: Four times a day (QID) | ORAL | Status: AC | PRN

## 2010-06-28 MED ORDER — PHENAZOPYRIDINE HCL 95 MG PO TABS: 95.0000 mg | ORAL_TABLET | Freq: Three times a day (TID) | ORAL | Status: AC | PRN

## 2010-06-28 NOTE — Patient Instructions (Addendum)
You are suppose to be on 3 different blood pressure meds. Amlodipine, Diovan-HCT, and Carvedilol. You are 12 days late from picking up your Carvedilol according to you pharmacist at [facility].  He is going to fill that prescription so you can pick it up today.  You last Cr was 1.22 which is not far off your baseline. We will recheck it today. If it is significantly raised then I we will consider a kidney doctor referral but it does not need to be done today.  We are going to look at your kidney, liver and pancreas function today.  We will call you with results.  Don't use Motrin, Advil, Aleve, Ibuprofen, or Aspirin over the next few days until we figure out what your kidney function is doing.  I want you to come back in 1-2 weeks to see Dr. Lelon Perla and discuss your blood pressure as well as have it rechecked.

## 2010-06-28 NOTE — Assessment & Plan Note (Addendum)
You are suppose to be on 3 different blood pressure meds. Amlodipine, Diovan-HCT, and Carvedilol. You are 12 days late from picking up your Carvedilol according to you pharmacist at [facility].  He is going to fill that prescription so you can pick it up today.  I want you to come back in 1-2 weeks to see Dr. Lelon Perla and discuss your blood pressure as well as have it rechecked.

## 2010-06-28 NOTE — Assessment & Plan Note (Signed)
Pt is concerned about worsening kidney function.  Her last Cr was 1.22 which is not far off her baseline. We will recheck it today. If it is significantly raised then I we will consider a kidney doctor referral but it does not need to be done today.  We are going to look at her kidney, liver and pancreas function today.  We will call her with results.

## 2010-06-30 ENCOUNTER — Telehealth: Payer: Self-pay | Admitting: *Deleted

## 2010-06-30 NOTE — Telephone Encounter (Signed)
Message copied by Jimmy Footman on Wed Jun 30, 2010  9:20 AM ------      Message from: Clotilde Dieter, [place]      Created: Tue Jun 29, 2010  9:13 PM      Regarding: Please call this patient       And let this patient know that her labs are the best they have been in a while. Her kidney function is 1.2 which is better than in February, her liver function is good and she does not have an elevated lipase so this is not related to her pancrease.       I did not find a significant cause for her back pain and can not be the one to keep her permanently in a seated job, but if she continues to have back pain she can follow up with Dr. Lelon Perla and discuss her work condition with him. I would make sure that she has good supportive shoes on at work and gets new insoles if she needs them.             ----- Message -----         From: Theresa [name]         Sent: 06/28/2010   9:48 AM           To: Jamie Brookes

## 2010-06-30 NOTE — Telephone Encounter (Signed)
Spoke with patient and informed of below message. Also had informed her that she will have to make an appointment with Dr. Lelon Perla if wanting to be kept as a seated job

## 2010-06-30 NOTE — Telephone Encounter (Signed)
Pharmacy calls stating they do not have pyridium 95 mg , but they do have pyridium 100 mg. Consulted with Dr. McDiarmid and he advises  may change to 100 mg.

## 2010-07-01 NOTE — Assessment & Plan Note (Signed)
PT concerned about back pain being her kidney and she thinks her cancer doctor told her that her kidney function was 2.2 Plan to recheck it today.  I do not suspect a kidney infection with her normal UA.

## 2010-07-01 NOTE — Progress Notes (Signed)
Back pain/Kidney function: Pt has had back pain that stated years ago and waxes and wanes in intensity. However, she has started having worsening back pain in the last few days with nausea, vomiting, and a metallic taste in her mouth.  She has a h/o lung Ca and had surgical resection of the left apex (no chemo or radiation). She recently saw her oncologist and thinks she was told that her Cr was 2.2. She is concerned about her kidney function and wants it tested and a referral to a kidney doctor. She has not seen blood in her urire but has to "push to pee", no burning, some increase in frequency. Poor appetite. Taking Tylenol and Ibuprofen. Left sided abd pain for teh last 2 weeks under left breast.   ROS: neg except as noted. Elevated BP and afebrile.   PE:  Gen: pt appears to be in a little pain but has normal interaction, NAD.  CV: RRR, no murmur Pulm: CTAB, no wheezing Back: some moderate Rt CVA tenderness that is difficult to distinguish from paraspinal pain for the patient.  Abd: diffuse lower abd tenderness and LUQ pain, no masses or LAD.

## 2010-07-27 NOTE — Assessment & Plan Note (Signed)
OFFICE VISIT   SAROYA, RICCOBONO  DOB:  02-23-1966                                        May 28, 2008  CHART #:  [id number]   She came today and her chest x-ray continues to improve.  Her blood  pressure was 130/80, pulse 75, respirations 18, and sats were 98%.  Her  incisions are well healed.  She has been given a release to return to  work.  We will see her again in 2 months with a chest x-ray.   Ines Bloomer, M.D.  Electronically Signed   DPB/MEDQ  D:  05/28/2008  T:  05/29/2008  Job:  [id number]

## 2010-07-27 NOTE — Op Note (Signed)
NAMEANGELE, WIEMANN             ACCOUNT NO.:  [id number]   MEDICAL RECORD NO.:  [id number]          PATIENT TYPE:  INP   LOCATION:  2306                         FACILITY:  [facility]   PHYSICIAN:  Ines Bloomer, M.D. 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   OPERATION PERFORMED:  Left VATS, left thoracotomy, and left upper lobe  trisegmentectomy with node dissection.   SURGEON:  Ines Bloomer, M.D.   ANESTHESIA:  General anesthesia.   After percutaneous insertion of all monitor lines, the patient underwent  general anesthesia, was prepped and draped in usual sterile manner.  A  dual-lumen tube was inserted.  The left lung was deflated.  He was  turned to the left lateral thoracotomy position.  Two trocars were  inserted at the anterior and posterior axillary line at the seventh  intercostal space  2 trocars and a 0-degree scope was inserted.  The  lesion could be seen in the apex of the left upper lobe.  Posterolateral  thoracotomy was made over the fifth intercostal space.  The latissimus  was divided and the serratus was reflected anterior.  The fifth  intercostal space was entered and a TPA was placed in the space.  Dissection was started  anteriorly, dissecting out the superior  pulmonary vein and looping the branches to the apical posterior segment.  Then, we took down the inferior pulmonary ligament with electrocautery  and turned attention to the fissure and dissected out the anterior and  apical posterior and branches.  The anterior branch was two branches and  these were ligated with 0 silk, clipped, and divided.  The apical  posterior branch was stapled with a autosuture 2-mm stapler.  There was  another apparent branch inferior to this  and that was ligated with 0  silk, clipped, and divided.  After this had been done, the superior  pulmonary vein was to the apical posterior branch was divided with the  autosuture 2-mm stapler.  Finally, the bronchus was dissected out .  Then at this time we  dissected outseveral 4L, several 10L and 11L nodes  around the bronchus and then the fissure.  We then stapled and divided  the superior segmental bronchus and the anterior bronchus with the  autosuture 3.5-mm blue stapler.  There were several more nodes in this  area 12 and 11.  They were also dissected free.  Then, we resected the  apical, posterior and anterior branches leaving the lingula and coming  across with the autosuture 60-mm 4.5 stapler with 3 applications.  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  The area  was irrigated copiously.  CoSeal was applied  to the staple line.  Two  chest tubes were placed through the trocar site and tied in place with 0  silk.  Chest was closed with #1 Vicryl as the pericostal drill into the  sixth  rib and passed around the fifth rib, #1 Vicryl in the muscle  layer, and 2-0 Vicryl in the subcutaneous tissue, and Dermabond for the  skin.  Marcaine block was done in the usual  fashion.  A single On-Q was inserted in the usual fashion and two chest  tubes were then placed with the trocar sites with suture in place with 0  silk.  Dry and sterile dressing applied.  The patient returned to the  recovery room in stable condition.      Ines Bloomer, M.D.  Electronically Signed     DPB/MEDQ  D:  03/24/2008  T:  03/25/2008  Job:  [id number]

## 2010-07-27 NOTE — Assessment & Plan Note (Signed)
OFFICE VISIT   BINNIE, DROESSLER  DOB:  [birth year]                                        Aug 05, 2008  CHART #:  [id number]   Her blood pressure was 140/80, pulse 60, respirations 18, and sats were  98%.  She has occasional episodes of some shortness of breath and she  thinks this may be in her throat.  Her chest x-ray showed normal  postoperative changes.  She is doing well overall.  She will get a CT  scan in 3 to 4 months and we will see her back again at that time.   Ines Bloomer, M.D.  Electronically Signed   DPB/MEDQ  D:  08/05/2008  T:  08/06/2008  Job:  [id number]

## 2010-07-27 NOTE — H&P (Signed)
NAMEFRANKIE, Theresa [name]             ACCOUNT NO.:  [id number]   MEDICAL RECORD NO.:  [id number]          PATIENT TYPE:  INP   LOCATION:  NA                           FACILITY:  [facility]   PHYSICIAN:  Ines Bloomer, M.D. DATE OF BIRTH:  1965-11-25   DATE OF ADMISSION:  03/24/2008  DATE OF DISCHARGE:                              HISTORY & PHYSICAL   HISTORY OF PRESENT ILLNESS:  This 45 year old African American female  quit smoking in September 2009.  She had a chest x-ray that showed a  left upper lobe mass.  A CT scan showed a 3 cm left upper lobe mass. She  had a PET scan that was positive in this area and a needle biopsy  revealed adenocarcinoma.  She has had no hemoptysis, fever, chills or  excessive sputum or weight loss.  She is referred for resective therapy.   ALLERGIES:  CODEINE AND PENICILLIN AND CONTRAST DYE AND TO MORPHINE  which causes nausea.   MEDICATIONS:  1. Metformin 500 mg twice daily.  2. Toprol 100 mg twice daily.  3. Lisinopril 40 mg daily.  4. Caduet 40 mg daily.  5. Protonix 40 mg daily.  6. Some type of topical cream.   PAST MEDICAL HISTORY:  1. She has diabetes mellitus, type 2.  2. Chronic eczema.  3. Hypertension.  4. Dyslipidemia.  5. Allergic rhinitis.  6. 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  7. Obesity.  8. Irritable bowel syndrome.  9. Chronic headaches.  10.Onychomycosis.  11.Osteoarthritis.   FAMILY HISTORY:  Positive for vascular disease.   SOCIAL HISTORY:  She is single.  She has four children.  She works as a  Neurosurgeon.  Stopped smoking in September 2009.  Does not drink alcohol  on a regular basis.   REVIEW OF SYSTEMS:  CONSTITUTIONAL:  She is 223 pounds.  She is 5 feet 2  inches.  She has had some mild weight loss.  CARDIAC:  No angina.  No atrial fibrillation.  Shortness of breath with  exertion.  PULMONARY:  She has bronchitis and wheezing.  No hemoptysis.  GI:  She has reflux, peptic ulcer disease,  diarrhea and constipation.  GENITOURINARY:  She has frequent urination.  No kidney disease.  VASCULAR:  She has pain in her legs with walking.  No deep venous  thrombosis or TIA's.  MUSCULOSKELETAL:  She has arthritis.  NEUROLOGIC:  She has headaches and migraine headaches.  PSYCHIATRIC:  She has depressive symptoms.  HEENT:  She has glaucoma and retinitis pigmentosa.  No change in her  overall hearing.   PHYSICAL EXAMINATION:  GENERAL:  She is an obese [place] American  female, in no acute distress.  VITAL SIGNS:  Blood pressure 152/100, respirations 18, saturation 96%,  pulse 68.  Her pulmonary function tests showed an FVC of 2.54, FEV-I of  2.02, with a diffusion capacity corrected of 112%.  HEENT:  Head is atraumatic.  Eyes:  Pupils equal, reactive to light and  accommodation.  Extraocular movements normal.  Ears:  Tympanic membranes  intact.  Nares:  No septal deviation.  Throat:  Stable.  NECK:  Supple without supraclavicular or axillary adenopathy.  HEART:  A regular sinus rhythm, no murmurs.  ABDOMEN:  Soft, no hepatosplenomegaly.  Bowel sounds normal.  EXTREMITIES:  Pulses 2+.  No clubbing or edema.  NEUROLOGIC:  She is oriented x3.  Sensory and motor intact.  Cranial  nerves intact.   IMPRESSION:  1. Left upper lobe lesion.  Rule out adenocarcinoma, non-small-cell      lung cancer.  2. Diabetes mellitus, type 2.  3. Gastroesophageal reflux disease.  4. Irritable bowel syndrome.  5. Chronic headaches.  6. Onychomycosis.   PLAN:  Left video-assisted thoracoscopic surgery, left upper lobectomy,  with node dissection.      Ines Bloomer, M.D.  Electronically Signed     DPB/MEDQ  D:  03/20/2008  T:  03/20/2008  Job:  [id number]

## 2010-07-27 NOTE — Op Note (Signed)
NAMEJOUD, PETTINATO             ACCOUNT NO.:  [id number]   MEDICAL RECORD NO.:  [id number]          PATIENT TYPE:  INP   LOCATION:  2306                         FACILITY:  [facility]   PHYSICIAN:  Ines Bloomer, M.D. DATE OF BIRTH:  01/23/1966   DATE OF PROCEDURE:  DATE OF DISCHARGE:                               OPERATIVE REPORT   PREOPERATIVE DIAGNOSIS:  Status post left upper lobe trisegmentectomy  with atelectasis, left upper lobe secondary to retained secretions.   POSTOPERATIVE DIAGNOSIS:  Status post left upper lobe trisegmentectomy  with atelectasis, left upper lobe secondary to retained secretions.   OPERATION PERFORMED:  Video bronchoscopy.   SURGEON:  Ines Bloomer, MD   ANESTHESIA:  Versed 8 mg and 50 mcg of fentanyl with Cetacaine and  Xylocaine.   This patient had had a previous left upper lobe trisegmentectomy and had  evidence of collapse of his left upper lobe and was coughing very poorly  despite maximum pulmonary toilet.  White count is elevated to 17,000, so  we elected to do bronchoscopy.   After local anesthesia with Cetacaine and Xylocaine, the patient was  given 8 mg of Versed.  The bronchoscope was passed through the mouth and  into the trachea.  The right upper lobe, right middle lobe, and right  lower lobe orifices were normal.  On the left side, there were  secretions and the blood clot in the left upper lobe, and we irrigated  this out with normal saline until we had cleared the secretions and no  more clots could be seen in the left upper lobe and the left lower lobe.  The video bronchoscope was then removed, and the secretions were sent  for culture.      Ines Bloomer, M.D.  Electronically Signed     DPB/MEDQ  D:  03/30/2008  T:  03/30/2008  Job:  [id number]

## 2010-07-27 NOTE — Letter (Signed)
November 19, 2008   Lajuana Matte, MD  [phone] [address]  [place], [place] [address]   Re:  Theresa [name], Theresa [name]             DOB:  Oct 10, 1965   Dear Dr. Arbutus Ped:   The patient came for follow up today and is doing well.  His blood  pressure was 142/90, pulse 68, respirations 18, sats were 98%.  Lungs  were clear to auscultation.  Her CT scan showed no evidence of  recurrence, both the CT and the PET were negative.  She has another one  scheduled in 6 months.  I will let you to continue to follow her and I  will be happy to see her again if she develops any other pulmonary  problems.   Sincerely,   Ines Bloomer, M.D.  Electronically Signed   DPB/MEDQ  D:  11/19/2008  T:  11/20/2008  Job:  [id number]

## 2010-07-27 NOTE — Op Note (Signed)
Theresa [name], Theresa [name]             ACCOUNT NO.:  [id number]   MEDICAL RECORD NO.:  [id number]          PATIENT TYPE:  INP   LOCATION:  2306                         FACILITY:  [facility]   PHYSICIAN:  Ines Bloomer, M.D. DATE OF BIRTH:  01-Mar-1966   DATE OF PROCEDURE:  DATE OF DISCHARGE:                               OPERATIVE REPORT   PREOPERATIVE DIAGNOSIS:  Left hydropneumothorax status post left  trisegmentectomy.   POSTOPERATIVE DIAGNOSIS:  Left hydropneumothorax status post left  trisegmentectomy.   OPERATION PERFORMED:  Insertion of left chest tube.   This patient had a previous left upper lobe trisegmentectomy and has  base that was hydropneumothorax, it was seen on chest CT scan and chest  x-ray.  The previous chest tube was nonfunctional, so we elected to  insert an anterior chest tube.   The patient turned to the left lateral thoracotomy position, and an area  was then prepped underneath the inferior portion of the left axilla and  infiltrated with 1% Xylocaine.  The patient was given 8 mg of Versed and  dissection was carried down through the subcutaneous tissues to the  fourth rib and a #28 trocar was inserted without difficulty into the  fifth and fourth intercostal space.  The patient had a small air leak  and drained some serous fluid.  She was placed in 20 cm suction.  The  patient tolerated the procedure well.      Ines Bloomer, M.D.  Electronically Signed     DPB/MEDQ  D:  04/01/2008  T:  04/01/2008  Job:  [id number]

## 2010-07-27 NOTE — Discharge Summary (Signed)
[name], [name]             ACCOUNT NO.:  [id number]   MEDICAL RECORD NO.:  [id number]          PATIENT TYPE:  INP   LOCATION:  3715                         FACILITY:  [facility]   PHYSICIAN:  Theresa [name], M.D.    DATE OF BIRTH:  August 18, 1965   DATE OF ADMISSION:  02/05/2008  DATE OF DISCHARGE:  02/07/2008                               DISCHARGE SUMMARY   PRIMARY CARE Shamica Moree:  Theresa Belling, MD at [facility].   CONSULTATION:  Cardiology.   DISCHARGE DIAGNOSES:  1. Chest pain, ruled out for myocardial infarction.  2. Diabetes mellitus, type 2.  3. Hypertension.  4. Hyperlipidemia.  5. New lung mass on CT.   STUDIES:  1. Chest x-ray, 2 view, on February 05, 2008, showed persistent      density in the left upper lobe.  2. CT angiogram of the chest on February 07, 2008, shows 2.2 x 2.5 cm      soft tissue mass in the posterior left apex.  No evidence of acute      pulmonary embolus.  3. Cardiac catheterization on February 06, 2008, showed normal left      ventricular systolic function, no significant coronary artery      disease.   LABORATORY DATA:  1. Hemoglobin A1c 6.5.  2. Lipid profile, total cholesterol 158, triglycerides 71, HDL 31, LDL      113.  3. Cardiac enzymes shows no indication of myocardial injury.  4. BMET on discharge shows sodium 138, potassium 3.2, chloride 103,      bicarbonate 26, glucose 105, BUN 8, creatinine 1.02.  5. CBC on discharge shows white blood count 8.9, hemoglobin 14.6,      hematocrit 42.4, platelets 201.   HOSPITAL COURSE:  This is a 45 year old African American female with  multiple medical problems including diabetes, hypertension, peptic ulcer  disease, asthma, hyperlipidemia who was admitted with chest pain with  typical chest pain and shortness of breath.  1. Chest pain:  The patient was admitted with typical chest pain and      strong family history of risk factors although the EKG was      unchanged and  point-of-care enzymes were negative.  The patient was      started on Plavix, heparin, and continued on beta-blocker at half      dose.  Cardiology was risk stratified her and ultimately decided on      cardiac catheterization.  Cardiac cath was normal showing no      coronary artery disease, and the patient was discharged the next      day.  2. Diabetes mellitus:  The patient was placed on sliding scale insulin      at home.  Metformin with no acute issues.  The patient was asked to      hold metformin for 2 days before restarting.  3. Hypertension:  The patient had elevated blood pressure on admission      but was bradycardic, so metoprolol was decreased from her home      dose.  The patient was asked to hold Diovan HCT  for 2 days before      restarting.  4. Hyperlipidemia:  The patient's lipids were checked, which showed      cholesterol slightly above goal.  No changes in medications were      made.  5. Lung mass on CT:  The patient was scheduled for outpatient PET scan      at [facility] for further evaluation of lung nodules, seen on CT      angiogram.  The patient scheduled for PET on February 12, 2008, at      11:30 a.m. and will follow up results with her primary care      physician.   DISCHARGE MEDICATIONS:  1. Metoprolol 100 mg p.o. b.i.d.  2. Caduet 10/10 once daily.  3. Zyrtec 10 mg once daily.  4. Protonix 40 mg daily.  5. Aspirin 325 mg daily.  6. Diovan HCT 160/12.5, held for 2 days, then resume.  7. Albuterol 1-2 puffs every 4 hours as needed.  8. Metformin 500 mg b.i.d., held for 2 days, then asked to resume.   FOLLOWUP APPOINTMENTS:  Dr. Constance [name] at [facility] to follow up  with the patient after a PET scan.   DISCHARGE CONDITION:  The patient was discharged to home in stable  condition.      Theresa Harness, MD  Electronically Signed      Theresa [name], M.D.  Electronically Signed    KB/MEDQ  D:  02/08/2008  T:  02/09/2008  Job:  [id number]    cc:   Theresa Belling, MD

## 2010-07-27 NOTE — Letter (Signed)
April 15, 2008   Barbaraann Share, MD, [facility]  [address] [place], [place] [address]   Re:  Theresa [name], Theresa [name]             DOB:  Jun 28, 1965   Dear Mellody Dance:   I saw the patient back today.  We had a very long postoperative period,  but she is doing better now.  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  As mentioned, she had  a prolonged postoperative course.  She comes in today.  We removed her  chest tube sutures.  Her chest x-ray showed normal postoperative  changes.  Her blood pressure is 136/90, pulse 85, respirations 18, and  sats are 98%.  The patient is feeling much better.  She gradually  increase her activities and we would see her back again in 2 weeks with  another chest x-ray.   Ines Bloomer, M.D.  Electronically Signed   DPB/MEDQ  D:  04/15/2008  T:  04/16/2008  Job:  [id number]

## 2010-07-27 NOTE — Assessment & Plan Note (Signed)
OFFICE VISIT   SHENOA, Theresa [name]  DOB:  1965-08-05                                        April 29, 2008  CHART #:  [id number]   The patient returns today.  Her blood pressure is 128/84, pulse 89,  respirations 18, and sats are 95%.  Lungs are clear to auscultation and  percussion.  Heart regular sinus rhythm.  No murmurs.  Her incisions are  well healed.  Her chest x-ray still shows postsurgical changes in the  left apex, but she is doing well overall.  I will release her return to  work next week and we will also get her an appointment to see Dr.  Arbutus Ped for followup.  I will see her back again in 4 weeks with a chest  x-ray.   Ines Bloomer, M.D.  Electronically Signed   DPB/MEDQ  D:  04/29/2008  T:  04/30/2008  Job:  [id number]   cc:   Oretha Milch, MD  Lajuana Matte, MD

## 2010-07-27 NOTE — Cardiovascular Report (Signed)
NAMEYOSHIYE, KRAFT             ACCOUNT NO.:  [id number]   MEDICAL RECORD NO.:  [id number]          PATIENT TYPE:  INP   LOCATION:  3715                         FACILITY:  [facility]   PHYSICIAN:  Thereasa Solo. Little, M.D. DATE OF BIRTH:  June 12, 1965   DATE OF PROCEDURE:  02/06/2008  DATE OF DISCHARGE:                            CARDIAC CATHETERIZATION   INDICATION FOR TEST:  A 45 year old female was admitted with chest pain  that had a pleuritic component to it and shortness of breath.  She has  multiple cardiac risk factors including a mother who had heart disease  in her late 27s, hypertension, diabetes, and hyperlipidemia.   The patient was brought to the cath lab for cardiac catheterization.  After obtaining informed consent, she was prepped and draped in the  usual fashion exposing the right groin.  Following local anesthetic with  1% Xylocaine, the Seldinger technique was employed and a 5-French  introducer sheath was placed in the right femoral artery.  Left and  right coronary arteriography, ventriculography, and a distal aortogram  was performed.   COMPLICATIONS:  None.   TOTAL CONTRAST:  85 mL.   EQUIPMENT:  5-French diagnostic Judkins configuration catheters.   RESULTS:  1. Hemodynamic monitoring:  Central aortic pressure was 142/84.  Left      ventricular pressure was 144/10 with no significant gradient across      the aortic valve at the time of pullback.  2. Ventriculography done after the coronary angiograms showed normal      LV systolic function.  Ejection fraction of 55%.  No mitral      regurgitation.  3. Distal aortogram:  A distal aortogram done at the level of renal      artery showed no evidence of renal artery stenosis, no abdominal      aortic aneurysm, and no evidence of proximal iliac disease.  4. Coronary arteriography:  On fluoroscopy, no calcification was seen.      a.     Left main was normal and bifurcated.      b.     Circumflex.  The circumflex in  the AV groove was normal.  It       gave rise to a very large OM vessel that was free of disease also.      c.     LAD.  The LAD extended down to the apex of the heart but did       not cross the apex.  There was mild 20% irregularities in the       midportion of the LAD.  There was a large first diagonal branch       that bifurcated and this system was free of disease.      d.     Right coronary artery.  There was a large PDA and       posterolateral vessel.  This entire right system was free of       disease.   CONCLUSION:  1. Normal LV systolic function.  2. Minimal irregularities in the LAD, but basically no significant      coronary  artery disease.  3. No renal artery stenosis or abdominal aortic aneurysm.   DISCUSSION:  I cannot explain her pain and shortness of breath based on  the cath results.  She does have an abnormal chest x-ray which shows a  left upper lobe density and she will need a CT of her chest for  completeness.  I would recommend doing this with contrast to rule out  pulmonary embolus since her pain was slightly pleuritic in nature and  she does have a tender left cath, although her D-dimer is negative.           ______________________________  Thereasa Solo. Little, M.D.     ABL/MEDQ  D:  02/06/2008  T:  02/07/2008  Job:  [id number]   cc:   Antonieta Iba, MD  Romero Belling, MD

## 2010-07-27 NOTE — Letter (Signed)
March 05, 2008   Barbaraann Share, MD, [facility]  [address] [place], [place] [address]   Re:  Theresa [name], Theresa [name]             DOB:  1966/01/15   Dear Mellody Dance,   I appreciate the opportunity of seeing the patient.  This 45 year old  African American female has a long history of smoking, quit smoking in  September 2009 and was found to have a left upper lobe mass.  She  underwent a CT scan, which showed a 3 cm left upper lobe mass.  She had  a PET scan, which was positive in this area and then underwent a needle  biopsy, which showed adenocarcinoma in this area.  These all which were  compatible with primary bronchogenic lung cancer.  She has had no  hemoptysis, fever, chills, excessive sputum, or weight loss.   ALLERGIES:  She is allergic to codeine and penicillin.  She is also  allergic to contrast dye and morphine causes nausea.   MEDICATIONS:  Metformin 500 mg twice a day, Toprol 100 mg twice a day,  lisinopril 40 mg twice a day, Caduet 40 mg daily, and Protonix 40 mg  daily.  She has a cream.   OTHER MEDICAL PROBLEMS:  Diabetes mellitus type 2.  She has a chronic  eczema, hypertension, dyslipidemia, and allergic rhinitis.  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  She also has  obesity, irritable bowel syndrome, chronic headaches, onychomycosis, and  osteoarthritis.   FAMILY HISTORY:  Positive for vascular disease.   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  Quit smoking in September 2009 and occasional alcohol  intake.   REVIEW OF SYSTEMS:  Vital Signs:  She is 223 pounds.  She is 5 feet 2  inches.  She has had some mild weight loss.  She has no angina, but she  has some shortness of breath.  Pulmonary:  She has bronchitis and has  wheezing.  No hemoptysis.  GI:  Reflux, peptic ulcer disease, diarrhea,  or constipation.  GU:  Frequent urination.  No kidney disease.  Vascular:  Pain in her legs with  walking.  Neurological:  She has got  headaches and migraine headaches.  Musculoskeletal:  Arthritis.  Psychiatric:  Depression.  Eye/ENT:  Glaucoma and retinitis pigmentosa.  Hearing is fine.  Hematological:  No problems with bleeding, clotting  disorders, or anemia.   PHYSICAL EXAMINATION:  GENERAL:  She is an obese [place]  female, in no acute distress.  VITAL SIGNS:  Her blood pressure is 152/100, pulse 68, respirations 18,  and sats were 96%.  Her pulmonary function tests showed an FVC of 2.54  with an FEV-1 of 2.02 with a diffusion capacity corrected of 112%.  HEAD, EYES, EARS, NOSE, AND THROAT:  Unremarkable except for decreased  eyesight.  NECK:  Supple without thyromegaly.  There is no supraclavicular or  axillary adenopathy.  HEART:  Regular sinus rhythm.  No murmurs.  CHEST:  Clear to auscultation and percussion.  ABDOMEN:  Obese.  There is no hepatosplenomegaly.  EXTREMITIES:  Pulses are 2+.  There is no clubbing or edema.  NEUROLOGICAL:  She is oriented x3.  Sensory and motor intact.  Cranial  nerves intact.   IMPRESSION:  I have discussed the needle biopsy findings and the PET  scan findings with her and I have recommended she have a left upper  lobectomy at [facility].  Because of her obesity, she will probably  require a full thoracotomy with the node dissection at the same time.  I  have discussed all the risks and benefits of the procedure and she  agrees with the procedure.  We will do this on March 25, 2007.  I  appreciate the opportunity of seeing the patient.   Sincerely.   Ines Bloomer, M.D.  Electronically Signed   DPB/MEDQ  D:  03/05/2008  T:  03/05/2008  Job:  [id number]

## 2010-07-27 NOTE — Discharge Summary (Signed)
NAMESHARNE, LINDERS             ACCOUNT NO.:  [id number]   MEDICAL RECORD NO.:  [id number]          PATIENT TYPE:  INP   LOCATION:  2010                         FACILITY:  [facility]   PHYSICIAN:  Ines Bloomer, M.D. DATE OF BIRTH:  1965/11/02   DATE OF ADMISSION:  03/24/2008  DATE OF DISCHARGE:  04/07/2008                               DISCHARGE SUMMARY   HISTORY:  The patient is a 45 year old black female former smoker who  quit in September 2009.  She was referred to Dr. Edwyna Shell for thoracic  surgical consultation.  Findings on chest x-ray were notable for a left  upper lobe lung mass and CT scan confirmed this finding of a 3 cm left  upper lobe mass.  PET scan was done and this was positive in the area  with needle biopsy subsequently revealing adenocarcinoma.  She was  admitted this hospitalization for resection.   ALLERGIES:  CODEINE, PENICILLIN, IV CONTRAST and MORPHINE.   MEDICATIONS PRIOR TO ADMISSION:  1. Metformin 500 mg twice daily.  2. Toprol 100 mg twice daily.  3. Lisinopril 40 mg daily.  4. Caduet 10/40 mg daily.  5. Protonix 40 mg daily.  6. Some type of topical cream.   PAST MEDICAL HISTORY:  1. Diabetes mellitus type 2.  2. Eczema.  3. Hypertension.  4. Dyslipidemia.  5. Allergic rhinitis.  6. 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  7. Obesity.  8. Irritable bowel syndrome.  9. Chronic headaches.  10.Onychomycosis.  11.Osteoarthritis.   FAMILY HISTORY:  Positive for vascular disease.   SOCIAL HISTORY:  She is single with four children.  She works as a  Neurosurgeon.  She stopped smoking in September 2009.  She does not drink  alcohol on a regular basis.   REVIEW OF SYMPTOMS AND PHYSICAL EXAMINATION:  Please see the history and  physical done at the time of admission.   HOSPITAL COURSE:  The patient was admitted electively and on March 24, 2008 taken to the operating room where she underwent left video-assisted  thoracoscopy with  left thoracotomy and left trisegmentectomy with lymph  node dissection.  She tolerated the procedure well and was taken to the  postanesthesia care unit in stable condition.   POSTOPERATIVE HOSPITAL COURSE:  The pathology has subsequently revealed  the left upper lobe trisegmentectomy specimen to be invasive  adenocarcinoma.  Lymph nodes were negative.  Postoperative course was  complicated by postoperative pulmonary infiltrates, atelectasis and  effusions.  She was treated in conjunction with the Critical Care  Medicine Service.  She did require a bronchoscopy on two occasions.  She  was treated with aggressive antibiotic management.  She did overall,  however had a gradual improvement in her progression and was deemed to  be acceptable for discharge on April 07, 2008.   MEDICATIONS ON DISCHARGE:  1. Chantix 1 mg twice daily.  2. Caduet 10/10 mg daily.  3. Protonix 40 mg daily.  4. Metoprolol 100 mg twice daily.  5. Metformin 500 mg twice daily.  6. Oxycodone 5 mg one to two every 4-6 hours as needed for pain.  7. Avelox 400 mg daily for four additional days.   INSTRUCTIONS:  The patient received written instructions in regard to  medications, activity, diet, wound care and followup.   FOLLOW UP:  With Dr. Edwyna Shell in 1 week post discharge with a chest x-ray.   FINAL DIAGNOSIS:  Adenocarcinoma as described above left upper lobe.   OTHER DIAGNOSES:  Postoperative pneumonia, postoperative atelectasis,  postoperative pleural effusions, history of tobacco abuse, history of  diabetes mellitus type 2, history of hypertension, history of  hyperlipidemia, history of allergic rhinitis, history of glaucoma,  history of retinitis pigmentosa, history of obesity, history of  irritable bowel syndrome and history of osteoarthritis.      Rowe Clack, P.A.-C.      Ines Bloomer, M.D.  Electronically Signed    WEG/MEDQ  D:  06/17/2008  T:  06/17/2008  Job:  [id number]

## 2010-07-27 NOTE — H&P (Signed)
Theresa [name], NEEDLE             ACCOUNT NO.:  [id number]   MEDICAL RECORD NO.:  [id number]          PATIENT TYPE:  OBV   LOCATION:  3715                         FACILITY:  [facility]   PHYSICIAN:  Ruthe Mannan, M.D.       DATE OF BIRTH:  Aug 04, 1965   DATE OF ADMISSION:  02/05/2008  DATE OF DISCHARGE:                              HISTORY & PHYSICAL   PRIMARY CARE Kacey Vicuna:  Romero Belling, MD   CHIEF COMPLAINT:  Chest pain.   HISTORY OF PRESENT ILLNESS:  This is a 45 year old with multiple medical  problems including diabetes, hypertension, peptic ulcer disease, asthma,  and hyperlipidemia who presents to the ED with complaint of multiple  episodes of chest pain and shortness of breath.  She has had at least 5  episodes per day for the past week.  It first occurred last Tuesday and  had each been similar in nature until today.  Prior episodes are  described as chest pain with shortness of breath, which occurred with  exertion and relieved by rest.  Chest pain substernal, radiates to the  left shoulder, and is not associated with nausea, vomiting, or  diaphoresis.  Shortness of breath occurs acutely with the chest pain and  it is not accompanied by wheezing or relieved by albuterol.  She denies  any cough, sputum production, fevers, or lower extremity edema.  She has  had 3-pillow orthopnea for the last several months.   Today, chest pain and shortness of breath started around 2 p.m. while  seated on her desk when a fire drill started.  It was worsened with  exertion.  Shortness of breath worsened and chest pain progressed to  squeezing substernal pressure of 8/10 in intensity and is not sure if it  is relieved by rest because she does not recall beyond this point being  in the ambulance.  Chest pain was relieved by nitroglycerin in the  ambulance.  Currently in the ED, she is having chest pain again, which  is relieved by nitroglycerin and rest.  No known history of CAD.  Multiple risk  factors including hypertension, hyperlipidemia, smoking  history, and strong family history with mom had 2 MIs in her 56s.  She  states she had normal exercise stress test 2 years ago with our  practice.   In the ED, the pain has resolved with nitroglycerin and aspirin too.  She continues to have shortness of breath.  EKG shows no acute changes.  Point-of-care enzymes are negative.  Chest x-ray showed peripheral  questionable mass on her left upper lobe.  D-dimer is 0.29.  White count  is 9.1, hemoglobin is 14.8, hematocrit 44.6, platelets are 235.  Hemoglobin A1c in March 2009 was 6.1.   REVIEW OF SYSTEMS:  See HPI.   PHYSICAL EXAMINATION:  VITAL SIGNS:  Temperature is 98.1, pulse is 48,  respiratory rate is 16, blood pressure is 143/72, oxygen saturation is  99% on room air.  GENERAL:  She is overweight appearing, in no acute distress, alert and  oriented x3.  LUNGS:  Normal respiratory effort.  Chest expansion symmetric.  Lungs  are clear to auscultation.  No crackles or wheezes.  HEART:  Regular rate and  rhythm.  No murmurs.  ABDOMEN:  Soft, nontender, nondistended.  No rebound.  No guarding.  EXTREMITIES:  No edema.   MEDICATIONS:  1. Diovan HCT 160/12.5 1 tab daily.  2. Metoprolol 100 mg 2 tablets b.i.d.  3. Zyrtec 10 mg 1 tab daily.  4. Caduet 10/10 mg 1 tab p.o. daily.  5. Metformin 500 mg b.i.d.  6. Protonix 40 mg p.o. daily.   PAST MEDICAL HISTORY:  She has a history of depression, obesity, low  back pain, disk herniation, migraine, type 2 diabetes diagnosed in  November 2008.   PAST SURGICAL HISTORY:  She had a BTO in 2002, hysterectomy in 2003.   FAMILY HISTORY:  Her father died of unknown causes.  Her mom did have 2  MIs in her 71s.   SOCIAL HISTORY:  She quit smoking in July 2009.  She is a former one and  a half pack per day smoker x3 years.  She is married, with 4 kids.  She  has occasional alcohol.   ASSESSMENT AND PLAN:  This is a 45 year old female  with,  1. Chest pain, which is typical in nature with very strong family      history and risk factors.  Although, EKG is unchanged and point-of-      care enzymes are negative, it appears that she is having acute      coronary syndrome and rule out for myocardial infarction.  Cycle      cardiac enzymes x3 q.8 hours apart, start asa, Plavix, heparin, and      continue her beta-blocker half dose as she is bradycardic and      consult Cardiology.  Placed on telemetry.  Ordered 2D echo and      further risk stratifies with FLP, TSH, and hemoglobin A1c.  2. Diabetes type 2, unchanged.  Normally controlled with last A1c in      March at 6.1, we placed her on sliding scale insulin and home      Metformin.  3. Hypertension, deteriorated, elevated, and would like to control BP      but, however, she is bradycardic so we will have to adjust the      metoprolol and continue other medications Diovan, metoprolol,      Caduet .  4. Hypercholesterolemia.  We will check an FLP in the morning with      slightly above goal last year with an LDL of 107 and HDL of 36.  5. Fluids, electrolytes, nutrition, GI, n.p.o. with some meds.  We      will  give her some fluids, normal saline at 125 mL per hour.  6. Prophylaxis on heparin for acute coronary syndrome, but we will      hold her H2 blocker and starting Plavix.      Ruthe Mannan, M.D.  Electronically Signed     TA/MEDQ  D:  02/05/2008  T:  02/06/2008  Job:  [id number]

## 2010-07-30 NOTE — Discharge Summary (Signed)
   NAME:  Theresa [name], Theresa [name]                       ACCOUNT NO.:  [id number]   MEDICAL RECORD NO.:  [id number]                   PATIENT TYPE:  INP   LOCATION:  9309                                 FACILITY:  [facility]   PHYSICIAN:  Michelle L. Vincente Poli, M.D.            DATE OF BIRTH:  March 02, 1966   DATE OF ADMISSION:  09/19/2001  DATE OF DISCHARGE:  09/21/2001                                 DISCHARGE SUMMARY   ADMISSION DIAGNOSES:  Uterine fibroids and menorrhea.   DISCHARGE DIAGNOSES:  Uterine fibroids and menorrhea.   HOSPITAL COURSE:  The patient is a 45 year old G4, P4 with known uterine  fibroids who presents for a total abdominal hysterectomy.  She has a history  of hypertension which is well controlled on Procardia and Toprol.  On the  day of surgery patient underwent a TAH which was uncomplicated.  By  postoperative day 1 her hematocrit was 35.6.  She was doing very well by  postoperative day 2.  She was discharged home in good condition.  She was  advised to follow a regular diet.  She was advised to follow for no heavy  lifting, no driving for two weeks.  She will come into the office in one  week from the time of her surgery for removal of her staples given that she  had a vertical skin incision.  On the day of discharge she was tolerating a  regular diet.  She was ambulating and she had good pain control with oral  medication.  She was advised to call if she has any nausea, vomiting, severe  abdominal pain, temperature greater than 100.5.  She was sent home with  Demerol tablets 50 mg and ibuprofen 600 mg.                                               Michelle L. Vincente Poli, M.D.    [name]  D:  10/15/2001  T:  10/19/2001  Job:  [id number]

## 2010-07-30 NOTE — Op Note (Signed)
[facility]  Patient:    Theresa [name], Theresa [name] Visit Number: [id number] MRN: [id number]          Service Type: GYN Location: 9300 [id number] [id number] Attending Physician:  Marcelle Overlie Dictated by:   Marcelle Overlie, M.D. Proc. Date: 09/19/01 Admit Date:  09/19/2001 Discharge Date: 09/21/2001                             Operative Report  PREOPERATIVE DIAGNOSES:       1. Uterine fibroids.                               2. Menorrhagia.  POSTOPERATIVE DIAGNOSES:      1. Uterine fibroids.                               2. Menorrhagia.  OPERATION:                    Total abdominal hysterectomy.  SURGEON:                      Marcelle Overlie, M.D.  ASSISTANTWilley Blade, M.D.  ANESTHESIA:                   General.  ESTIMATED BLOOD LOSS:         200 cc.  DRAINS:                       Foley.  COMPLICATIONS:                None.  DESCRIPTION OF PROCEDURE:     The patient was taken to the operating room. She was intubated without difficulty, and the abdomen was prepped and draped in the usual sterile fashion.  A Foley catheter was inserted into the bladder. The scalpel was used to make a vertical incision in the area of her previous surgical scar.  This was carried down to the fascia with good hemostasis.  The fascia was scored in the midline and the incision was then extended superiorly and inferiorly.  There were no adhesions noted.  After entry into the peritoneal cavity, the incision was stretched.  A self-retaining retractor was placed into the abdominal cavity.  The large and small bowel were placed in the upper abdomen.  Attention was then turned to the pelvis where a myomatous uterus is visualized.  Ovaries appear normal.  There was a simple benign cyst on the left ovary which was drained.  We then proceeded with a total abdominal hysterectomy in the following fascia by grasping the triple pedicle on either side with the  Kelly clamps and subsequently transecting the round ligament using suture ligature using 0 Vicryl suture.  This was done on either side, and the anterior and posterior leaflets of the broad ligaments were incised down to the level of the internal os.  The bladder flap was developed quite easily anteriorly.  We then placed on the right side a curved Heaney clamp across the triple pedicle.  The pedicle was cut and suture ligated using 0 Vicryl suture and then further secured using free ties of 0 Vicryl suture. This is done  likewise in a similar fashion on the left side.  We then skeletonized the uterine arteries on both sides and then sequentially clamped using curved Heaney clamps at the level of the internal os.  The pedicles were cut and suture ligated using 0 Vicryl suture with careful attention to avoid the bladder and pushing the bladder well-below our clamp.  A series of straight Kocher clamps were used to place on either side of the cervix, and clamping the cardinal and uterosacral complexes.  This was done several times on each side.  Each pedicle was cut and suture ligated using 0 Vicryl suture.  Once we reached the level of the external os, the vagina was entered at this level.  Using scalpel, the specimen was removed using scissors, and the vaginal cuff was then closed by first incorporating each angled stitch to the cardinal complexes using 0 Vicryl suture and the remainder of the cuff was closed using figure-of-eights using 0 Vicryl suture.  Irrigation was performed.  All the pedicles were inspected and noted to be hemostatic.  The ureters were identified in their normal location.  They were also palpated and all instruments were removed from the abdominal cavity.  The fascia was closed using #0 PDS, two sutures starting at each end in a continuous running stitch using the Jones technique and meeting in the midline.  After inspection of the subcutaneous layer, the scar was  revised by undermining the scar tissue using electrocautery, and then the skin was closed with staples.  All sponge, lap, and instrument counts were correct x2.  The patient tolerated the procedure well and went to the recovery room in stable condition. Dictated by:   Marcelle Overlie, M.D. Attending Physician:  Marcelle Overlie DD:  09/19/01 TD:  09/22/01 Job: [id number]

## 2010-08-17 ENCOUNTER — Emergency Department (HOSPITAL_COMMUNITY)
Admission: EM | Admit: 2010-08-17 | Discharge: 2010-08-18 | Disposition: A | Discharge status: Home / Self Care | Payer: Medicare Other | Attending: Emergency Medicine | Admitting: Emergency Medicine

## 2010-08-17 DIAGNOSIS — I10 Essential (primary) hypertension: Secondary | ICD-10-CM | POA: Insufficient documentation

## 2010-08-17 DIAGNOSIS — R42 Dizziness and giddiness: Secondary | ICD-10-CM | POA: Insufficient documentation

## 2010-08-17 DIAGNOSIS — M79609 Pain in unspecified limb: Secondary | ICD-10-CM | POA: Insufficient documentation

## 2010-08-17 DIAGNOSIS — R51 Headache: Secondary | ICD-10-CM | POA: Insufficient documentation

## 2010-08-17 DIAGNOSIS — Z85118 Personal history of other malignant neoplasm of bronchus and lung: Secondary | ICD-10-CM | POA: Insufficient documentation

## 2010-08-17 DIAGNOSIS — R079 Chest pain, unspecified: Secondary | ICD-10-CM | POA: Insufficient documentation

## 2010-08-17 DIAGNOSIS — M7989 Other specified soft tissue disorders: Secondary | ICD-10-CM | POA: Insufficient documentation

## 2010-08-18 ENCOUNTER — Encounter (HOSPITAL_COMMUNITY): Payer: Self-pay

## 2010-08-18 ENCOUNTER — Emergency Department (HOSPITAL_COMMUNITY): Payer: Medicare Other

## 2010-08-18 ENCOUNTER — Ambulatory Visit (HOSPITAL_COMMUNITY)
Admission: RE | Admit: 2010-08-18 | Discharge: 2010-08-18 | Disposition: A | Discharge status: Home / Self Care | Payer: Medicare Other | Source: Ambulatory Visit | Attending: Emergency Medicine | Admitting: Emergency Medicine

## 2010-08-18 DIAGNOSIS — M79609 Pain in unspecified limb: Secondary | ICD-10-CM | POA: Insufficient documentation

## 2010-08-18 DIAGNOSIS — M7989 Other specified soft tissue disorders: Secondary | ICD-10-CM | POA: Insufficient documentation

## 2010-08-18 LAB — CBC
HCT: 49 % — ABNORMAL HIGH (ref 36.0–46.0)
Hemoglobin: 16.5 g/dL — ABNORMAL HIGH (ref 12.0–15.0)
MCH: 29.9 pg (ref 26.0–34.0)
MCHC: 33.7 g/dL (ref 30.0–36.0)
MCV: 88.9 fL (ref 78.0–100.0)
Platelets: 216 10*3/uL (ref 150–400)
RBC: 5.51 MIL/uL — ABNORMAL HIGH (ref 3.87–5.11)
RDW: 13.3 % (ref 11.5–15.5)
WBC: 11.3 10*3/uL — ABNORMAL HIGH (ref 4.0–10.5)

## 2010-08-18 LAB — CK TOTAL AND CKMB (NOT AT ARMC)
CK, MB: 1.8 ng/mL (ref 0.3–4.0)
Relative Index: 1 (ref 0.0–2.5)
Total CK: 182 U/L — ABNORMAL HIGH (ref 7–177)

## 2010-08-18 LAB — COMPREHENSIVE METABOLIC PANEL
ALT: 15 U/L (ref 0–35)
AST: 17 U/L (ref 0–37)
Albumin: 4.1 g/dL (ref 3.5–5.2)
Alkaline Phosphatase: 47 U/L (ref 39–117)
BUN: 12 mg/dL (ref 6–23)
CO2: 29 mEq/L (ref 19–32)
Calcium: 9.8 mg/dL (ref 8.4–10.5)
Chloride: 98 mEq/L (ref 96–112)
Creatinine, Ser: 1.31 mg/dL — ABNORMAL HIGH (ref 0.4–1.2)
GFR calc Af Amer: 53 mL/min — ABNORMAL LOW (ref >60)
GFR calc non Af Amer: 44 mL/min — ABNORMAL LOW (ref >60)
Glucose, Bld: 89 mg/dL (ref 70–99)
Potassium: 3.3 mEq/L — ABNORMAL LOW (ref 3.5–5.1)
Sodium: 137 mEq/L (ref 135–145)
Total Bilirubin: 0.5 mg/dL (ref 0.3–1.2)
Total Protein: 7.7 g/dL (ref 6.0–8.3)

## 2010-08-18 LAB — TROPONIN I: Troponin I: <0.3 ng/mL (ref <0.30)

## 2010-08-27 IMAGING — CR DG CHEST 1V
1 series · 1 of 1 positions shown · non-contrast
Comparison: 02/05/2008 study.

CLINICAL DATA: History given of biopsy of left upper lobe lung
mass.

CHEST - 1 VIEW

[view not recorded]
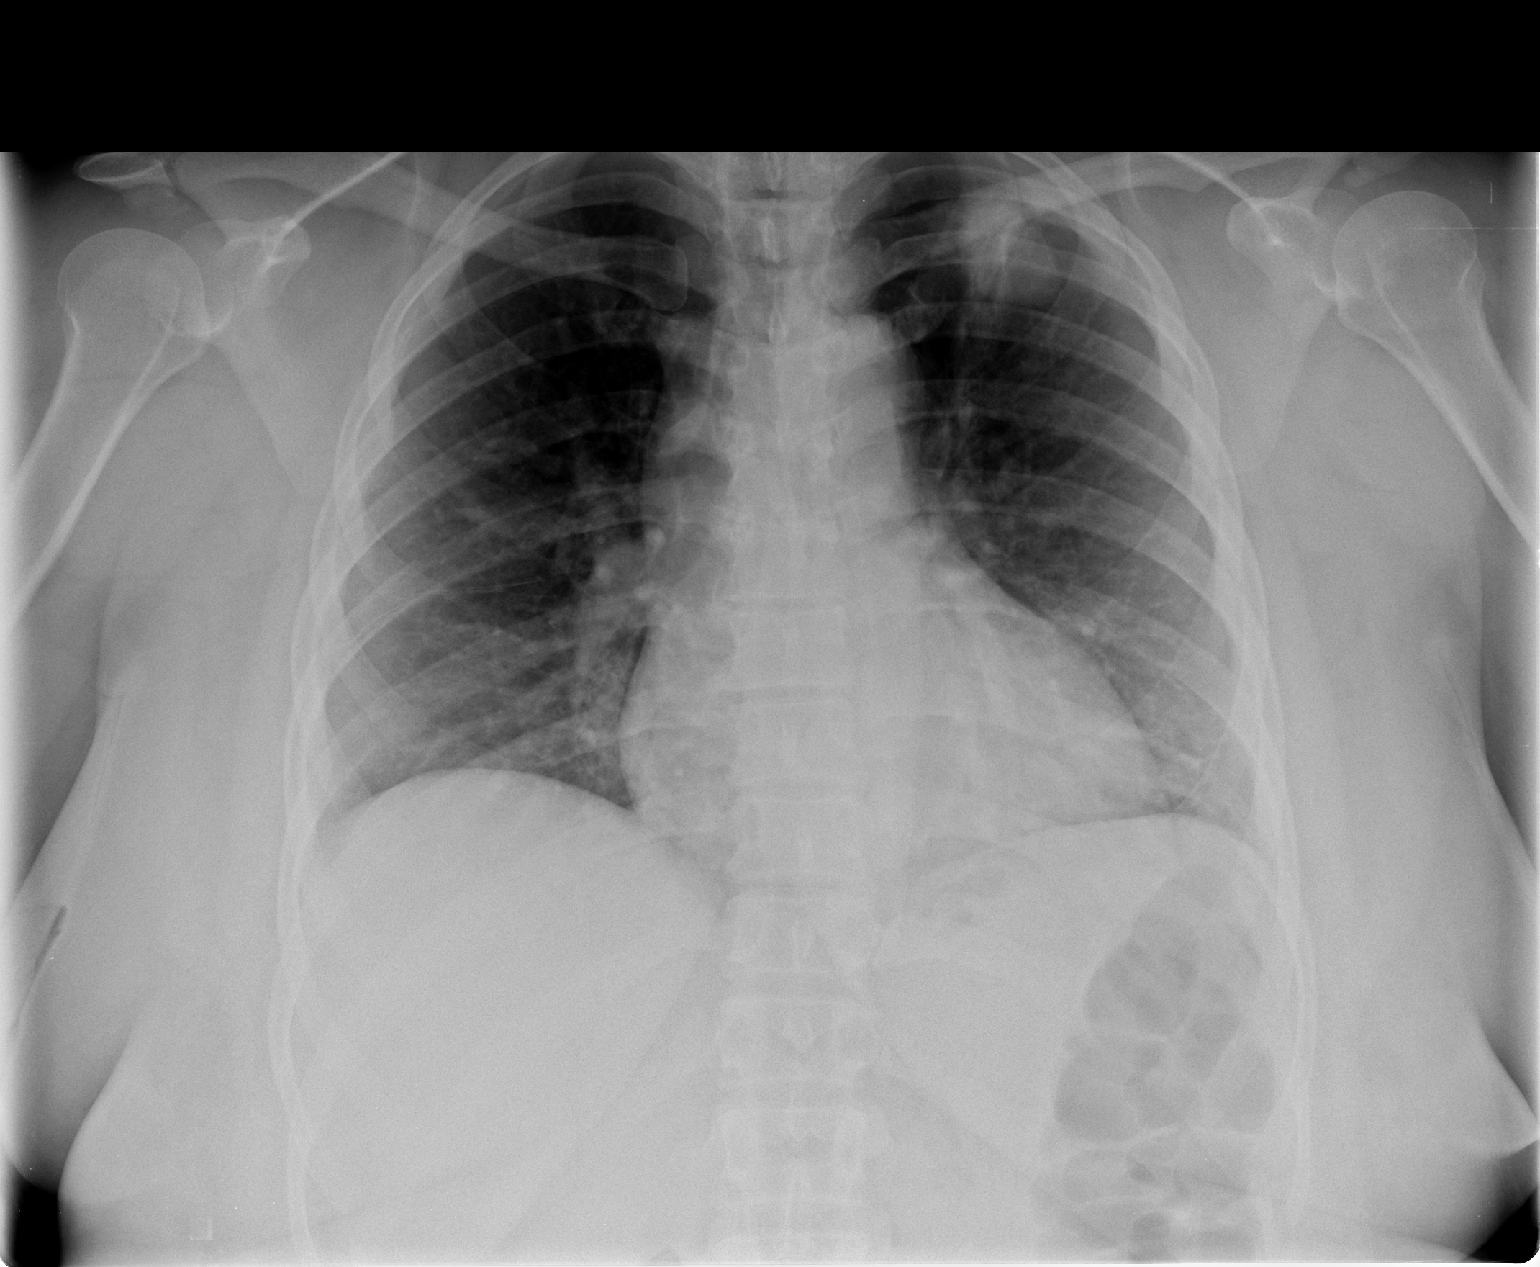

[1 of 1 positions shown; findings below may reference images not displayed]

FINDINGS: A mass density is seen in the left upper lobe.  It
measures approximately 3.0 x 2.6 cm.  History is given that the
left pulmonary mass has been biopsied.  No postbiopsy pneumothorax
is evident.  No pleural effusion is seen.  Cardiac silhouette is
borderline in size.  Bones appear average for age.
IMPRESSION: There is no evidence of pneumothorax after biopsy of the left upper
lobe mass.

## 2010-09-29 ENCOUNTER — Ambulatory Visit (INDEPENDENT_AMBULATORY_CARE_PROVIDER_SITE_OTHER): Payer: Medicare Other | Admitting: Family Medicine

## 2010-09-29 ENCOUNTER — Encounter: Payer: Self-pay | Admitting: Family Medicine

## 2010-09-29 DIAGNOSIS — M549 Dorsalgia, unspecified: Secondary | ICD-10-CM

## 2010-09-29 DIAGNOSIS — IMO0002 Reserved for concepts with insufficient information to code with codable children: Secondary | ICD-10-CM

## 2010-09-29 DIAGNOSIS — F339 Major depressive disorder, recurrent, unspecified: Secondary | ICD-10-CM

## 2010-09-29 DIAGNOSIS — M792 Neuralgia and neuritis, unspecified: Secondary | ICD-10-CM | POA: Insufficient documentation

## 2010-09-29 MED ORDER — DULOXETINE HCL 60 MG PO CPEP: 60.0000 mg | ORAL_CAPSULE | Freq: Every day | ORAL | Status: DC

## 2010-09-29 MED ORDER — PREGABALIN 50 MG PO CAPS: 50.0000 mg | ORAL_CAPSULE | Freq: Three times a day (TID) | ORAL | Status: DC

## 2010-09-30 ENCOUNTER — Encounter: Payer: Self-pay | Admitting: Family Medicine

## 2010-09-30 ENCOUNTER — Encounter: Payer: Self-pay | Admitting: *Deleted

## 2010-09-30 ENCOUNTER — Telehealth: Payer: Self-pay | Admitting: Family Medicine

## 2010-09-30 NOTE — Telephone Encounter (Signed)
Called pt for follow up of yesterday's visit- she had to leave quickly because her transportation was leaving.   Left a message saying:  I would write letter for work excuse.   Also, that she is to take Cymbalta, not Celexa, And she is to take lyrica, not gabapentin.  I have ordered referrals for PT and psychology It is ok to take tylenol (2 extra strength three times a day) for her pain.

## 2010-09-30 NOTE — Assessment & Plan Note (Signed)
Will try Cymbalta in setting of chronic pain and depression, need to titrate to appropriate dose.  Also, will refer to psychology for cognitive behavioral therapy.

## 2010-09-30 NOTE — Telephone Encounter (Signed)
Requesting letter stating she was here yesterday, call when ready.

## 2010-09-30 NOTE — Progress Notes (Signed)
  Subjective:    Patient ID: Theresa [name], female    DOB: [birth year], 45 y.o.   MRN: [id number]  HPI  Pt presents for follow up of her Depression, as well as back pain and leg pain.  She says she has been extremely irritable lately.  She can be feeling OK but quickly snaps at her husband or at her co-workers.  She is under a lot of stress at work and is having to work a lot of hours.  She says most day her mood is bad, guilty feelings for getting into arguments frequently, and is having difficulty sleeping, but otherwise she denies anhedonia, changes in eating habits or weight, and denies SI/HI.  She feels like her marriage is strained.  She was started on Celexa, but does not think it is working, so she stopped taking it.   Back pain- pt with lumbar and paraspinal pain.  This is a chronic problem.  She says if has hurt worse lately.  She is especially frustrated because she cannot move the way she wants to.    Leg pain- pt says she has pain in her lower legs, describes it as burning.  She tried to take gabapentin but she can only take it at night time because it makes her so sleepy.    She is able to acknowledge that her mood lately is likely affecting her pain.   Review of Systems Negative except noted in HPI.     Objective:   Physical Exam BP 131/80  Pulse 80  Temp(Src) 98.3 F (36.8 C) (Oral)  Wt 231 lb (104.781 kg) General appearance: alert, cooperative and mildly obese Back: Pain in paraspinal and lumbar back, pain at left SI joint line.  +Muscle spasm in paraspinals.  ROM limited by pain Gait: pt walks with limp on left side Lower legs: Pt has no edema, some tingling pain with palpation. 2+ pulses.  Psych: depressed mood and affect appropriate for mood.  Insight and judgement intact.  Does not appear to be responding to internal stimuli. No SI/HI.        Assessment & Plan:

## 2010-09-30 NOTE — Assessment & Plan Note (Signed)
Will change to lyrica.  Discussed treatment options, pt is interested in Physical therapy, which will likely improve her pain and her functional status.  Will refer for PT.

## 2010-09-30 NOTE — Assessment & Plan Note (Signed)
Will d/c gabapentin and try lyrica.

## 2010-10-25 ENCOUNTER — Telehealth: Payer: Self-pay | Admitting: Family Medicine

## 2010-10-25 NOTE — Telephone Encounter (Signed)
Needs the MD to call [employer] [phone], so that she can get hr Mask and tube for her C PAP machine.  And please call her.

## 2010-10-25 NOTE — Telephone Encounter (Signed)
LMOVM for pt to call back for clarification.  I sent a letter to her for psychiatrist that she could call, and I am uncertain as to what she means by we need to call for a cpap machine. [employer], [name]

## 2010-10-25 NOTE — Telephone Encounter (Signed)
The Providers have lost the referrals for Therapy and the Psychiatrist.  Would like if copies could be mailed to her home.

## 2010-10-26 NOTE — Telephone Encounter (Signed)
PT called and left her a message on 7.24.12 but she never called them back.  Advised that when pt gave [place] a callback we would have her call them. Theresa [name], [name]

## 2010-10-28 ENCOUNTER — Telehealth: Payer: Self-pay | Admitting: Family Medicine

## 2010-10-28 NOTE — Telephone Encounter (Signed)
Theresa [name] is calling regarding her CPAC order, a replacement letter for psychologist and referral and appt for PT.  She has been waiting for the info on the [facility] and the PT referral since last appt.  She only need a face mask and the tubing for her CPAC machine, but cannot receive until physician send in order to do so.  You can reach them at [phone].

## 2010-10-28 NOTE — Telephone Encounter (Signed)
LMOVM once again about referrals.  She needs to contact PT as they are waiting on her return call from their message on 7.24.12, the psychologist numbers were sent to her in the mail and for the CPAP machine i need to know the name of the company. Bonnye Halle, [name]

## 2010-11-08 ENCOUNTER — Other Ambulatory Visit: Payer: Self-pay | Admitting: Internal Medicine

## 2010-11-08 ENCOUNTER — Encounter (HOSPITAL_BASED_OUTPATIENT_CLINIC_OR_DEPARTMENT_OTHER): Payer: Medicare Other | Admitting: Internal Medicine

## 2010-11-08 ENCOUNTER — Ambulatory Visit (HOSPITAL_COMMUNITY)
Admission: RE | Admit: 2010-11-08 | Discharge: 2010-11-08 | Disposition: A | Discharge status: Home / Self Care | Payer: Medicare Other | Source: Ambulatory Visit | Attending: Internal Medicine | Admitting: Internal Medicine

## 2010-11-08 DIAGNOSIS — R222 Localized swelling, mass and lump, trunk: Secondary | ICD-10-CM | POA: Insufficient documentation

## 2010-11-08 DIAGNOSIS — R599 Enlarged lymph nodes, unspecified: Secondary | ICD-10-CM | POA: Insufficient documentation

## 2010-11-08 DIAGNOSIS — C3410 Malignant neoplasm of upper lobe, unspecified bronchus or lung: Secondary | ICD-10-CM

## 2010-11-08 DIAGNOSIS — C3490 Malignant neoplasm of unspecified part of unspecified bronchus or lung: Secondary | ICD-10-CM

## 2010-11-08 LAB — CMP (CANCER CENTER ONLY)
ALT(SGPT): 25 U/L (ref 10–47)
AST: 22 U/L (ref 11–38)
Albumin: 3.9 g/dL (ref 3.3–5.5)
Alkaline Phosphatase: 59 U/L (ref 26–84)
BUN, Bld: 13 mg/dL (ref 7–22)
CO2: 29 mEq/L (ref 18–33)
Calcium: 9 mg/dL (ref 8.0–10.3)
Chloride: 97 mEq/L — ABNORMAL LOW (ref 98–108)
Creat: 1.2 mg/dl (ref 0.6–1.2)
Glucose, Bld: 133 mg/dL — ABNORMAL HIGH (ref 73–118)
Potassium: 4.2 mEq/L (ref 3.3–4.7)
Sodium: 139 mEq/L (ref 128–145)
Total Bilirubin: 1 mg/dl (ref 0.20–1.60)
Total Protein: 7.5 g/dL (ref 6.4–8.1)

## 2010-11-08 LAB — CBC WITH DIFFERENTIAL/PLATELET
BASO%: 0.5 % (ref 0.0–2.0)
Basophils Absolute: 0 10*3/uL (ref 0.0–0.1)
EOS%: 2.8 % (ref 0.0–7.0)
Eosinophils Absolute: 0.2 10*3/uL (ref 0.0–0.5)
HCT: 49.1 % — ABNORMAL HIGH (ref 34.8–46.6)
HGB: 16.6 g/dL — ABNORMAL HIGH (ref 11.6–15.9)
LYMPH%: 28.7 % (ref 14.0–49.7)
MCH: 30.5 pg (ref 25.1–34.0)
MCHC: 33.8 g/dL (ref 31.5–36.0)
MCV: 90.2 fL (ref 79.5–101.0)
MONO#: 0.5 10*3/uL (ref 0.1–0.9)
MONO%: 6 % (ref 0.0–14.0)
NEUT#: 5.4 10*3/uL (ref 1.5–6.5)
NEUT%: 62 % (ref 38.4–76.8)
Platelets: 221 10*3/uL (ref 145–400)
RBC: 5.45 10*6/uL (ref 3.70–5.45)
RDW: 14.5 % (ref 11.2–14.5)
WBC: 8.6 10*3/uL (ref 3.9–10.3)
lymph#: 2.5 10*3/uL (ref 0.9–3.3)

## 2010-11-08 MED ORDER — IOHEXOL 300 MG/ML  SOLN
80.0000 mL | Freq: Once | INTRAMUSCULAR | Status: AC | PRN
  Administered 2010-11-08: 80 mL via INTRAVENOUS

## 2010-11-10 ENCOUNTER — Encounter (HOSPITAL_BASED_OUTPATIENT_CLINIC_OR_DEPARTMENT_OTHER): Payer: Medicare Other | Admitting: Internal Medicine

## 2010-11-10 DIAGNOSIS — C3410 Malignant neoplasm of upper lobe, unspecified bronchus or lung: Secondary | ICD-10-CM

## 2010-11-11 ENCOUNTER — Other Ambulatory Visit: Payer: Self-pay | Admitting: Internal Medicine

## 2010-11-11 DIAGNOSIS — C3490 Malignant neoplasm of unspecified part of unspecified bronchus or lung: Secondary | ICD-10-CM

## 2010-11-11 DIAGNOSIS — C7931 Secondary malignant neoplasm of brain: Secondary | ICD-10-CM

## 2010-11-18 ENCOUNTER — Ambulatory Visit (HOSPITAL_COMMUNITY)
Admission: RE | Admit: 2010-11-18 | Discharge: 2010-11-18 | Disposition: A | Discharge status: Home / Self Care | Payer: Medicare Other | Source: Ambulatory Visit | Attending: Internal Medicine | Admitting: Internal Medicine

## 2010-11-18 DIAGNOSIS — R42 Dizziness and giddiness: Secondary | ICD-10-CM | POA: Insufficient documentation

## 2010-11-18 DIAGNOSIS — F3289 Other specified depressive episodes: Secondary | ICD-10-CM | POA: Insufficient documentation

## 2010-11-18 DIAGNOSIS — R0789 Other chest pain: Secondary | ICD-10-CM | POA: Insufficient documentation

## 2010-11-18 DIAGNOSIS — H0520 Unspecified exophthalmos: Secondary | ICD-10-CM | POA: Insufficient documentation

## 2010-11-18 DIAGNOSIS — F329 Major depressive disorder, single episode, unspecified: Secondary | ICD-10-CM | POA: Insufficient documentation

## 2010-11-18 DIAGNOSIS — C3490 Malignant neoplasm of unspecified part of unspecified bronchus or lung: Secondary | ICD-10-CM | POA: Insufficient documentation

## 2010-11-18 DIAGNOSIS — R0602 Shortness of breath: Secondary | ICD-10-CM | POA: Insufficient documentation

## 2010-11-18 DIAGNOSIS — R51 Headache: Secondary | ICD-10-CM | POA: Insufficient documentation

## 2010-11-18 DIAGNOSIS — R110 Nausea: Secondary | ICD-10-CM | POA: Insufficient documentation

## 2010-11-18 DIAGNOSIS — C7931 Secondary malignant neoplasm of brain: Secondary | ICD-10-CM

## 2010-11-18 MED ORDER — GADOBENATE DIMEGLUMINE 529 MG/ML IV SOLN
20.0000 mL | Freq: Once | INTRAVENOUS | Status: AC | PRN
  Administered 2010-11-18: 20 mL via INTRAVENOUS

## 2010-11-24 ENCOUNTER — Encounter: Payer: Medicare Other | Admitting: Internal Medicine

## 2010-11-24 ENCOUNTER — Encounter (HOSPITAL_COMMUNITY)
Admission: RE | Admit: 2010-11-24 | Discharge: 2010-11-24 | Disposition: A | Discharge status: Home / Self Care | Payer: Medicare Other | Source: Ambulatory Visit | Attending: Internal Medicine | Admitting: Internal Medicine

## 2010-11-24 DIAGNOSIS — C3490 Malignant neoplasm of unspecified part of unspecified bronchus or lung: Secondary | ICD-10-CM | POA: Insufficient documentation

## 2010-11-24 LAB — GLUCOSE, CAPILLARY: Glucose-Capillary: 128 mg/dL — ABNORMAL HIGH (ref 70–99)

## 2010-11-24 MED ORDER — FLUDEOXYGLUCOSE F - 18 (FDG) INJECTION
18.4000 | Freq: Once | INTRAVENOUS | Status: AC | PRN
  Administered 2010-11-24: 18.4 via INTRAVENOUS

## 2010-11-29 ENCOUNTER — Encounter (HOSPITAL_BASED_OUTPATIENT_CLINIC_OR_DEPARTMENT_OTHER): Payer: Medicare Other | Admitting: Internal Medicine

## 2010-11-29 DIAGNOSIS — C3410 Malignant neoplasm of upper lobe, unspecified bronchus or lung: Secondary | ICD-10-CM

## 2010-11-29 DIAGNOSIS — R918 Other nonspecific abnormal finding of lung field: Secondary | ICD-10-CM

## 2010-12-02 ENCOUNTER — Other Ambulatory Visit: Payer: Self-pay | Admitting: Thoracic Surgery

## 2010-12-02 DIAGNOSIS — D381 Neoplasm of uncertain behavior of trachea, bronchus and lung: Secondary | ICD-10-CM

## 2010-12-09 ENCOUNTER — Other Ambulatory Visit: Payer: Self-pay | Admitting: Interventional Radiology

## 2010-12-09 ENCOUNTER — Telehealth: Payer: Self-pay | Admitting: Family Medicine

## 2010-12-09 ENCOUNTER — Ambulatory Visit (HOSPITAL_COMMUNITY)
Admission: RE | Admit: 2010-12-09 | Discharge: 2010-12-09 | Disposition: A | Discharge status: Home / Self Care | Payer: Medicare Other | Source: Ambulatory Visit | Attending: Thoracic Surgery | Admitting: Thoracic Surgery

## 2010-12-09 DIAGNOSIS — Y9210 Unspecified residential institution as the place of occurrence of the external cause: Secondary | ICD-10-CM | POA: Insufficient documentation

## 2010-12-09 DIAGNOSIS — Y849 Medical procedure, unspecified as the cause of abnormal reaction of the patient, or of later complication, without mention of misadventure at the time of the procedure: Secondary | ICD-10-CM | POA: Insufficient documentation

## 2010-12-09 DIAGNOSIS — I10 Essential (primary) hypertension: Secondary | ICD-10-CM | POA: Insufficient documentation

## 2010-12-09 DIAGNOSIS — R599 Enlarged lymph nodes, unspecified: Secondary | ICD-10-CM | POA: Insufficient documentation

## 2010-12-09 DIAGNOSIS — IMO0002 Reserved for concepts with insufficient information to code with codable children: Secondary | ICD-10-CM | POA: Insufficient documentation

## 2010-12-09 DIAGNOSIS — D381 Neoplasm of uncertain behavior of trachea, bronchus and lung: Secondary | ICD-10-CM

## 2010-12-09 DIAGNOSIS — Z85118 Personal history of other malignant neoplasm of bronchus and lung: Secondary | ICD-10-CM | POA: Insufficient documentation

## 2010-12-09 DIAGNOSIS — H409 Unspecified glaucoma: Secondary | ICD-10-CM | POA: Insufficient documentation

## 2010-12-09 DIAGNOSIS — R222 Localized swelling, mass and lump, trunk: Secondary | ICD-10-CM | POA: Insufficient documentation

## 2010-12-09 LAB — CBC
HCT: 46.6 % — ABNORMAL HIGH (ref 36.0–46.0)
Hemoglobin: 16.3 g/dL — ABNORMAL HIGH (ref 12.0–15.0)
MCH: 31.3 pg (ref 26.0–34.0)
MCHC: 35 g/dL (ref 30.0–36.0)
MCV: 89.4 fL (ref 78.0–100.0)
Platelets: 238 10*3/uL (ref 150–400)
RBC: 5.21 MIL/uL — ABNORMAL HIGH (ref 3.87–5.11)
RDW: 13.5 % (ref 11.5–15.5)
WBC: 9.7 10*3/uL (ref 4.0–10.5)

## 2010-12-09 LAB — PROTIME-INR
INR: 1 (ref 0.00–1.49)
Prothrombin Time: 13.4 seconds (ref 11.6–15.2)

## 2010-12-09 LAB — GLUCOSE, CAPILLARY: Glucose-Capillary: 111 mg/dL — ABNORMAL HIGH (ref 70–99)

## 2010-12-09 LAB — APTT: aPTT: 30 seconds (ref 24–37)

## 2010-12-09 NOTE — Telephone Encounter (Signed)
To MD.  See note from 10/25/10 for number to call [employer], [name]

## 2010-12-09 NOTE — Telephone Encounter (Signed)
Is calling back because she really needs a new mask and tube for her CPAP machine.  She said that she was told that her MD needs to do this.  Please give her a call.

## 2010-12-09 NOTE — Telephone Encounter (Signed)
Theresa [name] called about a month ago needing the MD to call for her to be approved for a new mask and tube for her CPAP machine.  She said she has called quite a bit about this but has not heard anything back.

## 2010-12-14 LAB — CBC
HCT: 42.3
HCT: 42.4
HCT: 44.6
Hemoglobin: 14.4
Hemoglobin: 14.6
Hemoglobin: 14.8
MCHC: 33.2
MCHC: 34.1
MCHC: 34.3
MCV: 88.7
MCV: 89
MCV: 90.4
Platelets: 201
Platelets: 228
Platelets: 235
RBC: 4.76
RBC: 4.78
RBC: 4.93
RDW: 13.8
RDW: 14.3
RDW: 14.3
WBC: 8.9
WBC: 9.1
WBC: 9.6

## 2010-12-14 LAB — POCT I-STAT, CHEM 8
BUN: 10
BUN: 8
Calcium, Ion: 1.14
Calcium, Ion: 1.17
Chloride: 105
Chloride: 107
Creatinine, Ser: 1.1
Creatinine, Ser: 1.2
Glucose, Bld: 133 — ABNORMAL HIGH
Glucose, Bld: 80
HCT: 42
HCT: 45
Hemoglobin: 14.3
Hemoglobin: 15.3 — ABNORMAL HIGH
Potassium: 3 — ABNORMAL LOW
Potassium: 3.6
Sodium: 142
Sodium: 142
TCO2: 23
TCO2: 24

## 2010-12-14 LAB — CARDIAC PANEL(CRET KIN+CKTOT+MB+TROPI)
CK, MB: 5.4 — ABNORMAL HIGH
CK, MB: 5.5 — ABNORMAL HIGH
CK, MB: 6 — ABNORMAL HIGH
Relative Index: 4.5 — ABNORMAL HIGH
Relative Index: 4.7 — ABNORMAL HIGH
Relative Index: 5.1 — ABNORMAL HIGH
Total CK: 106
Total CK: 121
Total CK: 129
Troponin I: <0.01
Troponin I: <0.01
Troponin I: <0.01

## 2010-12-14 LAB — BASIC METABOLIC PANEL
BUN: 10
BUN: 8
CO2: 25
CO2: 26
Calcium: 8.7
Calcium: 9
Chloride: 103
Chloride: 110
Creatinine, Ser: 1.02
Creatinine, Ser: 1.04
GFR calc Af Amer: >60
GFR calc Af Amer: >60
GFR calc non Af Amer: 58 — ABNORMAL LOW
GFR calc non Af Amer: 59 — ABNORMAL LOW
Glucose, Bld: 102 — ABNORMAL HIGH
Glucose, Bld: 105 — ABNORMAL HIGH
Potassium: 3.2 — ABNORMAL LOW
Potassium: 4.2
Sodium: 138
Sodium: 139

## 2010-12-14 LAB — LIPID PANEL
Cholesterol: 158
HDL: 31 — ABNORMAL LOW
LDL Cholesterol: 113 — ABNORMAL HIGH
Total CHOL/HDL Ratio: 5.1
Triglycerides: 71
VLDL: 14

## 2010-12-14 LAB — GLUCOSE, CAPILLARY
Glucose-Capillary: 105 — ABNORMAL HIGH
Glucose-Capillary: 118 — ABNORMAL HIGH
Glucose-Capillary: 147 — ABNORMAL HIGH
Glucose-Capillary: 148 — ABNORMAL HIGH
Glucose-Capillary: 206 — ABNORMAL HIGH
Glucose-Capillary: 94
Glucose-Capillary: 94
Glucose-Capillary: 99

## 2010-12-14 LAB — DIFFERENTIAL
Basophils Absolute: 0
Basophils Relative: 0
Eosinophils Absolute: 0.3
Eosinophils Relative: 3
Lymphocytes Relative: 23
Lymphs Abs: 2.1
Monocytes Absolute: 0.6
Monocytes Relative: 7
Neutro Abs: 6.1
Neutrophils Relative %: 67

## 2010-12-14 LAB — COMPREHENSIVE METABOLIC PANEL
ALT: 14
AST: 20
Albumin: 3.7
Alkaline Phosphatase: 39
BUN: 10
CO2: 24
Calcium: 8.4
Chloride: 104
Creatinine, Ser: 1.11
GFR calc Af Amer: >60
GFR calc non Af Amer: 53 — ABNORMAL LOW
Glucose, Bld: 91
Potassium: 3 — ABNORMAL LOW
Sodium: 136
Total Bilirubin: 1
Total Protein: 6.4

## 2010-12-14 LAB — HEMOGLOBIN A1C
Hgb A1c MFr Bld: 6.5 — ABNORMAL HIGH
Mean Plasma Glucose: 140

## 2010-12-14 LAB — PROTIME-INR
INR: 1
Prothrombin Time: 13.5

## 2010-12-14 LAB — POCT CARDIAC MARKERS
CKMB, poc: 4.1
Myoglobin, poc: 78.6
Troponin i, poc: <0.05

## 2010-12-14 LAB — APTT: aPTT: 35

## 2010-12-14 LAB — D-DIMER, QUANTITATIVE (NOT AT ARMC): D-Dimer, Quant: 0.29

## 2010-12-16 LAB — CBC
HCT: 46.1 % — ABNORMAL HIGH (ref 36.0–46.0)
Hemoglobin: 15.2 g/dL — ABNORMAL HIGH (ref 12.0–15.0)
MCHC: 33 g/dL (ref 30.0–36.0)
MCV: 89.8 fL (ref 78.0–100.0)
Platelets: 313 10*3/uL (ref 150–400)
RBC: 5.14 MIL/uL — ABNORMAL HIGH (ref 3.87–5.11)
RDW: 13.9 % (ref 11.5–15.5)
WBC: 9.6 10*3/uL (ref 4.0–10.5)

## 2010-12-16 LAB — GLUCOSE, CAPILLARY
Glucose-Capillary: 106 mg/dL — ABNORMAL HIGH (ref 70–99)
Glucose-Capillary: 164 mg/dL — ABNORMAL HIGH (ref 70–99)
Glucose-Capillary: 94 mg/dL (ref 70–99)
Glucose-Capillary: 108 mg/dL — ABNORMAL HIGH (ref 70–99)

## 2010-12-16 LAB — APTT: aPTT: 29 seconds (ref 24–37)

## 2010-12-16 LAB — PROTIME-INR
INR: 0.9 (ref 0.00–1.49)
Prothrombin Time: 12.6 seconds (ref 11.6–15.2)

## 2010-12-16 NOTE — Telephone Encounter (Signed)
Has this been completed?

## 2010-12-16 NOTE — Telephone Encounter (Signed)
Called the company, they just need an order faxed over for new face mask and tube.  Will print and place in fax.  Fax #:  [phone]

## 2010-12-21 ENCOUNTER — Encounter: Payer: Self-pay | Admitting: *Deleted

## 2010-12-21 DIAGNOSIS — L309 Dermatitis, unspecified: Secondary | ICD-10-CM | POA: Insufficient documentation

## 2010-12-21 DIAGNOSIS — E119 Type 2 diabetes mellitus without complications: Secondary | ICD-10-CM

## 2010-12-21 DIAGNOSIS — H3552 Pigmentary retinal dystrophy: Secondary | ICD-10-CM | POA: Insufficient documentation

## 2010-12-21 DIAGNOSIS — G8929 Other chronic pain: Secondary | ICD-10-CM | POA: Insufficient documentation

## 2010-12-21 DIAGNOSIS — B351 Tinea unguium: Secondary | ICD-10-CM | POA: Insufficient documentation

## 2010-12-22 ENCOUNTER — Ambulatory Visit (INDEPENDENT_AMBULATORY_CARE_PROVIDER_SITE_OTHER): Payer: Medicare Other | Admitting: Thoracic Surgery

## 2010-12-22 ENCOUNTER — Encounter: Payer: Self-pay | Admitting: Thoracic Surgery

## 2010-12-22 VITALS — BP 147/92 | HR 72 | Resp 16 | Ht 62.0 in | Wt 223.0 lb

## 2010-12-22 DIAGNOSIS — R599 Enlarged lymph nodes, unspecified: Secondary | ICD-10-CM

## 2010-12-22 DIAGNOSIS — R591 Generalized enlarged lymph nodes: Secondary | ICD-10-CM

## 2010-12-22 NOTE — Progress Notes (Signed)
HPI the patient returns today with her husband. A needle biopsy was done for a PET-positive aortopulmonary window node. Needle biopsy was nondiagnostic. In order to do an open biopsy, this would require repeat thoracotomy. Since this node had increased in size and is PET positive, I feel that she should be treated with radiation and/or chemotherapy. We will discuss this with Dr. Gwenyth Bouillon and Dr. Kathrynn Running at cancer conference. I will call her after the cancer conference.   Current Outpatient Prescriptions  Medication Sig Dispense Refill  . albuterol (VENTOLIN HFA) 108 (90 BASE) MCG/ACT inhaler Inhale 2 puffs into the lungs every 4 (four) hours as needed.        Marland Kitchen amLODipine (NORVASC) 10 MG tablet Take 1 tablet (10 mg total) by mouth daily.  90 tablet  3  . betamethasone dipropionate (DIPROLENE) 0.05 % ointment Apply topically 3 (three) times daily. Dispense 15 gram tube       . carvedilol (COREG) 25 MG tablet Take 1 tablet (25 mg total) by mouth 2 (two) times daily.  60 tablet  11  . DULoxetine (CYMBALTA) 60 MG capsule Take 1 capsule (60 mg total) by mouth daily. Take 1/2 tab po daily x 1 week, then take 1 tab daily  30 capsule  3  . estrogens, conjugated, (PREMARIN) 0.3 MG tablet Take 0.3 mg by mouth daily. Take daily for 21 days then do not take for 7 days.       . fluticasone (FLONASE) 50 MCG/ACT nasal spray 1 spray by Nasal route daily.        . hydrOXYzine (ATARAX) 25 MG tablet Take 25-50 mg by mouth every 6 (six) hours as needed. For itching       . pregabalin (LYRICA) 50 MG capsule Take 1 capsule (50 mg total) by mouth 3 (three) times daily.  90 capsule  2  . sucralfate (CARAFATE) 1 GM/10ML suspension Take 1 gram 4 times daily, between meals and at bedtime.       . valsartan-hydrochlorothiazide (DIOVAN-HCT) 160-12.5 MG per tablet Take 1 tablet by mouth daily.  90 tablet  3  . varenicline (CHANTIX) 0.5 MG tablet take one daily for 7 days then increase to two times a day with meals.       .  lidocaine (LIDODERM) 5 % apply one patch daily to area of greatest pain          Review of Systems:unchanged   Physical Exam  Cardiovascular: Normal rate, regular rhythm and normal heart sounds.   Pulmonary/Chest: Effort normal and breath sounds normal. No respiratory distress.     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

## 2011-01-05 ENCOUNTER — Ambulatory Visit
Admission: RE | Admit: 2011-01-05 | Discharge: 2011-01-05 | Disposition: A | Discharge status: Home / Self Care | Payer: Medicare Other | Source: Ambulatory Visit | Attending: Radiation Oncology | Admitting: Radiation Oncology

## 2011-01-05 DIAGNOSIS — Z79899 Other long term (current) drug therapy: Secondary | ICD-10-CM | POA: Insufficient documentation

## 2011-01-05 DIAGNOSIS — R05 Cough: Secondary | ICD-10-CM | POA: Insufficient documentation

## 2011-01-05 DIAGNOSIS — E119 Type 2 diabetes mellitus without complications: Secondary | ICD-10-CM | POA: Insufficient documentation

## 2011-01-05 DIAGNOSIS — C3490 Malignant neoplasm of unspecified part of unspecified bronchus or lung: Secondary | ICD-10-CM | POA: Insufficient documentation

## 2011-01-05 DIAGNOSIS — R0789 Other chest pain: Secondary | ICD-10-CM | POA: Insufficient documentation

## 2011-01-05 DIAGNOSIS — R059 Cough, unspecified: Secondary | ICD-10-CM | POA: Insufficient documentation

## 2011-01-05 DIAGNOSIS — I10 Essential (primary) hypertension: Secondary | ICD-10-CM | POA: Insufficient documentation

## 2011-01-05 DIAGNOSIS — Z510 Encounter for antineoplastic radiation therapy: Secondary | ICD-10-CM | POA: Insufficient documentation

## 2011-01-05 DIAGNOSIS — R5381 Other malaise: Secondary | ICD-10-CM | POA: Insufficient documentation

## 2011-01-05 DIAGNOSIS — R5383 Other fatigue: Secondary | ICD-10-CM | POA: Insufficient documentation

## 2011-01-17 ENCOUNTER — Ambulatory Visit
Admission: RE | Admit: 2011-01-17 | Discharge: 2011-01-17 | Disposition: A | Discharge status: Home / Self Care | Payer: Medicare Other | Source: Ambulatory Visit | Attending: Radiation Oncology | Admitting: Radiation Oncology

## 2011-01-17 DIAGNOSIS — Z85118 Personal history of other malignant neoplasm of bronchus and lung: Secondary | ICD-10-CM

## 2011-01-18 ENCOUNTER — Ambulatory Visit
Admission: RE | Admit: 2011-01-18 | Discharge: 2011-01-18 | Disposition: A | Discharge status: Home / Self Care | Payer: Medicare Other | Source: Ambulatory Visit | Attending: Radiation Oncology | Admitting: Radiation Oncology

## 2011-01-19 ENCOUNTER — Ambulatory Visit
Admission: RE | Admit: 2011-01-19 | Discharge: 2011-01-19 | Disposition: A | Discharge status: Home / Self Care | Payer: Medicare Other | Source: Ambulatory Visit | Attending: Radiation Oncology | Admitting: Radiation Oncology

## 2011-01-19 ENCOUNTER — Ambulatory Visit: Payer: Medicare Other | Admitting: Family Medicine

## 2011-01-20 ENCOUNTER — Ambulatory Visit
Admission: RE | Admit: 2011-01-20 | Discharge: 2011-01-20 | Disposition: A | Discharge status: Home / Self Care | Payer: Medicare Other | Source: Ambulatory Visit | Attending: Radiation Oncology | Admitting: Radiation Oncology

## 2011-01-20 DIAGNOSIS — C3492 Malignant neoplasm of unspecified part of left bronchus or lung: Secondary | ICD-10-CM | POA: Diagnosis present

## 2011-01-20 DIAGNOSIS — C3490 Malignant neoplasm of unspecified part of unspecified bronchus or lung: Secondary | ICD-10-CM

## 2011-01-20 NOTE — Progress Notes (Signed)
[facility] Radiation Oncology Weekly Treatment Note    Name: Theresa [name] Date: 01/20/2011 MRN: [id number] DOB: 11/25/1965  Status:outpatient    Current dose: 800  Current fraction:4  Planned dose:6600  Planned fraction:33   MEDICATIONS:Current outpatient prescriptions:albuterol (VENTOLIN HFA) 108 (90 BASE) MCG/ACT inhaler, Inhale 2 puffs into the lungs every 4 (four) hours as needed.  , Disp: , Rfl: ;  amLODipine (NORVASC) 10 MG tablet, Take 1 tablet (10 mg total) by mouth daily., Disp: 90 tablet, Rfl: 3;  betamethasone dipropionate (DIPROLENE) 0.05 % ointment, Apply topically 3 (three) times daily. Dispense 15 gram tube , Disp: , Rfl:  carvedilol (COREG) 25 MG tablet, Take 1 tablet (25 mg total) by mouth 2 (two) times daily., Disp: 60 tablet, Rfl: 11;  DULoxetine (CYMBALTA) 60 MG capsule, Take 1 capsule (60 mg total) by mouth daily. Take 1/2 tab po daily x 1 week, then take 1 tab daily, Disp: 30 capsule, Rfl: 3;  estrogens, conjugated, (PREMARIN) 0.3 MG tablet, Take 0.3 mg by mouth daily. Take daily for 21 days then do not take for 7 days. , Disp: , Rfl:  fluticasone (FLONASE) 50 MCG/ACT nasal spray, 1 spray by Nasal route daily.  , Disp: , Rfl: ;  hydrOXYzine (ATARAX) 25 MG tablet, Take 25-50 mg by mouth every 6 (six) hours as needed. For itching , Disp: , Rfl: ;  lidocaine (LIDODERM) 5 %, apply one patch daily to area of greatest pain , Disp: , Rfl: ;  pregabalin (LYRICA) 50 MG capsule, Take 1 capsule (50 mg total) by mouth 3 (three) times daily., Disp: 90 capsule, Rfl: 2 sucralfate (CARAFATE) 1 GM/10ML suspension, Take 1 gram 4 times daily, between meals and at bedtime. , Disp: , Rfl: ;  valsartan-hydrochlorothiazide (DIOVAN-HCT) 160-12.5 MG per tablet, Take 1 tablet by mouth daily., Disp: 90 tablet, Rfl: 3;  varenicline (CHANTIX) 0.5 MG tablet, take one daily for 7 days then increase to two times a day with meals. , Disp: , Rfl:    ALLERGIES: Morphine and related;  Codeine; and Penicillins    NARRATIVE: Theresa [name] was seen today for weekly treatment management. The chart was checked and port films images were reviewed. Pt has occasional feeling of food sticking. Occasional pain in chest - moderate.  PHYSICAL EXAMINATION: weight is 229 lb 8 oz (104.101 kg). Her blood pressure is 108/74 and her pulse is 65. Her respiration is 18 and oxygen saturation is 96%.      ASSESSMENT: Patient tolerating treatments well.    PLAN: Continue treatment as planned.

## 2011-01-20 NOTE — Progress Notes (Signed)
PATIENT PRESENTS TO THE CLINIC TODAY UNACCOMPANIED FOR UNDER TREAT VISIT WITH DR. MOODY. NO DISTRESS NOTED. STEADY GAIT NOTED. PLEASANT AFFECT NOTED. PATIENT REPORTS, "I FEEL LIKE I HAVE A BALL IN MY THROAT." PATIENT REPORTS INTERMITTENT CRAMPING OF HER LEFT CHEST 5 ON A SCALE OF 0-10 RELIEVED WITH HYDROCODONE. PATIENT REPORTS SOME FOODS AND DRINKS HAVE A METALLIC TASTE. PATIENT REPORTS USING RADIAPLEX AS PRESCRIBED.

## 2011-01-21 ENCOUNTER — Ambulatory Visit
Admission: RE | Admit: 2011-01-21 | Discharge: 2011-01-21 | Disposition: A | Discharge status: Home / Self Care | Payer: Medicare Other | Source: Ambulatory Visit | Attending: Radiation Oncology | Admitting: Radiation Oncology

## 2011-01-22 ENCOUNTER — Other Ambulatory Visit: Payer: Self-pay | Admitting: Internal Medicine

## 2011-01-22 DIAGNOSIS — C3490 Malignant neoplasm of unspecified part of unspecified bronchus or lung: Secondary | ICD-10-CM

## 2011-01-24 ENCOUNTER — Ambulatory Visit (INDEPENDENT_AMBULATORY_CARE_PROVIDER_SITE_OTHER): Payer: Medicare Other | Admitting: Family Medicine

## 2011-01-24 ENCOUNTER — Ambulatory Visit
Admission: RE | Admit: 2011-01-24 | Discharge: 2011-01-24 | Disposition: A | Discharge status: Home / Self Care | Payer: Medicare Other | Source: Ambulatory Visit | Attending: Radiation Oncology | Admitting: Radiation Oncology

## 2011-01-24 ENCOUNTER — Encounter: Payer: Self-pay | Admitting: *Deleted

## 2011-01-24 ENCOUNTER — Telehealth: Payer: Self-pay | Admitting: Internal Medicine

## 2011-01-24 ENCOUNTER — Encounter: Payer: Self-pay | Admitting: Family Medicine

## 2011-01-24 VITALS — BP 143/80 | HR 73 | Temp 98.4°F | Ht 62.0 in | Wt 227.4 lb

## 2011-01-24 DIAGNOSIS — G43909 Migraine, unspecified, not intractable, without status migrainosus: Secondary | ICD-10-CM | POA: Insufficient documentation

## 2011-01-24 DIAGNOSIS — J019 Acute sinusitis, unspecified: Secondary | ICD-10-CM

## 2011-01-24 DIAGNOSIS — F17200 Nicotine dependence, unspecified, uncomplicated: Secondary | ICD-10-CM

## 2011-01-24 DIAGNOSIS — G8929 Other chronic pain: Secondary | ICD-10-CM

## 2011-01-24 DIAGNOSIS — R51 Headache: Secondary | ICD-10-CM

## 2011-01-24 MED ORDER — PROMETHAZINE HCL 25 MG/ML IJ SOLN: 25.0000 mg | Freq: Once | INTRAMUSCULAR | Status: DC

## 2011-01-24 MED ORDER — VARENICLINE TARTRATE 0.5 MG PO TABS: 1.0000 mg | ORAL_TABLET | Freq: Two times a day (BID) | ORAL | Status: DC

## 2011-01-24 MED ORDER — KETOROLAC TROMETHAMINE 30 MG/ML IJ SOLN: 30.0000 mg | Freq: Once | INTRAMUSCULAR | Status: DC

## 2011-01-24 MED ORDER — PROPRANOLOL HCL 80 MG PO CP24: 80.0000 mg | ORAL_CAPSULE | Freq: Every day | ORAL | Status: DC

## 2011-01-24 MED ORDER — KETOROLAC TROMETHAMINE 30 MG/ML IJ SOLN
30.0000 mg | Freq: Once | INTRAMUSCULAR | Status: AC
  Administered 2011-01-24: 30 mg via INTRAMUSCULAR

## 2011-01-24 MED ORDER — DOXYCYCLINE HYCLATE 100 MG PO TABS: 100.0000 mg | ORAL_TABLET | Freq: Two times a day (BID) | ORAL | Status: AC

## 2011-01-24 MED ORDER — PROMETHAZINE HCL 25 MG/ML IJ SOLN
25.0000 mg | Freq: Once | INTRAMUSCULAR | Status: AC
  Administered 2011-01-24: 25 mg via INTRAMUSCULAR

## 2011-01-24 NOTE — Assessment & Plan Note (Signed)
Pt with PCN allergy, will treat with doxycycline x 10 days.

## 2011-01-24 NOTE — Progress Notes (Signed)
Short Term Disability Income Benefits Attending MD Statement given to [facility] in Medical Mgmt.   SLJ

## 2011-01-24 NOTE — Assessment & Plan Note (Signed)
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  Recent head imaging, so I am less concerned about a metastatic lesion to her brain as source.

## 2011-01-24 NOTE — Assessment & Plan Note (Signed)
Will increase to higher dose of chantix.  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

## 2011-01-24 NOTE — Patient Instructions (Addendum)
It was nice to see you.  I am sorry you are hurting so badly and not feeling good.  Please take all of these antibiotics.  Please make a nurse visit to get a flu shot in a week or two when you are feeling better.    I am going to start you on a medication to prevent migraines called Propranolol.  This is also a medication that can affect your blood pressure, and yours was slightly elevated today 143/80.  Please start taking this medication and see if your headaches get better.  Also, please do not take ibuprofen or tylenol every day as they can cause rebound headaches.   Please ask Dr. Jerolyn Center to send me records during your chemo treatment.

## 2011-01-24 NOTE — Assessment & Plan Note (Signed)
Toradol and Phenergan IM today in clinic to break headache.  Will start Propranolol for prophylaxis as pt has failed multiple treatments.

## 2011-01-24 NOTE — Progress Notes (Signed)
  Subjective:    Patient ID: Theresa [name], female    DOB: 1965-03-31, 45 y.o.   MRN: [id number]  HPI  Theresa [name] comes in complaining of a headache for 2 weeks. She says that she has had nausea sensitivity to light, and that the only thing that relieves her headache is sleeping. She has also had nasal congestion and sinus pain and pressure. She says it has been difficult to breathe through her nose. She also says she has had a low grade fever and chills.   The patient has been diagnosed with recurrent lung cancer. She says that the tumor is very close to her heart, so she is having chemotherapy and radiation, but not surgery at this point. She says that the chemotherapy makes her feel terrible.  The patient says that she used to be on a higher dose of Chantix that helped more, but right now she is taking 0.5 mg by mouth twice a day. She says she would like to be on a higher dose. She is very frustrated that she is still smoking despite her lung cancer.  The patient had a recent PET scan showing no tumors in her head, only showing the malignancy in her lung.  Review of Systems Pertinent history of present illness.    Objective:   Physical Exam BP 143/80  Pulse 73  Temp(Src) 98.4 F (36.9 C) (Oral)  Ht 5\' 2"  (1.575 m)  Wt 227 lb 6.4 oz (103.148 kg)  BMI 41.59 kg/m2 General appearance: alert, cooperative and uncomfortable. Head: Normocephalic, without obvious abnormality, atraumatic, sinuses tender to percussion Eyes: PERRL Ears: Fluid behind TM's, TM's dull, but no erythema or drainage.  Nose: moderate congestion, sinus tenderness bilateral Throat: lips, mucosa, and tongue normal; teeth and gums normal Neck: no adenopathy, supple, symmetrical, trachea midline and thyroid not enlarged, symmetric, no tenderness/mass/nodules Lungs: clear to auscultation bilaterally Heart: regular rate and rhythm, S1, S2 normal, no murmur, click, rub or gallop Neurologic: Cranial nerves: normal, except  vision (pt legally blind).       Assessment & Plan:

## 2011-01-24 NOTE — Telephone Encounter (Signed)
LMONVM FOR PT RE APPT FOR 11/19 @ 12PM. ALSO ADDED COMMENT TO 11/13 APPT TO SEND PT TO SCHEDULING FOR 11/19 APPT.

## 2011-01-25 ENCOUNTER — Ambulatory Visit
Admission: RE | Admit: 2011-01-25 | Discharge: 2011-01-25 | Disposition: A | Discharge status: Home / Self Care | Payer: Medicare Other | Source: Ambulatory Visit | Attending: Radiation Oncology | Admitting: Radiation Oncology

## 2011-01-26 ENCOUNTER — Other Ambulatory Visit: Payer: Self-pay | Admitting: *Deleted

## 2011-01-26 ENCOUNTER — Ambulatory Visit: Payer: Medicare Other

## 2011-01-26 DIAGNOSIS — C3490 Malignant neoplasm of unspecified part of unspecified bronchus or lung: Secondary | ICD-10-CM

## 2011-01-26 NOTE — Progress Notes (Signed)
Per Dr Donnald Garre, pt needs lab and MD visit within 1 week, Onx Tx Schedule form filled out, send to schedulers,  SLJ

## 2011-01-27 ENCOUNTER — Ambulatory Visit
Admission: RE | Admit: 2011-01-27 | Discharge: 2011-01-27 | Disposition: A | Discharge status: Home / Self Care | Payer: Medicare Other | Source: Ambulatory Visit | Attending: Radiation Oncology | Admitting: Radiation Oncology

## 2011-01-28 ENCOUNTER — Ambulatory Visit
Admission: RE | Admit: 2011-01-28 | Discharge: 2011-01-28 | Disposition: A | Discharge status: Home / Self Care | Payer: Medicare Other | Source: Ambulatory Visit | Attending: Radiation Oncology | Admitting: Radiation Oncology

## 2011-01-28 ENCOUNTER — Telehealth: Payer: Self-pay | Admitting: Internal Medicine

## 2011-01-28 DIAGNOSIS — C3490 Malignant neoplasm of unspecified part of unspecified bronchus or lung: Secondary | ICD-10-CM

## 2011-01-28 NOTE — Progress Notes (Signed)
Patient presents to the clinic today accompanied by her sister for under treat visit with Dr. Mitzi Hansen. No distress noted. Steady gait noted. Pleasant affect noted. Patient reports intermittent left upper chest and upper back pain 10 on a scale of 0-10. Patient requesting a prescription for pain medication.  Patient reports SOB. Persistent productive cough with red tinged sputum.

## 2011-01-28 NOTE — Progress Notes (Signed)
[facility] Radiation [facility]cology Weekly Treatment Note    Name: Theresa [name] Date: 01/28/2011 MRN: [id number] DOB: 03-Feb-1966  Status:outpatient    Current dose: 2000  Current fraction:10  Planned dose:6600  Planned fraction:33   MEDICATIONS:Current outpatient prescriptions:betamethasone dipropionate (DIPROLENE) 0.05 % ointment, Apply topically 3 (three) times daily. Dispense 15 gram tube , Disp: , Rfl: ;  carvedilol (COREG) 25 MG tablet, Take 1 tablet (25 mg total) by mouth 2 (two) times daily., Disp: 60 tablet, Rfl: 11;  doxycycline (VIBRA-TABS) 100 MG tablet, Take 1 tablet (100 mg total) by mouth 2 (two) times daily., Disp: 20 tablet, Rfl: 0 fluticasone (FLONASE) 50 MCG/ACT nasal spray, 1 spray by Nasal route daily.  , Disp: , Rfl: ;  propranolol (INDERAL LA) 80 MG 24 hr capsule, Take 1 capsule (80 mg total) by mouth daily., Disp: 30 capsule, Rfl: 11;  valsartan-hydrochlorothiazide (DIOVAN-HCT) 160-12.5 MG per tablet, Take 1 tablet by mouth daily., Disp: 90 tablet, Rfl: 3 varenicline (CHANTIX) 0.5 MG tablet, Take 2 tablets (1 mg total) by mouth 2 (two) times daily. With meals., Disp: 60 tablet, Rfl: 2;  albuterol (VENTOLIN HFA) 108 (90 BASE) MCG/ACT inhaler, Inhale 2 puffs into the lungs every 4 (four) hours as needed.  , Disp: , Rfl: ;  amLODipine (NORVASC) 10 MG tablet, Take 1 tablet (10 mg total) by mouth daily., Disp: 90 tablet, Rfl: 3 DULoxetine (CYMBALTA) 60 MG capsule, Take 1 capsule (60 mg total) by mouth daily. Take 1/2 tab po daily x 1 week, then take 1 tab daily, Disp: 30 capsule, Rfl: 3;  estrogens, conjugated, (PREMARIN) 0.3 MG tablet, Take 0.3 mg by mouth daily. Take daily for 21 days then do not take for 7 days. , Disp: , Rfl: ;  hydrOXYzine (ATARAX) 25 MG tablet, Take 25-50 mg by mouth every 6 (six) hours as needed. For itching , Disp: , Rfl:  lidocaine (LIDODERM) 5 %, apply one patch daily to area of greatest pain , Disp: , Rfl: ;  pregabalin (LYRICA) 50 MG  capsule, Take 1 capsule (50 mg total) by mouth 3 (three) times daily., Disp: 90 capsule, Rfl: 2;  sucralfate (CARAFATE) 1 GM/10ML suspension, Take 1 gram 4 times daily, between meals and at bedtime. , Disp: , Rfl:    ALLERGIES: Morphine and related; Codeine; and Penicillins    NARRATIVE: Theresa [name] was seen today for weekly treatment management. The chart was checked and CBCT images were reviewed. Pt states increased pain. Lortab not relieving pain - did take x2 - wants something different.  PHYSICAL EXAMINATION: weight is 229 lb 3.2 oz (103.964 kg). Her blood pressure is 116/81 and her pulse is 72. Her respiration is 18 and oxygen saturation is 93%.         ASSESSMENT: Patient tolerating treatments well.    PLAN: Continue treatment as planned. Gave script for dilaudid.

## 2011-01-28 NOTE — Telephone Encounter (Signed)
Added lb for 11/19. S/w pt today re new appt time for 11/19 @ 11:30 am.

## 2011-01-29 ENCOUNTER — Ambulatory Visit: Payer: Medicare Other

## 2011-01-31 ENCOUNTER — Other Ambulatory Visit: Payer: Self-pay | Admitting: Family Medicine

## 2011-01-31 ENCOUNTER — Ambulatory Visit
Admission: RE | Admit: 2011-01-31 | Discharge: 2011-01-31 | Disposition: A | Discharge status: Home / Self Care | Payer: Medicare Other | Source: Ambulatory Visit | Attending: Radiation Oncology | Admitting: Radiation Oncology

## 2011-01-31 ENCOUNTER — Telehealth: Payer: Self-pay | Admitting: Oncology

## 2011-01-31 ENCOUNTER — Ambulatory Visit (HOSPITAL_BASED_OUTPATIENT_CLINIC_OR_DEPARTMENT_OTHER): Payer: Medicare Other | Admitting: Internal Medicine

## 2011-01-31 ENCOUNTER — Other Ambulatory Visit (HOSPITAL_BASED_OUTPATIENT_CLINIC_OR_DEPARTMENT_OTHER): Payer: Medicare Other | Admitting: Lab

## 2011-01-31 VITALS — BP 120/76 | HR 79 | Temp 98.9°F | Ht 63.0 in | Wt 233.7 lb

## 2011-01-31 DIAGNOSIS — C3490 Malignant neoplasm of unspecified part of unspecified bronchus or lung: Secondary | ICD-10-CM

## 2011-01-31 DIAGNOSIS — C3410 Malignant neoplasm of upper lobe, unspecified bronchus or lung: Secondary | ICD-10-CM

## 2011-01-31 LAB — CBC WITH DIFFERENTIAL/PLATELET
BASO%: 0.3 % (ref 0.0–2.0)
Basophils Absolute: 0 10*3/uL (ref 0.0–0.1)
EOS%: 2.3 % (ref 0.0–7.0)
Eosinophils Absolute: 0.2 10*3/uL (ref 0.0–0.5)
HCT: 45.3 % (ref 34.8–46.6)
HGB: 15.3 g/dL (ref 11.6–15.9)
LYMPH%: 19.7 % (ref 14.0–49.7)
MCH: 30.7 pg (ref 25.1–34.0)
MCHC: 33.7 g/dL (ref 31.5–36.0)
MCV: 91.3 fL (ref 79.5–101.0)
MONO#: 0.4 10*3/uL (ref 0.1–0.9)
MONO%: 6.5 % (ref 0.0–14.0)
NEUT#: 4.7 10*3/uL (ref 1.5–6.5)
NEUT%: 71.2 % (ref 38.4–76.8)
Platelets: 203 10*3/uL (ref 145–400)
RBC: 4.97 10*6/uL (ref 3.70–5.45)
RDW: 13.9 % (ref 11.2–14.5)
WBC: 6.6 10*3/uL (ref 3.9–10.3)
lymph#: 1.3 10*3/uL (ref 0.9–3.3)

## 2011-01-31 LAB — COMPREHENSIVE METABOLIC PANEL
ALT: 15 U/L (ref 0–35)
AST: 15 U/L (ref 0–37)
Albumin: 4.3 g/dL (ref 3.5–5.2)
Alkaline Phosphatase: 42 U/L (ref 39–117)
BUN: 9 mg/dL (ref 6–23)
CO2: 28 mEq/L (ref 19–32)
Calcium: 9.3 mg/dL (ref 8.4–10.5)
Chloride: 103 mEq/L (ref 96–112)
Creatinine, Ser: 1.26 mg/dL — ABNORMAL HIGH (ref 0.50–1.10)
Glucose, Bld: 162 mg/dL — ABNORMAL HIGH (ref 70–99)
Potassium: 4.1 mEq/L (ref 3.5–5.3)
Sodium: 140 mEq/L (ref 135–145)
Total Bilirubin: 0.6 mg/dL (ref 0.3–1.2)
Total Protein: 6.8 g/dL (ref 6.0–8.3)

## 2011-01-31 MED ORDER — ALBUTEROL SULFATE HFA 108 (90 BASE) MCG/ACT IN AERS: 2.0000 | INHALATION_SPRAY | RESPIRATORY_TRACT | Status: DC | PRN

## 2011-01-31 NOTE — Progress Notes (Signed)
OFFICE PROGRESS NOTE  CHAMBERLAIN,RACHEL, MD, MD [address] [place] [address]  DIAGNOSIS: Recurrent non-small cell lung cancer initially diagnosed as stage IA (T1b, N0, MX) adenocarcinoma in November 2009.  PRIOR THERAPY: Status post left upper lobe trisegmentectomy with lymph node dissection under the care of Dr. Burney on March 24, 2009.  CURRENT THERAPY: Concurrent chemoradiation with carboplatin for AUC of 2 and paclitaxel 45 mg/M2 given weekly with radiation. The patient was started after his dose of her chemotherapy on 02/02/2011.  INTERVAL HISTORY: Theresa [name] 45 y.o. female returns to the clinic today for followup visit. She was accompanied her husband. She denied having any specific complaints today. She is tolerating her radiotherapy fairly well which was started 2 weeks ago. She is here today for evaluation and discussion of concurrent chemotherapy. She denied having any significant chest pain or shortness breath, denied having any cough or hemoptysis. She had mild nausea with the radiation.   MEDICAL HISTORY: Past Medical History  Diagnosis Date  . Diabetes mellitus type II   . Hypertension   . Hyperlipidemia   . Blindness, legal     glaucoma and retinitis  pigmentosa  . Glaucoma   . Onychomycosis   . Retinitis pigmentosa   . Chronic eczema   . Chronic headaches   . Lung cancer     lung ca dx 2010    ALLERGIES:  is allergic to morphine and related; codeine; and penicillins.  MEDICATIONS:  Current Outpatient Prescriptions  Medication Sig Dispense Refill  . albuterol (VENTOLIN HFA) 108 (90 BASE) MCG/ACT inhaler Inhale 2 puffs into the lungs every 4 (four) hours as needed.  2 Inhaler  5  . amLODipine (NORVASC) 10 MG tablet Take 1 tablet (10 mg total) by mouth daily.  90 tablet  3  . betamethasone dipropionate (DIPROLENE) 0.05 % ointment Apply topically 3 (three) times daily. Dispense 15 gram tube       . carvedilol (COREG) 25 MG tablet Take 1  tablet (25 mg total) by mouth 2 (two) times daily.  60 tablet  11  . doxycycline (VIBRA-TABS) 100 MG tablet Take 1 tablet (100 mg total) by mouth 2 (two) times daily.  20 tablet  0  . DULoxetine (CYMBALTA) 60 MG capsule Take 1 capsule (60 mg total) by mouth daily. Take 1/2 tab po daily x 1 week, then take 1 tab daily  30 capsule  3  . estrogens, conjugated, (PREMARIN) 0.3 MG tablet Take 0.3 mg by mouth daily. Take daily for 21 days then do not take for 7 days.       . fluticasone (FLONASE) 50 MCG/ACT nasal spray 1 spray by Nasal route daily.        . hydrOXYzine (ATARAX) 25 MG tablet Take 25-50 mg by mouth every 6 (six) hours as needed. For itching       . lidocaine (LIDODERM) 5 % apply one patch daily to area of greatest pain       . pregabalin (LYRICA) 50 MG capsule Take 1 capsule (50 mg total) by mouth 3 (three) times daily.  90 capsule  2  . propranolol (INDERAL LA) 80 MG 24 hr capsule Take 1 capsule (80 mg total) by mouth daily.  30 capsule  11  . sucralfate (CARAFATE) 1 GM/10ML suspension Take 1 gram 4 times daily, between meals and at bedtime.       . valsartan-hydrochlorothiazide (DIOVAN-HCT) 160-12.5 MG per tablet Take 1 tablet by mouth daily.  90 tablet    3  . varenicline (CHANTIX) 0.5 MG tablet Take 2 tablets (1 mg total) by mouth 2 (two) times daily. With meals.  60 tablet  2  . DISCONTD: albuterol (VENTOLIN HFA) 108 (90 BASE) MCG/ACT inhaler Inhale 2 puffs into the lungs every 4 (four) hours as needed.          SURGICAL HISTORY:  Past Surgical History  Procedure Date  . Lung surgery 03/24/08    L vats, L thoracotomy and LUL trisegmentectomy with node dissection  . Chest tube insertion  04/01/08    L hydropneumothorax    REVIEW OF SYSTEMS:  A comprehensive review of systems was negative.   PHYSICAL EXAMINATION: General appearance: alert, cooperative and no distress Head: Normocephalic, without obvious abnormality, atraumatic Neck: no adenopathy Lymph nodes: Cervical,  supraclavicular, and axillary nodes normal. Resp: clear to auscultation bilaterally Cardio: regular rate and rhythm, S1, S2 normal, no murmur, click, rub or gallop GI: soft, non-tender; bowel sounds normal; no masses,  no organomegaly Extremities: extremities normal, atraumatic, no cyanosis or edema Neurologic: Alert and oriented X 3, normal strength and tone. Normal symmetric reflexes. Normal coordination and gait  ECOG PERFORMANCE STATUS: 1 - Symptomatic but completely ambulatory  Blood pressure 120/76, pulse 79, temperature 98.9 F (37.2 C), height 5' 3" (1.6 m), weight 233 lb 11.2 oz (106.006 kg).  LABORATORY DATA: Lab Results  Component Value Date   WBC 6.6 01/31/2011   HGB 15.3 01/31/2011   HCT 45.3 01/31/2011   MCV 91.3 01/31/2011   PLT 203 01/31/2011      Chemistry      Component Value Date/Time   NA 139 11/08/2010 0923   NA 137 08/18/2010 0245   K 4.2 11/08/2010 0923   K 3.3* 08/18/2010 0245   CL 97* 11/08/2010 0923   CL 98 08/18/2010 0245   CO2 29 11/08/2010 0923   CO2 29 08/18/2010 0245   BUN 13 11/08/2010 0923   BUN 12 08/18/2010 0245   CREATININE 1.2 11/08/2010 0923   CREATININE 1.31* 08/18/2010 0245      Component Value Date/Time   CALCIUM 9.0 11/08/2010 0923   CALCIUM 9.8 08/18/2010 0245   ALKPHOS 59 11/08/2010 0923   ALKPHOS 47 08/18/2010 0245   AST 22 11/08/2010 0923   AST 17 08/18/2010 0245   ALT 15 08/18/2010 0245   BILITOT 1.00 11/08/2010 0923   BILITOT 0.5 08/18/2010 0245       RADIOGRAPHIC STUDIES: No results found.  ASSESSMENT:  This is very pleasant 45 years old African American female with locally recurrent non-small cell lung cancer, adenocarcinoma.   PLAN: I have a lengthy discussion with the patient and her husband today about her disease stage, prognosis and treatment options. I recommended for her concurrent chemoradiation with weekly carboplatin for AUC of 2 and paclitaxel 45 mg/M2. I discussed with the patient adverse effects of this treatment including but  not limited to Alopecia, myelosuppression, nausea and vomiting, liver or renal dysfunction. The patient agreed to the current plan. She was started after cycle of this treatment on 02/02/2011. She was given prescription today for Compazine 10 mg by mouth every 6 hours as needed for nausea. She will come back for followup visit in 2 weeks for evaluation and management any adverse effects of her treatment.  All questions were answered. The patient knows to call the clinic with any problems, questions or concerns. We can certainly see the patient much sooner if necessary.  I spent 20 minutes counseling the patient face   to face. The total time spent in the appointment was 40 minutes.      

## 2011-01-31 NOTE — Telephone Encounter (Signed)
gve the pt her nov,dec 2012 appt calendar °

## 2011-02-01 ENCOUNTER — Other Ambulatory Visit: Payer: Self-pay | Admitting: Internal Medicine

## 2011-02-01 ENCOUNTER — Ambulatory Visit
Admission: RE | Admit: 2011-02-01 | Discharge: 2011-02-01 | Disposition: A | Discharge status: Home / Self Care | Payer: Medicare Other | Source: Ambulatory Visit | Attending: Radiation Oncology | Admitting: Radiation Oncology

## 2011-02-01 ENCOUNTER — Other Ambulatory Visit: Payer: Medicare Other

## 2011-02-01 DIAGNOSIS — H548 Legal blindness, as defined in USA: Secondary | ICD-10-CM | POA: Insufficient documentation

## 2011-02-01 DIAGNOSIS — C3490 Malignant neoplasm of unspecified part of unspecified bronchus or lung: Secondary | ICD-10-CM

## 2011-02-02 ENCOUNTER — Ambulatory Visit (HOSPITAL_BASED_OUTPATIENT_CLINIC_OR_DEPARTMENT_OTHER): Payer: Medicare Other

## 2011-02-02 ENCOUNTER — Ambulatory Visit
Admission: RE | Admit: 2011-02-02 | Discharge: 2011-02-02 | Disposition: A | Discharge status: Home / Self Care | Payer: Medicare Other | Source: Ambulatory Visit | Attending: Radiation Oncology | Admitting: Radiation Oncology

## 2011-02-02 ENCOUNTER — Ambulatory Visit
Admission: RE | Admit: 2011-02-02 | Discharge: 2011-02-02 | Payer: Medicare Other | Source: Ambulatory Visit | Attending: Radiation Oncology | Admitting: Radiation Oncology

## 2011-02-02 DIAGNOSIS — Z5111 Encounter for antineoplastic chemotherapy: Secondary | ICD-10-CM

## 2011-02-02 DIAGNOSIS — C3490 Malignant neoplasm of unspecified part of unspecified bronchus or lung: Secondary | ICD-10-CM

## 2011-02-02 DIAGNOSIS — C3410 Malignant neoplasm of upper lobe, unspecified bronchus or lung: Secondary | ICD-10-CM

## 2011-02-02 MED ORDER — SODIUM CHLORIDE 0.9 % IV SOLN
Freq: Once | INTRAVENOUS | Status: AC
  Administered 2011-02-02: 13:00:00 via INTRAVENOUS

## 2011-02-02 MED ORDER — DIPHENHYDRAMINE HCL 50 MG/ML IJ SOLN
50.0000 mg | Freq: Once | INTRAMUSCULAR | Status: AC
  Administered 2011-02-02: 50 mg via INTRAVENOUS

## 2011-02-02 MED ORDER — DEXAMETHASONE SODIUM PHOSPHATE 4 MG/ML IJ SOLN
20.0000 mg | Freq: Once | INTRAMUSCULAR | Status: AC
  Administered 2011-02-02: 20 mg via INTRAVENOUS

## 2011-02-02 MED ORDER — SODIUM CHLORIDE 0.9 % IV SOLN
234.8000 mg | Freq: Once | INTRAVENOUS | Status: AC
  Administered 2011-02-02: 230 mg via INTRAVENOUS
  Filled 2011-02-02: qty 23

## 2011-02-02 MED ORDER — FAMOTIDINE IN NACL 20-0.9 MG/50ML-% IV SOLN
20.0000 mg | Freq: Once | INTRAVENOUS | Status: AC
  Administered 2011-02-02: 20 mg via INTRAVENOUS

## 2011-02-02 MED ORDER — ONDANSETRON 16 MG/50ML IVPB (CHCC)
16.0000 mg | Freq: Once | INTRAVENOUS | Status: AC
  Administered 2011-02-02: 16 mg via INTRAVENOUS

## 2011-02-02 MED ORDER — PACLITAXEL CHEMO INJECTION 300 MG/50ML
45.0000 mg/m2 | Freq: Once | INTRAVENOUS | Status: AC
  Administered 2011-02-02: 96 mg via INTRAVENOUS
  Filled 2011-02-02: qty 16

## 2011-02-02 NOTE — Patient Instructions (Signed)
1625-Pt discharged ambulatory with next appointment confirmed.  Pt aware to call with any questions or concerns.  

## 2011-02-02 NOTE — Progress Notes (Signed)
Pt cont to have prod cough w/ "Little bit of blood". Dilaudid prn w/good relief. Skin darkening slightly on pt's back.

## 2011-02-05 NOTE — Progress Notes (Signed)
[facility] Rad[facility]cology Weekly Treatment Note    Name: Theresa [name] Date: 02/05/2011 MRN: [id number] DOB: 09/02/1965  Status:outpatient    Current dose: 2400  Current fraction:12  Planned dose:6600  Planned fraction:33   MEDICATIONS:Current outpatient prescriptions:HYDROmorphone (DILAUDID) 2 MG tablet, Take 2 mg by mouth every 4 (four) hours as needed. Take 1-2 tabs q4-6 hr prn pain, disp 40, 0 refill , Disp: , Rfl: ;  albuterol (VENTOLIN HFA) 108 (90 BASE) MCG/ACT inhaler, Inhale 2 puffs into the lungs every 4 (four) hours as needed., Disp: 2 Inhaler, Rfl: 5;  amLODipine (NORVASC) 10 MG tablet, Take 1 tablet (10 mg total) by mouth daily., Disp: 90 tablet, Rfl: 3 betamethasone dipropionate (DIPROLENE) 0.05 % ointment, Apply topically 3 (three) times daily. Dispense 15 gram tube , Disp: , Rfl: ;  carvedilol (COREG) 25 MG tablet, Take 1 tablet (25 mg total) by mouth 2 (two) times daily., Disp: 60 tablet, Rfl: 11;  doxycycline (VIBRA-TABS) 100 MG tablet, Take 1 tablet (100 mg total) by mouth 2 (two) times daily., Disp: 20 tablet, Rfl: 0 DULoxetine (CYMBALTA) 60 MG capsule, Take 1 capsule (60 mg total) by mouth daily. Take 1/2 tab po daily x 1 week, then take 1 tab daily, Disp: 30 capsule, Rfl: 3;  estrogens, conjugated, (PREMARIN) 0.3 MG tablet, Take 0.3 mg by mouth daily. Take daily for 21 days then do not take for 7 days. , Disp: , Rfl: ;  fluticasone (FLONASE) 50 MCG/ACT nasal spray, 1 spray by Nasal route daily.  , Disp: , Rfl:  hydrOXYzine (ATARAX) 25 MG tablet, Take 25-50 mg by mouth every 6 (six) hours as needed. For itching , Disp: , Rfl: ;  lidocaine (LIDODERM) 5 %, apply one patch daily to area of greatest pain , Disp: , Rfl: ;  pregabalin (LYRICA) 50 MG capsule, Take 1 capsule (50 mg total) by mouth 3 (three) times daily., Disp: 90 capsule, Rfl: 2;  propranolol (INDERAL LA) 80 MG 24 hr capsule, Take 1 capsule (80 mg total) by mouth daily., Disp: 30 capsule, Rfl:  11 sucralfate (CARAFATE) 1 GM/10ML suspension, Take 1 gram 4 times daily, between meals and at bedtime. , Disp: , Rfl: ;  valsartan-hydrochlorothiazide (DIOVAN-HCT) 160-12.5 MG per tablet, Take 1 tablet by mouth daily., Disp: 90 tablet, Rfl: 3;  varenicline (CHANTIX) 0.5 MG tablet, Take 2 tablets (1 mg total) by mouth 2 (two) times daily. With meals., Disp: 60 tablet, Rfl: 2   ALLERGIES: Morphine and related; Codeine; and Penicillins  NARRATIVE: Theresa [name] was seen today for weekly treatment management. The chart was checked and port films images were reviewed. Pt doing well. Some productive cough with a little occasional blood is main complaint.  PHYSICAL EXAMINATION: weight is 231 lb 1.6 oz (104.826 kg).       ASSESSMENT: Patient tolerating treatments well.    PLAN: Continue treatment as planned. Gave tessalon script.

## 2011-02-07 ENCOUNTER — Other Ambulatory Visit: Payer: Self-pay

## 2011-02-07 ENCOUNTER — Telehealth: Payer: Self-pay | Admitting: *Deleted

## 2011-02-07 ENCOUNTER — Ambulatory Visit
Admission: RE | Admit: 2011-02-07 | Discharge: 2011-02-07 | Disposition: A | Discharge status: Home / Self Care | Payer: Medicare Other | Source: Ambulatory Visit | Attending: Radiation Oncology | Admitting: Radiation Oncology

## 2011-02-07 DIAGNOSIS — C3490 Malignant neoplasm of unspecified part of unspecified bronchus or lung: Secondary | ICD-10-CM

## 2011-02-07 NOTE — Telephone Encounter (Signed)
Pt is aware of port placement for 11/30.  Short stay will call with instructions and time to arrive.  Pt verbalized understanding.

## 2011-02-08 ENCOUNTER — Other Ambulatory Visit: Payer: Self-pay

## 2011-02-08 ENCOUNTER — Other Ambulatory Visit: Payer: Medicare Other | Admitting: Lab

## 2011-02-08 ENCOUNTER — Ambulatory Visit: Payer: Medicare Other

## 2011-02-08 ENCOUNTER — Ambulatory Visit
Admission: RE | Admit: 2011-02-08 | Discharge: 2011-02-08 | Disposition: A | Discharge status: Home / Self Care | Payer: Medicare Other | Source: Ambulatory Visit | Attending: Radiation Oncology | Admitting: Radiation Oncology

## 2011-02-08 ENCOUNTER — Telehealth: Payer: Self-pay | Admitting: *Deleted

## 2011-02-08 DIAGNOSIS — C3490 Malignant neoplasm of unspecified part of unspecified bronchus or lung: Secondary | ICD-10-CM

## 2011-02-09 ENCOUNTER — Ambulatory Visit
Admission: RE | Admit: 2011-02-09 | Discharge: 2011-02-09 | Disposition: A | Discharge status: Home / Self Care | Payer: Medicare Other | Source: Ambulatory Visit | Attending: Radiation Oncology | Admitting: Radiation Oncology

## 2011-02-09 ENCOUNTER — Encounter (HOSPITAL_COMMUNITY): Payer: Self-pay | Admitting: Pharmacy Technician

## 2011-02-10 ENCOUNTER — Ambulatory Visit
Admission: RE | Admit: 2011-02-10 | Discharge: 2011-02-10 | Disposition: A | Discharge status: Home / Self Care | Payer: Medicare Other | Source: Ambulatory Visit | Attending: Radiation Oncology | Admitting: Radiation Oncology

## 2011-02-10 ENCOUNTER — Encounter: Payer: Self-pay | Admitting: Radiation Oncology

## 2011-02-10 DIAGNOSIS — C3490 Malignant neoplasm of unspecified part of unspecified bronchus or lung: Secondary | ICD-10-CM

## 2011-02-10 MED ORDER — VANCOMYCIN HCL 1000 MG IV SOLR
1500.0000 mg | INTRAVENOUS | Status: DC
  Filled 2011-02-10: qty 1500

## 2011-02-10 NOTE — Progress Notes (Signed)
Patient presents to the clinic today accompanied by her sister for under treat visit with Dr. Mitzi Hansen. Patient is alert and oriented to person, place, and time. No distress noted. Steady gait noted. Pleasant affect noted. Patient complains of persistent nausea. Patient reports last episode of emesis was yesterday. Patient requesting prescription to relieve nausea. Patient reports constant throbbing and occasional cramping left chest pain 8 on a scale of 0-10.

## 2011-02-10 NOTE — Progress Notes (Signed)
Patient reports taking compazine 10 mg for nausea. Patient states,"the compazine doesn't work." Patient requesting new prescription for an antiemetic.

## 2011-02-10 NOTE — Progress Notes (Signed)
[facility] Rad[facility]cology Weekly Treatment Note    Name: Theresa [name] Date: 02/10/2011 MRN: [id number] DOB: November 26, 1965  Status:outpatient    Current dose: 3200  Current fraction:16  Planned dose:6600  Planned fraction:33   MEDICATIONS: Current Outpatient Prescriptions  Medication Sig Dispense Refill  . albuterol (VENTOLIN HFA) 108 (90 BASE) MCG/ACT inhaler Inhale 2 puffs into the lungs every 4 (four) hours as needed.  2 Inhaler  5  . amLODipine (NORVASC) 10 MG tablet Take 1 tablet (10 mg total) by mouth daily.  90 tablet  3  . betamethasone dipropionate (DIPROLENE) 0.05 % ointment Apply topically 3 (three) times daily. Dispense 15 gram tube       . carvedilol (COREG) 25 MG tablet Take 1 tablet (25 mg total) by mouth 2 (two) times daily.  60 tablet  11  . fluticasone (FLONASE) 50 MCG/ACT nasal spray 1 spray by Nasal route daily.        Marland Kitchen HYDROmorphone (DILAUDID) 2 MG tablet Take 2 mg by mouth every 4 (four) hours as needed. Take 1-2 tabs q4-6 hr prn pain, disp 40, 0 refill       . prochlorperazine (COMPAZINE) 10 MG tablet Take 10 mg by mouth every 6 (six) hours as needed.        . propranolol (INDERAL LA) 80 MG 24 hr capsule Take 1 capsule (80 mg total) by mouth daily.  30 capsule  11  . valsartan-hydrochlorothiazide (DIOVAN-HCT) 160-12.5 MG per tablet Take 1 tablet by mouth daily.  90 tablet  3  . varenicline (CHANTIX) 0.5 MG tablet Take 2 tablets (1 mg total) by mouth 2 (two) times daily. With meals.  60 tablet  2     ALLERGIES: Morphine and related; Codeine; and Penicillins   LABORATORY DATA:  Lab Results  Component Value Date   WBC 6.6 01/31/2011   HGB 15.3 01/31/2011   HCT 45.3 01/31/2011   MCV 91.3 01/31/2011   PLT 203 01/31/2011   Lab Results  Component Value Date   NA 140 01/31/2011   K 4.1 01/31/2011   CL 103 01/31/2011   CO2 28 01/31/2011   Lab Results  Component Value Date   ALT 15 01/31/2011   AST 15 01/31/2011   ALKPHOS 42  01/31/2011   BILITOT 0.6 01/31/2011      NARRATIVE: Theresa [name] was seen today for weekly treatment management. The chart was checked and port films images were reviewed. Pt c/o ongoing nausea. Compazine not working.  PHYSICAL EXAMINATION: weight is 229 lb 1.6 oz (103.919 kg). Her blood pressure is 95/61 and her pulse is 69. Her respiration is 18.     ASSESSMENT: Patient tolerating treatments well.    PLAN: Continue treatment as planned. Gave script for zofran.

## 2011-02-11 ENCOUNTER — Encounter (HOSPITAL_COMMUNITY)
Admission: RE | Disposition: A | Discharge status: Home / Self Care | Payer: Self-pay | Source: Ambulatory Visit | Attending: Thoracic Surgery

## 2011-02-11 ENCOUNTER — Ambulatory Visit (HOSPITAL_COMMUNITY)
Admission: RE | Admit: 2011-02-11 | Discharge: 2011-02-11 | Disposition: A | Discharge status: Home / Self Care | Payer: Medicare Other | Source: Ambulatory Visit | Attending: Thoracic Surgery | Admitting: Thoracic Surgery

## 2011-02-11 ENCOUNTER — Encounter (HOSPITAL_COMMUNITY): Payer: Self-pay | Admitting: *Deleted

## 2011-02-11 ENCOUNTER — Ambulatory Visit (HOSPITAL_COMMUNITY): Payer: Medicare Other

## 2011-02-11 ENCOUNTER — Encounter (HOSPITAL_COMMUNITY): Payer: Self-pay | Admitting: Anesthesiology

## 2011-02-11 ENCOUNTER — Ambulatory Visit (HOSPITAL_COMMUNITY): Payer: Medicare Other | Admitting: Anesthesiology

## 2011-02-11 ENCOUNTER — Ambulatory Visit: Payer: Medicare Other

## 2011-02-11 DIAGNOSIS — E119 Type 2 diabetes mellitus without complications: Secondary | ICD-10-CM | POA: Insufficient documentation

## 2011-02-11 DIAGNOSIS — C3490 Malignant neoplasm of unspecified part of unspecified bronchus or lung: Secondary | ICD-10-CM

## 2011-02-11 DIAGNOSIS — I10 Essential (primary) hypertension: Secondary | ICD-10-CM | POA: Insufficient documentation

## 2011-02-11 DIAGNOSIS — F17200 Nicotine dependence, unspecified, uncomplicated: Secondary | ICD-10-CM | POA: Insufficient documentation

## 2011-02-11 HISTORY — DX: Anxiety disorder, unspecified: F41.9

## 2011-02-11 HISTORY — PX: PORTACATH PLACEMENT: SHX2246

## 2011-02-11 HISTORY — DX: Fibromyalgia: M79.7

## 2011-02-11 LAB — CBC
HCT: 43.3 % (ref 36.0–46.0)
Hemoglobin: 14.8 g/dL (ref 12.0–15.0)
MCH: 31.5 pg (ref 26.0–34.0)
MCHC: 34.2 g/dL (ref 30.0–36.0)
MCV: 92.1 fL (ref 78.0–100.0)
Platelets: 215 10*3/uL (ref 150–400)
RBC: 4.7 MIL/uL (ref 3.87–5.11)
RDW: 13.2 % (ref 11.5–15.5)
WBC: 8 10*3/uL (ref 4.0–10.5)

## 2011-02-11 LAB — COMPREHENSIVE METABOLIC PANEL
ALT: 10 U/L (ref 0–35)
AST: 11 U/L (ref 0–37)
Albumin: 3.8 g/dL (ref 3.5–5.2)
Alkaline Phosphatase: 43 U/L (ref 39–117)
BUN: 12 mg/dL (ref 6–23)
CO2: 31 mEq/L (ref 19–32)
Calcium: 9 mg/dL (ref 8.4–10.5)
Chloride: 99 mEq/L (ref 96–112)
Creatinine, Ser: 1.36 mg/dL — ABNORMAL HIGH (ref 0.50–1.10)
GFR calc Af Amer: 53 mL/min — ABNORMAL LOW (ref >90)
GFR calc non Af Amer: 46 mL/min — ABNORMAL LOW (ref >90)
Glucose, Bld: 108 mg/dL — ABNORMAL HIGH (ref 70–99)
Potassium: 3.9 mEq/L (ref 3.5–5.1)
Sodium: 139 mEq/L (ref 135–145)
Total Bilirubin: 0.9 mg/dL (ref 0.3–1.2)
Total Protein: 6.8 g/dL (ref 6.0–8.3)

## 2011-02-11 LAB — SURGICAL PCR SCREEN
MRSA, PCR: NEGATIVE
Staphylococcus aureus: NEGATIVE

## 2011-02-11 LAB — APTT: aPTT: 32 seconds (ref 24–37)

## 2011-02-11 LAB — PROTIME-INR
INR: 1.11 (ref 0.00–1.49)
Prothrombin Time: 14.5 seconds (ref 11.6–15.2)

## 2011-02-11 SURGERY — INSERTION, TUNNELED CENTRAL VENOUS DEVICE, WITH PORT
Anesthesia: Monitor Anesthesia Care | Site: Chest | Laterality: Right | Wound class: Clean

## 2011-02-11 MED ORDER — MIDAZOLAM HCL 5 MG/5ML IJ SOLN
INTRAMUSCULAR | Status: DC | PRN
  Administered 2011-02-11 (×2): 0.5 mg via INTRAVENOUS
  Administered 2011-02-11: 1 mg via INTRAVENOUS

## 2011-02-11 MED ORDER — SODIUM CHLORIDE 0.9 % IR SOLN
Status: DC | PRN
  Administered 2011-02-11: 1000 mL

## 2011-02-11 MED ORDER — PROPOFOL 10 MG/ML IV EMUL
INTRAVENOUS | Status: DC | PRN
  Administered 2011-02-11: 50 ug/kg/min via INTRAVENOUS

## 2011-02-11 MED ORDER — HYDROMORPHONE HCL PF 1 MG/ML IJ SOLN: 0.2500 mg | INTRAMUSCULAR | Status: DC | PRN

## 2011-02-11 MED ORDER — LIDOCAINE-EPINEPHRINE (PF) 1 %-1:200000 IJ SOLN
INTRAMUSCULAR | Status: DC | PRN
  Administered 2011-02-11: 16 mL

## 2011-02-11 MED ORDER — FENTANYL CITRATE 0.05 MG/ML IJ SOLN
INTRAMUSCULAR | Status: DC | PRN
  Administered 2011-02-11 (×8): 25 ug via INTRAVENOUS

## 2011-02-11 MED ORDER — OXYCODONE HCL 5 MG PO TABS
5.0000 mg | ORAL_TABLET | ORAL | Status: DC | PRN
  Administered 2011-02-11: 10 mg via ORAL

## 2011-02-11 MED ORDER — MUPIROCIN 2 % EX OINT
TOPICAL_OINTMENT | CUTANEOUS | Status: AC
  Filled 2011-02-11: qty 22

## 2011-02-11 MED ORDER — LACTATED RINGERS IV SOLN
INTRAVENOUS | Status: DC
  Administered 2011-02-11: 11:00:00 via INTRAVENOUS

## 2011-02-11 MED ORDER — SODIUM CHLORIDE 0.9 % IR SOLN
Status: DC | PRN
  Administered 2011-02-11: 11:00:00

## 2011-02-11 MED ORDER — ONDANSETRON HCL 4 MG/2ML IJ SOLN: 4.0000 mg | Freq: Once | INTRAMUSCULAR | Status: DC | PRN

## 2011-02-11 MED ORDER — LACTATED RINGERS IV SOLN
INTRAVENOUS | Status: DC | PRN
  Administered 2011-02-11: 10:00:00 via INTRAVENOUS

## 2011-02-11 MED ORDER — VANCOMYCIN HCL IN DEXTROSE 1-5 GM/200ML-% IV SOLN
INTRAVENOUS | Status: AC
  Administered 2011-02-11: 1000 mg via INTRAVENOUS
  Filled 2011-02-11: qty 200

## 2011-02-11 SURGICAL SUPPLY — 47 items
ADH SKN CLS APL DERMABOND .7 (GAUZE/BANDAGES/DRESSINGS) ×1
ADH SKN CLS LQ APL DERMABOND (GAUZE/BANDAGES/DRESSINGS) ×1
BAG DECANTER FOR FLEXI CONT (MISCELLANEOUS) ×2 IMPLANT
BLADE SURG 11 STRL SS (BLADE) ×2 IMPLANT
CANISTER SUCTION 2500CC (MISCELLANEOUS) ×2 IMPLANT
CLOTH BEACON ORANGE TIMEOUT ST (SAFETY) ×2 IMPLANT
COVER SURGICAL LIGHT HANDLE (MISCELLANEOUS) ×4 IMPLANT
DERMABOND ADHESIVE PROPEN (GAUZE/BANDAGES/DRESSINGS) ×1
DERMABOND ADVANCED (GAUZE/BANDAGES/DRESSINGS) ×1
DERMABOND ADVANCED .7 DNX12 (GAUZE/BANDAGES/DRESSINGS) ×1 IMPLANT
DERMABOND ADVANCED .7 DNX6 (GAUZE/BANDAGES/DRESSINGS) IMPLANT
DRAPE C-ARM 42X72 X-RAY (DRAPES) ×2 IMPLANT
DRAPE CHEST BREAST 15X10 FENES (DRAPES) ×2 IMPLANT
ELECT REM PT RETURN 9FT ADLT (ELECTROSURGICAL) ×2
ELECTRODE REM PT RTRN 9FT ADLT (ELECTROSURGICAL) ×1 IMPLANT
GLOVE BIOGEL PI IND STRL 6.5 (GLOVE) IMPLANT
GLOVE BIOGEL PI IND STRL 7.0 (GLOVE) IMPLANT
GLOVE BIOGEL PI INDICATOR 6.5 (GLOVE) ×1
GLOVE BIOGEL PI INDICATOR 7.0 (GLOVE) ×1
GLOVE SS BIOGEL STRL SZ 6.5 (GLOVE) IMPLANT
GLOVE SUPERSENSE BIOGEL SZ 6.5 (GLOVE) ×1
GLOVE SURG SIGNA 7.5 PF LTX (GLOVE) ×2 IMPLANT
GOWN BRE IMP PREV XXLGXLNG (GOWN DISPOSABLE) ×2 IMPLANT
GOWN STRL NON-REIN LRG LVL3 (GOWN DISPOSABLE) ×2 IMPLANT
GUIDEWIRE UNCOATED ST S 7038 (WIRE) IMPLANT
INTRODUCER 13FR (MISCELLANEOUS) IMPLANT
INTRODUCER COOK 11FR (CATHETERS) IMPLANT
KIT BASIN OR (CUSTOM PROCEDURE TRAY) ×2 IMPLANT
KIT PORT POWER 9.6FR MRI PREA (Catheter) ×1 IMPLANT
KIT PORT POWER ISP 8FR (Catheter) IMPLANT
KIT POWER CATH 8FR (Catheter) IMPLANT
KIT ROOM TURNOVER OR (KITS) ×2 IMPLANT
NEEDLE 22X1 1/2 (OR ONLY) (NEEDLE) ×2 IMPLANT
NS IRRIG 1000ML POUR BTL (IV SOLUTION) ×2 IMPLANT
PACK GENERAL/GYN (CUSTOM PROCEDURE TRAY) ×2 IMPLANT
PAD ARMBOARD 7.5X6 YLW CONV (MISCELLANEOUS) ×4 IMPLANT
SET SHEATH INTRODUCER 10FR (MISCELLANEOUS) IMPLANT
SPONGE GAUZE 4X4 12PLY (GAUZE/BANDAGES/DRESSINGS) ×2 IMPLANT
SUT SILK 2 0 SH (SUTURE) ×2 IMPLANT
SUT VIC AB 3-0 SH 27 (SUTURE) ×2
SUT VIC AB 3-0 SH 27X BRD (SUTURE) ×1 IMPLANT
SYR 20CC LL (SYRINGE) ×2 IMPLANT
SYR CONTROL 10ML LL (SYRINGE) ×2 IMPLANT
TAPE CLOTH SURG 4X10 WHT LF (GAUZE/BANDAGES/DRESSINGS) ×1 IMPLANT
TOWEL OR 17X24 6PK STRL BLUE (TOWEL DISPOSABLE) ×2 IMPLANT
TOWEL OR 17X26 10 PK STRL BLUE (TOWEL DISPOSABLE) ×2 IMPLANT
WATER STERILE IRR 1000ML POUR (IV SOLUTION) ×2 IMPLANT

## 2011-02-11 NOTE — Anesthesia Preprocedure Evaluation (Signed)
Anesthesia Evaluation  Patient identified by MRN, date of birth, ID band Patient awake    Reviewed: Allergy & Precautions, H&P , NPO status , Patient's Chart, lab work & pertinent test results  Airway Mallampati: I TM Distance: >3 FB Neck ROM: full    Dental  (+) Teeth Intact   Pulmonary shortness of breath and with exertion, Current Smoker,    Pulmonary exam normal       Cardiovascular hypertension, regular Normal    Neuro/Psych  Headaches, PSYCHIATRIC DISORDERS  Neuromuscular disease    GI/Hepatic negative GI ROS, Neg liver ROS,   Endo/Other  Diabetes mellitus-  Renal/GU   Genitourinary negative   Musculoskeletal   Abdominal   Peds  Hematology   Anesthesia Other Findings   Reproductive/Obstetrics                           Anesthesia Physical Anesthesia Plan  ASA: II  Anesthesia Plan: MAC   Post-op Pain Management:    Induction: Intravenous  Airway Management Planned: Mask  Additional Equipment:   Intra-op Plan:   Post-operative Plan:   Informed Consent: I have reviewed the patients History and Physical, chart, labs and discussed the procedure including the risks, benefits and alternatives for the proposed anesthesia with the patient or authorized representative who has indicated his/her understanding and acceptance.     Plan Discussed with: Anesthesiologist, CRNA and Surgeon  Anesthesia Plan Comments:         Anesthesia Quick Evaluation

## 2011-02-11 NOTE — Brief Op Note (Signed)
02/11/2011  11:21 AM  PATIENT:  Theresa [name]  45 y.o. female  PRE-OPERATIVE DIAGNOSIS:  Lung Cancer  POST-OPERATIVE DIAGNOSIS:  Lung Cancer  PROCEDURE:  Procedure(s): INSERTION PORT-A-CATH  SURGEON:  Surgeon(s): D Karle Plumber, MD  PHYSICIAN ASSISTANT:   ASSISTANTS: none   ANESTHESIA:   local  EBL:     BLOOD ADMINISTERED:none  DRAINS: none   LOCAL MEDICATIONS USED:  LIDOCAINE 30CC  SPECIMEN:  No Specimen  DISPOSITION OF SPECIMEN:  N/A  COUNTS:  YES  TOURNIQUET:  * No tourniquets in log *  DICTATION: .Other Dictation: Dictation Number [id number]  PLAN OF CARE: Discharge to home after PACU  PATIENT DISPOSITION:  PACU - hemodynamically stable.   Delay start of Pharmacological VTE agent (>24hrs) due to surgical blood loss or risk of bleeding:yes

## 2011-02-11 NOTE — Interval H&P Note (Signed)
History and Physical Interval Note:  02/11/2011 9:55 AM  Theresa [name]  has presented today for surgery, with the diagnosis of Ca lung  The various methods of treatment have been discussed with the patient and family. After consideration of risks, benefits and other options for treatment, the patient has consented to  Procedure(s): INSERTION PORT-A-CATH as a surgical intervention .  The patients' history has been reviewed, patient examined, no change in status, stable for surgery.  I have reviewed the patients' chart and labs.  Questions were answered to the patient's satisfaction.     Cameron Proud

## 2011-02-11 NOTE — Anesthesia Postprocedure Evaluation (Signed)
  Anesthesia Post-op Note  Patient: Theresa [name]  Procedure(s) Performed:  INSERTION PORT-A-CATH - 9.6Fr. Pre-attached Power Port in Right Internal Jugular  Patient Location: PACU  Anesthesia Type: MAC  Level of Consciousness: awake, sedated and patient cooperative  Airway and Oxygen Therapy: Patient Spontanous Breathing and Patient connected to nasal cannula oxygen  Post-op Pain: mild  Post-op Assessment: Post-op Vital signs reviewed, Patient's Cardiovascular Status Stable, Respiratory Function Stable, Patent Airway, No signs of Nausea or vomiting and Pain level controlled  Post-op Vital Signs: stable  Complications: No apparent anesthesia complications

## 2011-02-11 NOTE — Op Note (Signed)
NAMECARRIANNE, Theresa [name]                ACCOUNT NO.:  [id number]  MEDICAL RECORD NO.:  [id number]  LOCATION:  [facility]                         FACILITY:  [facility]  PHYSICIAN:  Ines Bloomer, M.D. DATE OF BIRTH:  07-21-1965  DATE OF PROCEDURE:  02/11/2011 DATE OF DISCHARGE:                              OPERATIVE REPORT   PREOPERATIVE DIAGNOSIS:  Non-small cell lung cancer recurrence.  POSTOPERATIVE DIAGNOSIS:  Non-small cell lung cancer recurrence.  OPERATION:  Insertion of right IJ Port-A-Cath.  SURGEON:  Ines Bloomer, M.D.  ANESTHESIA:  1% Xylocaine and IV sedation.  OPERATION PERFORMED:  After prep and draping of the right chest and area was infiltrated with 1% Xylocaine in the supraclavicular area and right IJ puncture was performed and a guidewire threaded under fluoro to the right atrium.  Another area was infiltrated inferiorly and a transverse incision was made and dissection was carried down through the subcutaneous tissues.  A-9.6 pre-attached Bard Port-A-Cath was inserted and sutured in place with 2-0 silk.  It was then tunneled from the pocket to the stab wound and measured appropriate to go the right atrial SVC junction.  Over the guidewire, passed a dilator with peel-away sheath.  Dilator and guidewire were removed and the tubing passed through the peel-away sheath.  The peel-away sheath was removed.  This was confirmed a median and the distal SVC.  Wounds were closed with 3-0 Vicryl and Dermabond for the skin.  The patient tolerated the procedure well.     Ines Bloomer, M.D.     DPB/MEDQ  D:  02/11/2011  T:  02/11/2011  Job:  [id number]

## 2011-02-11 NOTE — H&P (View-Only) (Signed)
OFFICE PROGRESS NOTE  CHAMBERLAIN,RACHEL, MD, MD [address] [place] [address]  DIAGNOSIS: Recurrent non-small cell lung cancer initially diagnosed as stage IA (T1b, N0, MX) adenocarcinoma in November 2009.  PRIOR THERAPY: Status post left upper lobe trisegmentectomy with lymph node dissection under the care of Dr. Edwyna Shell on March 24, 2009.  CURRENT THERAPY: Concurrent chemoradiation with carboplatin for AUC of 2 and paclitaxel 45 mg/M2 given weekly with radiation. The patient was started after his dose of her chemotherapy on 02/02/2011.  INTERVAL HISTORY: Theresa [name] 45 y.o. female returns to the clinic today for followup visit. She was accompanied her husband. She denied having any specific complaints today. She is tolerating her radiotherapy fairly well which was started 2 weeks ago. She is here today for evaluation and discussion of concurrent chemotherapy. She denied having any significant chest pain or shortness breath, denied having any cough or hemoptysis. She had mild nausea with the radiation.   MEDICAL HISTORY: Past Medical History  Diagnosis Date  . Diabetes mellitus type II   . Hypertension   . Hyperlipidemia   . Blindness, legal     glaucoma and retinitis  pigmentosa  . Glaucoma   . Onychomycosis   . Retinitis pigmentosa   . Chronic eczema   . Chronic headaches   . Lung cancer     lung ca dx 2010    ALLERGIES:  is allergic to morphine and related; codeine; and penicillins.  MEDICATIONS:  Current Outpatient Prescriptions  Medication Sig Dispense Refill  . albuterol (VENTOLIN HFA) 108 (90 BASE) MCG/ACT inhaler Inhale 2 puffs into the lungs every 4 (four) hours as needed.  2 Inhaler  5  . amLODipine (NORVASC) 10 MG tablet Take 1 tablet (10 mg total) by mouth daily.  90 tablet  3  . betamethasone dipropionate (DIPROLENE) 0.05 % ointment Apply topically 3 (three) times daily. Dispense 15 gram tube       . carvedilol (COREG) 25 MG tablet Take 1  tablet (25 mg total) by mouth 2 (two) times daily.  60 tablet  11  . doxycycline (VIBRA-TABS) 100 MG tablet Take 1 tablet (100 mg total) by mouth 2 (two) times daily.  20 tablet  0  . DULoxetine (CYMBALTA) 60 MG capsule Take 1 capsule (60 mg total) by mouth daily. Take 1/2 tab po daily x 1 week, then take 1 tab daily  30 capsule  3  . estrogens, conjugated, (PREMARIN) 0.3 MG tablet Take 0.3 mg by mouth daily. Take daily for 21 days then do not take for 7 days.       . fluticasone (FLONASE) 50 MCG/ACT nasal spray 1 spray by Nasal route daily.        . hydrOXYzine (ATARAX) 25 MG tablet Take 25-50 mg by mouth every 6 (six) hours as needed. For itching       . lidocaine (LIDODERM) 5 % apply one patch daily to area of greatest pain       . pregabalin (LYRICA) 50 MG capsule Take 1 capsule (50 mg total) by mouth 3 (three) times daily.  90 capsule  2  . propranolol (INDERAL LA) 80 MG 24 hr capsule Take 1 capsule (80 mg total) by mouth daily.  30 capsule  11  . sucralfate (CARAFATE) 1 GM/10ML suspension Take 1 gram 4 times daily, between meals and at bedtime.       . valsartan-hydrochlorothiazide (DIOVAN-HCT) 160-12.5 MG per tablet Take 1 tablet by mouth daily.  90 tablet  3  . varenicline (CHANTIX) 0.5 MG tablet Take 2 tablets (1 mg total) by mouth 2 (two) times daily. With meals.  60 tablet  2  . DISCONTD: albuterol (VENTOLIN HFA) 108 (90 BASE) MCG/ACT inhaler Inhale 2 puffs into the lungs every 4 (four) hours as needed.          SURGICAL HISTORY:  Past Surgical History  Procedure Date  . Lung surgery 03/24/08    L vats, L thoracotomy and LUL trisegmentectomy with node dissection  . Chest tube insertion  04/01/08    L hydropneumothorax    REVIEW OF SYSTEMS:  A comprehensive review of systems was negative.   PHYSICAL EXAMINATION: General appearance: alert, cooperative and no distress Head: Normocephalic, without obvious abnormality, atraumatic Neck: no adenopathy Lymph nodes: Cervical,  supraclavicular, and axillary nodes normal. Resp: clear to auscultation bilaterally Cardio: regular rate and rhythm, S1, S2 normal, no murmur, click, rub or gallop GI: soft, non-tender; bowel sounds normal; no masses,  no organomegaly Extremities: extremities normal, atraumatic, no cyanosis or edema Neurologic: Alert and oriented X 3, normal strength and tone. Normal symmetric reflexes. Normal coordination and gait  ECOG PERFORMANCE STATUS: 1 - Symptomatic but completely ambulatory  Blood pressure 120/76, pulse 79, temperature 98.9 F (37.2 C), height 5\' 3"  (1.6 m), weight 233 lb 11.2 oz (106.006 kg).  LABORATORY DATA: Lab Results  Component Value Date   WBC 6.6 01/31/2011   HGB 15.3 01/31/2011   HCT 45.3 01/31/2011   MCV 91.3 01/31/2011   PLT 203 01/31/2011      Chemistry      Component Value Date/Time   NA 139 11/08/2010 0923   NA 137 08/18/2010 0245   K 4.2 11/08/2010 0923   K 3.3* 08/18/2010 0245   CL 97* 11/08/2010 0923   CL 98 08/18/2010 0245   CO2 29 11/08/2010 0923   CO2 29 08/18/2010 0245   BUN 13 11/08/2010 0923   BUN 12 08/18/2010 0245   CREATININE 1.2 11/08/2010 0923   CREATININE 1.31* 08/18/2010 0245      Component Value Date/Time   CALCIUM 9.0 11/08/2010 0923   CALCIUM 9.8 08/18/2010 0245   ALKPHOS 59 11/08/2010 0923   ALKPHOS 47 08/18/2010 0245   AST 22 11/08/2010 0923   AST 17 08/18/2010 0245   ALT 15 08/18/2010 0245   BILITOT 1.00 11/08/2010 0923   BILITOT 0.5 08/18/2010 0245       RADIOGRAPHIC STUDIES: No results found.  ASSESSMENT:  This is very pleasant 45 years old [place] American female with locally recurrent non-small cell lung cancer, adenocarcinoma.   PLAN: I have a lengthy discussion with the patient and her husband today about her disease stage, prognosis and treatment options. I recommended for her concurrent chemoradiation with weekly carboplatin for AUC of 2 and paclitaxel 45 mg/M2. I discussed with the patient adverse effects of this treatment including but  not limited to Alopecia, myelosuppression, nausea and vomiting, liver or renal dysfunction. The patient agreed to the current plan. She was started after cycle of this treatment on 02/02/2011. She was given prescription today for Compazine 10 mg by mouth every 6 hours as needed for nausea. She will come back for followup visit in 2 weeks for evaluation and management any adverse effects of her treatment.  All questions were answered. The patient knows to call the clinic with any problems, questions or concerns. We can certainly see the patient much sooner if necessary.  I spent 20 minutes counseling the patient face  to face. The total time spent in the appointment was 40 minutes.

## 2011-02-11 NOTE — Transfer of Care (Signed)
Immediate Anesthesia Transfer of Care Note  Patient: Theresa [name]  Procedure(s) Performed:  INSERTION PORT-A-CATH - 9.6Fr. Pre-attached Power Port in Right Internal Jugular  Patient Location: PACU  Anesthesia Type: MAC  Level of Consciousness: alert  and patient cooperative  Airway & Oxygen Therapy: Patient Spontanous Breathing and Patient connected to face mask oxygen  Post-op Assessment: Report given to PACU RN and Post -op Vital signs reviewed and stable  Post vital signs: Reviewed  Complications: No apparent anesthesia complications

## 2011-02-11 NOTE — Preoperative (Signed)
Beta Blockers   Reason not to administer Beta Blockers:Not Applicable 

## 2011-02-14 ENCOUNTER — Encounter (HOSPITAL_COMMUNITY): Payer: Self-pay | Admitting: Thoracic Surgery

## 2011-02-14 ENCOUNTER — Ambulatory Visit: Payer: Medicare Other

## 2011-02-15 ENCOUNTER — Ambulatory Visit: Payer: Medicare Other

## 2011-02-15 ENCOUNTER — Other Ambulatory Visit: Payer: Medicare Other

## 2011-02-15 ENCOUNTER — Ambulatory Visit: Payer: Medicare Other | Admitting: Thoracic Surgery

## 2011-02-15 ENCOUNTER — Ambulatory Visit: Payer: Medicare Other | Admitting: Physician Assistant

## 2011-02-16 ENCOUNTER — Ambulatory Visit: Payer: Medicare Other | Admitting: Family Medicine

## 2011-02-16 ENCOUNTER — Ambulatory Visit: Payer: Medicare Other

## 2011-02-17 ENCOUNTER — Telehealth: Payer: Self-pay | Admitting: Radiation Oncology

## 2011-02-17 ENCOUNTER — Ambulatory Visit: Payer: Medicare Other

## 2011-02-17 NOTE — Telephone Encounter (Signed)
  1:45 PM Finally, able to reach this patient via phone. She is in [place], caring for her mother, and attempting to get home for treatment. Instructed patient she would need to be seen by Dr. Mitzi Hansen prior to her next treatment since she had missed more than four. Instructed patient that consistency of these treatment were very important and patient verbalized understanding. Patient to contact this Clinical research associate once she returns to [place]. Marcelino Duster, RT RT and Dr. Mitzi Hansen notified of these findings.

## 2011-02-17 NOTE — Progress Notes (Signed)
11:42 AM Informed by Florentina Addison, RT RT, that this patient left a message stating she was in [place] trying to get back to [place]. Patient verbalized she was there caring for her mother who is in poor health with congestive heart failure. Katie verbalized that she attempted to reach/called the patient but was unsuccessful. Katie left a message for her to contact our staff as soon as possible.

## 2011-02-17 NOTE — Telephone Encounter (Signed)
02/16/2011 at 1259. Patient has not shown for her last 4 treatments. Phoned patient, identified myself, and patient hung up the phone.

## 2011-02-18 ENCOUNTER — Other Ambulatory Visit: Payer: Self-pay | Admitting: Family Medicine

## 2011-02-18 ENCOUNTER — Ambulatory Visit: Payer: Medicare Other

## 2011-02-18 NOTE — Telephone Encounter (Signed)
Refill request

## 2011-02-21 ENCOUNTER — Ambulatory Visit
Admission: RE | Admit: 2011-02-21 | Discharge: 2011-02-21 | Disposition: A | Discharge status: Home / Self Care | Payer: Medicare Other | Source: Ambulatory Visit | Attending: Radiation Oncology | Admitting: Radiation Oncology

## 2011-02-21 ENCOUNTER — Encounter: Payer: Self-pay | Admitting: Radiation Oncology

## 2011-02-21 DIAGNOSIS — C3490 Malignant neoplasm of unspecified part of unspecified bronchus or lung: Secondary | ICD-10-CM

## 2011-02-21 NOTE — Progress Notes (Signed)
Patient seen today prior to xrt. She's back in town.  Needs refill on pain meds and nausea meds. States will schedule f/u with Dr. Edwyna Shell as was requested.  A/P - Will proceed with xrt. Stressed once again the importance of not missing any more treatments. Pt left town against medical advice and has not had any treatments since 02/10/11.

## 2011-02-21 NOTE — Progress Notes (Signed)
Patient presented to the clinic today prior to treatment. Patient accompanied by her daughter and husband. Patient has been in [place] caring for her mother. Patient has not received any radiation treatment since 02/10/2011. Patient is alert and oriented to person, place, and time. No distress noted. Slow steady gait noted. Pleasant affect noted. Patient reports constant aching upper back pain 8 on a scale of 0-10. Patient requesting a refill on her pain medication. Also, patient reports tenderness at right upper chest porta cath site. Patient reports persistent nausea. Patient requesting a refill of her antiemetic. Patient reports SOB with minimal exertion. Patient reports a persistent dry cough. All findings reported to Dr. Mitzi Hansen.

## 2011-02-22 ENCOUNTER — Other Ambulatory Visit: Payer: Self-pay | Admitting: Internal Medicine

## 2011-02-22 ENCOUNTER — Other Ambulatory Visit: Payer: Self-pay | Admitting: Certified Registered Nurse Anesthetist

## 2011-02-22 ENCOUNTER — Ambulatory Visit (HOSPITAL_BASED_OUTPATIENT_CLINIC_OR_DEPARTMENT_OTHER): Payer: Medicare Other

## 2011-02-22 ENCOUNTER — Ambulatory Visit
Admission: RE | Admit: 2011-02-22 | Discharge: 2011-02-22 | Disposition: A | Discharge status: Home / Self Care | Payer: Medicare Other | Source: Ambulatory Visit | Attending: Radiation Oncology | Admitting: Radiation Oncology

## 2011-02-22 ENCOUNTER — Other Ambulatory Visit (HOSPITAL_BASED_OUTPATIENT_CLINIC_OR_DEPARTMENT_OTHER): Payer: Medicare Other | Admitting: Lab

## 2011-02-22 VITALS — BP 97/63 | HR 61 | Temp 98.1°F

## 2011-02-22 DIAGNOSIS — Z5111 Encounter for antineoplastic chemotherapy: Secondary | ICD-10-CM

## 2011-02-22 DIAGNOSIS — C3410 Malignant neoplasm of upper lobe, unspecified bronchus or lung: Secondary | ICD-10-CM

## 2011-02-22 DIAGNOSIS — C3490 Malignant neoplasm of unspecified part of unspecified bronchus or lung: Secondary | ICD-10-CM

## 2011-02-22 LAB — CBC WITH DIFFERENTIAL/PLATELET
BASO%: 0.5 % (ref 0.0–2.0)
Basophils Absolute: 0 10*3/uL (ref 0.0–0.1)
EOS%: 5.5 % (ref 0.0–7.0)
Eosinophils Absolute: 0.3 10*3/uL (ref 0.0–0.5)
HCT: 42.2 % (ref 34.8–46.6)
HGB: 14.6 g/dL (ref 11.6–15.9)
LYMPH%: 16.1 % (ref 14.0–49.7)
MCH: 31.4 pg (ref 25.1–34.0)
MCHC: 34.5 g/dL (ref 31.5–36.0)
MCV: 91.1 fL (ref 79.5–101.0)
MONO#: 0.3 10*3/uL (ref 0.1–0.9)
MONO%: 6.6 % (ref 0.0–14.0)
NEUT#: 3.8 10*3/uL (ref 1.5–6.5)
NEUT%: 71.3 % (ref 38.4–76.8)
Platelets: 182 10*3/uL (ref 145–400)
RBC: 4.64 10*6/uL (ref 3.70–5.45)
RDW: 14 % (ref 11.2–14.5)
WBC: 5.3 10*3/uL (ref 3.9–10.3)
lymph#: 0.8 10*3/uL — ABNORMAL LOW (ref 0.9–3.3)

## 2011-02-22 LAB — COMPREHENSIVE METABOLIC PANEL
ALT: 12 U/L (ref 0–35)
AST: 12 U/L (ref 0–37)
Albumin: 4 g/dL (ref 3.5–5.2)
Alkaline Phosphatase: 41 U/L (ref 39–117)
BUN: 11 mg/dL (ref 6–23)
CO2: 30 mEq/L (ref 19–32)
Calcium: 9.1 mg/dL (ref 8.4–10.5)
Chloride: 103 mEq/L (ref 96–112)
Creatinine, Ser: 1.28 mg/dL — ABNORMAL HIGH (ref 0.50–1.10)
Glucose, Bld: 91 mg/dL (ref 70–99)
Potassium: 3.5 mEq/L (ref 3.5–5.3)
Sodium: 139 mEq/L (ref 135–145)
Total Bilirubin: 0.4 mg/dL (ref 0.3–1.2)
Total Protein: 6.4 g/dL (ref 6.0–8.3)

## 2011-02-22 MED ORDER — FAMOTIDINE IN NACL 20-0.9 MG/50ML-% IV SOLN
20.0000 mg | Freq: Once | INTRAVENOUS | Status: AC
  Administered 2011-02-22: 20 mg via INTRAVENOUS

## 2011-02-22 MED ORDER — HEPARIN SOD (PORK) LOCK FLUSH 100 UNIT/ML IV SOLN
500.0000 [IU] | Freq: Once | INTRAVENOUS | Status: DC | PRN
  Filled 2011-02-22: qty 5

## 2011-02-22 MED ORDER — SODIUM CHLORIDE 0.9 % IV SOLN
Freq: Once | INTRAVENOUS | Status: AC
  Administered 2011-02-22: 12:00:00 via INTRAVENOUS

## 2011-02-22 MED ORDER — DEXAMETHASONE SODIUM PHOSPHATE 4 MG/ML IJ SOLN
20.0000 mg | Freq: Once | INTRAMUSCULAR | Status: AC
  Administered 2011-02-22: 20 mg via INTRAVENOUS

## 2011-02-22 MED ORDER — PACLITAXEL CHEMO INJECTION 300 MG/50ML
45.0000 mg/m2 | Freq: Once | INTRAVENOUS | Status: AC
  Administered 2011-02-22: 96 mg via INTRAVENOUS
  Filled 2011-02-22: qty 16

## 2011-02-22 MED ORDER — DIPHENHYDRAMINE HCL 50 MG/ML IJ SOLN
50.0000 mg | Freq: Once | INTRAMUSCULAR | Status: AC
  Administered 2011-02-22: 50 mg via INTRAVENOUS

## 2011-02-22 MED ORDER — SODIUM CHLORIDE 0.9 % IV SOLN
234.8000 mg | Freq: Once | INTRAVENOUS | Status: AC
  Administered 2011-02-22: 230 mg via INTRAVENOUS
  Filled 2011-02-22: qty 23

## 2011-02-22 MED ORDER — ONDANSETRON 16 MG/50ML IVPB (CHCC)
16.0000 mg | Freq: Once | INTRAVENOUS | Status: AC
  Administered 2011-02-22: 16 mg via INTRAVENOUS

## 2011-02-22 MED ORDER — SODIUM CHLORIDE 0.9 % IJ SOLN
10.0000 mL | INTRAMUSCULAR | Status: DC | PRN
  Filled 2011-02-22: qty 10

## 2011-02-23 ENCOUNTER — Ambulatory Visit
Admission: RE | Admit: 2011-02-23 | Discharge: 2011-02-23 | Disposition: A | Discharge status: Home / Self Care | Payer: Medicare Other | Source: Ambulatory Visit | Attending: Radiation Oncology | Admitting: Radiation Oncology

## 2011-02-24 ENCOUNTER — Ambulatory Visit
Admission: RE | Admit: 2011-02-24 | Discharge: 2011-02-24 | Disposition: A | Discharge status: Home / Self Care | Payer: Medicare Other | Source: Ambulatory Visit | Attending: Radiation Oncology | Admitting: Radiation Oncology

## 2011-02-24 ENCOUNTER — Encounter: Payer: Self-pay | Admitting: Radiation Oncology

## 2011-02-24 DIAGNOSIS — C3490 Malignant neoplasm of unspecified part of unspecified bronchus or lung: Secondary | ICD-10-CM

## 2011-02-24 NOTE — Progress Notes (Signed)
Patient presents to the clinic today accompanied by her daughter for under treat visit with Dr. Mitzi Hansen. Patient drowsy today. Patient reports recently taking her pain medication. Blood pressure 99/66 related to the effect of pain medication. Patient reports constant burning low back pain 10 on a scale . Patient oriented to person, place, and time. No distress noted. Slow steady gait noted. Flat affect noted.

## 2011-02-25 ENCOUNTER — Ambulatory Visit
Admission: RE | Admit: 2011-02-25 | Discharge: 2011-02-25 | Disposition: A | Discharge status: Home / Self Care | Payer: Medicare Other | Source: Ambulatory Visit | Attending: Radiation Oncology | Admitting: Radiation Oncology

## 2011-02-28 ENCOUNTER — Ambulatory Visit
Admission: RE | Admit: 2011-02-28 | Discharge: 2011-02-28 | Disposition: A | Discharge status: Home / Self Care | Payer: Medicare Other | Source: Ambulatory Visit | Attending: Radiation Oncology | Admitting: Radiation Oncology

## 2011-03-01 ENCOUNTER — Other Ambulatory Visit: Payer: Self-pay | Admitting: Internal Medicine

## 2011-03-01 ENCOUNTER — Ambulatory Visit
Admission: RE | Admit: 2011-03-01 | Discharge: 2011-03-01 | Disposition: A | Discharge status: Home / Self Care | Payer: Medicare Other | Source: Ambulatory Visit | Attending: Radiation Oncology | Admitting: Radiation Oncology

## 2011-03-01 ENCOUNTER — Other Ambulatory Visit: Payer: Self-pay | Admitting: Certified Registered Nurse Anesthetist

## 2011-03-01 ENCOUNTER — Other Ambulatory Visit (HOSPITAL_BASED_OUTPATIENT_CLINIC_OR_DEPARTMENT_OTHER): Payer: Medicare Other | Admitting: Lab

## 2011-03-01 ENCOUNTER — Other Ambulatory Visit: Payer: Self-pay | Admitting: *Deleted

## 2011-03-01 ENCOUNTER — Ambulatory Visit (HOSPITAL_BASED_OUTPATIENT_CLINIC_OR_DEPARTMENT_OTHER): Payer: Medicare Other

## 2011-03-01 VITALS — BP 95/64 | HR 62 | Temp 98.2°F

## 2011-03-01 DIAGNOSIS — C3490 Malignant neoplasm of unspecified part of unspecified bronchus or lung: Secondary | ICD-10-CM

## 2011-03-01 DIAGNOSIS — C3410 Malignant neoplasm of upper lobe, unspecified bronchus or lung: Secondary | ICD-10-CM

## 2011-03-01 DIAGNOSIS — Z5111 Encounter for antineoplastic chemotherapy: Secondary | ICD-10-CM

## 2011-03-01 LAB — CBC WITH DIFFERENTIAL/PLATELET
BASO%: 0.4 % (ref 0.0–2.0)
Basophils Absolute: 0 10*3/uL (ref 0.0–0.1)
EOS%: 6.6 % (ref 0.0–7.0)
Eosinophils Absolute: 0.3 10*3/uL (ref 0.0–0.5)
HCT: 41.2 % (ref 34.8–46.6)
HGB: 14.1 g/dL (ref 11.6–15.9)
LYMPH%: 16.5 % (ref 14.0–49.7)
MCH: 30.7 pg (ref 25.1–34.0)
MCHC: 34.2 g/dL (ref 31.5–36.0)
MCV: 89.8 fL (ref 79.5–101.0)
MONO#: 0.4 10*3/uL (ref 0.1–0.9)
MONO%: 7.2 % (ref 0.0–14.0)
NEUT#: 3.5 10*3/uL (ref 1.5–6.5)
NEUT%: 69.3 % (ref 38.4–76.8)
Platelets: 198 10*3/uL (ref 145–400)
RBC: 4.59 10*6/uL (ref 3.70–5.45)
RDW: 13.6 % (ref 11.2–14.5)
WBC: 5 10*3/uL (ref 3.9–10.3)
lymph#: 0.8 10*3/uL — ABNORMAL LOW (ref 0.9–3.3)
nRBC: 0 % (ref 0–0)

## 2011-03-01 LAB — COMPREHENSIVE METABOLIC PANEL
ALT: 12 U/L (ref 0–35)
AST: 13 U/L (ref 0–37)
Albumin: 3.8 g/dL (ref 3.5–5.2)
Alkaline Phosphatase: 37 U/L — ABNORMAL LOW (ref 39–117)
BUN: 19 mg/dL (ref 6–23)
CO2: 33 mEq/L — ABNORMAL HIGH (ref 19–32)
Calcium: 8.7 mg/dL (ref 8.4–10.5)
Chloride: 100 mEq/L (ref 96–112)
Creatinine, Ser: 1.36 mg/dL — ABNORMAL HIGH (ref 0.50–1.10)
Glucose, Bld: 90 mg/dL (ref 70–99)
Potassium: 3.4 mEq/L — ABNORMAL LOW (ref 3.5–5.3)
Sodium: 139 mEq/L (ref 135–145)
Total Bilirubin: 0.7 mg/dL (ref 0.3–1.2)
Total Protein: 6.2 g/dL (ref 6.0–8.3)

## 2011-03-01 MED ORDER — SODIUM CHLORIDE 0.9 % IV SOLN
234.8000 mg | Freq: Once | INTRAVENOUS | Status: AC
  Administered 2011-03-01: 230 mg via INTRAVENOUS
  Filled 2011-03-01: qty 23

## 2011-03-01 MED ORDER — DEXAMETHASONE SODIUM PHOSPHATE 4 MG/ML IJ SOLN
20.0000 mg | Freq: Once | INTRAMUSCULAR | Status: AC
  Administered 2011-03-01: 20 mg via INTRAVENOUS

## 2011-03-01 MED ORDER — ONDANSETRON 16 MG/50ML IVPB (CHCC)
16.0000 mg | Freq: Once | INTRAVENOUS | Status: AC
  Administered 2011-03-01: 16 mg via INTRAVENOUS

## 2011-03-01 MED ORDER — SODIUM CHLORIDE 0.9 % IV SOLN
Freq: Once | INTRAVENOUS | Status: AC
  Administered 2011-03-01: 12:00:00 via INTRAVENOUS

## 2011-03-01 MED ORDER — FAMOTIDINE IN NACL 20-0.9 MG/50ML-% IV SOLN
20.0000 mg | Freq: Once | INTRAVENOUS | Status: AC
  Administered 2011-03-01: 20 mg via INTRAVENOUS

## 2011-03-01 MED ORDER — PACLITAXEL CHEMO INJECTION 300 MG/50ML
45.0000 mg/m2 | Freq: Once | INTRAVENOUS | Status: AC
  Administered 2011-03-01: 96 mg via INTRAVENOUS
  Filled 2011-03-01: qty 16

## 2011-03-01 MED ORDER — DIPHENHYDRAMINE HCL 50 MG/ML IJ SOLN: 50.0000 mg | Freq: Once | INTRAMUSCULAR | Status: DC

## 2011-03-01 MED ORDER — LIDOCAINE-PRILOCAINE 2.5-2.5 % EX CREA: TOPICAL_CREAM | CUTANEOUS | Status: DC | PRN

## 2011-03-02 ENCOUNTER — Ambulatory Visit
Admission: RE | Admit: 2011-03-02 | Discharge: 2011-03-02 | Disposition: A | Discharge status: Home / Self Care | Payer: Medicare Other | Source: Ambulatory Visit | Attending: Radiation Oncology | Admitting: Radiation Oncology

## 2011-03-03 ENCOUNTER — Encounter: Payer: Self-pay | Admitting: Radiation Oncology

## 2011-03-03 ENCOUNTER — Ambulatory Visit
Admission: RE | Admit: 2011-03-03 | Discharge: 2011-03-03 | Disposition: A | Discharge status: Home / Self Care | Payer: Medicare Other | Source: Ambulatory Visit | Attending: Radiation Oncology | Admitting: Radiation Oncology

## 2011-03-03 DIAGNOSIS — C3490 Malignant neoplasm of unspecified part of unspecified bronchus or lung: Secondary | ICD-10-CM

## 2011-03-03 MED ORDER — RADIAPLEXRX EX GEL
Freq: Once | CUTANEOUS | Status: AC
  Administered 2011-03-03: 11:00:00 via TOPICAL

## 2011-03-03 NOTE — Progress Notes (Signed)
Patient presents to the clinic today accompanied by her son, Theresa [name], for under treat visit with Dr. [name]. Patient drowsy today. Patient reports recently taking her pain medication. Patient reports constant burning low back pain 7 on a scale of 0-10 . Patient oriented to person, place, and time. No distress noted. Slow steady gait noted. Flat affect noted. Patient reports that she has not had a bowel movement in over a week. Instructed patient on constipation management. Patient verbalized understanding. Provided patient with another tube of Radiaplex since she completed the first one. Reinforced skin care. Patient verbalized understanding.

## 2011-03-04 ENCOUNTER — Ambulatory Visit
Admission: RE | Admit: 2011-03-04 | Discharge: 2011-03-04 | Disposition: A | Discharge status: Home / Self Care | Payer: Medicare Other | Source: Ambulatory Visit | Attending: Radiation Oncology | Admitting: Radiation Oncology

## 2011-03-04 NOTE — Progress Notes (Signed)
[facility] Radiation Oncology Weekly Treatment Note    Name: Theresa [name] Date: 03/04/2011 MRN: [id number] DOB: 1966/02/11  Status: outpatient    Current dose: 5000  Current fraction:25  Planned dose:6600  Planned fraction:33   MEDICATIONS: Current Outpatient Prescriptions  Medication Sig Dispense Refill  . albuterol (VENTOLIN HFA) 108 (90 BASE) MCG/ACT inhaler Inhale 2 puffs into the lungs every 4 (four) hours as needed.  2 Inhaler  5  . amLODipine (NORVASC) 10 MG tablet Take 1 tablet (10 mg total) by mouth daily.  90 tablet  3  . betamethasone dipropionate (DIPROLENE) 0.05 % ointment Apply topically 3 (three) times daily. Dispense 15 gram tube       . carvedilol (COREG) 25 MG tablet Take 1 tablet (25 mg total) by mouth 2 (two) times daily.  60 tablet  11  . fluticasone (FLONASE) 50 MCG/ACT nasal spray 1 spray by Nasal route daily.        Marland Kitchen HYDROmorphone (DILAUDID) 2 MG tablet Take 2 mg by mouth every 4 (four) hours as needed. Take 1-2 tabs q4-6 hr prn pain, disp 40, 0 refill       . lidocaine-prilocaine (EMLA) cream Apply topically as needed. Apply to port 1 hour before chemo.  30 g  0  . ondansetron (ZOFRAN) 8 MG tablet Take 8 mg by mouth every 12 (twelve) hours as needed. One po q12h prn nausea qty 30 no refills       . prochlorperazine (COMPAZINE) 10 MG tablet Take 10 mg by mouth every 6 (six) hours as needed. One po every six hours prn nausea; qty 60; no refills      . propranolol (INDERAL LA) 80 MG 24 hr capsule Take 1 capsule (80 mg total) by mouth daily.  30 capsule  11  . valsartan-hydrochlorothiazide (DIOVAN-HCT) 160-12.5 MG per tablet Take 1 tablet by mouth daily.  90 tablet  3  . varenicline (CHANTIX) 0.5 MG tablet Take 2 tablets (1 mg total) by mouth 2 (two) times daily. With meals.  60 tablet  2  . HYDROmorphone (DILAUDID) 2 MG tablet Take 2 mg by mouth every 4 (four) hours as needed. One po every 4-6 hours prn pain; qty 90; no refills        Current  Facility-Administered Medications  Medication Dose Route Frequency Provider Last Rate Last Dose  . hyaluronate sodium (RADIAPLEXRX) gel   Topical Once Jonna Coup, MD         ALLERGIES: Morphine and related; Codeine; and Penicillins   LABORATORY DATA:  Lab Results  Component Value Date   WBC 5.0 03/01/2011   HGB 14.1 03/01/2011   HCT 41.2 03/01/2011   MCV 89.8 03/01/2011   PLT 198 03/01/2011   Lab Results  Component Value Date   NA 139 03/01/2011   K 3.4* 03/01/2011   CL 100 03/01/2011   CO2 33* 03/01/2011   Lab Results  Component Value Date   ALT 12 03/01/2011   AST 13 03/01/2011   ALKPHOS 37* 03/01/2011   BILITOT 0.7 03/01/2011      NARRATIVE: Theresa [name] was seen today for weekly treatment management. The chart was checked and CBCT images were reviewed.  No major changes. Pt drowsy today. Having constipation.  PHYSICAL EXAMINATION: weight is 228 lb 14.4 oz (103.828 kg). Her blood pressure is 110/74 and her pulse is 68.       ASSESSMENT: Patient tolerating treatments well.    PLAN: Continue treatment as planned.  Discussed methods for constipation.

## 2011-03-07 ENCOUNTER — Ambulatory Visit
Admission: RE | Admit: 2011-03-07 | Discharge: 2011-03-07 | Disposition: A | Discharge status: Home / Self Care | Payer: Medicare Other | Source: Ambulatory Visit | Attending: Radiation Oncology | Admitting: Radiation Oncology

## 2011-03-09 ENCOUNTER — Ambulatory Visit
Admission: RE | Admit: 2011-03-09 | Discharge: 2011-03-09 | Disposition: A | Discharge status: Home / Self Care | Payer: Medicare Other | Source: Ambulatory Visit | Attending: Radiation Oncology | Admitting: Radiation Oncology

## 2011-03-10 ENCOUNTER — Ambulatory Visit
Admission: RE | Admit: 2011-03-10 | Discharge: 2011-03-10 | Disposition: A | Discharge status: Home / Self Care | Payer: Medicare Other | Source: Ambulatory Visit | Attending: Radiation Oncology | Admitting: Radiation Oncology

## 2011-03-11 ENCOUNTER — Ambulatory Visit
Admission: RE | Admit: 2011-03-11 | Discharge: 2011-03-11 | Disposition: A | Discharge status: Home / Self Care | Payer: Medicare Other | Source: Ambulatory Visit | Attending: Radiation Oncology | Admitting: Radiation Oncology

## 2011-03-11 ENCOUNTER — Encounter: Payer: Self-pay | Admitting: Radiation Oncology

## 2011-03-11 DIAGNOSIS — C3490 Malignant neoplasm of unspecified part of unspecified bronchus or lung: Secondary | ICD-10-CM

## 2011-03-11 MED ORDER — SUCRALFATE 1 GM/10ML PO SUSP: 1.0000 g | Freq: Three times a day (TID) | ORAL | Status: DC

## 2011-03-11 NOTE — Progress Notes (Signed)
  Radiation Oncology         [phone] ________________________________  Name: Theresa [name] MRN: [id number]  Date: 03/11/2011  DOB: [birth year]  Weekly Radiation Therapy Management  Current Dose: 58 Gy     Planned Dose:  66 Gy  Narrative . . . . . . . . The patient presents for routine under treatment assessment.                                                        She has esophageal discomfort with swallowing                                 Set-up films were reviewed.                                 The chart was checked. Physical Findings. . . Weight essentially stable.  No significant changes. Impression . . . . . . . The patient is  tolerating radiation. Plan . . . . . . . . . . . . Continue treatment as planned.  Started carafate.  ________________________________  Artist Pais Kathrynn Running, M.D.

## 2011-03-11 NOTE — Progress Notes (Signed)
Patient presents to the clinic today accompanied by her daughter in law for under treat visit with Dr. Kathrynn Running. Patient is alert and oriented to person, place, and time. No distress noted. Steady gait noted. Pleasant affect noted. Patient reports constant aching chest pain 9 on a scale 0-10. Patient reports a persistent cough with occasional blood tinged sputum. Patient reports throat pain. Patient states,"it feels like my food is getting stuck." Patient reports using Radiaplex to treated skin three times a day. Patient reports that following an enema her constipation was relieve.

## 2011-03-14 ENCOUNTER — Ambulatory Visit: Payer: Medicare Other

## 2011-03-16 ENCOUNTER — Ambulatory Visit
Admission: RE | Admit: 2011-03-16 | Discharge: 2011-03-16 | Disposition: A | Discharge status: Home / Self Care | Payer: Medicare Other | Source: Ambulatory Visit | Attending: Radiation Oncology | Admitting: Radiation Oncology

## 2011-03-17 ENCOUNTER — Ambulatory Visit
Admission: RE | Admit: 2011-03-17 | Discharge: 2011-03-17 | Disposition: A | Discharge status: Home / Self Care | Payer: Medicare Other | Source: Ambulatory Visit | Attending: Radiation Oncology | Admitting: Radiation Oncology

## 2011-03-17 DIAGNOSIS — C3490 Malignant neoplasm of unspecified part of unspecified bronchus or lung: Secondary | ICD-10-CM

## 2011-03-17 NOTE — Progress Notes (Signed)
[facility] Radiation Oncology Weekly Treatment Note    Name: PAISLYNN HEGSTROM Date: 03/17/2011 MRN: [id number] DOB: 31-Jan-1966  Status: outpatient    Current dose: 6400  Current fraction:32  Planned dose:6600  Planned fraction:33   MEDICATIONS: Current Outpatient Prescriptions  Medication Sig Dispense Refill  . albuterol (VENTOLIN HFA) 108 (90 BASE) MCG/ACT inhaler Inhale 2 puffs into the lungs every 4 (four) hours as needed.  2 Inhaler  5  . amLODipine (NORVASC) 10 MG tablet Take 1 tablet (10 mg total) by mouth daily.  90 tablet  3  . betamethasone dipropionate (DIPROLENE) 0.05 % ointment Apply topically 3 (three) times daily. Dispense 15 gram tube       . carvedilol (COREG) 25 MG tablet Take 1 tablet (25 mg total) by mouth 2 (two) times daily.  60 tablet  11  . fluticasone (FLONASE) 50 MCG/ACT nasal spray 1 spray by Nasal route daily.        Marland Kitchen HYDROmorphone (DILAUDID) 2 MG tablet Take 2 mg by mouth every 4 (four) hours as needed. Take 1-2 tabs q4-6 hr prn pain, disp 40, 0 refill       . HYDROmorphone (DILAUDID) 2 MG tablet Take 2 mg by mouth every 4 (four) hours as needed. One po every 4-6 hours prn pain; qty 90; no refills       . lidocaine-prilocaine (EMLA) cream Apply topically as needed. Apply to port 1 hour before chemo.  30 g  0  . ondansetron (ZOFRAN) 8 MG tablet Take 8 mg by mouth every 12 (twelve) hours as needed. One po q12h prn nausea qty 30 no refills       . prochlorperazine (COMPAZINE) 10 MG tablet Take 10 mg by mouth every 6 (six) hours as needed. One po every six hours prn nausea; qty 60; no refills      . propranolol (INDERAL LA) 80 MG 24 hr capsule Take 1 capsule (80 mg total) by mouth daily.  30 capsule  11  . sucralfate (CARAFATE) 1 GM/10ML suspension Take 10 mLs (1 g total) by mouth 4 (four) times daily -  with meals and at bedtime.  420 mL  0  . valsartan-hydrochlorothiazide (DIOVAN-HCT) 160-12.5 MG per tablet Take 1 tablet by mouth daily.  90  tablet  3  . varenicline (CHANTIX) 0.5 MG tablet Take 2 tablets (1 mg total) by mouth 2 (two) times daily. With meals.  60 tablet  2     ALLERGIES: Morphine and related; Codeine; and Penicillins   LABORATORY DATA:  Lab Results  Component Value Date   WBC 5.0 03/01/2011   HGB 14.1 03/01/2011   HCT 41.2 03/01/2011   MCV 89.8 03/01/2011   PLT 198 03/01/2011   Lab Results  Component Value Date   NA 139 03/01/2011   K 3.4* 03/01/2011   CL 100 03/01/2011   CO2 33* 03/01/2011   Lab Results  Component Value Date   ALT 12 03/01/2011   AST 13 03/01/2011   ALKPHOS 37* 03/01/2011   BILITOT 0.7 03/01/2011      NARRATIVE: MENA SIMONIS was seen today for weekly treatment management. The chart was checked and port films images were reviewed. Pt doing ok. Continued cough - lost cough med so needs refill.  PHYSICAL EXAMINATION: weight is 224 lb 12.8 oz (101.969 kg).      ASSESSMENT: Patient tolerating treatments well.    PLAN: Continue treatment as planned.

## 2011-03-17 NOTE — Progress Notes (Signed)
Pt completes tomorrow, has FU card.  Pt states she coughs but sputum too thick to cough up and sometimes coughing causes her to get SOB. States she lost cough med script Dr Mitzi Hansen gave her; will ask for a new script. Pt has some loss of appetite but has not lost wgt in past week.  Pt states pain level high today due to positioning on table for xrt.  Pt's skin upper L back w/brisk erythema , dry desquamation, no sx infection noted. Pt applying radiaplex 3x/day, will begin Neosporin.

## 2011-03-18 ENCOUNTER — Ambulatory Visit
Admission: RE | Admit: 2011-03-18 | Discharge: 2011-03-18 | Disposition: A | Discharge status: Home / Self Care | Payer: Medicare Other | Source: Ambulatory Visit | Attending: Radiation Oncology | Admitting: Radiation Oncology

## 2011-03-18 ENCOUNTER — Encounter: Payer: Self-pay | Admitting: Radiation Oncology

## 2011-03-22 ENCOUNTER — Telehealth: Payer: Self-pay | Admitting: *Deleted

## 2011-03-22 ENCOUNTER — Telehealth: Payer: Self-pay | Admitting: Internal Medicine

## 2011-03-22 NOTE — Telephone Encounter (Signed)
Theresa [name] at Dr Magrinat's  received a call from Theresa [name] asking if she will be getting any more chemo. I returned pts call and  told her that I would f/u with Dr Donnald Garre with her chemo question.  She confirmed her f/u appt with Dr Donnald Garre on jan 31st. She will need a port flush also.

## 2011-03-23 ENCOUNTER — Telehealth: Payer: Self-pay | Admitting: Internal Medicine

## 2011-03-23 ENCOUNTER — Other Ambulatory Visit: Payer: Self-pay | Admitting: Internal Medicine

## 2011-03-23 DIAGNOSIS — C3490 Malignant neoplasm of unspecified part of unspecified bronchus or lung: Secondary | ICD-10-CM

## 2011-03-23 NOTE — Telephone Encounter (Signed)
larry called -requests appointment sooner than the 31st per orders Dr Donnald Garre order Ct scan and f/u. Orders placed and scheduling request submitted.

## 2011-03-28 ENCOUNTER — Other Ambulatory Visit: Payer: Self-pay | Admitting: *Deleted

## 2011-03-28 ENCOUNTER — Other Ambulatory Visit: Payer: Self-pay | Admitting: Internal Medicine

## 2011-03-28 ENCOUNTER — Telehealth: Payer: Self-pay | Admitting: Internal Medicine

## 2011-03-28 DIAGNOSIS — C3490 Malignant neoplasm of unspecified part of unspecified bronchus or lung: Secondary | ICD-10-CM

## 2011-03-28 NOTE — Telephone Encounter (Signed)
pt had called stephanie j re:lab appt prior to ct.  lab added to appt & pt was called,pt also given md visit     aom

## 2011-03-31 ENCOUNTER — Encounter: Payer: Self-pay | Admitting: *Deleted

## 2011-04-06 ENCOUNTER — Ambulatory Visit: Payer: Medicare Other

## 2011-04-06 ENCOUNTER — Other Ambulatory Visit (HOSPITAL_BASED_OUTPATIENT_CLINIC_OR_DEPARTMENT_OTHER): Payer: Medicare Other | Admitting: Lab

## 2011-04-06 ENCOUNTER — Encounter (HOSPITAL_COMMUNITY): Payer: Self-pay

## 2011-04-06 ENCOUNTER — Ambulatory Visit (HOSPITAL_COMMUNITY)
Admission: RE | Admit: 2011-04-06 | Discharge: 2011-04-06 | Disposition: A | Discharge status: Home / Self Care | Payer: Medicare Other | Source: Ambulatory Visit | Attending: Internal Medicine | Admitting: Internal Medicine

## 2011-04-06 DIAGNOSIS — R059 Cough, unspecified: Secondary | ICD-10-CM | POA: Insufficient documentation

## 2011-04-06 DIAGNOSIS — R0602 Shortness of breath: Secondary | ICD-10-CM | POA: Insufficient documentation

## 2011-04-06 DIAGNOSIS — R05 Cough: Secondary | ICD-10-CM | POA: Insufficient documentation

## 2011-04-06 DIAGNOSIS — C3490 Malignant neoplasm of unspecified part of unspecified bronchus or lung: Secondary | ICD-10-CM

## 2011-04-06 DIAGNOSIS — C3410 Malignant neoplasm of upper lobe, unspecified bronchus or lung: Secondary | ICD-10-CM

## 2011-04-06 LAB — CBC WITH DIFFERENTIAL/PLATELET
BASO%: 0.1 % (ref 0.0–2.0)
Basophils Absolute: 0 10*3/uL (ref 0.0–0.1)
EOS%: 3 % (ref 0.0–7.0)
Eosinophils Absolute: 0.2 10*3/uL (ref 0.0–0.5)
HCT: 45.6 % (ref 34.8–46.6)
HGB: 15.4 g/dL (ref 11.6–15.9)
LYMPH%: 12 % — ABNORMAL LOW (ref 14.0–49.7)
MCH: 31.4 pg (ref 25.1–34.0)
MCHC: 33.7 g/dL (ref 31.5–36.0)
MCV: 93.1 fL (ref 79.5–101.0)
MONO#: 0.6 10*3/uL (ref 0.1–0.9)
MONO%: 7.9 % (ref 0.0–14.0)
NEUT#: 6.3 10*3/uL (ref 1.5–6.5)
NEUT%: 77 % — ABNORMAL HIGH (ref 38.4–76.8)
Platelets: 159 10*3/uL (ref 145–400)
RBC: 4.9 10*6/uL (ref 3.70–5.45)
RDW: 15 % — ABNORMAL HIGH (ref 11.2–14.5)
WBC: 8.1 10*3/uL (ref 3.9–10.3)
lymph#: 1 10*3/uL (ref 0.9–3.3)

## 2011-04-06 LAB — CMP (CANCER CENTER ONLY)
ALT(SGPT): 19 U/L (ref 10–47)
AST: 20 U/L (ref 11–38)
Albumin: 4.1 g/dL (ref 3.3–5.5)
Alkaline Phosphatase: 46 U/L (ref 26–84)
BUN, Bld: 9 mg/dL (ref 7–22)
CO2: 28 mEq/L (ref 18–33)
Calcium: 8.7 mg/dL (ref 8.0–10.3)
Chloride: 105 mEq/L (ref 98–108)
Creat: 1.2 mg/dl (ref 0.6–1.2)
Glucose, Bld: 101 mg/dL (ref 73–118)
Potassium: 3.7 mEq/L (ref 3.3–4.7)
Sodium: 140 mEq/L (ref 128–145)
Total Bilirubin: 0.9 mg/dl (ref 0.20–1.60)
Total Protein: 7.4 g/dL (ref 6.4–8.1)

## 2011-04-06 MED ORDER — HEPARIN SOD (PORK) LOCK FLUSH 100 UNIT/ML IV SOLN
500.0000 [IU] | Freq: Once | INTRAVENOUS | Status: AC
  Administered 2011-04-06: 500 [IU] via INTRAVENOUS
  Filled 2011-04-06: qty 5

## 2011-04-06 MED ORDER — SODIUM CHLORIDE 0.9 % IJ SOLN
10.0000 mL | INTRAMUSCULAR | Status: DC | PRN
  Administered 2011-04-06: 10 mL via INTRAVENOUS
  Filled 2011-04-06: qty 10

## 2011-04-06 MED ORDER — IOHEXOL 300 MG/ML  SOLN
80.0000 mL | Freq: Once | INTRAMUSCULAR | Status: AC | PRN
  Administered 2011-04-06: 80 mL via INTRAVENOUS

## 2011-04-06 NOTE — Progress Notes (Signed)
Labs drawn via port - pt tolerated well . Pt to radiology via wheelchair accompanied by son.

## 2011-04-12 ENCOUNTER — Encounter: Payer: Self-pay | Admitting: *Deleted

## 2011-04-12 NOTE — Progress Notes (Signed)
Received fax from [employer] requesting a physician's statement for short term disability.  Form given to [facility] in medical mgmt for review.  SLJ

## 2011-04-14 ENCOUNTER — Encounter: Payer: Self-pay | Admitting: Internal Medicine

## 2011-04-14 ENCOUNTER — Ambulatory Visit (HOSPITAL_BASED_OUTPATIENT_CLINIC_OR_DEPARTMENT_OTHER): Payer: Medicare Other | Admitting: Internal Medicine

## 2011-04-14 VITALS — BP 134/92 | HR 88 | Temp 99.2°F | Ht 63.0 in | Wt 214.8 lb

## 2011-04-14 DIAGNOSIS — C3490 Malignant neoplasm of unspecified part of unspecified bronchus or lung: Secondary | ICD-10-CM

## 2011-04-14 MED ORDER — BENZONATATE 100 MG PO CAPS: 100.0000 mg | ORAL_CAPSULE | Freq: Three times a day (TID) | ORAL | Status: AC | PRN

## 2011-04-14 NOTE — Progress Notes (Signed)
[facility] OFFICE PROGRESS Cameron Sprang, MD, MD [address] [place] [address]  DIAGNOSIS: Recurrent non-small cell lung cancer initially diagnosed as stage IA (T1b, N0, MX) adenocarcinoma in November 2009.   PRIOR THERAPY:  #1 Status post left upper lobe trisegmentectomy with lymph node dissection under the care of Dr. Edwyna Shell on March 24, 2009.  #2 Concurrent chemoradiation with carboplatin for AUC of 2 and paclitaxel 45 mg/M2 given weekly with radiation. CURRENT THERAPY: Observation.  INTERVAL HISTORY: Theresa [name] 46 y.o. female returns to the clinic today for a followup visit accompanied her husband. The patient is feeling fine today except for dry cough. She denied having any significant chest pain or shortness of breath. No weight loss or night sweats. She tolerated the previous course of concurrent chemoradiation fairly well. The patient has repeat CT scan of the chest performed recently and she is here today for evaluation and discussion of her scan results.  MEDICAL HISTORY: Past Medical History  Diagnosis Date  . Hypertension   . Hyperlipidemia   . Blindness, legal     glaucoma and retinitis  pigmentosa  . Glaucoma   . Onychomycosis   . Retinitis pigmentosa   . Chronic eczema   . Chronic headaches   . Lung cancer     lung ca dx 2010  . Diabetes mellitus type II     diet controlled  . Fibromyalgia   . Anxiety     ALLERGIES:  is allergic to morphine and related; codeine; and penicillins.  MEDICATIONS:  Current Outpatient Prescriptions  Medication Sig Dispense Refill  . albuterol (VENTOLIN HFA) 108 (90 BASE) MCG/ACT inhaler Inhale 2 puffs into the lungs every 4 (four) hours as needed.  2 Inhaler  5  . amLODipine (NORVASC) 10 MG tablet Take 1 tablet (10 mg total) by mouth daily.  90 tablet  3  . betamethasone dipropionate (DIPROLENE) 0.05 % ointment Apply topically 3 (three) times daily. Dispense 15 gram tube        . carvedilol (COREG) 25 MG tablet Take 1 tablet (25 mg total) by mouth 2 (two) times daily.  60 tablet  11  . fluticasone (FLONASE) 50 MCG/ACT nasal spray 1 spray by Nasal route daily.        Marland Kitchen lidocaine-prilocaine (EMLA) cream Apply topically as needed. Apply to port 1 hour before chemo.  30 g  0  . ondansetron (ZOFRAN) 8 MG tablet Take 8 mg by mouth every 12 (twelve) hours as needed. One po q12h prn nausea qty 30 no refills       . prochlorperazine (COMPAZINE) 10 MG tablet Take 10 mg by mouth every 6 (six) hours as needed. One po every six hours prn nausea; qty 60; no refills      . valsartan-hydrochlorothiazide (DIOVAN-HCT) 160-12.5 MG per tablet Take 1 tablet by mouth daily.  90 tablet  3  . varenicline (CHANTIX) 0.5 MG tablet Take 2 tablets (1 mg total) by mouth 2 (two) times daily. With meals.  60 tablet  2  . benzonatate (TESSALON) 100 MG capsule Take 1 capsule (100 mg total) by mouth 3 (three) times daily as needed for cough.  30 capsule  0  . HYDROmorphone (DILAUDID) 2 MG tablet Take 2 mg by mouth every 4 (four) hours as needed. One po every 4-6 hours prn pain; qty 90; no refills       . sucralfate (CARAFATE) 1 GM/10ML suspension Take 1  g by mouth every 6 (six) hours. 03/31/11 Take (1 gram) by mouth QID with meals and at bedtime- oral.  Take 5 mins before meals. No refills        SURGICAL HISTORY:  Past Surgical History  Procedure Date  . Lung surgery 03/24/08    L vats, L thoracotomy and LUL trisegmentectomy with node dissection  . Chest tube insertion  04/01/08    L hydropneumothorax  . Back surgery   . Eye surgery     lens implant  . Portacath placement 02/11/2011    Procedure: INSERTION PORT-A-CATH;  Surgeon: Norton Blizzard, MD;  Location: [facility] OR;  Service: Thoracic;  Laterality: Right;  9.6Fr. Pre-attached Power Port in Right Internal Jugular    REVIEW OF SYSTEMS:  A comprehensive review of systems was negative except for: Respiratory: positive for cough and dyspnea on  exertion   PHYSICAL EXAMINATION: General appearance: alert, cooperative, appears stated age and no distress Neck: no adenopathy Lymph nodes: Cervical, supraclavicular, and axillary nodes normal. Resp: clear to auscultation bilaterally Cardio: regular rate and rhythm, S1, S2 normal, no murmur, click, rub or gallop GI: soft, non-tender; bowel sounds normal; no masses,  no organomegaly Extremities: extremities normal, atraumatic, no cyanosis or edema Neurologic: Alert and oriented X 3, normal strength and tone. Normal symmetric reflexes. Normal coordination and gait  ECOG PERFORMANCE STATUS: 1 - Symptomatic but completely ambulatory  Blood pressure 134/92, pulse 88, temperature 99.2 F (37.3 C), temperature source Oral, height 5\' 3"  (1.6 m), weight 214 lb 12.8 oz (97.433 kg).  LABORATORY DATA: Lab Results  Component Value Date   WBC 8.1 04/06/2011   HGB 15.4 04/06/2011   HCT 45.6 04/06/2011   MCV 93.1 04/06/2011   PLT 159 04/06/2011      Chemistry      Component Value Date/Time   NA 140 04/06/2011 1205   NA 139 03/01/2011 1034   K 3.7 04/06/2011 1205   K 3.4* 03/01/2011 1034   CL 105 04/06/2011 1205   CL 100 03/01/2011 1034   CO2 28 04/06/2011 1205   CO2 33* 03/01/2011 1034   BUN 9 04/06/2011 1205   BUN 19 03/01/2011 1034   CREATININE 1.2 04/06/2011 1205   CREATININE 1.36* 03/01/2011 1034      Component Value Date/Time   CALCIUM 8.7 04/06/2011 1205   CALCIUM 8.7 03/01/2011 1034   ALKPHOS 46 04/06/2011 1205   ALKPHOS 37* 03/01/2011 1034   AST 20 04/06/2011 1205   AST 13 03/01/2011 1034   ALT 12 03/01/2011 1034   BILITOT 0.90 04/06/2011 1205   BILITOT 0.7 03/01/2011 1034       RADIOGRAPHIC STUDIES: Ct Chest W Contrast  04/06/2011  *RADIOLOGY REPORT*  Clinical Data: Lung cancer.  Radiation therapy completed 12/12. Ongoing chemotherapy.  Left upper lobectomy.  Cough and shortness of breath.  CT CHEST WITH CONTRAST  Technique:  Multidetector CT imaging of the chest was performed  following the standard protocol during bolus administration of intravenous contrast.  Contrast: 80mL OMNIPAQUE IOHEXOL 300 MG/ML IV SOLN  Comparison: Plain film 02/11/2011.  CT of 11/08/2010.  PET of 11/24/2010.  Findings: Lung windows demonstrate surgical changes of at least partial left upper lobectomy.  No evidence of pulmonary parenchymal recurrence.  Surgical sutures with overlying scarring within the remaining left lung.  Example image 20.  Soft tissue windows demonstrate a right-sided Port-A-Cath is angled within the subcutaneous tissues on image 8 of series 2.  This has been placed since the  prior CT.  Catheter terminates at the cavoatrial junction.  Normal heart size, without pericardial or pleural effusion.  There is an area of focal left-sided pleural thickening.  This measures 1.2 cm on image 33 and is unchanged (when remeasured). This is not significantly hypermetabolic on the prior PET.  Stable small paratracheal nodes.  No hilar adenopathy.  The area of recurrent disease just anterior and superior the surgical sutures, the lateral aspect of the AP window station, has undergone response to therapy.  Mild soft tissue thickening in this area on image 20 measures 9 mm.  On the prior exam, the soft tissue mass measured 2.6 x 2.2 cm.  No central pulmonary embolism, on this non-dedicated study.  Limited abdominal imaging demonstrates upper pole left renal lesion, likely a cyst. Normal adrenal glands.  No acute osseous abnormality.  IMPRESSION:  1.  Response to therapy of recurrent disease in the prevascular/lateral AP window region.  Only mild residual soft tissue thickening remains in this area.  No well-defined residual mass. 2.  No other areas of recurrent or metastatic disease. 3.  Area of focal left anterior pleural thickening is unchanged and was not significantly hypermetabolic at PET.  This warrants followup attention. 4.  Interval Port-A-Cath placement.  The port is angled within the subcutaneous  tissues of the chest wall.  Original Report Authenticated By: Consuello Bossier, M.D.    ASSESSMENT: This is a very pleasant 46 years old [place] American female with recurrent non-small cell lung cancer status post concurrent chemoradiation with weekly carboplatin and paclitaxel. The patient has significant improvement in her disease. I discussed the scan results with the patient and her husband.  PLAN: I recommended for her continuous observation for now with repeat CT scan of the chest with contrast in 3 months. The patient come back for followup visit at that time. She would also have Port-A-Cath flush every 6 weeks. She was advised to call me immediately if she has any concerning symptoms in the interval.   All questions were answered. The patient knows to call the clinic with any problems, questions or concerns. We can certainly see the patient much sooner if necessary.

## 2011-04-14 NOTE — Progress Notes (Signed)
Put disability paper on nurse's desk. °

## 2011-04-15 ENCOUNTER — Encounter: Payer: Self-pay | Admitting: Internal Medicine

## 2011-04-15 NOTE — Progress Notes (Signed)
Faxed disability form to [employer] @ [phone].

## 2011-04-19 ENCOUNTER — Encounter: Payer: Self-pay | Admitting: *Deleted

## 2011-04-19 ENCOUNTER — Other Ambulatory Visit: Payer: Self-pay | Admitting: Family Medicine

## 2011-04-19 DIAGNOSIS — Z923 Personal history of irradiation: Secondary | ICD-10-CM | POA: Insufficient documentation

## 2011-04-19 DIAGNOSIS — C3490 Malignant neoplasm of unspecified part of unspecified bronchus or lung: Secondary | ICD-10-CM

## 2011-04-22 ENCOUNTER — Encounter: Payer: Self-pay | Admitting: Radiation Oncology

## 2011-04-22 ENCOUNTER — Ambulatory Visit
Admission: RE | Admit: 2011-04-22 | Discharge: 2011-04-22 | Disposition: A | Discharge status: Home / Self Care | Payer: Medicare Other | Source: Ambulatory Visit | Attending: Radiation Oncology | Admitting: Radiation Oncology

## 2011-04-22 VITALS — BP 119/79 | HR 75 | Temp 99.3°F | Resp 20 | Ht 62.0 in | Wt 212.1 lb

## 2011-04-22 DIAGNOSIS — C3490 Malignant neoplasm of unspecified part of unspecified bronchus or lung: Secondary | ICD-10-CM

## 2011-04-22 NOTE — Progress Notes (Signed)
Pt state she has fever every night, highest known 100.1. She also states she has cough nightly but does not know color due to her limited eyesight.  Nightly chills, feeling cold.  Pt also states she has periods of "feeling like my throat is closed off and I can't breathe, then I start to breath again". This is quite worrisome for pt.

## 2011-04-24 NOTE — Progress Notes (Signed)
CC:   Theresa [name], M.D.  DIAGNOSIS:  Lung cancer.  INTERVAL HISTORY:  Theresa [name] returns to the clinic today for followup. She completed her course of radiation treatment on 03/18/2011.  The patient indicates that she has done relatively well since that time. She denies any significant shortness of breath but states that she has an occasional cough and she has also had what she describes as a low- grade fever, especially at night.  The highest has been 100.1.  The patient had a recent CT scan of the chest with contrast on 04/06/2011 and did discuss this finding with Dr. Arbutus Ped.  This showed response to therapy of the recurrent disease in the prevascular/AP window region. The mild soft tissue thickening in this area measures 9 mm versus 2.6 cm in maximum dimension previously.  She is scheduled for a repeat CT scan of the chest in 3 months with followup with Dr. Arbutus Ped after this.  PHYSICAL EXAMINATION:  Vital Signs:  Weight 212 pounds.  Blood pressure 119/79,  Pulse 75.  Respiratory rate 20.  Temperature 99.3.  General Appearance:  A well-developed female in no acute distress.  Neck: Supple without any lymphadenopathy.  Cardiovascular:  Regular rate and rhythm.  Respiratory:  Clear to auscultation.  IMPRESSION AND PLAN:  Theresa [name] is doing relatively well at this time and did appear to have a good response to treatment on her recent CT scan.  She is denying any ongoing issues with regards to acute toxicity including no significant esophagitis at this time.  She is scheduled for repeat scans and followup with Dr. Arbutus Ped in 3 months.  I will have her return to our clinic in 6 months for ongoing followup.    ______________________________ Radene Gunning, M.D., Ph.D. JSM/MEDQ  D:  04/24/2011  T:  04/24/2011  Job:  [id number]

## 2011-04-25 ENCOUNTER — Encounter: Payer: Self-pay | Admitting: Family Medicine

## 2011-04-25 ENCOUNTER — Ambulatory Visit (INDEPENDENT_AMBULATORY_CARE_PROVIDER_SITE_OTHER): Payer: Medicare Other | Admitting: Family Medicine

## 2011-04-25 VITALS — BP 122/84 | HR 78 | Temp 98.9°F | Ht 62.0 in | Wt 210.0 lb

## 2011-04-25 DIAGNOSIS — R053 Chronic cough: Secondary | ICD-10-CM | POA: Insufficient documentation

## 2011-04-25 DIAGNOSIS — R059 Cough, unspecified: Secondary | ICD-10-CM

## 2011-04-25 DIAGNOSIS — R05 Cough: Secondary | ICD-10-CM

## 2011-04-25 DIAGNOSIS — E119 Type 2 diabetes mellitus without complications: Secondary | ICD-10-CM

## 2011-04-25 DIAGNOSIS — I10 Essential (primary) hypertension: Secondary | ICD-10-CM

## 2011-04-25 LAB — POCT GLYCOSYLATED HEMOGLOBIN (HGB A1C): Hemoglobin A1C: 5.9

## 2011-04-25 MED ORDER — ALBUTEROL SULFATE HFA 108 (90 BASE) MCG/ACT IN AERS: 2.0000 | INHALATION_SPRAY | RESPIRATORY_TRACT | Status: DC | PRN

## 2011-04-25 MED ORDER — BENZONATATE 100 MG PO CAPS: 100.0000 mg | ORAL_CAPSULE | Freq: Three times a day (TID) | ORAL | Status: DC | PRN

## 2011-04-25 MED ORDER — VALSARTAN-HYDROCHLOROTHIAZIDE 160-12.5 MG PO TABS: 1.0000 | ORAL_TABLET | Freq: Every day | ORAL | Status: DC

## 2011-04-25 NOTE — Progress Notes (Signed)
CC:   Ines Bloomer, M.D. Lajuana Matte, M.D. Romero Belling, MD Barbaraann Share, MD,FCCP  DIAGNOSIS:  Recurrent non-small-cell lung cancer.  INDICATION FOR SURGERY:  Curative.  TREATMENT DATES:  01/17/2011 to 03/18/2011.  SITE AND DOSE:  Ms. Monrroy was treated to the gross tumor volume. Initially, to a dose of 60 Gy at 2 Gy per fraction using a 5-field 3-D conformal technique. Each of these fields used 15 MV photons. The patient's treatment then consisted of a boost treatment for an additional 6 Gy, also at 2 Gy per fraction. This used a 3-field cone- down boost technique. These fields also used 15 MV photons. The patient's final total was 66 Gy.  NARRATIVE:  Ms. Cline did satisfactorily during the course of her treatment. Unfortunately, she had to go to [place] during the middle of her treatment to care for a family member, and, therefore, she did suffer from a delay in treatment. It was stressed to her to try to minimize her delay as much as possible, and we did discuss this both before and after her trip. The patient did experience some discomfort in the chest area which was treated with pain medications.  FOLLOWUP APPOINTMENT:  One month.    ______________________________ Radene Gunning, M.D., Ph.D. JSM/MEDQ  D:  04/25/2011  T:  04/25/2011  Job:  [id number]

## 2011-04-25 NOTE — Procedures (Signed)
DIAGNOSIS:  Lung cancer.  NARRATIVE:  Theresa [name] has initially been planned to receive a course of radiation corresponding to 60 Gy at 2 Gy per fraction using a 5-field 3D conformal technique.  She will then receive a boost treatment for an additional 6 Gy in 3 fractions.  This would yield a final total dose of 66 Gy.  For the patient's boost treatment, an additional 3 customized blocks have been designed for this purpose.  A complex isodose plan is requested to ensure that the gross tumor volume within the chest is adequately covered with radiation, and the critical normal structures in close proximity to the gross tumor volume are adequately spared.  Daily image guidance has been ordered for her treatment, due to setup uncertainty, and this will be given on a daily basis during her boost treatment as well to ensure accurate and precise localization of the target and normal structures.    ______________________________ Radene Gunning, M.D., Ph.D. JSM/MEDQ  D:  04/25/2011  T:  04/25/2011  Job:  [id number]

## 2011-04-25 NOTE — Assessment & Plan Note (Signed)
Since October 2012 but worse since finishing radiation/chemotherapy January 2012 for recurrent lung cancer. Now also with sweats and muscle aches.  I am wondering if this is just due to deconditioning. Patient concerned about having to return to work early March. Another possibility is post-nasal drip/cough following viral infection. Will check Flu panel to rule-out flu since patient concerned about this, although would not affect any change in management. Will also check CBC. Recommending cheduled Tessalon to see if it helps cough; patient had nausea with Robitussin and is allergic to codeine.  COPD/Asthma is also possible. Has not used albuterol for about a year. Re-filled today. Consider inhaled glucocorticoid for cough if not improved at follow-up. Patient had slight wheeze today.   Patient appears anxious about having these symptoms for so long. Follow-up in 1 week with PCP.

## 2011-04-25 NOTE — Patient Instructions (Signed)
For the cough:     -Try the Tessalon medication up to three times a day.   -Use the albuterol as needed for shortness of breath.    -Take ibuprofen or Tylenol as needed for the muscle aches.     If your lab results are normal, I will send you a letter with the results. If abnormal, someone at the clinic will get in touch with you.   Follow-up with your regular doctor (Dr. Lula Olszewski) in 1 week.

## 2011-04-25 NOTE — Progress Notes (Signed)
Pt stated that her radiation tx (lung CA) stopped in 1 or 2nd week of January and the chemo stopped in December 2012. The sweating/coughing started around January not to long after the tx stopped. She stated that it was really bad last night and she thought that she would have to call EMS but she is doing better.

## 2011-04-25 NOTE — Progress Notes (Signed)
  Subjective:    Patient ID: Theresa [name], female    DOB: 1966/03/03, 46 y.o.   MRN: [id number]  HPI Cough, sweating, muscle aches   Since October 2012, patient complaining of cough.  She underwent radiation and chemotherapy recently and just finished her chemotherapy December and radiation in January.  Following her last radiation treatment, patient started experiencing worsening cough productive of clear sputum, feeling clammy/sweating all day, and muscle aches. She has not received her flu shot. She her high grade temperatures at home. Last night it was 100.2. This is the highest it has been.  She has also been complaining of shortness-of-breath and vomiting clear liquid after coughing spells.  She still smokes. Last smoked today. Smoking since she was 46 years old. Never diagnosed with asthma or COPD. She has not used her albuterol inhaler for about a year (ran out).  Review of Systems Denies blood in sputum Urinary incontinence at baseline Denies chest pain or palpitations    Objective:   Physical Exam Gen: mild distress; appears ill with frequent coughing spells HEENT: conjunctiva mildly erythematous; no nasal congestion/rhinorrhea; no LAD CV: RRR without m/r/g Pulm: okay aeration, frequent dry coughing spells, occasional expiratory wheeze, no rales, mild occasional ronchi, coarse breath sounds Abd: soft, NT Ext: no edema Skin: clammy    Assessment & Plan:  Deconditioning Asthma? Inhaled glucocorticoid Post-viral? Tessalon, no benadryl Flu test, CBC

## 2011-04-26 ENCOUNTER — Ambulatory Visit: Payer: Medicare Other

## 2011-04-26 LAB — CBC WITH DIFFERENTIAL/PLATELET
Basophils Absolute: 0 10*3/uL (ref 0.0–0.1)
Basophils Relative: 0 % (ref 0–1)
Eosinophils Absolute: 0.3 10*3/uL (ref 0.0–0.7)
Eosinophils Relative: 3 % (ref 0–5)
HCT: 49.1 % — ABNORMAL HIGH (ref 36.0–46.0)
Hemoglobin: 16.2 g/dL — ABNORMAL HIGH (ref 12.0–15.0)
Lymphocytes Relative: 15 % (ref 12–46)
Lymphs Abs: 1.1 10*3/uL (ref 0.7–4.0)
MCH: 30.8 pg (ref 26.0–34.0)
MCHC: 33 g/dL (ref 30.0–36.0)
MCV: 93.3 fL (ref 78.0–100.0)
Monocytes Absolute: 0.5 10*3/uL (ref 0.1–1.0)
Monocytes Relative: 6 % (ref 3–12)
Neutro Abs: 5.7 10*3/uL (ref 1.7–7.7)
Neutrophils Relative %: 75 % (ref 43–77)
Platelets: 235 10*3/uL (ref 150–400)
RBC: 5.26 MIL/uL — ABNORMAL HIGH (ref 3.87–5.11)
RDW: 14.2 % (ref 11.5–15.5)
WBC: 7.6 10*3/uL (ref 4.0–10.5)

## 2011-05-05 NOTE — Telephone Encounter (Signed)
XXX

## 2011-05-08 ENCOUNTER — Other Ambulatory Visit: Payer: Self-pay | Admitting: Family Medicine

## 2011-05-08 NOTE — Telephone Encounter (Signed)
Refill request

## 2011-05-12 ENCOUNTER — Encounter: Payer: Self-pay | Admitting: Family Medicine

## 2011-05-12 ENCOUNTER — Telehealth: Payer: Self-pay | Admitting: Family Medicine

## 2011-05-12 ENCOUNTER — Ambulatory Visit
Admission: RE | Admit: 2011-05-12 | Discharge: 2011-05-12 | Disposition: A | Discharge status: Home / Self Care | Payer: Medicare Other | Source: Ambulatory Visit | Attending: Family Medicine | Admitting: Family Medicine

## 2011-05-12 ENCOUNTER — Ambulatory Visit (INDEPENDENT_AMBULATORY_CARE_PROVIDER_SITE_OTHER): Payer: Medicare Other | Admitting: Family Medicine

## 2011-05-12 VITALS — BP 122/80 | HR 68 | Temp 98.2°F | Ht 62.0 in | Wt 207.0 lb

## 2011-05-12 DIAGNOSIS — R05 Cough: Secondary | ICD-10-CM

## 2011-05-12 DIAGNOSIS — R059 Cough, unspecified: Secondary | ICD-10-CM

## 2011-05-12 MED ORDER — IPRATROPIUM BROMIDE 0.02 % IN SOLN
0.5000 mg | Freq: Once | RESPIRATORY_TRACT | Status: AC
  Administered 2011-05-12: 0.5 mg via RESPIRATORY_TRACT

## 2011-05-12 MED ORDER — ALBUTEROL SULFATE 2 MG/5ML PO SYRP
2.0000 mg | ORAL_SOLUTION | Freq: Once | ORAL | Status: AC
  Administered 2011-05-12: 2 mg via ORAL

## 2011-05-12 MED ORDER — DOXYCYCLINE HYCLATE 100 MG PO TABS: 100.0000 mg | ORAL_TABLET | Freq: Two times a day (BID) | ORAL | Status: AC

## 2011-05-12 MED ORDER — PREDNISONE 50 MG PO TABS: 50.0000 mg | ORAL_TABLET | Freq: Every day | ORAL | Status: DC

## 2011-05-12 NOTE — Telephone Encounter (Signed)
Called Patient to Let patient know her chest x-ray did not show changes. She will follow up next week. She is to continue current plan.

## 2011-05-12 NOTE — Progress Notes (Signed)
  Subjective:    Patient ID: Theresa [name], female    DOB: 1965-05-28, 46 y.o.   MRN: [id number]  HPI  Patient with significant past medical history of long cancer status post resection, radiation and chemotherapy. She comes in with 3 weeks of cough as well as fevers at night. She reports a temperature of 100.1 last night. She reports a productive cough. She has albuterol at home which does not help very much. She states that she has rib pain from coughing. She continues to smoke but has not smoked in a week due to her shortness of breath. She does not have a history of COPD. She was seen 2 weeks ago in clinic and given albuterol and Tessalon Perles. She does not think the Tessalon Perles help her cough although she states she continues to take them.  Current smoker Review of Systems Denies nausea vomiting diarrhea    Objective:   Physical Exam  Vital signs reviewed General appearance - alert, well appearing, and in no distress and oriented to person, place, and time Heart - normal rate, regular rhythm, normal S1, S2, no murmurs, rubs, clicks or gallops Lungs-crackling left lower lung fields, wheezing throughout.       Assessment & Plan:

## 2011-05-12 NOTE — Patient Instructions (Signed)
I have written you a note to be out of work for the week Please go and see your cancer doctor.  Please go to have your x-ray done today I will call you tomorrow with the results I have sent in an antibiotic and steroid for you to take. Please continue your albuterol every 4-6 hours while you're awake  Come back in one week to see [place] to  recheck

## 2011-05-12 NOTE — Assessment & Plan Note (Signed)
Will treat as COPD exacerbation today since wheezing, O2 sat 92, fevers, productive cough. Prednisone, doxycycline. Chest x-ray today given history of lung cancer. Her last evaluation by oncology with CT showed her tumor shrunk but there is some possibility this is a post obstructive pneumonia. I really think this is unlikely to be a postobstructive pneumonia so will not wait to treat her. She plans to see her oncologist next week.

## 2011-06-02 ENCOUNTER — Other Ambulatory Visit: Payer: Self-pay | Admitting: Family Medicine

## 2011-06-02 MED ORDER — PROPRANOLOL HCL 80 MG PO TABS: 80.0000 mg | ORAL_TABLET | Freq: Every day | ORAL | Status: DC

## 2011-06-20 ENCOUNTER — Other Ambulatory Visit: Payer: Self-pay | Admitting: Family Medicine

## 2011-06-23 ENCOUNTER — Other Ambulatory Visit: Payer: Self-pay | Admitting: Family Medicine

## 2011-06-23 MED ORDER — FLUTICASONE PROPIONATE 50 MCG/ACT NA SUSP: NASAL | Status: DC

## 2011-07-14 ENCOUNTER — Ambulatory Visit (HOSPITAL_BASED_OUTPATIENT_CLINIC_OR_DEPARTMENT_OTHER): Payer: Medicare Other

## 2011-07-14 ENCOUNTER — Ambulatory Visit (HOSPITAL_COMMUNITY)
Admission: RE | Admit: 2011-07-14 | Discharge: 2011-07-14 | Disposition: A | Discharge status: Home / Self Care | Payer: Medicare Other | Source: Ambulatory Visit | Attending: Internal Medicine | Admitting: Internal Medicine

## 2011-07-14 ENCOUNTER — Other Ambulatory Visit (HOSPITAL_BASED_OUTPATIENT_CLINIC_OR_DEPARTMENT_OTHER): Payer: Medicare Other | Admitting: Lab

## 2011-07-14 VITALS — BP 139/86 | HR 57 | Temp 98.5°F

## 2011-07-14 DIAGNOSIS — C3490 Malignant neoplasm of unspecified part of unspecified bronchus or lung: Secondary | ICD-10-CM | POA: Insufficient documentation

## 2011-07-14 DIAGNOSIS — C3410 Malignant neoplasm of upper lobe, unspecified bronchus or lung: Secondary | ICD-10-CM

## 2011-07-14 DIAGNOSIS — Z452 Encounter for adjustment and management of vascular access device: Secondary | ICD-10-CM

## 2011-07-14 DIAGNOSIS — R05 Cough: Secondary | ICD-10-CM | POA: Insufficient documentation

## 2011-07-14 DIAGNOSIS — R059 Cough, unspecified: Secondary | ICD-10-CM | POA: Insufficient documentation

## 2011-07-14 DIAGNOSIS — R0602 Shortness of breath: Secondary | ICD-10-CM | POA: Insufficient documentation

## 2011-07-14 LAB — CMP (CANCER CENTER ONLY)
ALT(SGPT): 20 U/L (ref 10–47)
AST: 19 U/L (ref 11–38)
Albumin: 3.9 g/dL (ref 3.3–5.5)
Alkaline Phosphatase: 38 U/L (ref 26–84)
BUN, Bld: 13 mg/dL (ref 7–22)
CO2: 30 mEq/L (ref 18–33)
Calcium: 8.7 mg/dL (ref 8.0–10.3)
Chloride: 100 mEq/L (ref 98–108)
Creat: 1.3 mg/dl — ABNORMAL HIGH (ref 0.6–1.2)
Glucose, Bld: 90 mg/dL (ref 73–118)
Potassium: 3.4 mEq/L (ref 3.3–4.7)
Sodium: 141 mEq/L (ref 128–145)
Total Bilirubin: 1.2 mg/dl (ref 0.20–1.60)
Total Protein: 7.4 g/dL (ref 6.4–8.1)

## 2011-07-14 MED ORDER — SODIUM CHLORIDE 0.9 % IJ SOLN
10.0000 mL | INTRAMUSCULAR | Status: DC | PRN
  Filled 2011-07-14: qty 10

## 2011-07-14 MED ORDER — HEPARIN SOD (PORK) LOCK FLUSH 100 UNIT/ML IV SOLN
250.0000 [IU] | INTRAVENOUS | Status: DC | PRN
  Filled 2011-07-14: qty 5

## 2011-07-14 MED ORDER — SODIUM CHLORIDE 0.9 % IJ SOLN
10.0000 mL | INTRAMUSCULAR | Status: AC | PRN
  Administered 2011-07-14: 10 mL
  Filled 2011-07-14: qty 10

## 2011-07-14 MED ORDER — IOHEXOL 300 MG/ML  SOLN
80.0000 mL | Freq: Once | INTRAMUSCULAR | Status: AC | PRN
  Administered 2011-07-14: 80 mL via INTRAVENOUS

## 2011-07-14 MED ORDER — HEPARIN SOD (PORK) LOCK FLUSH 100 UNIT/ML IV SOLN
500.0000 [IU] | INTRAVENOUS | Status: AC | PRN
Start: 2011-07-14 — End: 2011-07-14
  Administered 2011-07-14: 500 [IU]
  Filled 2011-07-14: qty 5

## 2011-07-18 ENCOUNTER — Ambulatory Visit (HOSPITAL_BASED_OUTPATIENT_CLINIC_OR_DEPARTMENT_OTHER): Payer: Medicare Other | Admitting: Internal Medicine

## 2011-07-18 ENCOUNTER — Telehealth: Payer: Self-pay | Admitting: Internal Medicine

## 2011-07-18 VITALS — BP 160/90 | HR 74 | Temp 98.0°F | Ht 62.0 in | Wt 220.2 lb

## 2011-07-18 DIAGNOSIS — C3410 Malignant neoplasm of upper lobe, unspecified bronchus or lung: Secondary | ICD-10-CM

## 2011-07-18 DIAGNOSIS — C3490 Malignant neoplasm of unspecified part of unspecified bronchus or lung: Secondary | ICD-10-CM

## 2011-07-18 NOTE — Progress Notes (Signed)
[facility] [phone]   [phone]  OFFICE PROGRESS NOTE  [name], MD, MD [address] [place] [address]  DIAGNOSIS: Recurrent non-small cell lung cancer initially diagnosed as stage IA (T1b, N0, MX) adenocarcinoma in November 2009.   PRIOR THERAPY:  #1 Status post left upper lobe trisegmentectomy with lymph node dissection under the care of Dr. Edwyna Shell on March 24, 2009.  #2 Concurrent chemoradiation with carboplatin for AUC of 2 and paclitaxel 45 mg/M2 given weekly with radiation.   CURRENT THERAPY: Observation.  INTERVAL HISTORY: BRYANNA YIM 46 y.o. female returns to the clinic today for three-month followup visit accompanied by her husband. Patient is feeling fine today with no specific complaints except for shortness breath with exertion. She denied having any significant weight loss or night sweats. The patient has repeat CT scan of the chest performed recently and she is here today for evaluation and discussion of her scan results.  MEDICAL HISTORY: Past Medical History  Diagnosis Date  . Hypertension   . Hyperlipidemia   . Blindness, legal     glaucoma and retinitis  pigmentosa  . Glaucoma   . Onychomycosis   . Retinitis pigmentosa   . Chronic eczema   . Chronic headaches   . Diabetes mellitus type II     diet controlled  . Fibromyalgia   . Anxiety   . Lung cancer     lung ca dx 2010  . Hx of radiation therapy 01-17-11 to 03-18-11    lung  . History of chemotherapy 01/2011 to 03/2011    concurrent w/radiation therapy    ALLERGIES:  is allergic to morphine and related; codeine; and penicillins.  MEDICATIONS:  Current Outpatient Prescriptions  Medication Sig Dispense Refill  . albuterol (VENTOLIN HFA) 108 (90 BASE) MCG/ACT inhaler Inhale 2 puffs into the lungs every 4 (four) hours as needed.  2 Inhaler  5  . amLODipine (NORVASC) 10 MG tablet TAKE ONE (1) TABLET(S) DAILY  90 tablet  3  .  benzonatate (TESSALON PERLES) 100 MG capsule Take 1 capsule (100 mg total) by mouth 3 (three) times daily as needed for cough.  30 capsule  0  . betamethasone dipropionate (DIPROLENE) 0.05 % ointment Apply topically 3 (three) times daily. Dispense 15 gram tube       . carvedilol (COREG) 25 MG tablet Take 1 tablet (25 mg total) by mouth 2 (two) times daily.  60 tablet  11  . carvedilol (COREG) 25 MG tablet TAKE ONE (1) TABLET(S) TWICE DAILY  60 tablet  5  . fluticasone (FLONASE) 50 MCG/ACT nasal spray One spray into each nostril daily  16 g  5  . ondansetron (ZOFRAN) 8 MG tablet Take 8 mg by mouth every 12 (twelve) hours as needed. One po q12h prn nausea qty 30 no refills       . predniSONE (DELTASONE) 50 MG tablet Take 1 tablet (50 mg total) by mouth daily.  5 tablet  0  . prochlorperazine (COMPAZINE) 10 MG tablet Take 10 mg by mouth every 6 (six) hours as needed. One po every six hours prn nausea; qty 60; no refills      . propranolol (INDERAL) 80 MG tablet Take 1 tablet (80 mg total) by mouth daily.  30 tablet  11  . sucralfate (CARAFATE) 1 GM/10ML suspension Take 1 g by mouth every 6 (six) hours. 03/31/11 Take (1 gram) by mouth QID with meals and at bedtime- oral.  Take 5 mins before meals. No refills       . valsartan-hydrochlorothiazide (DIOVAN-HCT) 160-12.5 MG per tablet TAKE ONE TABLET BY MOUTH ONE TIME DAILY  30 tablet  5    SURGICAL HISTORY:  Past Surgical History  Procedure Date  . Lung surgery 03/24/08    L vats, L thoracotomy and LUL trisegmentectomy with node dissection  . Chest tube insertion  04/01/08    L hydropneumothorax  . Back surgery   . Eye surgery     lens implant  . Portacath placement 02/11/2011    Procedure: INSERTION PORT-A-CATH;  Surgeon: Norton Blizzard, MD;  Location: [facility] OR;  Service: Thoracic;  Laterality: Right;  9.6Fr. Pre-attached Power Port in Right Internal Jugular    REVIEW OF SYSTEMS:  A comprehensive review of systems was negative except for:  Respiratory: positive for cough and dyspnea on exertion   PHYSICAL EXAMINATION: General appearance: alert, cooperative and no distress Head: Normocephalic, without obvious abnormality, atraumatic Neck: no adenopathy Lymph nodes: Cervical, supraclavicular, and axillary nodes normal. Resp: clear to auscultation bilaterally Cardio: regular rate and rhythm, S1, S2 normal, no murmur, click, rub or gallop GI: soft, non-tender; bowel sounds normal; no masses,  no organomegaly Extremities: extremities normal, atraumatic, no cyanosis or edema Neurologic: Alert and oriented X 3, normal strength and tone. Normal symmetric reflexes. Normal coordination and gait  ECOG PERFORMANCE STATUS: 1 - Symptomatic but completely ambulatory  Blood pressure 160/90, pulse 74, temperature 98 F (36.7 C), temperature source Oral, height 5\' 2"  (1.575 m), weight 220 lb 3.2 oz (99.882 kg).  LABORATORY DATA: Lab Results  Component Value Date   WBC 7.6 04/25/2011   HGB 16.2* 04/25/2011   HCT 49.1* 04/25/2011   MCV 93.3 04/25/2011   PLT 235 04/25/2011      Chemistry      Component Value Date/Time   NA 141 07/14/2011 0934   NA 139 03/01/2011 1034   K 3.4 07/14/2011 0934   K 3.4* 03/01/2011 1034   CL 100 07/14/2011 0934   CL 100 03/01/2011 1034   CO2 30 07/14/2011 0934   CO2 33* 03/01/2011 1034   BUN 13 07/14/2011 0934   BUN 19 03/01/2011 1034   CREATININE 1.3* 07/14/2011 0934   CREATININE 1.36* 03/01/2011 1034      Component Value Date/Time   CALCIUM 8.7 07/14/2011 0934   CALCIUM 8.7 03/01/2011 1034   ALKPHOS 38 07/14/2011 0934   ALKPHOS 37* 03/01/2011 1034   AST 19 07/14/2011 0934   AST 13 03/01/2011 1034   ALT 12 03/01/2011 1034   BILITOT 1.20 07/14/2011 0934   BILITOT 0.7 03/01/2011 1034       RADIOGRAPHIC STUDIES: Ct Chest W Contrast  07/14/2011  *RADIOLOGY REPORT*  Clinical Data: Follow-up lung cancer.  Radiation therapy completed December 2012.  Shortness of breath and cough.  CT CHEST WITH CONTRAST  Technique:   Multidetector CT imaging of the chest was performed following the standard protocol during bolus administration of intravenous contrast.  Contrast: 80mL OMNIPAQUE IOHEXOL 300 MG/ML  SOLN  Comparison: 04/06/2011.  Findings: Sub centimeter low attenuation lesions are seen in the thyroid, as before.  No pathologically enlarged mediastinal, hilar or axillary lymph nodes.  Heart size normal.  No pericardial effusion.  Postoperative and postradiation changes are again seen in the upper left hemithorax.  There may be slightly more soft tissue density along the suture line (image 15), with new ground-glass opacity in the superior segment left lower lobe.  A 2  mm nodule along the right major fissure is stable and likely a subpleural lymph node. No pleural fluid.  Airway is otherwise unremarkable.  Incidental imaging of the upper abdomen shows scattered low attenuation lesions in the kidneys, measuring up to 11 mm on the left.  No worrisome lytic or sclerotic lesions.  IMPRESSION:  Postoperative changes and volume loss in the upper left hemithorax. Associated added density along the left major fissure and within the superior segment left lower lobe may represent evolutionary changes of radiation therapy.  Close attention on follow-up exams is warranted.  Original Report Authenticated By: Reyes Ivan, M.D.    ASSESSMENT: This is a very pleasant 46 years old [place] American female with recurrent non-small cell lung cancer recently treated with a course of concurrent chemoradiation. The patient is doing fine and she has no evidence for disease progression on the recent scan.  PLAN: I discussed the scan results with the patient and her husband. I recommended for her continuous observation for now with repeat CT scan of the chest with contrast in 3 months. She was advised to call me immediately if she has any concerning symptoms in the interval.  All questions were answered. The patient knows to call the clinic with  any problems, questions or concerns. We can certainly see the patient much sooner if necessary.

## 2011-07-18 NOTE — Telephone Encounter (Signed)
called pt lmovm to rtn call to schedule port flush appts

## 2011-07-18 NOTE — Telephone Encounter (Signed)
Gv pt appt for aug2013.  Ct scan on 08/05 @ [facility]

## 2011-07-26 ENCOUNTER — Telehealth: Payer: Self-pay | Admitting: Family Medicine

## 2011-07-26 NOTE — Telephone Encounter (Signed)
Called patient regarding paper refill request on gabapentin.  I reviewed her chart, and it looks like gabapentin was discontinued more than a year ago due to side effects, but I cannot tell what side effects.  I wanted to talk with her before refilling it.  I have asked her to call the office about what side effects gabapentin.

## 2011-07-29 ENCOUNTER — Encounter: Payer: Self-pay | Admitting: Family Medicine

## 2011-07-29 ENCOUNTER — Ambulatory Visit (INDEPENDENT_AMBULATORY_CARE_PROVIDER_SITE_OTHER): Payer: Medicare Other | Admitting: Family Medicine

## 2011-07-29 ENCOUNTER — Telehealth: Payer: Self-pay | Admitting: Family Medicine

## 2011-07-29 VITALS — BP 148/95 | HR 71 | Ht 62.0 in | Wt 217.0 lb

## 2011-07-29 DIAGNOSIS — L309 Dermatitis, unspecified: Secondary | ICD-10-CM

## 2011-07-29 DIAGNOSIS — E119 Type 2 diabetes mellitus without complications: Secondary | ICD-10-CM

## 2011-07-29 DIAGNOSIS — M549 Dorsalgia, unspecified: Secondary | ICD-10-CM

## 2011-07-29 DIAGNOSIS — L259 Unspecified contact dermatitis, unspecified cause: Secondary | ICD-10-CM

## 2011-07-29 LAB — POCT GLYCOSYLATED HEMOGLOBIN (HGB A1C): Hemoglobin A1C: 5.9

## 2011-07-29 LAB — COMPREHENSIVE METABOLIC PANEL
ALT: 13 U/L (ref 0–35)
AST: 16 U/L (ref 0–37)
Albumin: 4.5 g/dL (ref 3.5–5.2)
Alkaline Phosphatase: 41 U/L (ref 39–117)
BUN: 12 mg/dL (ref 6–23)
CO2: 26 mEq/L (ref 19–32)
Calcium: 9.2 mg/dL (ref 8.4–10.5)
Chloride: 102 mEq/L (ref 96–112)
Creat: 1.21 mg/dL — ABNORMAL HIGH (ref 0.50–1.10)
Glucose, Bld: 88 mg/dL (ref 70–99)
Potassium: 3.5 mEq/L (ref 3.5–5.3)
Sodium: 140 mEq/L (ref 135–145)
Total Bilirubin: 1.1 mg/dL (ref 0.3–1.2)
Total Protein: 7.2 g/dL (ref 6.0–8.3)

## 2011-07-29 LAB — LDL CHOLESTEROL, DIRECT: Direct LDL: 157 mg/dL — ABNORMAL HIGH

## 2011-07-29 MED ORDER — GABAPENTIN 300 MG PO CAPS: 300.0000 mg | ORAL_CAPSULE | Freq: Three times a day (TID) | ORAL | Status: DC

## 2011-07-29 MED ORDER — TRIAMCINOLONE ACETONIDE 0.05 % EX OINT: 1.0000 "application " | TOPICAL_OINTMENT | Freq: Two times a day (BID) | CUTANEOUS | Status: DC

## 2011-07-29 NOTE — Progress Notes (Signed)
Addended by: Jone Baseman D on: 07/29/2011 01:49 PM   Modules accepted: Orders

## 2011-07-29 NOTE — Progress Notes (Signed)
  Subjective:    Patient ID: Theresa [name], female    DOB: [birth year], 46 y.o.   MRN: [id number]  HPI Patient presents today for followup of back pain. She says that she has had back and hip pain on the left side for many years. She did not have any initial injury. She says that she feels like the pain starts in her back and radiates all the way down to her calf. She also has intermittent foot swelling on that side which has come and gone for the last several years. Initial history from patient was that this pain has only been going on for about a year. I have reviewed the chart and discovered: Several years ago she saw a neurosurgeon for injections. She stopped going there because of insurance reasons. She's been on and off gabapentin in the past. She has been referred to physical therapy but did not attend sessions. She had an MRI that was normal in December 2011.  Patient says that she felt better on gabapentin. She would like to restart this today.  Eczema-patient states that she continues to have itchy skin. She was using beclomethasone with some relief. She does not have a consistent skin regimen. She would like a dermatology referral.  Diabetes type II-patient states that though she's been told she has diabetes, she does not believe it. She is not checking sugars at home. She is not taking any medications at this time.  Been treated for lung cancer. Review of Systems No chest pain, shortness of breath, headache.    Objective:   Physical Exam Vital signs reviewed General appearance - alert, well appearing, and in no distress Heart - normal rate, regular rhythm, normal S1, S2, no murmurs, rubs, clicks or gallops Chest - clear to auscultation, no wheezes, rales or rhonchi, symmetric air entry, no tachypnea, retractions or cyanosis MSK-straight-leg test positive on left. Minimal trace pedal edema on left. No calf swelling or Homans sign. 4+ strength through left leg. Able to climb on table  without difficulty.  Skin-dry. Patient is scratching her skin turn exam.       Assessment & Plan:

## 2011-07-29 NOTE — Progress Notes (Signed)
Addended by: Ellery Plunk L on: 07/29/2011 01:18 PM   Modules accepted: Orders

## 2011-07-29 NOTE — Assessment & Plan Note (Signed)
Check A1c as well as cholesterol today. Followup with PCP

## 2011-07-29 NOTE — Telephone Encounter (Signed)
Pt went to pharm and they said that the cream she gave her today was discontinued.  She is really itching bad and wants to know what to do at this time.  Also waiting to hear what the phone number is to chiropractor.

## 2011-07-29 NOTE — Progress Notes (Signed)
Addended by: Jone Baseman D on: 07/29/2011 12:05 PM   Modules accepted: Orders

## 2011-07-29 NOTE — Telephone Encounter (Signed)
Ok to change triamcinolone to 0.25% per Dr. Hulen Luster.  Pharmacy informed.  Med comes in 15 grams instead of 17 grams.  Patient also informed of phone number and appt info for [facility].  Gaylene Brooks, RN

## 2011-07-29 NOTE — Telephone Encounter (Signed)
Spoke with [facility] pharmacist.  Triamcinolone 0.05% has been discontinued.  Ointment available in 0.1%, 0.25%, and 0.5%.  Will check with Dr. Hulen Luster to see which strength she wants.  Also, will route note to blue team for chiropractor phone number.  Gaylene Brooks, RN

## 2011-07-29 NOTE — Patient Instructions (Signed)
Today I sent in a referral to orthopedics as well as dermatology. I refilled her gabapentin. I would like you to start this at night and increase by 1 pill each week. So this week, take 1 pill at night. Next week take 1 pill at night one pill in the morning. The third week you can add the pill in the afternoon if you're not too sleepy.  Please get some Zyrtec over-the-counter for itching  Today we check some blood work as well.

## 2011-07-29 NOTE — Assessment & Plan Note (Signed)
Reviewed extensive records in epic. Patient with negative lumbar MRI December 2011. Patient with previous history of being seen for epidural injections. Patient's complaints have stayed the same throughout this time according to the records. She also seems to have a similar exam. She would like referral to orthopedics for further evaluation and possible injections. I will restart gabapentin which she said that she benefited from and I will have her see her regular doctor in 2 weeks

## 2011-07-29 NOTE — Assessment & Plan Note (Signed)
Patient desires dermatology referral for worsening eczema. Will fill triamcinolone today.

## 2011-08-02 ENCOUNTER — Encounter: Payer: Self-pay | Admitting: Family Medicine

## 2011-09-13 ENCOUNTER — Telehealth: Payer: Self-pay | Admitting: Family Medicine

## 2011-09-13 NOTE — Telephone Encounter (Signed)
Clearance for surgery form to be completed by [facility]. Fax to [phone].

## 2011-09-13 NOTE — Telephone Encounter (Signed)
Form placed in MD box.

## 2011-09-14 ENCOUNTER — Encounter: Payer: Self-pay | Admitting: Family Medicine

## 2011-09-16 NOTE — Telephone Encounter (Signed)
Form completed and faxed to [facility] as requested by patient.

## 2011-09-24 ENCOUNTER — Other Ambulatory Visit: Payer: Self-pay | Admitting: Specialist

## 2011-09-27 ENCOUNTER — Other Ambulatory Visit: Payer: Self-pay | Admitting: Specialist

## 2011-10-11 ENCOUNTER — Other Ambulatory Visit: Payer: Self-pay | Admitting: Specialist

## 2011-10-12 ENCOUNTER — Encounter (HOSPITAL_COMMUNITY): Payer: Self-pay | Admitting: Pharmacy Technician

## 2011-10-17 ENCOUNTER — Ambulatory Visit (HOSPITAL_COMMUNITY): Admission: RE | Admit: 2011-10-17 | Payer: Medicare Other | Source: Ambulatory Visit

## 2011-10-17 ENCOUNTER — Other Ambulatory Visit: Payer: Medicare Other | Admitting: Lab

## 2011-10-17 ENCOUNTER — Encounter (HOSPITAL_COMMUNITY): Payer: Self-pay

## 2011-10-17 ENCOUNTER — Ambulatory Visit (HOSPITAL_COMMUNITY)
Admission: RE | Admit: 2011-10-17 | Discharge: 2011-10-17 | Disposition: A | Discharge status: Home / Self Care | Payer: Medicare Other | Source: Ambulatory Visit | Attending: Specialist | Admitting: Specialist

## 2011-10-17 ENCOUNTER — Encounter (HOSPITAL_COMMUNITY)
Admission: RE | Admit: 2011-10-17 | Discharge: 2011-10-17 | Disposition: A | Discharge status: Home / Self Care | Payer: Medicare Other | Source: Ambulatory Visit | Attending: Specialist | Admitting: Specialist

## 2011-10-17 ENCOUNTER — Other Ambulatory Visit: Payer: Self-pay

## 2011-10-17 ENCOUNTER — Telehealth: Payer: Self-pay | Admitting: Medical Oncology

## 2011-10-17 DIAGNOSIS — Z01810 Encounter for preprocedural cardiovascular examination: Secondary | ICD-10-CM | POA: Insufficient documentation

## 2011-10-17 DIAGNOSIS — Z01812 Encounter for preprocedural laboratory examination: Secondary | ICD-10-CM | POA: Insufficient documentation

## 2011-10-17 DIAGNOSIS — Z01818 Encounter for other preprocedural examination: Secondary | ICD-10-CM | POA: Insufficient documentation

## 2011-10-17 DIAGNOSIS — R9431 Abnormal electrocardiogram [ECG] [EKG]: Secondary | ICD-10-CM | POA: Insufficient documentation

## 2011-10-17 DIAGNOSIS — Z902 Acquired absence of lung [part of]: Secondary | ICD-10-CM | POA: Insufficient documentation

## 2011-10-17 DIAGNOSIS — Z923 Personal history of irradiation: Secondary | ICD-10-CM | POA: Insufficient documentation

## 2011-10-17 DIAGNOSIS — G8929 Other chronic pain: Secondary | ICD-10-CM

## 2011-10-17 DIAGNOSIS — Z85118 Personal history of other malignant neoplasm of bronchus and lung: Secondary | ICD-10-CM | POA: Insufficient documentation

## 2011-10-17 HISTORY — DX: Other chronic pain: G89.29

## 2011-10-17 HISTORY — PX: KNEE ARTHROSCOPY: SUR90

## 2011-10-17 LAB — PROTIME-INR
INR: 0.94 (ref 0.00–1.49)
Prothrombin Time: 12.8 seconds (ref 11.6–15.2)

## 2011-10-17 LAB — CBC
HCT: 49.7 % — ABNORMAL HIGH (ref 36.0–46.0)
Hemoglobin: 16.8 g/dL — ABNORMAL HIGH (ref 12.0–15.0)
MCH: 30.4 pg (ref 26.0–34.0)
MCHC: 33.8 g/dL (ref 30.0–36.0)
MCV: 90 fL (ref 78.0–100.0)
Platelets: 170 10*3/uL (ref 150–400)
RBC: 5.52 MIL/uL — ABNORMAL HIGH (ref 3.87–5.11)
RDW: 13.1 % (ref 11.5–15.5)
WBC: 8.2 10*3/uL (ref 4.0–10.5)

## 2011-10-17 LAB — URINALYSIS, ROUTINE W REFLEX MICROSCOPIC
Bilirubin Urine: NEGATIVE
Glucose, UA: NEGATIVE mg/dL
Hgb urine dipstick: NEGATIVE
Ketones, ur: NEGATIVE mg/dL
Leukocytes, UA: NEGATIVE
Nitrite: NEGATIVE
Protein, ur: NEGATIVE mg/dL
Specific Gravity, Urine: 1.017 (ref 1.005–1.030)
Urobilinogen, UA: 0.2 mg/dL (ref 0.0–1.0)
pH: 7 (ref 5.0–8.0)

## 2011-10-17 LAB — BASIC METABOLIC PANEL
BUN: 12 mg/dL (ref 6–23)
CO2: 27 mEq/L (ref 19–32)
Calcium: 8.9 mg/dL (ref 8.4–10.5)
Chloride: 104 mEq/L (ref 96–112)
Creatinine, Ser: 1.07 mg/dL (ref 0.50–1.10)
GFR calc Af Amer: 70 mL/min — ABNORMAL LOW (ref >90)
GFR calc non Af Amer: 60 mL/min — ABNORMAL LOW (ref >90)
Glucose, Bld: 110 mg/dL — ABNORMAL HIGH (ref 70–99)
Potassium: 3.6 mEq/L (ref 3.5–5.1)
Sodium: 140 mEq/L (ref 135–145)

## 2011-10-17 LAB — SURGICAL PCR SCREEN
MRSA, PCR: NEGATIVE
Staphylococcus aureus: NEGATIVE

## 2011-10-17 NOTE — Telephone Encounter (Signed)
Verifying labs for CT

## 2011-10-17 NOTE — Patient Instructions (Addendum)
[name]  10/17/2011   Your procedure is scheduled on: 8-15  -2013  Report to [facility] at       0530  AM   Call this number if you have problems the morning of surgery: [phone]/    or [phone] -Presurgical Testing prior   Remember:   Do not eat food:After Midnight.    Take these medicines the morning of surgery with A SIP OF WATER: Coreg, Gabapentin, Norvasc. Use Chantix. Bring Cpap mask and Albuterol Inhaler.   Do not wear jewelry, make-up or nail polish.  Do not wear lotions, powders, or perfumes. You may wear deodorant.  Do not shave 48 hours prior to surgery.(face and neck okay, no shaving of legs)  Do not bring valuables to the hospital.  Contacts, dentures or bridgework may not be worn into surgery.  Leave suitcase in the car. After surgery it may be brought to your room.  For patients admitted to the hospital, checkout time is 11:00 AM the day of discharge.   Patients discharged the day of surgery will not be allowed to drive home.  Name and phone number of your driver: son  Special Instructions: CHG Shower Use Special Wash: 1/2 bottle night before surgery and 1/2 bottle morning of surgery.(avoid face and genitals)   Please read over the following fact sheets that you were given: MRSA Information, Blood Transfusion fact sheet, Incentive Spirometry Instruction.

## 2011-10-17 NOTE — Pre-Procedure Instructions (Addendum)
10-17-11 EKG/ CXR, Lumbar Spine done today. 10-18-11 CT Chest done per oncology MD.

## 2011-10-18 ENCOUNTER — Ambulatory Visit (HOSPITAL_COMMUNITY)
Admission: RE | Admit: 2011-10-18 | Discharge: 2011-10-18 | Disposition: A | Discharge status: Home / Self Care | Payer: Medicare Other | Source: Ambulatory Visit | Attending: Internal Medicine | Admitting: Internal Medicine

## 2011-10-18 DIAGNOSIS — C3490 Malignant neoplasm of unspecified part of unspecified bronchus or lung: Secondary | ICD-10-CM

## 2011-10-18 DIAGNOSIS — J984 Other disorders of lung: Secondary | ICD-10-CM | POA: Insufficient documentation

## 2011-10-18 DIAGNOSIS — R059 Cough, unspecified: Secondary | ICD-10-CM | POA: Insufficient documentation

## 2011-10-18 DIAGNOSIS — Z923 Personal history of irradiation: Secondary | ICD-10-CM | POA: Insufficient documentation

## 2011-10-18 DIAGNOSIS — Z85118 Personal history of other malignant neoplasm of bronchus and lung: Secondary | ICD-10-CM | POA: Insufficient documentation

## 2011-10-18 DIAGNOSIS — R0602 Shortness of breath: Secondary | ICD-10-CM | POA: Insufficient documentation

## 2011-10-18 DIAGNOSIS — R05 Cough: Secondary | ICD-10-CM | POA: Insufficient documentation

## 2011-10-18 DIAGNOSIS — R079 Chest pain, unspecified: Secondary | ICD-10-CM | POA: Insufficient documentation

## 2011-10-18 MED ORDER — IOHEXOL 300 MG/ML  SOLN
80.0000 mL | Freq: Once | INTRAMUSCULAR | Status: AC | PRN
  Administered 2011-10-18: 80 mL via INTRAVENOUS

## 2011-10-19 ENCOUNTER — Telehealth: Payer: Self-pay | Admitting: *Deleted

## 2011-10-19 ENCOUNTER — Ambulatory Visit (HOSPITAL_BASED_OUTPATIENT_CLINIC_OR_DEPARTMENT_OTHER): Payer: Medicare Other | Admitting: Internal Medicine

## 2011-10-19 VITALS — BP 138/86 | HR 82 | Temp 99.9°F | Resp 22 | Ht 62.0 in | Wt 222.4 lb

## 2011-10-19 DIAGNOSIS — C3410 Malignant neoplasm of upper lobe, unspecified bronchus or lung: Secondary | ICD-10-CM

## 2011-10-19 DIAGNOSIS — R509 Fever, unspecified: Secondary | ICD-10-CM

## 2011-10-19 DIAGNOSIS — J40 Bronchitis, not specified as acute or chronic: Secondary | ICD-10-CM

## 2011-10-19 DIAGNOSIS — C3490 Malignant neoplasm of unspecified part of unspecified bronchus or lung: Secondary | ICD-10-CM

## 2011-10-19 MED ORDER — AZITHROMYCIN 250 MG PO TABS: ORAL_TABLET | ORAL | Status: AC

## 2011-10-19 MED ORDER — PROCHLORPERAZINE MALEATE 10 MG PO TABS: 10.0000 mg | ORAL_TABLET | Freq: Four times a day (QID) | ORAL | Status: AC | PRN

## 2011-10-19 NOTE — Progress Notes (Signed)
[facility] [phone]) [phone]   [phone]  OFFICE PROGRESS NOTE  [name], MD [address] [place] [address]  DIAGNOSIS: Recurrent non-small cell lung cancer initially diagnosed as stage IA (T1b, N0, MX) adenocarcinoma in November 2009.   PRIOR THERAPY:  #1 Status post left upper lobe trisegmentectomy with lymph node dissection under the care of Dr. Edwyna Shell on March 24, 2009.  #2 Concurrent chemoradiation with carboplatin for AUC of 2 and paclitaxel 45 mg/M2 given weekly with radiation.    CURRENT THERAPY: Observation.  INTERVAL HISTORY: Theresa [name] 46 y.o. female returns to the clinic today for followup visit accompanied by her daughter and granddaughter. The patient is feeling fine today except for bronchitis with cough productive for yellowish sputum and low-grade fever. She denied having any significant chest pain or shortness breath. She has no weight loss or night sweats. The patient has repeat CT scan of the chest performed recently and she is here today for evaluation and discussion of her scan results.  MEDICAL HISTORY: Past Medical History  Diagnosis Date  . Hypertension   . Hyperlipidemia   . Blindness, legal     glaucoma and retinitis  pigmentosa  . Glaucoma   . Onychomycosis   . Retinitis pigmentosa   . Chronic eczema   . Diabetes mellitus type II     diet controlled  . Fibromyalgia   . Anxiety   . Lung cancer     lung ca dx 2010  . Hx of radiation therapy 01-17-11 to 03-18-11    lung  . History of chemotherapy 01/2011 to 03/2011    concurrent w/radiation therapy  . Chronic headaches 10-17-11    migraines    ALLERGIES:  is allergic to morphine and related; codeine; and penicillins.  MEDICATIONS:  Current Outpatient Prescriptions  Medication Sig Dispense Refill  . albuterol (PROVENTIL HFA;VENTOLIN HFA) 108 (90 BASE) MCG/ACT inhaler Inhale 2 puffs into the lungs every 4 (four) hours as needed. For  wheezing      . amLODipine (NORVASC) 10 MG tablet Take 10 mg by mouth every morning.      . carvedilol (COREG) 25 MG tablet Take 25 mg by mouth 2 (two) times daily with a meal.      . gabapentin (NEURONTIN) 300 MG capsule Take 1 capsule (300 mg total) by mouth 3 (three) times daily.  90 capsule  3  . TRIAMCINOLONE ACETONIDE, TOP, 0.05 % OINT Apply 1 application topically 2 (two) times daily.  17 g  0  . valsartan-hydrochlorothiazide (DIOVAN-HCT) 160-12.5 MG per tablet Take 1 tablet by mouth every morning.      . varenicline (CHANTIX) 0.5 MG tablet Take 0.5 mg by mouth 2 (two) times daily.      Marland Kitchen DISCONTD: carvedilol (COREG) 25 MG tablet Take 1 tablet (25 mg total) by mouth 2 (two) times daily.  60 tablet  11    SURGICAL HISTORY:  Past Surgical History  Procedure Date  . Lung surgery 03/24/08    L vats, L thoracotomy and LUL trisegmentectomy with node dissection  . Chest tube insertion  04/01/08    L hydropneumothorax  . Back surgery   . Eye surgery     lens implant  . Portacath placement 02/11/2011    Procedure: INSERTION PORT-A-CATH;  Surgeon: Norton Blizzard, MD;  Location: [facility] OR;  Service: Thoracic;  Laterality: Right;  9.6Fr. Pre-attached Power Port in Right Internal Jugular  -right chest-remains inplace 10-17-11  .  Abdominal hysterectomy 10-17-11    hx. of  . Knee arthroscopy 10-17-11    bil. knee scope(left was torn ligament)    REVIEW OF SYSTEMS:  A comprehensive review of systems was negative except for: Constitutional: positive for fevers Respiratory: positive for cough and sputum   PHYSICAL EXAMINATION: General appearance: alert, cooperative and no distress Head: Normocephalic, without obvious abnormality, atraumatic Neck: no adenopathy Lymph nodes: Cervical, supraclavicular, and axillary nodes normal. Resp: clear to auscultation bilaterally Cardio: regular rate and rhythm, S1, S2 normal, no murmur, click, rub or gallop GI: soft, non-tender; bowel sounds normal; no masses,  no  organomegaly Extremities: extremities normal, atraumatic, no cyanosis or edema  ECOG PERFORMANCE STATUS: 1 - Symptomatic but completely ambulatory  Blood pressure 138/86, pulse 82, temperature 99.9 F (37.7 C), temperature source Oral, resp. rate 22, height 5\' 2"  (1.575 m), weight 222 lb 6.4 oz (100.88 kg).  LABORATORY DATA: Lab Results  Component Value Date   WBC 8.2 10/17/2011   HGB 16.8* 10/17/2011   HCT 49.7* 10/17/2011   MCV 90.0 10/17/2011   PLT 170 10/17/2011      Chemistry      Component Value Date/Time   NA 140 10/17/2011 0950   NA 141 07/14/2011 0934   K 3.6 10/17/2011 0950   K 3.4 07/14/2011 0934   CL 104 10/17/2011 0950   CL 100 07/14/2011 0934   CO2 27 10/17/2011 0950   CO2 30 07/14/2011 0934   BUN 12 10/17/2011 0950   BUN 13 07/14/2011 0934   CREATININE 1.07 10/17/2011 0950   CREATININE 1.21* 07/29/2011 1021      Component Value Date/Time   CALCIUM 8.9 10/17/2011 0950   CALCIUM 8.7 07/14/2011 0934   ALKPHOS 41 07/29/2011 1021   ALKPHOS 38 07/14/2011 0934   AST 16 07/29/2011 1021   AST 19 07/14/2011 0934   ALT 13 07/29/2011 1021   BILITOT 1.1 07/29/2011 1021   BILITOT 1.20 07/14/2011 0934       RADIOGRAPHIC STUDIES: Dg Chest 2 View  10/17/2011  *RADIOLOGY REPORT*  Clinical Data: Preoperative respiratory examination for lumbar spine surgery.  History of lung cancer.  CHEST - 2 VIEW  Comparison: Chest radiographs 05/12/2011.  Chest CT 07/14/2011.  Findings: Right IJ Port-A-Cath tip is unchanged in the lower SVC. Heart size and mediastinal contours are stable with superior hilar retraction on the left.  Postsurgical changes in the left hemithorax are stable.  There is slightly increased perihilar parenchymal scarring and apical pleural thickening.  The right lung is clear.  There is no evidence of recurrent mass or significant pleural effusion.  IMPRESSION: Evolving postsurgical changes in the upper left hemithorax status post partial lung resection and radiation therapy.  No evidence of local recurrence  or acute process.  Original Report Authenticated By: Gerrianne Scale, M.D.   Dg Lumbar Spine 2-3 Views  10/17/2011  *RADIOLOGY REPORT*  Clinical Data: Preoperative evaluation for lumbar spine surgery. History of lung cancer.  LUMBAR SPINE - 2-3 VIEW  Comparison: PET CT 11/24/2010.  Lumbar spine MRI 03/11/2010.  Findings: There are five lumbar type vertebral bodies.  The alignment is normal.  There is no evidence of acute fracture, pars defect or metastatic disease.  Facet disease is present inferiorly.  IMPRESSION: Lower lumbar spine facet hypertrophy.  No evidence of acute fracture, malalignment or metastatic disease.  Original Report Authenticated By: Gerrianne Scale, M.D.   Ct Chest W Contrast  10/18/2011  *RADIOLOGY REPORT*  Clinical Data: Chest pain,  cough and shortness of breath.  History of lung cancer status post surgery and completion of radiation therapy.  CT CHEST WITH CONTRAST  Technique:  Multidetector CT imaging of the chest was performed following the standard protocol during bolus administration of intravenous contrast.  Contrast: 80mL OMNIPAQUE IOHEXOL 300 MG/ML  SOLN  Comparison: Chest CT 07/14/2011.  Findings: Right IJ Port-A-Cath appears unchanged within the SVC. There are no pathologically enlarged mediastinal or hilar lymph nodes.  Small mediastinal lymph nodes are unchanged.  There is no significant pleural or pericardial effusion.  Minimal thyroid nodularity appears unchanged.  There are stable postsurgical changes status post left upper lobe wedge resection.  The soft tissue density surrounding the suture line is unchanged.  There is increased mixed ground-glass and airspace opacity more posteriorly within the super segment of the lower lobe measuring up to 3.1 cm transverse. There is no well- defined mass or adjacent rib destruction.  This would appear to be within the radiation port and is most consistent with radiation fibrosis.  Focal pleural thickening anteriorly in the lower  left chest is stable.  No chest wall abnormality seen in this region.  The right lung is clear.  The visualized upper abdomen has a stable appearance with small low- density lesions in the upper pole of the left kidney.  There is no adrenal mass.  There are no suspicious osseous findings.  IMPRESSION:  1.  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  If warranted clinically for more specific evaluation, PET CT could be performed to exclude significantly increased metabolic activity in this area.  The density immediately surrounding the suture line is unchanged. 2.  No definite local recurrence or metastatic disease. 3.  Stable appearance of the visualized upper abdomen.  Original Report Authenticated By: Gerrianne Scale, M.D.    ASSESSMENT: This is a very pleasant 46 years old African American female with recurrent non-small cell lung cancer, adenocarcinoma status post left upper lobe trisegmentectomy followed by concurrent chemoradiation for the recurrent disease and the patient has been observation since December of 2012. The patient has no evidence for disease progression on his recent scan.  PLAN: I discussed the scan results with the patient and her family and recommended for her continuous observation for now with repeat CT scan of the chest in 3 months. For the acute bronchitis, I started the patient on Z-Pak and she was advised to followup with her primary care physician if there is no improvement in her condition.  All questions were answered. The patient knows to call the clinic with any problems, questions or concerns. We can certainly see the patient much sooner if necessary.

## 2011-10-19 NOTE — Telephone Encounter (Signed)
Gave patient appointment for 01-18-2012 lab and scan and 01-2012 for md appointment

## 2011-10-27 ENCOUNTER — Ambulatory Visit (HOSPITAL_COMMUNITY): Payer: Medicare Other | Admitting: Registered Nurse

## 2011-10-27 ENCOUNTER — Encounter (HOSPITAL_COMMUNITY)
Admission: RE | Disposition: A | Discharge status: Home / Self Care | Payer: Self-pay | Source: Ambulatory Visit | Attending: Specialist

## 2011-10-27 ENCOUNTER — Encounter (HOSPITAL_COMMUNITY): Payer: Self-pay | Admitting: Orthopedic Surgery

## 2011-10-27 ENCOUNTER — Encounter (HOSPITAL_COMMUNITY): Payer: Self-pay | Admitting: Registered Nurse

## 2011-10-27 ENCOUNTER — Ambulatory Visit (HOSPITAL_COMMUNITY): Payer: Medicare Other

## 2011-10-27 ENCOUNTER — Observation Stay (HOSPITAL_COMMUNITY)
Admission: RE | Admit: 2011-10-27 | Discharge: 2011-10-28 | Disposition: A | Discharge status: Home / Self Care | Payer: Medicare Other | Source: Ambulatory Visit | Attending: Specialist | Admitting: Specialist

## 2011-10-27 ENCOUNTER — Encounter (HOSPITAL_COMMUNITY): Payer: Self-pay | Admitting: *Deleted

## 2011-10-27 ENCOUNTER — Other Ambulatory Visit: Payer: Self-pay

## 2011-10-27 DIAGNOSIS — G43909 Migraine, unspecified, not intractable, without status migrainosus: Secondary | ICD-10-CM | POA: Insufficient documentation

## 2011-10-27 DIAGNOSIS — Z85118 Personal history of other malignant neoplasm of bronchus and lung: Secondary | ICD-10-CM | POA: Insufficient documentation

## 2011-10-27 DIAGNOSIS — D1779 Benign lipomatous neoplasm of other sites: Secondary | ICD-10-CM | POA: Insufficient documentation

## 2011-10-27 DIAGNOSIS — E119 Type 2 diabetes mellitus without complications: Secondary | ICD-10-CM | POA: Insufficient documentation

## 2011-10-27 DIAGNOSIS — M7130 Other bursal cyst, unspecified site: Secondary | ICD-10-CM | POA: Insufficient documentation

## 2011-10-27 DIAGNOSIS — M549 Dorsalgia, unspecified: Secondary | ICD-10-CM

## 2011-10-27 DIAGNOSIS — E785 Hyperlipidemia, unspecified: Secondary | ICD-10-CM | POA: Insufficient documentation

## 2011-10-27 DIAGNOSIS — IMO0001 Reserved for inherently not codable concepts without codable children: Secondary | ICD-10-CM | POA: Insufficient documentation

## 2011-10-27 DIAGNOSIS — Z79899 Other long term (current) drug therapy: Secondary | ICD-10-CM | POA: Insufficient documentation

## 2011-10-27 DIAGNOSIS — L309 Dermatitis, unspecified: Secondary | ICD-10-CM

## 2011-10-27 DIAGNOSIS — M48061 Spinal stenosis, lumbar region without neurogenic claudication: Principal | ICD-10-CM | POA: Insufficient documentation

## 2011-10-27 DIAGNOSIS — M5126 Other intervertebral disc displacement, lumbar region: Secondary | ICD-10-CM

## 2011-10-27 DIAGNOSIS — I10 Essential (primary) hypertension: Secondary | ICD-10-CM | POA: Insufficient documentation

## 2011-10-27 HISTORY — PX: LUMBAR LAMINECTOMY/DECOMPRESSION MICRODISCECTOMY: SHX5026

## 2011-10-27 LAB — GLUCOSE, CAPILLARY
Glucose-Capillary: 142 mg/dL — ABNORMAL HIGH (ref 70–99)
Glucose-Capillary: 161 mg/dL — ABNORMAL HIGH (ref 70–99)
Glucose-Capillary: 143 mg/dL — ABNORMAL HIGH (ref 70–99)
Glucose-Capillary: 139 mg/dL — ABNORMAL HIGH (ref 70–99)

## 2011-10-27 LAB — ABO/RH: ABO/RH(D): O POS

## 2011-10-27 LAB — TYPE AND SCREEN
ABO/RH(D): O POS
Antibody Screen: NEGATIVE

## 2011-10-27 SURGERY — LUMBAR LAMINECTOMY/DECOMPRESSION MICRODISCECTOMY
Anesthesia: General | Site: Back | Wound class: Clean

## 2011-10-27 MED ORDER — FENTANYL CITRATE 0.05 MG/ML IJ SOLN
INTRAMUSCULAR | Status: AC
  Filled 2011-10-27: qty 2

## 2011-10-27 MED ORDER — SODIUM CHLORIDE 0.9 % IR SOLN
Status: DC | PRN
  Administered 2011-10-27: 08:00:00

## 2011-10-27 MED ORDER — VANCOMYCIN HCL 1000 MG IV SOLR
1250.0000 mg | Freq: Once | INTRAVENOUS | Status: AC
  Administered 2011-10-27: 1250 mg via INTRAVENOUS
  Filled 2011-10-27: qty 1250

## 2011-10-27 MED ORDER — CHLORHEXIDINE GLUCONATE 4 % EX LIQD
60.0000 mL | Freq: Once | CUTANEOUS | Status: DC
  Filled 2011-10-27: qty 60

## 2011-10-27 MED ORDER — GLYCOPYRROLATE 0.2 MG/ML IJ SOLN
INTRAMUSCULAR | Status: DC | PRN
  Administered 2011-10-27: .8 mg via INTRAVENOUS

## 2011-10-27 MED ORDER — THROMBIN 5000 UNITS EX SOLR
OROMUCOSAL | Status: DC | PRN
  Administered 2011-10-27: 08:00:00 via TOPICAL

## 2011-10-27 MED ORDER — GABAPENTIN 300 MG PO CAPS
300.0000 mg | ORAL_CAPSULE | Freq: Three times a day (TID) | ORAL | Status: DC
  Administered 2011-10-27 – 2011-10-28 (×3): 300 mg via ORAL
  Filled 2011-10-27 (×5): qty 1

## 2011-10-27 MED ORDER — ROCURONIUM BROMIDE 100 MG/10ML IV SOLN
INTRAVENOUS | Status: DC | PRN
  Administered 2011-10-27: 50 mg via INTRAVENOUS

## 2011-10-27 MED ORDER — THROMBIN 5000 UNITS EX SOLR
CUTANEOUS | Status: AC
  Filled 2011-10-27: qty 10000

## 2011-10-27 MED ORDER — ACETAMINOPHEN 10 MG/ML IV SOLN: 1000.0000 mg | Freq: Once | INTRAVENOUS | Status: DC | PRN

## 2011-10-27 MED ORDER — BUPIVACAINE-EPINEPHRINE 0.5% -1:200000 IJ SOLN
INTRAMUSCULAR | Status: DC | PRN
  Administered 2011-10-27: 15 mL

## 2011-10-27 MED ORDER — SODIUM CHLORIDE 0.9 % IJ SOLN
3.0000 mL | Freq: Two times a day (BID) | INTRAMUSCULAR | Status: DC
  Administered 2011-10-28: 3 mL via INTRAVENOUS

## 2011-10-27 MED ORDER — LACTATED RINGERS IV SOLN
INTRAVENOUS | Status: DC
  Administered 2011-10-27: 07:00:00 via INTRAVENOUS

## 2011-10-27 MED ORDER — MEPERIDINE HCL 50 MG/ML IJ SOLN: 6.2500 mg | INTRAMUSCULAR | Status: DC | PRN

## 2011-10-27 MED ORDER — METHOCARBAMOL 100 MG/ML IJ SOLN
500.0000 mg | Freq: Three times a day (TID) | INTRAVENOUS | Status: DC | PRN
  Administered 2011-10-27 – 2011-10-28 (×2): 500 mg via INTRAVENOUS
  Filled 2011-10-27 (×2): qty 5

## 2011-10-27 MED ORDER — INSULIN ASPART 100 UNIT/ML ~~LOC~~ SOLN
0.0000 [IU] | Freq: Three times a day (TID) | SUBCUTANEOUS | Status: DC
Start: 2011-10-27 — End: 2011-10-28
  Administered 2011-10-27 – 2011-10-28 (×2): 2 [IU] via SUBCUTANEOUS

## 2011-10-27 MED ORDER — CIPROFLOXACIN IN D5W 400 MG/200ML IV SOLN
INTRAVENOUS | Status: AC
  Filled 2011-10-27: qty 200

## 2011-10-27 MED ORDER — FENTANYL CITRATE 0.05 MG/ML IJ SOLN
INTRAMUSCULAR | Status: DC | PRN
  Administered 2011-10-27 (×2): 50 ug via INTRAVENOUS

## 2011-10-27 MED ORDER — AMLODIPINE BESYLATE 10 MG PO TABS
10.0000 mg | ORAL_TABLET | Freq: Every morning | ORAL | Status: DC
  Administered 2011-10-28: 10 mg via ORAL
  Filled 2011-10-27: qty 1

## 2011-10-27 MED ORDER — BUPIVACAINE-EPINEPHRINE (PF) 0.5% -1:200000 IJ SOLN
INTRAMUSCULAR | Status: AC
  Filled 2011-10-27: qty 10

## 2011-10-27 MED ORDER — CIPROFLOXACIN IN D5W 400 MG/200ML IV SOLN
INTRAVENOUS | Status: DC | PRN
  Administered 2011-10-27: 400 mg via INTRAVENOUS

## 2011-10-27 MED ORDER — LIDOCAINE HCL (CARDIAC) 20 MG/ML IV SOLN
INTRAVENOUS | Status: DC | PRN
  Administered 2011-10-27: 80 mg via INTRAVENOUS

## 2011-10-27 MED ORDER — TRIAMCINOLONE ACETONIDE 0.1 % EX CREA
1.0000 "application " | TOPICAL_CREAM | Freq: Two times a day (BID) | CUTANEOUS | Status: DC
  Administered 2011-10-27 – 2011-10-28 (×2): 1 via TOPICAL
  Filled 2011-10-27: qty 15

## 2011-10-27 MED ORDER — ACETAMINOPHEN 10 MG/ML IV SOLN
INTRAVENOUS | Status: AC
  Filled 2011-10-27: qty 100

## 2011-10-27 MED ORDER — PHENYLEPHRINE HCL 10 MG/ML IJ SOLN
INTRAMUSCULAR | Status: DC | PRN
  Administered 2011-10-27 (×2): 160 ug via INTRAVENOUS
  Administered 2011-10-27 (×3): 80 ug via INTRAVENOUS

## 2011-10-27 MED ORDER — CARVEDILOL 25 MG PO TABS
25.0000 mg | ORAL_TABLET | Freq: Two times a day (BID) | ORAL | Status: DC
  Administered 2011-10-27 – 2011-10-28 (×2): 25 mg via ORAL
  Filled 2011-10-27 (×4): qty 1

## 2011-10-27 MED ORDER — ACETAMINOPHEN 10 MG/ML IV SOLN
INTRAVENOUS | Status: DC | PRN
  Administered 2011-10-27: 1000 mg via INTRAVENOUS

## 2011-10-27 MED ORDER — VANCOMYCIN HCL 500 MG IV SOLR
INTRAVENOUS | Status: AC
  Filled 2011-10-27: qty 500

## 2011-10-27 MED ORDER — OXYCODONE-ACETAMINOPHEN 5-325 MG PO TABS
1.0000 | ORAL_TABLET | ORAL | Status: DC | PRN
  Administered 2011-10-27 – 2011-10-28 (×4): 2 via ORAL
  Filled 2011-10-27 (×4): qty 2

## 2011-10-27 MED ORDER — MIDAZOLAM HCL 5 MG/5ML IJ SOLN
INTRAMUSCULAR | Status: DC | PRN
  Administered 2011-10-27: 2 mg via INTRAVENOUS

## 2011-10-27 MED ORDER — SODIUM CHLORIDE 0.45 % IV SOLN
INTRAVENOUS | Status: DC
  Administered 2011-10-27: 16:00:00 via INTRAVENOUS

## 2011-10-27 MED ORDER — VALSARTAN-HYDROCHLOROTHIAZIDE 160-12.5 MG PO TABS: 1.0000 | ORAL_TABLET | Freq: Every morning | ORAL | Status: DC

## 2011-10-27 MED ORDER — ONDANSETRON HCL 4 MG/2ML IJ SOLN
INTRAMUSCULAR | Status: DC | PRN
  Administered 2011-10-27: 4 mg via INTRAVENOUS

## 2011-10-27 MED ORDER — EPHEDRINE SULFATE 50 MG/ML IJ SOLN
INTRAMUSCULAR | Status: DC | PRN
  Administered 2011-10-27 (×3): 5 mg via INTRAVENOUS
  Administered 2011-10-27: 10 mg via INTRAVENOUS
  Administered 2011-10-27: 5 mg via INTRAVENOUS

## 2011-10-27 MED ORDER — FENTANYL CITRATE 0.05 MG/ML IJ SOLN
25.0000 ug | INTRAMUSCULAR | Status: DC | PRN
  Administered 2011-10-27 (×3): 50 ug via INTRAVENOUS

## 2011-10-27 MED ORDER — ONDANSETRON HCL 4 MG/2ML IJ SOLN
4.0000 mg | INTRAMUSCULAR | Status: DC | PRN
  Administered 2011-10-28: 4 mg via INTRAVENOUS
  Filled 2011-10-27: qty 2

## 2011-10-27 MED ORDER — SODIUM CHLORIDE 0.9 % IV SOLN: 250.0000 mL | INTRAVENOUS | Status: DC

## 2011-10-27 MED ORDER — PHENOL 1.4 % MT LIQD: 1.0000 | OROMUCOSAL | Status: DC | PRN

## 2011-10-27 MED ORDER — HYDROCHLOROTHIAZIDE 12.5 MG PO CAPS
12.5000 mg | ORAL_CAPSULE | Freq: Every day | ORAL | Status: DC
  Administered 2011-10-28: 12.5 mg via ORAL
  Filled 2011-10-27: qty 1

## 2011-10-27 MED ORDER — NEOSTIGMINE METHYLSULFATE 1 MG/ML IJ SOLN
INTRAMUSCULAR | Status: DC | PRN
  Administered 2011-10-27: 5 mg via INTRAVENOUS

## 2011-10-27 MED ORDER — ACETAMINOPHEN 650 MG RE SUPP: 650.0000 mg | RECTAL | Status: DC | PRN

## 2011-10-27 MED ORDER — IRBESARTAN 150 MG PO TABS
150.0000 mg | ORAL_TABLET | Freq: Every day | ORAL | Status: DC
  Administered 2011-10-28: 150 mg via ORAL
  Filled 2011-10-27: qty 1

## 2011-10-27 MED ORDER — SODIUM CHLORIDE 0.9 % IJ SOLN: 3.0000 mL | INTRAMUSCULAR | Status: DC | PRN

## 2011-10-27 MED ORDER — HYDROMORPHONE HCL PF 1 MG/ML IJ SOLN
0.5000 mg | INTRAMUSCULAR | Status: DC | PRN
  Administered 2011-10-27 – 2011-10-28 (×6): 1 mg via INTRAVENOUS
  Filled 2011-10-27 (×6): qty 1

## 2011-10-27 MED ORDER — PROPOFOL 10 MG/ML IV EMUL
INTRAVENOUS | Status: DC | PRN
  Administered 2011-10-27: 250 mg via INTRAVENOUS

## 2011-10-27 MED ORDER — DOCUSATE SODIUM 100 MG PO CAPS
100.0000 mg | ORAL_CAPSULE | Freq: Two times a day (BID) | ORAL | Status: DC
  Administered 2011-10-27 – 2011-10-28 (×2): 100 mg via ORAL

## 2011-10-27 MED ORDER — ACETAMINOPHEN 325 MG PO TABS: 650.0000 mg | ORAL_TABLET | ORAL | Status: DC | PRN

## 2011-10-27 MED ORDER — SODIUM CHLORIDE 0.9 % IV SOLN
1500.0000 mg | INTRAVENOUS | Status: AC
  Administered 2011-10-27: 1500 mg via INTRAVENOUS
  Filled 2011-10-27: qty 1500

## 2011-10-27 MED ORDER — MENTHOL 3 MG MT LOZG: 1.0000 | LOZENGE | OROMUCOSAL | Status: DC | PRN

## 2011-10-27 MED ORDER — VARENICLINE TARTRATE 0.5 MG PO TABS
0.5000 mg | ORAL_TABLET | Freq: Two times a day (BID) | ORAL | Status: DC
  Administered 2011-10-27: 0.5 mg via ORAL
  Filled 2011-10-27 (×4): qty 1

## 2011-10-27 MED ORDER — PROMETHAZINE HCL 25 MG/ML IJ SOLN: 6.2500 mg | INTRAMUSCULAR | Status: DC | PRN

## 2011-10-27 MED ORDER — VANCOMYCIN HCL IN DEXTROSE 1-5 GM/200ML-% IV SOLN
INTRAVENOUS | Status: AC
  Filled 2011-10-27: qty 200

## 2011-10-27 MED ORDER — SODIUM CHLORIDE 0.9 % IV SOLN
INTRAVENOUS | Status: AC
  Filled 2011-10-27: qty 100

## 2011-10-27 MED ORDER — ALBUTEROL SULFATE HFA 108 (90 BASE) MCG/ACT IN AERS
2.0000 | INHALATION_SPRAY | RESPIRATORY_TRACT | Status: DC | PRN
  Filled 2011-10-27: qty 6.7

## 2011-10-27 SURGICAL SUPPLY — 48 items
APL SKNCLS STERI-STRIP NONHPOA (GAUZE/BANDAGES/DRESSINGS) ×1
BAG SPEC THK2 15X12 ZIP CLS (MISCELLANEOUS) ×1
BAG ZIPLOCK 12X15 (MISCELLANEOUS) ×2 IMPLANT
BENZOIN TINCTURE PRP APPL 2/3 (GAUZE/BANDAGES/DRESSINGS) ×3 IMPLANT
CHLORAPREP W/TINT 26ML (MISCELLANEOUS) IMPLANT
CLEANER TIP ELECTROSURG 2X2 (MISCELLANEOUS) ×2 IMPLANT
CLOTH BEACON ORANGE TIMEOUT ST (SAFETY) ×2 IMPLANT
CLSR STERI-STRIP ANTIMIC 1/2X4 (GAUZE/BANDAGES/DRESSINGS) ×1 IMPLANT
DECANTER SPIKE VIAL GLASS SM (MISCELLANEOUS) ×2 IMPLANT
DRAPE LG THREE QUARTER DISP (DRAPES) ×1 IMPLANT
DRAPE MICROSCOPE LEICA (MISCELLANEOUS) ×2 IMPLANT
DRAPE POUCH INSTRU U-SHP 10X18 (DRAPES) ×2 IMPLANT
DRAPE SURG 17X11 SM STRL (DRAPES) ×2 IMPLANT
DRSG EMULSION OIL 3X3 NADH (GAUZE/BANDAGES/DRESSINGS) IMPLANT
DRSG TELFA 4X5 ISLAND ADH (GAUZE/BANDAGES/DRESSINGS) ×1 IMPLANT
DURAPREP 26ML APPLICATOR (WOUND CARE) ×2 IMPLANT
ELECT BLADE TIP CTD 4 INCH (ELECTRODE) ×1 IMPLANT
ELECT REM PT RETURN 9FT ADLT (ELECTROSURGICAL) ×2
ELECTRODE REM PT RTRN 9FT ADLT (ELECTROSURGICAL) ×1 IMPLANT
GLOVE BIOGEL PI IND STRL 8 (GLOVE) ×1 IMPLANT
GLOVE BIOGEL PI INDICATOR 8 (GLOVE) ×1
GLOVE SURG SS PI 8.0 STRL IVOR (GLOVE) ×4 IMPLANT
GOWN PREVENTION PLUS LG XLONG (DISPOSABLE) ×2 IMPLANT
GOWN STRL REIN XL XLG (GOWN DISPOSABLE) ×3 IMPLANT
KIT BASIN OR (CUSTOM PROCEDURE TRAY) ×2 IMPLANT
KIT POSITIONING SURG ANDREWS (MISCELLANEOUS) ×2 IMPLANT
MANIFOLD NEPTUNE II (INSTRUMENTS) ×2 IMPLANT
NDL SPNL 18GX3.5 QUINCKE PK (NEEDLE) ×3 IMPLANT
NEEDLE SPNL 18GX3.5 QUINCKE PK (NEEDLE) ×4 IMPLANT
PATTIES SURGICAL .5 X.5 (GAUZE/BANDAGES/DRESSINGS) IMPLANT
PATTIES SURGICAL .75X.75 (GAUZE/BANDAGES/DRESSINGS) IMPLANT
PATTIES SURGICAL 1X1 (DISPOSABLE) IMPLANT
SPONGE LAP 18X18 X RAY DECT (DISPOSABLE) ×1 IMPLANT
SPONGE SURGIFOAM ABS GEL 100 (HEMOSTASIS) ×2 IMPLANT
SUT PROLENE 3 0 PS 2 (SUTURE) IMPLANT
SUT VIC AB 0 CT1 27 (SUTURE)
SUT VIC AB 0 CT1 27XBRD ANTBC (SUTURE) IMPLANT
SUT VIC AB 1 CT1 27 (SUTURE)
SUT VIC AB 1 CT1 27XBRD ANTBC (SUTURE) ×1 IMPLANT
SUT VIC AB 1-0 CT2 27 (SUTURE) ×1 IMPLANT
SUT VIC AB 2-0 CT1 27 (SUTURE)
SUT VIC AB 2-0 CT1 TAPERPNT 27 (SUTURE) ×1 IMPLANT
SUT VIC AB 2-0 CT2 27 (SUTURE) ×2 IMPLANT
SUT VICRYL 0 27 CT2 27 ABS (SUTURE) ×2 IMPLANT
SUT VICRYL 0 UR6 27IN ABS (SUTURE) IMPLANT
SYRINGE 10CC LL (SYRINGE) ×2 IMPLANT
TRAY LAMINECTOMY (CUSTOM PROCEDURE TRAY) ×2 IMPLANT
YANKAUER SUCT BULB TIP NO VENT (SUCTIONS) ×2 IMPLANT

## 2011-10-27 NOTE — Anesthesia Preprocedure Evaluation (Addendum)
Anesthesia Evaluation  Patient identified by MRN, date of birth, ID band Patient awake    Reviewed: Allergy & Precautions, H&P , NPO status , Patient's Chart, lab work & pertinent test results  History of Anesthesia Complications (+) AWARENESS UNDER ANESTHESIA  Airway Mallampati: IV TM Distance: >3 FB Neck ROM: Full    Dental  (+) Teeth Intact and Dental Advisory Given   Pulmonary  breath sounds clear to auscultation  Pulmonary exam normal       Cardiovascular hypertension, Pt. on medications and Pt. on home beta blockers Rhythm:Regular Rate:Normal     Neuro/Psych  Headaches, Anxiety Depression  Neuromuscular disease    GI/Hepatic GERD-  Poorly Controlled,  Endo/Other  Well Controlled, Type obesity  Renal/GU Renal InsufficiencyRenal disease     Musculoskeletal  (+) Fibromyalgia -  Abdominal (+) + obese,   Peds  Hematology   Anesthesia Other Findings   Reproductive/Obstetrics                       Anesthesia Physical Anesthesia Plan  ASA: III  Anesthesia Plan: General   Post-op Pain Management:    Induction: Intravenous, Rapid sequence and Cricoid pressure planned  Airway Management Planned: Oral ETT  Additional Equipment:   Intra-op Plan:   Post-operative Plan: Extubation in OR  Informed Consent: I have reviewed the patients History and Physical, chart, labs and discussed the procedure including the risks, benefits and alternatives for the proposed anesthesia with the patient or authorized representative who has indicated his/her understanding and acceptance.   Dental advisory given  Plan Discussed with: CRNA and Surgeon  Anesthesia Plan Comments:        Anesthesia Quick Evaluation

## 2011-10-27 NOTE — Progress Notes (Signed)
Rt set patient up on CPAP, 5cmH2O, with 2lpm O2.  Patient stable and ready for sleep.

## 2011-10-27 NOTE — Progress Notes (Signed)
Gave 1-800 number for One Touch meter given to pt for faulty meter at home.  Very appreciative.  Helyn App, RD, LDN, CDE

## 2011-10-27 NOTE — Progress Notes (Signed)
ANTIBIOTIC CONSULT NOTE - INITIAL  Pharmacy Consult for:  Vancomycin Indication:  Surgical prophylaxis  Allergies  Allergen Reactions  . Morphine And Related Nausea Only  . Codeine     REACTION: GI upset  . Penicillins     REACTION: hives    Patient Measurements (taken 10/19/11): Height: 5'2" Weight: 100.9 kg  Vital Signs: Temp: 98 F (36.7 C) (08/15 1200) Temp src: Oral (08/15 0523) BP: 103/68 mmHg (08/15 1215) Pulse Rate: 60  (08/15 1215)  Labs (10/17/11): BUN 12   SCr 1.07   Estimated CrCl 71 ml/min  WBCs 8.2  Microbiology: Recent Results (from the past 720 hour(s))  SURGICAL PCR SCREEN     Status: Normal   Collection Time   10/17/11  8:55 AM      Component Value Range Status Comment   MRSA, PCR NEGATIVE  NEGATIVE Final    Staphylococcus aureus NEGATIVE  NEGATIVE Final     Medical History: Past Medical History  Diagnosis Date  . Hypertension   . Hyperlipidemia   . Blindness, legal     glaucoma and retinitis  pigmentosa  . Glaucoma   . Onychomycosis   . Retinitis pigmentosa   . Chronic eczema   . Diabetes mellitus type II     diet controlled  . Fibromyalgia   . Anxiety   . Lung cancer     lung ca dx 2010  . Hx of radiation therapy 01-17-11 to 03-18-11    lung  . History of chemotherapy 01/2011 to 03/2011    concurrent w/radiation therapy  . Chronic headaches 10-17-11    migraines    Medications:  Prescriptions prior to admission  Medication Sig Dispense Refill  . amLODipine (NORVASC) 10 MG tablet Take 10 mg by mouth every morning.      . carvedilol (COREG) 25 MG tablet Take 25 mg by mouth 2 (two) times daily with a meal.      . gabapentin (NEURONTIN) 300 MG capsule Take 1 capsule (300 mg total) by mouth 3 (three) times daily.  90 capsule  3  . TRIAMCINOLONE ACETONIDE, TOP, 0.05 % OINT Apply 1 application topically 2 (two) times daily.  17 g  0  . valsartan-hydrochlorothiazide (DIOVAN-HCT) 160-12.5 MG per tablet Take 1 tablet by mouth every morning.      .  varenicline (CHANTIX) 0.5 MG tablet Take 0.5 mg by mouth 2 (two) times daily.      Marland Kitchen albuterol (PROVENTIL HFA;VENTOLIN HFA) 108 (90 BASE) MCG/ACT inhaler Inhale 2 puffs into the lungs every 4 (four) hours as needed. For wheezing      . prochlorperazine (COMPAZINE) 10 MG tablet Take 1 tablet (10 mg total) by mouth every 6 (six) hours as needed.  30 tablet  0   Assessment:  Asked to calculate Vancomycin dose for surgical prophylaxis following lumbar laminectomy/decompression microdiscectomy today.  Vancomycin 1500 mg IV was given at 07:41  Goal of Therapy:  Prevention of post-op infection  Plan:  Vancomycin 1250 mg x 1 dose at 20:00, approximately 12 hours after the pre-operative dose.  Polo Riley R.Ph. 10/27/2011 12:40 PM

## 2011-10-27 NOTE — Progress Notes (Signed)
CBG 161 AT 10:20 A.M.

## 2011-10-27 NOTE — H&P (Signed)
Theresa [name] is an 46 y.o. female.   Chief Complaint: Left leg pain HPI: HNP L45   Past Medical History  Diagnosis Date  . Hypertension   . Hyperlipidemia   . Blindness, legal     glaucoma and retinitis  pigmentosa  . Glaucoma   . Onychomycosis   . Retinitis pigmentosa   . Chronic eczema   . Diabetes mellitus type II     diet controlled  . Fibromyalgia   . Anxiety   . Lung cancer     lung ca dx 2010  . Hx of radiation therapy 01-17-11 to 03-18-11    lung  . History of chemotherapy 01/2011 to 03/2011    concurrent w/radiation therapy  . Chronic headaches 10-17-11    migraines    Past Surgical History  Procedure Date  . Lung surgery 03/24/08    L vats, L thoracotomy and LUL trisegmentectomy with node dissection  . Chest tube insertion  04/01/08    L hydropneumothorax  . Back surgery   . Eye surgery     lens implant  . Portacath placement 02/11/2011    Procedure: INSERTION PORT-A-CATH;  Surgeon: Norton Blizzard, MD;  Location: [facility] OR;  Service: Thoracic;  Laterality: Right;  9.6Fr. Pre-attached Power Port in Right Internal Jugular  -right chest-remains inplace 10-17-11  . Abdominal hysterectomy 10-17-11    hx. of  . Knee arthroscopy 10-17-11    bil. knee scope(left was torn ligament)    Family History  Problem Relation Age of Onset  . Cancer Maternal Aunt     brain   Social History:  reports that she has been smoking Cigarettes.  She has a 15 pack-year smoking history. She has never used smokeless tobacco. She reports that she drinks alcohol. She reports that she does not use illicit drugs.  Allergies:  Allergies  Allergen Reactions  . Morphine And Related Nausea Only  . Codeine     REACTION: GI upset  . Penicillins     REACTION: hives    Medications Prior to Admission  Medication Sig Dispense Refill  . amLODipine (NORVASC) 10 MG tablet Take 10 mg by mouth every morning.      . carvedilol (COREG) 25 MG tablet Take 25 mg by mouth 2 (two) times daily with a meal.        . gabapentin (NEURONTIN) 300 MG capsule Take 1 capsule (300 mg total) by mouth 3 (three) times daily.  90 capsule  3  . TRIAMCINOLONE ACETONIDE, TOP, 0.05 % OINT Apply 1 application topically 2 (two) times daily.  17 g  0  . valsartan-hydrochlorothiazide (DIOVAN-HCT) 160-12.5 MG per tablet Take 1 tablet by mouth every morning.      . varenicline (CHANTIX) 0.5 MG tablet Take 0.5 mg by mouth 2 (two) times daily.      Marland Kitchen albuterol (PROVENTIL HFA;VENTOLIN HFA) 108 (90 BASE) MCG/ACT inhaler Inhale 2 puffs into the lungs every 4 (four) hours as needed. For wheezing      . prochlorperazine (COMPAZINE) 10 MG tablet Take 1 tablet (10 mg total) by mouth every 6 (six) hours as needed.  30 tablet  0    Results for orders placed during the hospital encounter of 10/27/11 (from the past 48 hour(s))  GLUCOSE, CAPILLARY     Status: Abnormal   Collection Time   10/27/11  6:02 AM      Component Value Range Comment   Glucose-Capillary 142 (*) 70 - 99 mg/dL   TYPE AND  SCREEN     Status: Normal (Preliminary result)   Collection Time   10/27/11  6:20 AM      Component Value Range Comment   ABO/RH(D) O POS      Antibody Screen PENDING      Sample Expiration 10/30/2011      No results found.  Review of Systems  Constitutional: Negative.   HENT: Negative.   Eyes: Negative.   Respiratory: Negative.   Cardiovascular: Negative.   Gastrointestinal: Negative.   Genitourinary: Negative.   Musculoskeletal: Negative.   Skin: Negative.   Neurological: Positive for tingling.  Endo/Heme/Allergies: Negative.   Psychiatric/Behavioral: Negative.     Blood pressure 121/75, pulse 69, temperature 99.3 F (37.4 C), temperature source Oral, resp. rate 18, SpO2 97.00%. Physical Exam  Constitutional: She is oriented to person, place, and time. She appears well-developed.  HENT:  Head: Normocephalic.  Eyes: Pupils are equal, round, and reactive to light.  Neck: Normal range of motion.  Cardiovascular: Normal rate.    Respiratory: Effort normal.  GI: Soft.  Neurological: She is alert and oriented to person, place, and time.  Skin: Skin is warm and dry.  Psychiatric: She has a normal mood and affect.   SLR positive left 20 leg pain to medial ankle. Quad 5-/5. Reports slightly decreased sensation L4 left. No DVT.  Assessment/Plan Foramenal HNP L45 left refractory. Plan decompression. Risks disdussed.  Karys Meckley C 10/27/2011, 7:21 AM

## 2011-10-27 NOTE — Transfer of Care (Signed)
Immediate Anesthesia Transfer of Care Note  Patient: Theresa [name]  Procedure(s) Performed: Procedure(s) (LRB): LUMBAR LAMINECTOMY/DECOMPRESSION MICRODISCECTOMY (N/A)  Patient Location: PACU  Anesthesia Type: General  Level of Consciousness: awake, alert , oriented and patient cooperative  Airway & Oxygen Therapy: Patient Spontanous Breathing and Patient connected to face mask oxygen  Post-op Assessment: Report given to PACU RN, Post -op Vital signs reviewed and stable and Patient moving all extremities  Post vital signs: Reviewed and stable  Complications: No apparent anesthesia complications

## 2011-10-27 NOTE — Anesthesia Postprocedure Evaluation (Signed)
Anesthesia Post Note  Patient: Theresa [name]  Procedure(s) Performed: Procedure(s) (LRB): LUMBAR LAMINECTOMY/DECOMPRESSION MICRODISCECTOMY (N/A)  Anesthesia type: General  Patient location: PACU  Post pain: Pain level controlled  Post assessment: Post-op Vital signs reviewed  Last Vitals: BP 102/64  Pulse 64  Temp 36.6 C (Oral)  Resp 18  SpO2 100%  Post vital signs: Reviewed  Level of consciousness: sedated  Complications: No apparent anesthesia complications

## 2011-10-27 NOTE — Brief Op Note (Signed)
10/27/2011  9:35 AM  PATIENT:  Theresa [name]  45 y.o. 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  PROCEDURE:  Procedure(s) (LRB): LUMBAR LAMINECTOMY/DECOMPRESSION MICRODISCECTOMY (N/A)  SURGEON:  Surgeon(s) and Role:    * Javier Docker, MD - Primary  PHYSICIAN ASSISTANT:   ASSISTANTS: Chabon   ANESTHESIA:   general  EBL:  Total I/O In: 1000 [I.V.:1000] Out: 150 [Urine:100; Blood:50]  BLOOD ADMINISTERED:none  DRAINS: none   LOCAL MEDICATIONS USED:  MARCAINE     SPECIMEN:  Source of Specimen:  L45 disc  DISPOSITION OF SPECIMEN:  PATHOLOGY  COUNTS:  YES  TOURNIQUET:  * No tourniquets in log *  DICTATION: .Other Dictation: Dictation Number  [id number]  PLAN OF CARE: Admit for overnight observation  PATIENT DISPOSITION:  PACU - hemodynamically stable.   Delay start of Pharmacological VTE agent (>24hrs) due to surgical blood loss or risk of bleeding: yes

## 2011-10-27 NOTE — Preoperative (Signed)
Beta Blockers   Reason not to administer Beta Blockers:Coreg taken at 0415 10-27-11

## 2011-10-28 ENCOUNTER — Encounter (HOSPITAL_COMMUNITY): Payer: Self-pay | Admitting: Specialist

## 2011-10-28 LAB — BASIC METABOLIC PANEL
BUN: 12 mg/dL (ref 6–23)
CO2: 24 mEq/L (ref 19–32)
Calcium: 8.9 mg/dL (ref 8.4–10.5)
Chloride: 101 mEq/L (ref 96–112)
Creatinine, Ser: 1.54 mg/dL — ABNORMAL HIGH (ref 0.50–1.10)
GFR calc Af Amer: 45 mL/min — ABNORMAL LOW (ref >90)
GFR calc non Af Amer: 39 mL/min — ABNORMAL LOW (ref >90)
Glucose, Bld: 128 mg/dL — ABNORMAL HIGH (ref 70–99)
Potassium: 3.4 mEq/L — ABNORMAL LOW (ref 3.5–5.1)
Sodium: 136 mEq/L (ref 135–145)

## 2011-10-28 LAB — GLUCOSE, CAPILLARY: Glucose-Capillary: 126 mg/dL — ABNORMAL HIGH (ref 70–99)

## 2011-10-28 MED ORDER — OXYCODONE-ACETAMINOPHEN 7.5-325 MG PO TABS: 1.0000 | ORAL_TABLET | ORAL | Status: AC | PRN

## 2011-10-28 MED ORDER — METHOCARBAMOL 500 MG PO TABS: 500.0000 mg | ORAL_TABLET | Freq: Three times a day (TID) | ORAL | Status: AC

## 2011-10-28 NOTE — Progress Notes (Signed)
Physical Therapy Treatment Patient Details Name: Theresa [name] MRN: [id number] DOB: 12-Nov-1965 Today's Date: 10/28/2011 Time: 4098-1191 PT Time Calculation (min): 17 min  PT Assessment / Plan / Recommendation Comments on Treatment Session  2nd session-practiced 2 steps, reviewed car transfer technique. Encouraged to ambulate often, as tolerated. No further questions/concerns from pt/husband. Plan to d/c home later today. Recommend HHPT    Follow Up Recommendations  Home health PT    Barriers to Discharge        Equipment Recommendations  Rolling walker with 5" wheels    Recommendations for Other Services    Frequency Min 5X/week   Plan Discharge plan remains appropriate    Precautions / Restrictions Precautions Precautions: Back Precaution Comments: Verbally reviewed back precautions Restrictions Weight Bearing Restrictions: No   Pertinent Vitals/Pain Still having significant pain with mobility-8/10    Mobility  Bed Mobility Bed Mobility: Rolling Right;Right Sidelying to Sit;Sit to Sidelying Right Rolling Right: 5: Supervision Right Sidelying to Sit: 5: Supervision Sit to Sidelying Right: 4: Min assist Details for Bed Mobility Assistance: VCs safety. Assist for bil LEs onto bed.  Transfers Transfers: Sit to Stand;Stand to Sit Sit to Stand: 5: Supervision;With upper extremity assist;From bed Stand to Sit: 5: Supervision;With upper extremity assist;To bed Details for Transfer Assistance: vCs safety, hand placement, technique. Slow to rise, descend Ambulation/Gait Ambulation/Gait Assistance: 4: Min guard Ambulation Distance (Feet): 50 Feet Assistive device: Rolling walker Ambulation/Gait Assistance Details: VCs safe use of RW. Very slow gait speed. Pt reports 10/10 pain with ambulation. Limited tolerance  Gait Pattern: Step-through pattern;Decreased stride length;Decreased step length - right;Decreased step length - left Stairs: Yes Stairs Assistance: 4: Min  assist Stairs Assistance Details (indicate cue type and reason): vCs safety, technique, sequence. Husband present and asissted with 1 hand support on 1 side, pt held onto rail on L side (to simulate awning she will be able to hold onto). Assist to stabilize.  Stair Management Technique: Forwards Number of Stairs: 2     Exercises     PT Diagnosis: Difficulty walking;Acute pain;Abnormality of gait  PT Problem List: Decreased activity tolerance;Decreased mobility;Pain;Decreased knowledge of use of DME;Decreased knowledge of precautions PT Treatment Interventions: DME instruction;Gait training;Stair training;Functional mobility training;Therapeutic activities;Patient/family education   PT Goals Acute Rehab PT Goals PT Goal Formulation: With patient Time For Goal Achievement: 11/04/11 Potential to Achieve Goals: Good Pt will go Sit to Stand: with modified independence PT Goal: Sit to Stand - Progress: Progressing toward goal Pt will Ambulate: 51 - 150 feet;with modified independence PT Goal: Ambulate - Progress: Progressing toward goal Pt will Go Up / Down Stairs: 1-2 stairs;with least restrictive assistive device;with supervision PT Goal: Up/Down Stairs - Progress: Progressing toward goal  Visit Information  Last PT Received On: 10/28/11 Assistance Needed: +1    Subjective Data  Subjective: "Come here steps..." Patient Stated Goal: Less pain. Home   Cognition  Overall Cognitive Status: Appears within functional limits for tasks assessed/performed Arousal/Alertness: Awake/alert Orientation Level: Appears intact for tasks assessed Behavior During Session: [facility] for tasks performed    Balance     End of Session PT - End of Session Equipment Utilized During Treatment: Gait belt Activity Tolerance: Patient limited by pain Patient left: in bed;with call bell/phone within reach;with family/visitor present   GP Functional Assessment Tool Used: Clinical judgement Functional Limitation:  Mobility: Walking and moving around Mobility: Walking and Moving Around Current Status (Y7829): At least 1 percent but less than 20 percent impaired, limited or restricted Mobility:  Walking and Moving Around Goal Status [phone]): 0 percent impaired, limited or restricted   Rebeca Alert [facility] 10/28/2011, 11:31 AM [phone]

## 2011-10-28 NOTE — Evaluation (Signed)
Physical Therapy Evaluation Patient Details Name: Theresa [name] MRN: [id number] DOB: [birth year] Today's Date: 10/28/2011 Time: 0920-0950 PT Time Calculation (min): 30 min  PT Assessment / Plan / Recommendation Clinical Impression  46 yo female s/p L4-L5 laminectomy/decompression. On eval, pt's mobility significantly limited by pain. Very slow ambulation speed. Demonstrates limited activity tolerance. Need to see pt one more time today to practice stairs. Recommend HHPT and RW.     PT Assessment  Patient needs continued PT services    Follow Up Recommendations  Home health PT    Barriers to Discharge        Equipment Recommendations  Rolling walker with 5" wheels    Recommendations for Other Services     Frequency Min 5X/week    Precautions / Restrictions Precautions Precautions: Back Precaution Comments: Verbally reviewed back precautions Restrictions Weight Bearing Restrictions: No   Pertinent Vitals/Pain 10/10 with ambulation      Mobility  Bed Mobility Bed Mobility: Not assessed Details for Bed Mobility Assistance: Pt sitting up in recliner Transfers Transfers: Sit to Stand;Stand to Sit Sit to Stand: 5: Supervision;With upper extremity assist;With armrests;From chair/3-in-1 Stand to Sit: 5: Supervision;With upper extremity assist;With armrests;To chair/3-in-1 Details for Transfer Assistance: VCs safety, technique, hand placement. Very slow to rise, descend Ambulation/Gait Ambulation/Gait Assistance: 4: Min guard Ambulation Distance (Feet): 50 Feet Assistive device: Rolling walker Ambulation/Gait Assistance Details: VCs safe use of RW. Very slow gait speed. Pt reports 10/10 pain with ambulation. Limited tolerance  Gait Pattern: Step-through pattern;Decreased stride length    Exercises     PT Diagnosis: Difficulty walking;Acute pain;Abnormality of gait  PT Problem List: Decreased activity tolerance;Decreased mobility;Pain;Decreased knowledge of use of  DME;Decreased knowledge of precautions PT Treatment Interventions: DME instruction;Gait training;Stair training;Functional mobility training;Therapeutic activities;Patient/family education   PT Goals Acute Rehab PT Goals PT Goal Formulation: With patient Time For Goal Achievement: 11/04/11 Potential to Achieve Goals: Good Pt will go Sit to Stand: with modified independence PT Goal: Sit to Stand - Progress: Goal set today Pt will Ambulate: 51 - 150 feet;with modified independence;with least restrictive assistive device PT Goal: Ambulate - Progress: Goal set today Pt will Go Up / Down Stairs: 1-2 stairs;with supervision;with least restrictive assistive device PT Goal: Up/Down Stairs - Progress: Goal set today  Visit Information  Last PT Received On: 10/28/11 Assistance Needed: +1    Subjective Data  Subjective: "This hurts" Patient Stated Goal: Less pain.Home   Prior Functioning  Home Living Lives With: Spouse Type of Home: House Home Access: Stairs to enter Secretary/administrator of Steps: 2 Home Layout: One level Bathroom Shower/Tub: Health visitor: Handicapped height Home Adaptive Equipment: None Prior Function Level of Independence: Independent Communication Communication: No difficulties    Cognition  Overall Cognitive Status: Appears within functional limits for tasks assessed/performed Arousal/Alertness: Awake/alert Orientation Level: Appears intact for tasks assessed Behavior During Session: [facility] for tasks performed    Extremity/Trunk Assessment Right Upper Extremity Assessment RUE ROM/Strength/Tone: [facility] for tasks assessed Left Upper Extremity Assessment LUE ROM/Strength/Tone: [facility] for tasks assessed Right Lower Extremity Assessment RLE ROM/Strength/Tone: [facility] for tasks assessed Left Lower Extremity Assessment LLE ROM/Strength/Tone: WFL for tasks assessed Trunk Assessment Trunk Assessment: Normal   Balance    End of Session PT - End of  Session Equipment Utilized During Treatment: Gait belt Activity Tolerance: Patient limited by pain Patient left: in chair;with call bell/phone within reach;with family/visitor present  GP Functional Assessment Tool Used: Clinical judgement Functional Limitation: Mobility: Walking and moving around Mobility:  Walking and Moving Around Current Status [phone]): At least 1 percent but less than 20 percent impaired, limited or restricted Mobility: Walking and Moving Around Goal Status [phone]): 0 percent impaired, limited or restricted   Rebeca Alert [facility] 10/28/2011, 10:26 AM [phone]

## 2011-10-28 NOTE — Evaluation (Signed)
Occupational Therapy Evaluation Patient Details Name: Theresa [name] MRN: [id number] DOB: 11-11-1965 Today's Date: 10/28/2011 Time: 4098-1191 OT Time Calculation (min): 28 min  OT Assessment / Plan / Recommendation Clinical Impression  This 46 year old female was admitted for L4-5 mircrodisc.  All education was completed and husband came at end of session when I reviewed handout with recommendations.  Also educated both that she should not carry her heavy handbag.  Pt/husband verbalize all education.  She does not need further OT    OT Assessment  Patient does not need any further OT services    Follow Up Recommendations  No OT follow up    Barriers to Discharge      Equipment Recommendations  None recommended by OT    Recommendations for Other Services    Frequency       Precautions / Restrictions Precautions Precautions: Back Restrictions Weight Bearing Restrictions: No   Pertinent Vitals/Pain 8/10 with movement; RN brought pain meds    ADL  Grooming: Simulated;Supervision/safety Where Assessed - Grooming: Unsupported standing Upper Body Bathing: Simulated;Supervision/safety Where Assessed - Upper Body Bathing: Supported sitting Lower Body Bathing: Simulated;Moderate assistance Where Assessed - Lower Body Bathing: Supported sit to stand Upper Body Dressing: Simulated;Set up Where Assessed - Upper Body Dressing: Supported sitting Lower Body Dressing: Simulated;Maximal assistance Where Assessed - Lower Body Dressing: Supported sit to Pharmacist, hospital: Simulated;Supervision/safety (bed to chair to bed; used toilet earlier) Statistician Method: Sit to stand Toileting - Architect and Hygiene: Simulated;Minimal assistance Where Assessed - Engineer, mining and Hygiene: Standing Tub/Shower Transfer:  (reviewed sequence) Equipment Used: Rolling walker Transfers/Ambulation Related to ADLs: practiced bed mobility:  min A for sit to supine;  supervision for supine to sit ADL Comments: educated on ways to perform LB adls with back precautions and resource list given for toilet aid as pt would benefit.  Pt would also benefit from long sponge/net.  Handouts given    OT Diagnosis:    OT Problem List:   OT Treatment Interventions:     OT Goals    Visit Information  Last OT Received On: 10/28/11 Assistance Needed: +1    Subjective Data  Subjective: "Oh, I didn't know all of that (back precautions)" Patient Stated Goal: home, independent   Prior Functioning  Vision/Perception  Home Living Lives With: Spouse Type of Home: House Home Access: Stairs to enter Secretary/administrator of Steps: 2 Home Layout: One level Bathroom Shower/Tub: Health visitor: Handicapped height Home Adaptive Equipment: None Prior Function Level of Independence: Independent Communication Communication: No difficulties      Cognition  Overall Cognitive Status: Appears within functional limits for tasks assessed/performed Behavior During Session: [facility] for tasks performed    Extremity/Trunk Assessment Right Upper Extremity Assessment RUE ROM/Strength/Tone: [facility] for tasks assessed Left Upper Extremity Assessment LUE ROM/Strength/Tone: WFL for tasks assessed   Mobility     Exercise    Balance    End of Session OT - End of Session Activity Tolerance: Patient tolerated treatment well Patient left: in chair;with call bell/phone within reach;with family/visitor present  GO Functional Assessment Tool Used: clinical judgment Functional Limitation: Self care Self Care Current Status (Y7829): At least 60 percent but less than 80 percent impaired, limited or restricted Self Care Goal Status (F6213): At least 60 percent but less than 80 percent impaired, limited or restricted Self Care Discharge Status [phone]): At least 60 percent but less than 80 percent impaired, limited or restricted   [facility] 10/28/2011,  10:03  AM Marica Otter, OTR/L [phone] 10/28/2011

## 2011-10-28 NOTE — Progress Notes (Signed)
Pt for d/c to home today with probable HHC PT per PT recommendation. IV d/c'd. Dressing dry & intact with noted small shadowing on the dressing. D/C instructions discussed with pt & husband. RX given for home meds needed. No changes in am assessments today. Dressing supplies provided for home use. Son will pick up pt & husband to bring home d/t legal blindnesss of both.

## 2011-10-28 NOTE — Discharge Summary (Signed)
Physician Discharge Summary  Patient ID: Theresa [name] MRN: [id number] DOB/AGE: 05-14-1965 46 y.o.  Admit date: 10/27/2011 Discharge date: 10/28/2011  Admission Diagnoses: HNP Lumbar  Discharge Diagnoses: Same Stenosis Active Problems:  * No active hospital problems. *    Discharged Condition: good  Hospital Course: Tolerated well. No complications. Good result from surgery.  Consults: None  Significant Diagnostic Studies: labs:   Treatments: surgery: Lumbar decompression L45  Discharge Exam: Blood pressure 117/78, pulse 66, temperature 99.1 F (37.3 C), temperature source Oral, resp. rate 16, height 5\' 2"  (1.575 m), weight 100.699 kg (222 lb), SpO2 100.00%. Neurologic: Grossly normal.   Disposition: 01-Home or Self Care  Discharge Orders    Future Appointments: Provider: Department: Dept Phone: Center:   01/18/2012 8:00 AM Radene Gunning [facility] [phone] None   01/18/2012 8:30 AM [facility] [phone] [place]   01/23/2012 9:00 AM Si Gaul, MD [facility] [phone] None     Future Orders Please Complete By Expires   Diet - low sodium heart healthy      Call MD / Call 911      Comments:   If you experience chest pain or shortness of breath, CALL 911 and be transported to the hospital emergency room.  If you develope a fever above 101 F, pus (white drainage) or increased drainage or redness at the wound, or calf pain, call your surgeon's office.   Constipation Prevention      Comments:   Drink plenty of fluids.  Prune juice may be helpful.  You may use a stool softener, such as Colace (over the counter) 100 mg twice a day.  Use MiraLax (over the counter) for constipation as needed.   Increase activity slowly as tolerated      Discharge instructions      Comments:   Walk As Tolerated utilizing back precautions.  No bending, twisting, or lifting.  No driving for 2 weeks.  Ok to shower in 72 hours. Change dressing once a day and  keep dry. See Dr. Shelle Iron in office in 10 to 14 days.   Take 1 aspirin per day starting on Monday.     Medication List  As of 10/28/2011  7:11 AM   STOP taking these medications         prochlorperazine 10 MG tablet         TAKE these medications         albuterol 108 (90 BASE) MCG/ACT inhaler   Commonly known as: PROVENTIL HFA;VENTOLIN HFA   Inhale 2 puffs into the lungs every 4 (four) hours as needed. For wheezing      amLODipine 10 MG tablet   Commonly known as: NORVASC   Take 10 mg by mouth every morning.      carvedilol 25 MG tablet   Commonly known as: COREG   Take 25 mg by mouth 2 (two) times daily with a meal.      gabapentin 300 MG capsule   Commonly known as: NEURONTIN   Take 1 capsule (300 mg total) by mouth 3 (three) times daily.      methocarbamol 500 MG tablet   Commonly known as: ROBAXIN   Take 1 tablet (500 mg total) by mouth 3 (three) times daily.      oxyCODONE-acetaminophen 7.5-325 MG per tablet   Commonly known as: PERCOCET   Take 1 tablet by mouth every 4 (four) hours as needed for pain.      TRIAMCINOLONE ACETONIDE (  TOP) 0.05 % Oint   Apply 1 application topically 2 (two) times daily.      valsartan-hydrochlorothiazide 160-12.5 MG per tablet   Commonly known as: DIOVAN-HCT   Take 1 tablet by mouth every morning.      varenicline 0.5 MG tablet   Commonly known as: CHANTIX   Take 0.5 mg by mouth 2 (two) times daily.           Follow-up Information    Follow up with Annajulia Lewing C, MD in 10 days.   Contact information:   [facility] [address], [address] [place] [address] [phone]          Signed: Javier Docker 10/28/2011, 7:11 AM

## 2011-10-28 NOTE — Care Management Note (Signed)
    Page 1 of 2   10/28/2011     12:14:12 PM   CARE MANAGEMENT NOTE 10/28/2011  Patient:  Theresa [name], Theresa [name]   Account Number:  [id number]  Date Initiated:  10/28/2011  Documentation initiated by:  Colleen Can  Subjective/Objective Assessment:   dx herniated disc L4-5, spinal stenosis; microdecompression, microdiskectomy, removal epidural lipoma and synovial cyst     Action/Plan:   CM spoke with patient. Plans are for her to return to her home in [place] where spouse will be caregiver. Will need RW and 3n1. Wants HH agency in network . wants [facility]   Anticipated DC Date:  10/28/2011   Anticipated DC Plan:  HOME W HOME HEALTH SERVICES  In-house referral  Clinical Social Worker      DC Planning Services  CM consult      PAC Choice  DURABLE MEDICAL EQUIPMENT  HOME HEALTH   Choice offered to / List presented to:  C-3 Spouse   DME arranged  3-N-1  Levan Hurst      DME agency  [facility]     HH arranged  HH-2 PT      [facility] agency  [facility]   Status of service:  Completed, signed off Medicare Important Message given?  NA - LOS <3 / Initial given by admissions (If response is "NO", the following Medicare IM given date fields will be blank) Date Medicare IM given:   Date Additional Medicare IM given:    Discharge Disposition:  HOME W HOME HEALTH SERVICES  Per UR Regulation:  Reviewed for med. necessity/level of care/duration of stay  If discussed at Long Length of Stay Meetings, dates discussed:    Comments:  10/28/2011 Raynelle Bring BSN CCM [phone] [facility] will provide HHpt with start date of tomorrow. Face sheet, HH orders, OP note, H&p sent to fax [phone] with confirmation.

## 2011-10-28 NOTE — Progress Notes (Signed)
Subjective: 1 Day Post-Op Procedure(s) (LRB): LUMBAR LAMINECTOMY/DECOMPRESSION MICRODISCECTOMY (N/A) Patient reports pain as 2 on 0-10 scale.    Objective: Vital signs in last 24 hours: Temp:  [97 F (36.1 C)-99.1 F (37.3 C)] 99.1 F (37.3 C) (08/16 0449) Pulse Rate:  [57-69] 66  (08/16 0449) Resp:  [13-20] 16  (08/16 0449) BP: (100-144)/(58-82) 117/78 mmHg (08/16 0449) SpO2:  [89 %-100 %] 100 % (08/16 0449) Weight:  [100.699 kg (222 lb)] 100.699 kg (222 lb) (08/15 1348)  Intake/Output from previous day: 08/15 0701 - 08/16 0700 In: 2237.5 [P.O.:240; I.V.:1887.5; IV Piggyback:110] Out: 2125 [Urine:2075; Blood:50] Intake/Output this shift:    No results found for this basename: HGB:5 in the last 72 hours No results found for this basename: WBC:2,RBC:2,HCT:2,PLT:2 in the last 72 hours  Basename 10/28/11 0429  NA 136  K 3.4*  CL 101  CO2 24  BUN 12  CREATININE 1.54*  GLUCOSE 128*  CALCIUM 8.9   No results found for this basename: LABPT:2,INR:2 in the last 72 hours  Neurologically intact Sensation intact distally Dorsiflexion/Plantar flexion intact Compartment soft _DVT Dsg C/D/I  Assessment/Plan: 1 Day Post-Op Procedure(s) (LRB): LUMBAR LAMINECTOMY/DECOMPRESSION MICRODISCECTOMY (N/A). Doing well Discharge home with home health. If needed after PT. Needs to check glucose. DC instructions given.  Gwenevere Goga C 10/28/2011, 7:03 AM

## 2011-10-28 NOTE — Op Note (Signed)
NAMEGAYLON, MELCHOR                ACCOUNT NO.:  [id number]  MEDICAL RECORD NO.:  [id number]  LOCATION:  1609                         FACILITY:  [facility]  PHYSICIAN:  Jene Every, M.D.    DATE OF BIRTH:  10/10/1965  DATE OF PROCEDURE:  10/27/2011 DATE OF DISCHARGE:                              OPERATIVE REPORT   PREOPERATIVE DIAGNOSIS:  Spinal stenosis, herniated nucleus pulposus foraminal L4-5, left.  POSTOPERATIVE DIAGNOSES: 1. Spinal stenosis, herniated nucleus pulposus foraminal L4-5, left. 2. Epidural lipomatosis. 3. Synovial cyst, L4-5.  PROCEDURE PERFORMED: 1. Microdecompression, L4-5, left with foraminotomies of L4 and L5. 2. Microdiskectomy, L4-5. 3. Removal of epidural lipoma. 4. Removal of synovial cyst, left L4-5 facet.  ANESTHESIA:  General.  ASSISTANT:  Jaquelyn Bitter. Chabon, P.A.  BRIEF HISTORY:  This is a 46 year old female with L4 radiculopathy, neural tension signs secondary to foraminal disk herniation at L4-5 compressing the 4 root, mild terminal weakness of the dermatome with dysesthesias, is indicated for decompression, failing conservative treatment.  MRI indicating foraminal disk herniation.  We discussed the risks and benefits including bleeding, infection, damage to neurovascular structures, DVT, PE, anesthetic complications, etc.  The patient has morbid obesity, and tobacco use, type 2 diabetes.  TECHNIQUE:  With the patient in supine position, after induction of adequate general anesthesia, a gram and a half of vancomycin due to her allergy to ciprofloxacin.  She was placed prone on the [place] frame. All bony prominences well padded.  Lumbar region was prepped and draped in the usual sterile fashion.  Two 18-gauge spinal needles were utilized to localize the L4-5 interspace, confirmed with x-ray.  Incision was made from spinous process of 4-5.  Subcutaneous tissue was dissected, electrocautery was utilized to achieve hemostasis.  Dorsolumbar  fascia identified, divided in line with skin incision.  Paraspinous muscle elevated from lamina 4 and 5.  McCullough retractor was placed. Confirmatory radiograph obtained.  Operating microscope was draped, and brought on to the surgical field.  We were at 3-4 initially, removed the retractor caudad to L4-5.  Within the interlaminar space, there was a synovial cyst exiting from the 4-5 facet posteriorly that was seen on the MRI.  This was removed with pituitary.  It was consistent with a synovial cyst, therefore not sent for pathology.  Hemilaminotomy of the caudad edge of 4 was performed with 2-mm Kerrison.  Straight curette was utilized to detach ligamentum flavum from the cephalad edge of 5 with the straight curette.  She had a hypertrophic superior articulating process of 5.  We used a micro curette to detach ligamentum flavum from the cephalad edge of 5.  Using a 2-mm Kerrison, we performed a foraminotomy of 5.  I then undercut the superior articulating facet of 5 with the 2-mm Kerrison.  Hypertrophic ligamentum was noted and removed from the interspace.  Fairly large epidural lipomatosis was noted when we removed the ligamentum flavum perhaps contributing to the compression.  This was removed.  Next, I identified the disk space and obtained the confirmatory radiographs.  I then performed a foraminotomy of 4 with the 4 root protected at all times.  Used hockey stick to sweep underneath the nerve and above  it.  There was no separate free fragment that was into the foramen of 4 within the space, within the disk extruding from that.  From left side of the operative field, we performed an annulotomy and copious portion of disk material was removed from the disk space.  Then from the opposite side of the operative field used a combination of the micro nerve hook and Woodson retractor to mobilize and retrieve the fragments of disk material, removed it with pituitary.  This was sent to  pathology for appropriate analysis.  I obtained a confirmatory radiograph.  Copious irrigation of the disk space was noted.  I checked beneath thecal sac, the axilla, the shoulder, the root 4 and 5; no residual disk herniation or neural compressive lesion.  We had 1 cm excursion of the 5 root medial pedicle without tension.  A hockey-stick probe passed freely up the foramen of 4.  Next, inspection revealed no CSF leakage or active bleeding.  We then used bipolar electrocautery to achieve hemostasis.  I then removed the [facility] retractor.  Paraspinous muscles were inspected.  No evidence of active bleeding.  Repaired the dorsolumbar fascia with 1 Vicryl interrupted figure-of-eight sutures, subcu with 2-0 Vicryl simple sutures.  Skin was reapproximated with 4-0 subcuticular Prolene.  Wound reinforced with Steri-Strips.  She had multiple layers of closure due to subcutaneous adipose tissue.  Placed supine on hospital bed, extubated without difficulty, and transported to recovery room in satisfactory condition.  The patient tolerated the procedure well.  No complications.     Jene Every, M.D.     Cordelia Pen  D:  10/27/2011  T:  10/28/2011  Job:  [id number]

## 2011-12-13 ENCOUNTER — Ambulatory Visit (INDEPENDENT_AMBULATORY_CARE_PROVIDER_SITE_OTHER): Payer: Medicare Other | Admitting: Family Medicine

## 2011-12-13 ENCOUNTER — Encounter: Payer: Self-pay | Admitting: Family Medicine

## 2011-12-13 VITALS — BP 97/63 | HR 71 | Temp 98.5°F | Ht 62.0 in | Wt 228.0 lb

## 2011-12-13 DIAGNOSIS — F339 Major depressive disorder, recurrent, unspecified: Secondary | ICD-10-CM

## 2011-12-13 DIAGNOSIS — L680 Hirsutism: Secondary | ICD-10-CM

## 2011-12-13 DIAGNOSIS — E119 Type 2 diabetes mellitus without complications: Secondary | ICD-10-CM

## 2011-12-13 LAB — POCT GLYCOSYLATED HEMOGLOBIN (HGB A1C): Hemoglobin A1C: 6.7

## 2011-12-13 LAB — TSH: TSH: 0.641 u[IU]/mL (ref 0.350–4.500)

## 2011-12-13 MED ORDER — AMITRIPTYLINE HCL 25 MG PO TABS: ORAL_TABLET | ORAL | Status: DC

## 2011-12-13 MED ORDER — OMEPRAZOLE 40 MG PO CPDR: 40.0000 mg | DELAYED_RELEASE_CAPSULE | Freq: Every day | ORAL | Status: DC

## 2011-12-13 MED ORDER — FLUTICASONE PROPIONATE 50 MCG/ACT NA SUSP: 2.0000 | Freq: Every day | NASAL | Status: DC

## 2011-12-13 NOTE — Assessment & Plan Note (Signed)
Likely from metabolic syndrome, will check W2N and TSH.

## 2011-12-13 NOTE — Assessment & Plan Note (Signed)
Not taking metformin, Will check A1C today.

## 2011-12-13 NOTE — Assessment & Plan Note (Signed)
Will try elavil as patient has not done well with SNRI's or SSRI's in the past.  She will f/u in two weeks with me.  She is to call and try to schedule an appointment with Dr. Pascal Lux for therapy.

## 2011-12-13 NOTE — Patient Instructions (Signed)
I am sorry you have been feeling so down.  Please call Dr. Pascal Lux to schedule a therapy appointment.  Please start taking the amitriptyline at bedtime, and increase the dose per the directions on the bottle.  Please come back and see me in two weeks so I can see how you are doing.   Please STOP taking the carvedilol (Coreg), and continue taking the propranolol.    For your heartburn, please start taking the omeprazole, one pill daily, you can increase to two pills if needed.   I will check your diabetes and thyroid to see if they are contributing to the hair on your chin.

## 2011-12-13 NOTE — Progress Notes (Signed)
  Subjective:    Patient ID: Theresa [name], female    DOB: 06-02-1965, 46 y.o.   MRN: [id number]  HPI  Ms. Cassel comes in for depression and insomina.  She has been through a lot lately with her lung cancer and her back, which she had surgery on.  She says that she is not sleeping, and that she feels sad, does not want to see other people, dose not feel hopeful.  She says she has tried Paxil, Zoloft, and cymbalta, none of which helped, but they made her feel like a zombie.  She denies SI/HI.   She also mentions that she has more and more hair growing on her face.  She was once on metformin for borderline diabetes, but is not taking that anymore.  She denies polyuria/polydypsia.    Past Medical History  Diagnosis Date  . Hypertension   . Hyperlipidemia   . Blindness, legal     glaucoma and retinitis  pigmentosa  . Glaucoma   . Onychomycosis   . Retinitis pigmentosa   . Chronic eczema   . Diabetes mellitus type II     diet controlled  . Fibromyalgia   . Anxiety   . Lung cancer     lung ca dx 2010  . Hx of radiation therapy 01-17-11 to 03-18-11    lung  . History of chemotherapy 01/2011 to 03/2011    concurrent w/radiation therapy  . Chronic headaches 10-17-11    migraines   Family History  Problem Relation Age of Onset  . Cancer Maternal Aunt     brain  . Depression Mother   . Depression Son    History  Substance Use Topics  . Smoking status: Current Every Day Smoker -- 0.5 packs/day for 30 years    Types: Cigarettes    Last Attempt to Quit: 11/13/2007  . Smokeless tobacco: Never Used   Comment: using chantix, hx 1 1/2 PPD(has decrease to 3 cigs per day)  . Alcohol Use: Yes     occasional     Review of Systems See HPI    Objective:   Physical Exam BP 97/63  Pulse 71  Temp 98.5 F (36.9 C) (Oral)  Ht 5\' 2"  (1.575 m)  Wt 228 lb (103.42 kg)  BMI 41.70 kg/m2 General appearance: alert, cooperative and no distress Throat: oral mucosa moist Psych: Memory, judgement,  insight in tact, patient does not appear to be responding to internal stimuli, no SI/HI PHQ9 = 19       Assessment & Plan:

## 2011-12-13 NOTE — Addendum Note (Signed)
Addended by: [name] on: 12/13/2011 12:35 PM   Modules accepted: Orders

## 2011-12-23 ENCOUNTER — Encounter: Payer: Self-pay | Admitting: *Deleted

## 2011-12-23 ENCOUNTER — Other Ambulatory Visit: Payer: Self-pay | Admitting: Family Medicine

## 2011-12-23 MED ORDER — CETIRIZINE HCL 10 MG PO TABS: 10.0000 mg | ORAL_TABLET | Freq: Every day | ORAL | Status: DC

## 2011-12-23 MED ORDER — HYDROXYZINE HCL 25 MG PO TABS: 25.0000 mg | ORAL_TABLET | Freq: Four times a day (QID) | ORAL | Status: DC | PRN

## 2011-12-23 NOTE — Telephone Encounter (Signed)
This encounter was created in error - please disregard.

## 2012-01-18 ENCOUNTER — Other Ambulatory Visit (HOSPITAL_BASED_OUTPATIENT_CLINIC_OR_DEPARTMENT_OTHER): Payer: Medicare Other | Admitting: Lab

## 2012-01-18 ENCOUNTER — Ambulatory Visit: Payer: Medicare Other

## 2012-01-18 ENCOUNTER — Ambulatory Visit (HOSPITAL_COMMUNITY)
Admission: RE | Admit: 2012-01-18 | Discharge: 2012-01-18 | Disposition: A | Discharge status: Home / Self Care | Payer: Medicare Other | Source: Ambulatory Visit | Attending: Internal Medicine | Admitting: Internal Medicine

## 2012-01-18 ENCOUNTER — Encounter (HOSPITAL_COMMUNITY): Payer: Self-pay

## 2012-01-18 VITALS — BP 143/9 | HR 79 | Temp 98.4°F

## 2012-01-18 DIAGNOSIS — Z923 Personal history of irradiation: Secondary | ICD-10-CM | POA: Insufficient documentation

## 2012-01-18 DIAGNOSIS — C3490 Malignant neoplasm of unspecified part of unspecified bronchus or lung: Secondary | ICD-10-CM | POA: Insufficient documentation

## 2012-01-18 DIAGNOSIS — C3410 Malignant neoplasm of upper lobe, unspecified bronchus or lung: Secondary | ICD-10-CM

## 2012-01-18 LAB — CBC WITH DIFFERENTIAL/PLATELET
BASO%: 0.3 % (ref 0.0–2.0)
Basophils Absolute: 0 10*3/uL (ref 0.0–0.1)
EOS%: 3 % (ref 0.0–7.0)
Eosinophils Absolute: 0.2 10*3/uL (ref 0.0–0.5)
HCT: 47.8 % — ABNORMAL HIGH (ref 34.8–46.6)
HGB: 16.3 g/dL — ABNORMAL HIGH (ref 11.6–15.9)
LYMPH%: 17.1 % (ref 14.0–49.7)
MCH: 30.1 pg (ref 25.1–34.0)
MCHC: 34.1 g/dL (ref 31.5–36.0)
MCV: 88.4 fL (ref 79.5–101.0)
MONO#: 0.8 10*3/uL (ref 0.1–0.9)
MONO%: 10.1 % (ref 0.0–14.0)
NEUT#: 5.2 10*3/uL (ref 1.5–6.5)
NEUT%: 69.5 % (ref 38.4–76.8)
Platelets: 219 10*3/uL (ref 145–400)
RBC: 5.41 10*6/uL (ref 3.70–5.45)
RDW: 13.1 % (ref 11.2–14.5)
WBC: 7.4 10*3/uL (ref 3.9–10.3)
lymph#: 1.3 10*3/uL (ref 0.9–3.3)
nRBC: 0 % (ref 0–0)

## 2012-01-18 LAB — COMPREHENSIVE METABOLIC PANEL (CC13)
ALT: 16 U/L (ref 0–55)
AST: 12 U/L (ref 5–34)
Albumin: 3.9 g/dL (ref 3.5–5.0)
Alkaline Phosphatase: 56 U/L (ref 40–150)
BUN: 11 mg/dL (ref 7.0–26.0)
CO2: 30 mEq/L — ABNORMAL HIGH (ref 22–29)
Calcium: 9.3 mg/dL (ref 8.4–10.4)
Chloride: 102 mEq/L (ref 98–107)
Creatinine: 1.4 mg/dL — ABNORMAL HIGH (ref 0.6–1.1)
Glucose: 167 mg/dl — ABNORMAL HIGH (ref 70–99)
Potassium: 3 mEq/L — ABNORMAL LOW (ref 3.5–5.1)
Sodium: 139 mEq/L (ref 136–145)
Total Bilirubin: 0.89 mg/dL (ref 0.20–1.20)
Total Protein: 7.2 g/dL (ref 6.4–8.3)

## 2012-01-18 MED ORDER — IOHEXOL 300 MG/ML  SOLN
80.0000 mL | Freq: Once | INTRAMUSCULAR | Status: AC | PRN
  Administered 2012-01-18: 80 mL via INTRAVENOUS

## 2012-01-18 MED ORDER — HEPARIN SOD (PORK) LOCK FLUSH 100 UNIT/ML IV SOLN
500.0000 [IU] | Freq: Once | INTRAVENOUS | Status: AC
  Administered 2012-01-18: 500 [IU] via INTRAVENOUS
  Filled 2012-01-18: qty 5

## 2012-01-18 MED ORDER — SODIUM CHLORIDE 0.9 % IJ SOLN
10.0000 mL | INTRAMUSCULAR | Status: DC | PRN
  Administered 2012-01-18: 10 mL via INTRAVENOUS
  Filled 2012-01-18: qty 10

## 2012-01-18 NOTE — Progress Notes (Signed)
Patient accessed with power loc( right port) and left accessed for CT this am.

## 2012-01-18 NOTE — Patient Instructions (Addendum)
Call MD for problems 

## 2012-01-18 NOTE — Progress Notes (Signed)
Quick Note:  Call patient with the result and order K Dur 20 meq po qd X 7 ______ 

## 2012-01-19 ENCOUNTER — Telehealth: Payer: Self-pay | Admitting: *Deleted

## 2012-01-19 DIAGNOSIS — E876 Hypokalemia: Secondary | ICD-10-CM

## 2012-01-19 MED ORDER — POTASSIUM CHLORIDE CRYS ER 20 MEQ PO TBCR: 20.0000 meq | EXTENDED_RELEASE_TABLET | ORAL | Status: DC

## 2012-01-19 NOTE — Telephone Encounter (Signed)
Message copied by Caren Griffins on Thu Jan 19, 2012 11:08 AM ------      Message from: Si Gaul      Created: Wed Jan 18, 2012 10:40 AM       Call patient with the result and order K Dur 20 meq po qd X 7

## 2012-01-19 NOTE — Telephone Encounter (Signed)
Left msg on pt vm regarding taking K x 7 days.  SLJ

## 2012-01-23 ENCOUNTER — Ambulatory Visit: Payer: Medicare Other | Admitting: Internal Medicine

## 2012-01-23 ENCOUNTER — Other Ambulatory Visit: Payer: Self-pay | Admitting: *Deleted

## 2012-01-23 ENCOUNTER — Telehealth: Payer: Self-pay | Admitting: Internal Medicine

## 2012-01-23 NOTE — Telephone Encounter (Signed)
l/m for her to call and r/s ftka on 11/11    aom

## 2012-03-03 ENCOUNTER — Other Ambulatory Visit: Payer: Self-pay | Admitting: Family Medicine

## 2012-04-24 ENCOUNTER — Other Ambulatory Visit: Payer: Self-pay | Admitting: Family Medicine

## 2012-04-24 ENCOUNTER — Telehealth: Payer: Self-pay | Admitting: Internal Medicine

## 2012-04-24 NOTE — Telephone Encounter (Signed)
returned pt call and r/s est..Marland KitchenMarland KitchenDone

## 2012-05-01 ENCOUNTER — Ambulatory Visit (HOSPITAL_BASED_OUTPATIENT_CLINIC_OR_DEPARTMENT_OTHER): Payer: Medicare Other | Admitting: Internal Medicine

## 2012-05-01 ENCOUNTER — Ambulatory Visit: Payer: Medicare Other

## 2012-05-01 ENCOUNTER — Encounter: Payer: Self-pay | Admitting: Internal Medicine

## 2012-05-01 ENCOUNTER — Telehealth: Payer: Self-pay | Admitting: Internal Medicine

## 2012-05-01 VITALS — BP 113/76 | HR 71 | Temp 97.3°F | Resp 20 | Ht 62.0 in | Wt 224.7 lb

## 2012-05-01 DIAGNOSIS — C3490 Malignant neoplasm of unspecified part of unspecified bronchus or lung: Secondary | ICD-10-CM

## 2012-05-01 DIAGNOSIS — Z85118 Personal history of other malignant neoplasm of bronchus and lung: Secondary | ICD-10-CM

## 2012-05-01 MED ORDER — SODIUM CHLORIDE 0.9 % IJ SOLN
10.0000 mL | INTRAMUSCULAR | Status: DC | PRN
  Administered 2012-05-01: 10 mL via INTRAVENOUS
  Filled 2012-05-01: qty 10

## 2012-05-01 MED ORDER — HEPARIN SOD (PORK) LOCK FLUSH 100 UNIT/ML IV SOLN
500.0000 [IU] | Freq: Once | INTRAVENOUS | Status: AC
  Administered 2012-05-01: 500 [IU] via INTRAVENOUS
  Filled 2012-05-01: qty 5

## 2012-05-01 NOTE — Progress Notes (Signed)
[facility] [phone]   [phone]  OFFICE PROGRESS NOTE  [name], MD [address] [place] [address]  DIAGNOSIS: Recurrent non-small cell lung cancer initially diagnosed as stage IA (T1b, N0, MX) adenocarcinoma in November 2009.   PRIOR THERAPY:  #1 Status post left upper lobe trisegmentectomy with lymph node dissection under the care of Dr. Edwyna Shell on March 24, 2009.  #2 Concurrent chemoradiation with carboplatin for AUC of 2 and paclitaxel 45 mg/M2 given weekly with radiation.   CURRENT THERAPY: Observation.  INTERVAL HISTORY: Theresa [name] 47 y.o. female returns to the clinic today for six-month followup visit. The patient is feeling fine today with no specific complaints. She denied having any significant chest pain, shortness breath, cough or hemoptysis. She denied having any significant weight loss or night sweats. She missed the Port-A-Cath flush for the last 6 months because of back surgery and inability to come to the cancer Center. She had repeat CT scan of the chest performed recently and she is here for evaluation and discussion of her scan results.   MEDICAL HISTORY: Past Medical History  Diagnosis Date  . Hypertension   . Hyperlipidemia   . Blindness, legal     glaucoma and retinitis  pigmentosa  . Glaucoma   . Onychomycosis   . Retinitis pigmentosa   . Chronic eczema   . Diabetes mellitus type II     diet controlled  . Fibromyalgia   . Anxiety   . Lung cancer     lung ca dx 2010  . Hx of radiation therapy 01-17-11 to 03-18-11    lung  . History of chemotherapy 01/2011 to 03/2011    concurrent w/radiation therapy  . Chronic headaches 10-17-11    migraines    ALLERGIES:  is allergic to morphine and related; codeine; and penicillins.  MEDICATIONS:  Current Outpatient Prescriptions  Medication Sig Dispense Refill  . amitriptyline (ELAVIL) 25 MG tablet Take one tablet PO qhs for 3 days, then  increase to two tablets PO qhs for 3 days, then increase to three tablets PO qhs for 3 days.  60 tablet  1  . amLODipine (NORVASC) 10 MG tablet Take 10 mg by mouth every morning.      . carvedilol (COREG) 25 MG tablet TAKE ONE TABLET BY MOUTH TWICE DAILY  60 tablet  5  . fluticasone (FLONASE) 50 MCG/ACT nasal spray Place 2 sprays into the nose daily.  16 g  6  . gabapentin (NEURONTIN) 300 MG capsule Take 1 capsule (300 mg total) by mouth 3 (three) times daily.  90 capsule  3  . omeprazole (PRILOSEC) 40 MG capsule Take 1 capsule (40 mg total) by mouth daily.  30 capsule  11  . propranolol ER (INDERAL LA) 80 MG 24 hr capsule TAKE 1 CAPSULE DAILY  30 capsule  11  . TRIAMCINOLONE ACETONIDE, TOP, 0.05 % OINT Apply 1 application topically 2 (two) times daily.  17 g  0  . valsartan-hydrochlorothiazide (DIOVAN-HCT) 160-12.5 MG per tablet Take 1 tablet by mouth every morning.      . varenicline (CHANTIX) 0.5 MG tablet Take 0.5 mg by mouth 2 (two) times daily.      Marland Kitchen albuterol (PROVENTIL HFA;VENTOLIN HFA) 108 (90 BASE) MCG/ACT inhaler Inhale 2 puffs into the lungs every 4 (four) hours as needed. For wheezing       No current facility-administered medications for this visit.    SURGICAL HISTORY:  Past  Surgical History  Procedure Laterality Date  . Lung surgery  03/24/08    L vats, L thoracotomy and LUL trisegmentectomy with node dissection  . Chest tube insertion   04/01/08    L hydropneumothorax  . Back surgery    . Eye surgery      lens implant  . Portacath placement  02/11/2011    Procedure: INSERTION PORT-A-CATH;  Surgeon: Norton Blizzard, MD;  Location: [facility] OR;  Service: Thoracic;  Laterality: Right;  9.6Fr. Pre-attached Power Port in Right Internal Jugular  -right chest-remains inplace 10-17-11  . Abdominal hysterectomy  10-17-11    hx. of  . Knee arthroscopy  10-17-11    bil. knee scope(left was torn ligament)  . Lumbar laminectomy/decompression microdiscectomy  10/27/2011    Procedure: LUMBAR  LAMINECTOMY/DECOMPRESSION MICRODISCECTOMY;  Surgeon: Javier Docker, MD;  Location: [facility] ORS;  Service: Orthopedics;  Laterality: N/A;  L4-L5    REVIEW OF SYSTEMS:  A comprehensive review of systems was negative.   PHYSICAL EXAMINATION: General appearance: alert, cooperative and no distress Head: Normocephalic, without obvious abnormality, atraumatic Neck: no adenopathy Lymph nodes: Cervical, supraclavicular, and axillary nodes normal. Resp: clear to auscultation bilaterally Cardio: regular rate and rhythm, S1, S2 normal, no murmur, click, rub or gallop GI: soft, non-tender; bowel sounds normal; no masses,  no organomegaly Extremities: extremities normal, atraumatic, no cyanosis or edema  ECOG PERFORMANCE STATUS: 1 - Symptomatic but completely ambulatory  Blood pressure 113/76, pulse 71, temperature 97.3 F (36.3 C), temperature source Oral, resp. rate 20, height 5\' 2"  (1.575 m), weight 224 lb 11.2 oz (101.923 kg).  LABORATORY DATA: Lab Results  Component Value Date   WBC 7.4 01/18/2012   HGB 16.3* 01/18/2012   HCT 47.8* 01/18/2012   MCV 88.4 01/18/2012   PLT 219 01/18/2012      Chemistry      Component Value Date/Time   NA 139 01/18/2012 0805   NA 136 10/28/2011 0429   NA 141 07/14/2011 0934   K 3.0* 01/18/2012 0805   K 3.4* 10/28/2011 0429   K 3.4 07/14/2011 0934   CL 102 01/18/2012 0805   CL 101 10/28/2011 0429   CL 100 07/14/2011 0934   CO2 30* 01/18/2012 0805   CO2 24 10/28/2011 0429   CO2 30 07/14/2011 0934   BUN 11.0 01/18/2012 0805   BUN 12 10/28/2011 0429   BUN 13 07/14/2011 0934   CREATININE 1.4* 01/18/2012 0805   CREATININE 1.54* 10/28/2011 0429   CREATININE 1.21* 07/29/2011 1021      Component Value Date/Time   CALCIUM 9.3 01/18/2012 0805   CALCIUM 8.9 10/28/2011 0429   CALCIUM 8.7 07/14/2011 0934   ALKPHOS 56 01/18/2012 0805   ALKPHOS 41 07/29/2011 1021   ALKPHOS 38 07/14/2011 0934   AST 12 01/18/2012 0805   AST 16 07/29/2011 1021   AST 19 07/14/2011 0934   ALT 16 01/18/2012 0805    ALT 13 07/29/2011 1021   BILITOT 0.89 01/18/2012 0805   BILITOT 1.1 07/29/2011 1021   BILITOT 1.20 07/14/2011 0934       RADIOGRAPHIC STUDIES: No results found.  ASSESSMENT: This is a very pleasant 47 years old [place] American female with recurrent non-small cell lung cancer status post concurrent chemoradiation completed in December of 2012 and has been observation since that time was no evidence for disease progression.  PLAN: I discussed the scan results with the patient and her daughter. I recommended for her to continue on observation with repeat CT  scan of the chest in 6 months. I will arrange for the patient to have Port-A-Cath flushed today and every 6 weeks. She was advised to call immediately if she has any concerning symptoms in the interval.  All questions were answered. The patient knows to call the clinic with any problems, questions or concerns. We can certainly see the patient much sooner if necessary.

## 2012-05-01 NOTE — Patient Instructions (Signed)
No evidence for disease progression on his recent scan. Followup in 6 months with repeat CT scan of the chest 

## 2012-05-09 ENCOUNTER — Other Ambulatory Visit: Payer: Self-pay | Admitting: Family Medicine

## 2012-05-11 ENCOUNTER — Encounter: Payer: Self-pay | Admitting: Family Medicine

## 2012-05-11 ENCOUNTER — Ambulatory Visit (INDEPENDENT_AMBULATORY_CARE_PROVIDER_SITE_OTHER): Payer: Medicare Other | Admitting: Family Medicine

## 2012-05-11 VITALS — BP 134/85 | HR 69 | Ht 62.0 in | Wt 225.0 lb

## 2012-05-11 DIAGNOSIS — M549 Dorsalgia, unspecified: Secondary | ICD-10-CM

## 2012-05-11 DIAGNOSIS — N951 Menopausal and female climacteric states: Secondary | ICD-10-CM

## 2012-05-11 DIAGNOSIS — E119 Type 2 diabetes mellitus without complications: Secondary | ICD-10-CM

## 2012-05-11 DIAGNOSIS — L680 Hirsutism: Secondary | ICD-10-CM

## 2012-05-11 DIAGNOSIS — Z85118 Personal history of other malignant neoplasm of bronchus and lung: Secondary | ICD-10-CM

## 2012-05-11 DIAGNOSIS — F17200 Nicotine dependence, unspecified, uncomplicated: Secondary | ICD-10-CM

## 2012-05-11 DIAGNOSIS — R232 Flushing: Secondary | ICD-10-CM | POA: Insufficient documentation

## 2012-05-11 LAB — BASIC METABOLIC PANEL
BUN: 9 mg/dL (ref 6–23)
CO2: 25 mEq/L (ref 19–32)
Calcium: 9.9 mg/dL (ref 8.4–10.5)
Chloride: 100 mEq/L (ref 96–112)
Creat: 1.39 mg/dL — ABNORMAL HIGH (ref 0.50–1.10)
Glucose, Bld: 210 mg/dL — ABNORMAL HIGH (ref 70–99)
Potassium: 3.7 mEq/L (ref 3.5–5.3)
Sodium: 138 mEq/L (ref 135–145)

## 2012-05-11 LAB — POCT GLYCOSYLATED HEMOGLOBIN (HGB A1C): Hemoglobin A1C: 8

## 2012-05-11 MED ORDER — EFLORNITHINE HCL 13.9 % EX CREA: 1.0000 "application " | TOPICAL_CREAM | Freq: Two times a day (BID) | CUTANEOUS | Status: DC

## 2012-05-11 MED ORDER — VENLAFAXINE HCL ER 75 MG PO CP24: 75.0000 mg | ORAL_CAPSULE | Freq: Every day | ORAL | Status: DC

## 2012-05-11 MED ORDER — GABAPENTIN 300 MG PO CAPS: 300.0000 mg | ORAL_CAPSULE | Freq: Three times a day (TID) | ORAL | Status: DC

## 2012-05-11 NOTE — Assessment & Plan Note (Signed)
A: patient reports that she is no longer smoking P: continue chantix. F/u with PCP for taper.

## 2012-05-11 NOTE — Progress Notes (Signed)
Subjective:     Patient ID: Theresa [name], female   DOB: [birth year], 47 y.o.   MRN: [id number]  HPI 47 yo F presents for same day visit to discuss the following:  1. Hirsutism: x 8 months. Hair growth on cheeks, chin, neck, back, stomach and behind knees. No previous history of hirsutism. She is s/p chemo for non-small cell lung cancer. She is s/p hysterectomy in 2003 for fibroids. She still has her ovaries. She has quit smoking. Denies weight loss, appetite changes, fever, chills. She was seen in October for this issue, at that time A1c was elevated to 6.7 and TSH was normal.   2. Hot flashes: had hot flashes immediately after hysterectomy that resolved. Hot flashes have returned. They occur nightly and during the day. Associated with diffuse sweating. She has histor of neuropathic pain, previously on cymbalta, and depression, previously on zoloft. She admits to continued depressed mood. She stopped elavil because it made her affect flat.   Review of Systems As per HPI     Objective:   Physical Exam BP 134/85  Pulse 69  Ht 5\' 2"  (1.575 m)  Wt 225 lb (102.059 kg)  BMI 41.14 kg/m2 Wt Readings from Last 3 Encounters:  05/11/12 225 lb (102.059 kg)  05/01/12 224 lb 11.2 oz (101.923 kg)  12/13/11 228 lb (103.42 kg)   General appearance: alert, cooperative, no distress and morbidly obese Skin: evidence of female pattern hair growth and face and back. thin hair on stomach.   Reviewed hysterectomy op report and path to confirm that patient still has ovaries.     Assessment and Plan:

## 2012-05-11 NOTE — Patient Instructions (Addendum)
Theresa [name],  Thank you for coming in today.  Regarding hirsutism:  1. You do have your ovaries still-I would like to evaluate them with an ultrasound before starting oral medication. Please go for ultrasound. 2. In the meantime,  I have prescribed vaniq which is topical cream that should reduce hair growth. You may continue to shave. After you shave, clean face gently. And apply a finger tip size amount of 1% hydrocortisone cream to reduce inflammation.  Getting a BMP to check baseline Cr and potassium.   Regarding hot flashes: Related to menopause.  Start effexor 75 mg daily in the morning to help with depressed mood and relief of hot flashes. Avoid well known triggers: chocolate, caffeine, hot drinks. Keep a hand fan handy. Work out daily.   I will call with ultrasound and lab results.  Dr. Armen Pickup

## 2012-05-11 NOTE — Assessment & Plan Note (Addendum)
A: suspect menopausal hot flashes. P:  Avoid well known triggers: chocolate, caffeine, hot drinks. Keep a hand fan handy. Work out daily.  Start effexor 75 mg daily in the morning to help with depressed mood, chronic pain and relief of hot flashes.

## 2012-05-11 NOTE — Assessment & Plan Note (Signed)
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 Less likely etiology is ovarian pathology, hyperthecosis or malignancy. No virilization or constitutional symptoms. Discussed the need for additional work-up before prescribing medication. Also discussed that hirsutism is extremely difficult to treat, especially in African American patients,  and there is no magic bullet. P:  Rechecking A1c Pelvic/TVUS to rule out ovarian pathology.  CMP- to check Cr and K- spirolactone at a dose of 50 mg daily is a possible therapy, but it will have to be started at low dose 12.5 mg, slowly titrated and closely monitored given her history of CKD.  For now,  Prescribed vaniqua (eflornitine topical) BID Recommend continued shaving prn, followed by careful skin cleansing and application of 1% hydrocortisone cream to minimize post inflammatory hyperpigmentation.

## 2012-05-14 ENCOUNTER — Encounter: Payer: Self-pay | Admitting: Family Medicine

## 2012-05-14 ENCOUNTER — Telehealth: Payer: Self-pay | Admitting: Family Medicine

## 2012-05-14 DIAGNOSIS — E119 Type 2 diabetes mellitus without complications: Secondary | ICD-10-CM

## 2012-05-14 MED ORDER — ONETOUCH ULTRASOFT LANCETS MISC: Status: DC

## 2012-05-14 MED ORDER — GLUCOSE BLOOD VI STRP: ORAL_STRIP | Status: DC

## 2012-05-14 MED ORDER — GLIPIZIDE 5 MG PO TABS: 5.0000 mg | ORAL_TABLET | Freq: Two times a day (BID) | ORAL | Status: DC

## 2012-05-14 MED ORDER — ONETOUCH ULTRA 2 W/DEVICE KIT: 1.0000 | PACK | Status: DC

## 2012-05-14 NOTE — Telephone Encounter (Signed)
Called patient. Reviewed labs. Sent letter. Plan to start glipizide 5 mg BID with food.  Patient requested meter. Called in one touch diabetic meter with supplies. Patient declined diabetic teaching.   Will call patient back with [place] results.   Patient states effexor is working well. Patient asked if I could be her PCP b/c she felt that we developed a good rapport. Advised patient that PCP change request have to go through administration.

## 2012-05-16 ENCOUNTER — Ambulatory Visit (HOSPITAL_COMMUNITY)
Admission: RE | Admit: 2012-05-16 | Discharge: 2012-05-16 | Disposition: A | Discharge status: Home / Self Care | Payer: Medicare Other | Source: Ambulatory Visit | Attending: Family Medicine | Admitting: Family Medicine

## 2012-05-16 DIAGNOSIS — Z90710 Acquired absence of both cervix and uterus: Secondary | ICD-10-CM | POA: Insufficient documentation

## 2012-05-16 DIAGNOSIS — Z85118 Personal history of other malignant neoplasm of bronchus and lung: Secondary | ICD-10-CM | POA: Insufficient documentation

## 2012-05-16 DIAGNOSIS — L680 Hirsutism: Secondary | ICD-10-CM | POA: Insufficient documentation

## 2012-05-16 DIAGNOSIS — N7013 Chronic salpingitis and oophoritis: Secondary | ICD-10-CM | POA: Insufficient documentation

## 2012-05-17 ENCOUNTER — Telehealth: Payer: Self-pay | Admitting: Family Medicine

## 2012-05-17 NOTE — Telephone Encounter (Signed)
Called patient. Normal vaginal ultrasound except small R hydrosalpinx (fluid filled cyst) Advised that no treatment is needed for this.   I still think aldactone is a reasonable option for hirsutism. However, I informed patient that if she starts this her Cr and K+ will both need to be monitored closely.   Recommend she schedule f/u with PCP to make sure her PCP agrees and is willing to do the necessary f/u.  Patient is taking glipizide. Has not obtained glucometer.   Patient agrees to f/u and voiced understanding.

## 2012-05-24 ENCOUNTER — Encounter: Payer: Self-pay | Admitting: Family Medicine

## 2012-05-24 NOTE — Telephone Encounter (Signed)
Error

## 2012-05-24 NOTE — Telephone Encounter (Signed)
This encounter was created in error - please disregard.

## 2012-05-25 ENCOUNTER — Ambulatory Visit (INDEPENDENT_AMBULATORY_CARE_PROVIDER_SITE_OTHER): Payer: Medicare Other | Admitting: Family Medicine

## 2012-05-25 VITALS — BP 112/80 | HR 87 | Ht 62.0 in | Wt 223.0 lb

## 2012-05-25 DIAGNOSIS — N951 Menopausal and female climacteric states: Secondary | ICD-10-CM

## 2012-05-25 DIAGNOSIS — E785 Hyperlipidemia, unspecified: Secondary | ICD-10-CM

## 2012-05-25 DIAGNOSIS — R232 Flushing: Secondary | ICD-10-CM

## 2012-05-25 DIAGNOSIS — L680 Hirsutism: Secondary | ICD-10-CM

## 2012-05-25 DIAGNOSIS — E119 Type 2 diabetes mellitus without complications: Secondary | ICD-10-CM

## 2012-05-25 LAB — LIPID PANEL
Cholesterol: 259 mg/dL — ABNORMAL HIGH (ref 0–200)
HDL: 39 mg/dL — ABNORMAL LOW (ref >39)
LDL Cholesterol: 204 mg/dL — ABNORMAL HIGH (ref 0–99)
Total CHOL/HDL Ratio: 6.6 Ratio
Triglycerides: 81 mg/dL (ref <150)
VLDL: 16 mg/dL (ref 0–40)

## 2012-05-25 MED ORDER — GLIPIZIDE 5 MG PO TABS: 5.0000 mg | ORAL_TABLET | Freq: Two times a day (BID) | ORAL | Status: DC

## 2012-05-25 MED ORDER — CARVEDILOL 25 MG PO TABS: 25.0000 mg | ORAL_TABLET | Freq: Two times a day (BID) | ORAL | Status: DC

## 2012-05-25 MED ORDER — VARENICLINE TARTRATE 0.5 MG PO TABS: 0.5000 mg | ORAL_TABLET | Freq: Two times a day (BID) | ORAL | Status: DC

## 2012-05-25 MED ORDER — VENLAFAXINE HCL ER 150 MG PO CP24: 150.0000 mg | ORAL_CAPSULE | Freq: Every day | ORAL | Status: DC

## 2012-05-25 NOTE — Patient Instructions (Signed)
It was good to see you.  Your blood pressure today was BP: 112/80 mmHg.  Remember your goal blood pressure is about 130/80.  Please be sure to take your medication every day.    Please try Black cohosh for your hot flashes.  I want you to increase your effexor to 150 mg daily- I sent in a new prescription for a larger dose pill, but you can take two a day of the pills you have at home until those are gone.   Please continue the face cream daily, and come back and see mine in 4 weeks.   I will send you a letter with your lab results, or call you if anything is abnormal.

## 2012-05-25 NOTE — Assessment & Plan Note (Signed)
A1C last visit was 8, will continue glipizide.  I have filled out the paperwork so she can get a glucometer, I have asked her to check it once daily fasting.

## 2012-05-25 NOTE — Assessment & Plan Note (Signed)
Discussed menopause, normal pelvic US- suggested black cohesh for therapy.  I will also increase her effexor to help with mood swings.

## 2012-05-25 NOTE — Assessment & Plan Note (Signed)
Spent a great deal of time discussing metabolic syndrome, hirsutism.  She has just started the eflornithine cream less than a week ago, so I will have her follow up in 4 weeks to see how it is working.  If needed can consider spironolactone. Discussed weight management and management of DM and HLD as important contributors to hirsutism.

## 2012-05-25 NOTE — Progress Notes (Signed)
  Subjective:    Patient ID: Theresa [name], female    DOB: [birth year], 47 y.o.   MRN: [id number]  HPI:  Theresa [name] comes in for follow up:   Hirsutism: Did not get the Eflornithine filled until about 5 days ago.  She says she has noticed maybe she does not have to shave quite as soon as she did before.  She does feel like the acne improved a little.  She has multiple questions about the facial hair growth.   Hot Flashes: still having them, has had hysterectomy but ovaries in place.  Vaginal US WNL.  Has not tried any medications for hot flashes yet.  She says her mom was mid- 83's when she went through menopause. Theresa [name] complains also of mood swings, says her husband is afraid of her.    DM: Taking glipizide 5 mg daily, has not been able to get glucometer yet because I need to fill out the paperwork.    HLD: last lipid profile >2 years ago, she does not take a cholesterol pill though.    Lab Results  Component Value Date   CHOL 227* 11/06/2009   HDL 39* 11/06/2009   LDLCALC 166* 11/06/2009   LDLDIRECT 157* 07/29/2011   TRIG 110 11/06/2009   CHOLHDL 5.8 Ratio 11/06/2009     Past Medical History  Diagnosis Date  . Hypertension   . Hyperlipidemia   . Blindness, legal     glaucoma and retinitis  pigmentosa  . Glaucoma   . Onychomycosis   . Retinitis pigmentosa   . Chronic eczema   . Diabetes mellitus type II     diet controlled  . Fibromyalgia   . Anxiety   . Lung cancer     lung ca dx 2010  . Hx of radiation therapy 01-17-11 to 03-18-11    lung  . History of chemotherapy 01/2011 to 03/2011    concurrent w/radiation therapy  . Chronic headaches 10-17-11    migraines    History  Substance Use Topics  . Smoking status: Current Every Day Smoker -- 0.50 packs/day for 30 years    Types: Cigarettes    Last Attempt to Quit: 11/13/2007  . Smokeless tobacco: Never Used     Comment: using chantix, hx 1 1/2 PPD(has decrease to 3 cigs per day)  . Alcohol Use: Yes     Comment: occasional     Family History  Problem Relation Age of Onset  . Cancer Maternal Aunt     brain  . Depression Mother   . Depression Son      ROS Pertinent items in HPI    Objective:  Physical Exam:  BP 112/80  Pulse 87  Ht 5\' 2"  (1.575 m)  Wt 223 lb (101.152 kg)  BMI 40.78 kg/m2 General appearance: alert, cooperative and no distress Head: Normocephalic, without obvious abnormality, atraumatic Face: Female pattern facial hair (recently shaved) with fading comedones.  Lungs: clear to auscultation bilaterally Heart: regular rate and rhythm, S1, S2 normal, no murmur, click, rub or gallop Pulses: 2+ and symmetric       Assessment & Plan:

## 2012-05-28 ENCOUNTER — Telehealth: Payer: Self-pay | Admitting: Family Medicine

## 2012-05-28 NOTE — Telephone Encounter (Signed)
Called, left voicemail.  Cholesterol high at recent visit, I reccomend a cholesterol pill for this. I asked her to call back to speak to me about this.    Lyn Joens 05/28/2012 2:50 PM

## 2012-06-01 ENCOUNTER — Telehealth: Payer: Self-pay | Admitting: Family Medicine

## 2012-06-01 DIAGNOSIS — E119 Type 2 diabetes mellitus without complications: Secondary | ICD-10-CM

## 2012-06-01 NOTE — Telephone Encounter (Signed)
Patient is calling because [facility] has not received the paperwork back for her Meter and test strips so since her last appt, she has not been able to check her blood sugars.  She needs this to be sent in as soon as possible.  The patient would like a call when this has been done.

## 2012-06-03 NOTE — Telephone Encounter (Signed)
Per Dr. Melina Modena notes, paperwork had been filled out 03/14. I have LVM with patient and asked her to call our office back and let us know either number/fax number to call or better if she would have [facility] re-send forms for glucometer/strips.

## 2012-06-04 NOTE — Telephone Encounter (Signed)
Calling back with fax # [phone]

## 2012-06-04 NOTE — Telephone Encounter (Signed)
Will forward to MD.Romond Pipkins, Baxter Hire L

## 2012-06-05 MED ORDER — ONETOUCH ULTRA 2 W/DEVICE KIT: 1.0000 | PACK | Status: DC

## 2012-06-05 NOTE — Addendum Note (Signed)
Addended by: Priscella Mann J on: 06/05/2012 01:31 PM   Modules accepted: Orders

## 2012-06-05 NOTE — Telephone Encounter (Signed)
Baxter Hire, will you please fax? Thank you. I will put Rx on your computer.

## 2012-06-07 NOTE — Telephone Encounter (Signed)
Due to this being a Medicare pt., will have preceptor write Rx this afternoon.  Emilie Rutter, Darlyne Russian

## 2012-06-12 ENCOUNTER — Ambulatory Visit: Payer: Medicare Other

## 2012-06-12 NOTE — Progress Notes (Signed)
No Show.  Sent to scheduling. 

## 2012-06-13 ENCOUNTER — Ambulatory Visit (HOSPITAL_BASED_OUTPATIENT_CLINIC_OR_DEPARTMENT_OTHER): Payer: Medicare Other

## 2012-06-13 DIAGNOSIS — Z452 Encounter for adjustment and management of vascular access device: Secondary | ICD-10-CM

## 2012-06-13 DIAGNOSIS — C3410 Malignant neoplasm of upper lobe, unspecified bronchus or lung: Secondary | ICD-10-CM

## 2012-06-13 MED ORDER — SODIUM CHLORIDE 0.9 % IJ SOLN
10.0000 mL | INTRAMUSCULAR | Status: DC | PRN
  Administered 2012-06-13: 10 mL via INTRAVENOUS
  Filled 2012-06-13: qty 10

## 2012-06-13 MED ORDER — HEPARIN SOD (PORK) LOCK FLUSH 100 UNIT/ML IV SOLN
500.0000 [IU] | Freq: Once | INTRAVENOUS | Status: AC
  Administered 2012-06-13: 500 [IU] via INTRAVENOUS
  Filled 2012-06-13: qty 5

## 2012-06-25 ENCOUNTER — Ambulatory Visit (INDEPENDENT_AMBULATORY_CARE_PROVIDER_SITE_OTHER): Payer: Medicare Other | Admitting: Family Medicine

## 2012-06-25 ENCOUNTER — Encounter: Payer: Self-pay | Admitting: Family Medicine

## 2012-06-25 VITALS — BP 124/88 | HR 78 | Ht 62.0 in | Wt 227.0 lb

## 2012-06-25 DIAGNOSIS — E785 Hyperlipidemia, unspecified: Secondary | ICD-10-CM

## 2012-06-25 DIAGNOSIS — L680 Hirsutism: Secondary | ICD-10-CM

## 2012-06-25 DIAGNOSIS — E669 Obesity, unspecified: Secondary | ICD-10-CM

## 2012-06-25 DIAGNOSIS — I10 Essential (primary) hypertension: Secondary | ICD-10-CM

## 2012-06-25 MED ORDER — SPIRONOLACTONE 50 MG PO TABS: 50.0000 mg | ORAL_TABLET | Freq: Every day | ORAL | Status: DC

## 2012-06-25 MED ORDER — ATORVASTATIN CALCIUM 40 MG PO TABS: 40.0000 mg | ORAL_TABLET | Freq: Every day | ORAL | Status: DC

## 2012-06-25 NOTE — Progress Notes (Signed)
  Subjective:    Patient ID: Theresa [name], female    DOB: [birth year], 47 y.o.   MRN: [id number]  HPI:  Theresa [name] comes in for follow up.   HLD: lipid profile from last visit was elevated, she wants to know "how high" and if she really needs to take a cholesterol pill.  She is trying to increase her exercise but has quite a bit of back and hip pain.   HTN: taking valsartan-hctz, amlodipine, and coreg.  Denies chest pain on exertion, she does have some chronic LE edema.    Hirsutism: Tried Eflornithine cream, but says it made her face itch and did not make much difference in the facial hair.  She understands that weight loss and controlling metabolic syndrome is the most important therapy for the facial hair.    Obesity: has gained 5 lbs in last month.  Is frustrated.  Again in a lot of pain and exercise is hard. Her husband is here with her today and says they are going to try to walk more.  She likes a lot of vegetables.   Past Medical History  Diagnosis Date  . Hypertension   . Hyperlipidemia   . Blindness, legal     glaucoma and retinitis  pigmentosa  . Glaucoma   . Onychomycosis   . Retinitis pigmentosa   . Chronic eczema   . Diabetes mellitus type II     diet controlled  . Fibromyalgia   . Anxiety   . Lung cancer     lung ca dx 2010  . Hx of radiation therapy 01-17-11 to 03-18-11    lung  . History of chemotherapy 01/2011 to 03/2011    concurrent w/radiation therapy  . Chronic headaches 10-17-11    migraines    History  Substance Use Topics  . Smoking status: Current Every Day Smoker -- 0.50 packs/day for 30 years    Types: Cigarettes    Last Attempt to Quit: 11/13/2007  . Smokeless tobacco: Never Used     Comment: using chantix, hx 1 1/2 PPD(has decrease to 3 cigs per day)  . Alcohol Use: Yes     Comment: occasional    Family History  Problem Relation Age of Onset  . Cancer Maternal Aunt     brain  . Depression Mother   . Depression Son      ROS Pertinent  items in HPI    Objective:  Physical Exam:  BP 124/88  Pulse 78  Ht 5\' 2"  (1.575 m)  Wt 227 lb (102.967 kg)  BMI 41.51 kg/m2 General appearance: alert, cooperative and no distress Head: Normocephalic, without obvious abnormality, atraumatic Lungs: clear to auscultation bilaterally Heart: regular rate and rhythm, S1, S2 normal, no murmur, click, rub or gallop Pulses: 2+ and symmetric       Assessment & Plan:

## 2012-06-25 NOTE — Assessment & Plan Note (Signed)
Well controlled, will need to monitor BP and K+ with addition of spironolactone.

## 2012-06-25 NOTE — Assessment & Plan Note (Signed)
Discussed diabetic specific nutrition specifically today- she has stopped drinking sodas but still drinking sweet tea, she is going to work on this, as well as increasing exercise. She would like to see Dr Gerilyn Pilgrim for nutrition, gave her contact info.

## 2012-06-25 NOTE — Assessment & Plan Note (Addendum)
Will start spironolactone to see if this helps with facial hair.  Discussed risks and benefits- understands will need to monitor K+ on this medication.  D/C Eflornithine as she is not tolerating it. Also encouraged weight loss.

## 2012-06-25 NOTE — Assessment & Plan Note (Signed)
Reviewed recent lipid profile, discussed risks of MI and CVA and advised starting statin- she is agreeable to starting atorvastatin.

## 2012-06-25 NOTE — Patient Instructions (Signed)
It was good to see you.  Please call Dr. Gerilyn Pilgrim to schedule an appointment for nutrition.  To help control your blood sugars, please avoid and minimize foods with lots of carbohydrates and sugars.  Those foods include breads, pastas, potatoes, corn, sweets and deserts.  Try to increase the amount and variety of vegetables you eat, and include a veggie with each meal.    For your facial hair: I am starting you on spironolactone.  We will need to check your potassium at your next visit.   For your cholesterol, please start taking atorvastatin (generic Lipitor).  You last cholesterol was:  Lab Results  Component Value Date   CHOL 259* 05/25/2012   HDL 39* 05/25/2012   LDLCALC 204* 05/25/2012   LDLDIRECT 157* 07/29/2011   TRIG 81 05/25/2012   CHOLHDL 6.6 05/25/2012

## 2012-07-13 ENCOUNTER — Telehealth: Payer: Self-pay | Admitting: Family Medicine

## 2012-07-13 NOTE — Telephone Encounter (Signed)
Called the after hours line.  Pt complaining of intermittent muscle cramping since last appt w/ Dr. Lula Olszewski when she was started on spironolactone adn glipizide. Also concerned that CBG this am was 165.  Pt reassurred that CBG of 165 is OK. Instructed pt to stop taking spironolactone and recommended she come in today for an appointment and to have blood work drawn to check potassium levels. Pt agreeable and will call at 08:30  Shelly Flatten, MD Family Medicine PGY-2 07/13/2012, 6:59 AM

## 2012-07-24 ENCOUNTER — Ambulatory Visit (HOSPITAL_BASED_OUTPATIENT_CLINIC_OR_DEPARTMENT_OTHER): Payer: Medicare Other

## 2012-07-24 DIAGNOSIS — Z452 Encounter for adjustment and management of vascular access device: Secondary | ICD-10-CM

## 2012-07-24 DIAGNOSIS — C3410 Malignant neoplasm of upper lobe, unspecified bronchus or lung: Secondary | ICD-10-CM

## 2012-07-24 MED ORDER — HEPARIN SOD (PORK) LOCK FLUSH 100 UNIT/ML IV SOLN
500.0000 [IU] | Freq: Once | INTRAVENOUS | Status: AC
  Administered 2012-07-24: 500 [IU] via INTRAVENOUS
  Filled 2012-07-24: qty 5

## 2012-07-24 MED ORDER — SODIUM CHLORIDE 0.9 % IJ SOLN
10.0000 mL | INTRAMUSCULAR | Status: DC | PRN
  Administered 2012-07-24: 10 mL via INTRAVENOUS
  Filled 2012-07-24: qty 10

## 2012-07-24 NOTE — Patient Instructions (Addendum)

## 2012-07-27 ENCOUNTER — Ambulatory Visit: Payer: Medicare Other | Admitting: Family Medicine

## 2012-08-01 ENCOUNTER — Other Ambulatory Visit: Payer: Self-pay | Admitting: Family Medicine

## 2012-09-04 ENCOUNTER — Ambulatory Visit (HOSPITAL_BASED_OUTPATIENT_CLINIC_OR_DEPARTMENT_OTHER): Payer: Medicare Other

## 2012-09-04 VITALS — BP 166/96 | HR 71 | Temp 98.0°F

## 2012-09-04 DIAGNOSIS — C3490 Malignant neoplasm of unspecified part of unspecified bronchus or lung: Secondary | ICD-10-CM

## 2012-09-04 DIAGNOSIS — C3410 Malignant neoplasm of upper lobe, unspecified bronchus or lung: Secondary | ICD-10-CM

## 2012-09-04 DIAGNOSIS — Z452 Encounter for adjustment and management of vascular access device: Secondary | ICD-10-CM

## 2012-09-04 MED ORDER — SODIUM CHLORIDE 0.9 % IJ SOLN
10.0000 mL | INTRAMUSCULAR | Status: DC | PRN
  Administered 2012-09-04: 10 mL via INTRAVENOUS
  Filled 2012-09-04: qty 10

## 2012-09-04 MED ORDER — HEPARIN SOD (PORK) LOCK FLUSH 100 UNIT/ML IV SOLN
500.0000 [IU] | Freq: Once | INTRAVENOUS | Status: AC
  Administered 2012-09-04: 500 [IU] via INTRAVENOUS
  Filled 2012-09-04: qty 5

## 2012-09-04 NOTE — Patient Instructions (Addendum)
Call MD for problems 

## 2012-09-20 ENCOUNTER — Other Ambulatory Visit: Payer: Self-pay

## 2012-09-24 ENCOUNTER — Other Ambulatory Visit: Payer: Self-pay | Admitting: *Deleted

## 2012-09-24 DIAGNOSIS — E119 Type 2 diabetes mellitus without complications: Secondary | ICD-10-CM

## 2012-09-25 ENCOUNTER — Other Ambulatory Visit: Payer: Self-pay | Admitting: Family Medicine

## 2012-09-25 DIAGNOSIS — M549 Dorsalgia, unspecified: Secondary | ICD-10-CM

## 2012-09-25 DIAGNOSIS — E119 Type 2 diabetes mellitus without complications: Secondary | ICD-10-CM

## 2012-09-25 MED ORDER — VALSARTAN-HYDROCHLOROTHIAZIDE 160-12.5 MG PO TABS: 1.0000 | ORAL_TABLET | Freq: Every morning | ORAL | Status: DC

## 2012-09-25 MED ORDER — PROPRANOLOL HCL ER 80 MG PO CP24: ORAL_CAPSULE | ORAL | Status: DC

## 2012-09-25 MED ORDER — OMEPRAZOLE 40 MG PO CPDR: 40.0000 mg | DELAYED_RELEASE_CAPSULE | Freq: Every day | ORAL | Status: DC

## 2012-09-25 MED ORDER — CARVEDILOL 25 MG PO TABS: 25.0000 mg | ORAL_TABLET | Freq: Two times a day (BID) | ORAL | Status: DC

## 2012-09-25 MED ORDER — ATORVASTATIN CALCIUM 40 MG PO TABS: 40.0000 mg | ORAL_TABLET | Freq: Every day | ORAL | Status: DC

## 2012-09-25 MED ORDER — AMLODIPINE BESYLATE 10 MG PO TABS: 10.0000 mg | ORAL_TABLET | Freq: Every morning | ORAL | Status: DC

## 2012-09-25 MED ORDER — GLIPIZIDE 5 MG PO TABS: 5.0000 mg | ORAL_TABLET | Freq: Two times a day (BID) | ORAL | Status: DC

## 2012-09-25 MED ORDER — GABAPENTIN 300 MG PO CAPS: 300.0000 mg | ORAL_CAPSULE | Freq: Three times a day (TID) | ORAL | Status: DC

## 2012-09-25 MED ORDER — SPIRONOLACTONE 50 MG PO TABS: 50.0000 mg | ORAL_TABLET | Freq: Every day | ORAL | Status: DC

## 2012-10-25 ENCOUNTER — Ambulatory Visit (INDEPENDENT_AMBULATORY_CARE_PROVIDER_SITE_OTHER): Payer: Medicare Other | Admitting: Family Medicine

## 2012-10-25 ENCOUNTER — Encounter: Payer: Self-pay | Admitting: Family Medicine

## 2012-10-25 VITALS — BP 138/86 | HR 83 | Temp 98.6°F | Wt 226.0 lb

## 2012-10-25 DIAGNOSIS — R0789 Other chest pain: Secondary | ICD-10-CM

## 2012-10-25 DIAGNOSIS — F17200 Nicotine dependence, unspecified, uncomplicated: Secondary | ICD-10-CM

## 2012-10-25 DIAGNOSIS — M79606 Pain in leg, unspecified: Secondary | ICD-10-CM | POA: Insufficient documentation

## 2012-10-25 DIAGNOSIS — M79609 Pain in unspecified limb: Secondary | ICD-10-CM

## 2012-10-25 DIAGNOSIS — I10 Essential (primary) hypertension: Secondary | ICD-10-CM

## 2012-10-25 DIAGNOSIS — E119 Type 2 diabetes mellitus without complications: Secondary | ICD-10-CM

## 2012-10-25 LAB — COMPREHENSIVE METABOLIC PANEL
ALT: 17 U/L (ref 0–35)
AST: 10 U/L (ref 0–37)
Albumin: 4.5 g/dL (ref 3.5–5.2)
Alkaline Phosphatase: 62 U/L (ref 39–117)
BUN: 11 mg/dL (ref 6–23)
CO2: 27 mEq/L (ref 19–32)
Calcium: 9.7 mg/dL (ref 8.4–10.5)
Chloride: 98 mEq/L (ref 96–112)
Creat: 1.41 mg/dL — ABNORMAL HIGH (ref 0.50–1.10)
Glucose, Bld: 173 mg/dL — ABNORMAL HIGH (ref 70–99)
Potassium: 3.3 mEq/L — ABNORMAL LOW (ref 3.5–5.3)
Sodium: 137 mEq/L (ref 135–145)
Total Bilirubin: 1.2 mg/dL (ref 0.3–1.2)
Total Protein: 7.3 g/dL (ref 6.0–8.3)

## 2012-10-25 LAB — IRON AND TIBC
%SAT: 22 % (ref 20–55)
Iron: 70 ug/dL (ref 42–145)
TIBC: 312 ug/dL (ref 250–470)
UIBC: 242 ug/dL (ref 125–400)

## 2012-10-25 LAB — VITAMIN B12: Vitamin B-12: 309 pg/mL (ref 211–911)

## 2012-10-25 LAB — CBC
HCT: 49.1 % — ABNORMAL HIGH (ref 36.0–46.0)
Hemoglobin: 17.1 g/dL — ABNORMAL HIGH (ref 12.0–15.0)
MCH: 30.8 pg (ref 26.0–34.0)
MCHC: 34.8 g/dL (ref 30.0–36.0)
MCV: 88.3 fL (ref 78.0–100.0)
Platelets: 246 10*3/uL (ref 150–400)
RBC: 5.56 MIL/uL — ABNORMAL HIGH (ref 3.87–5.11)
RDW: 14.6 % (ref 11.5–15.5)
WBC: 9.7 10*3/uL (ref 4.0–10.5)

## 2012-10-25 LAB — POCT GLYCOSYLATED HEMOGLOBIN (HGB A1C): Hemoglobin A1C: 7.6

## 2012-10-25 LAB — FOLATE: Folate: 8.5 ng/mL

## 2012-10-25 NOTE — Assessment & Plan Note (Addendum)
mutliple causative agents/etiologies including sciatica, chronic back problems w/ recent surgery and injections, DM neuropathy, chantix, lipitor, Vit B12/folate or iron deficiency anemias - cbc - folate, - b12 - iron panel  -------------------------ADDENDUM-------------------------------- No evidence for anemia, folate or B12 or iron deficiency Likely secondary to orthopedic low back procedure/pathology vs DM neuropathy vs sciatica (though unlikely as bilat) Cont stretching and exercises Cont f/u w/ Ortho Cont neurontin

## 2012-10-25 NOTE — Progress Notes (Signed)
Theresa [name] is a 47 y.o. female who presents to [facility] today for appt for leg and foot pain   Leg and foot pain: started 2 wks ago. H/o orthopedic surgery on back 2 mo ago. Had part of vertebrae removed that were fragmented from a previous accident. Receiving spina blocks from ortho 2 wks ago. Lower back pain improved. Achy pain. Elevates feet at home w/o benefit. Constant adn worse at night. Feels like needs to move legs at night. Hurts to walk. Denies any loss of bowel or bladder fxn, muscle weakness. Numb adn tingling toes. Pain runs down posterior to lateral thighs and to lat/ant lower legs bilat.  Lipid: started lipitor a couple of months ago. Has also had this previously w/o side effected.   Chest tightness and SOB while taking showers or walking about 60 feet. Improves w/ rest. Some relief w/ albuterol  Tobacoc: 2 cig a day. Chantix started beginning of month. Previously smoking 2ppd.   DM: cbg typically in the mid to high 100s. Vision change when glucose elevated. Only taking glipizide. No previous h/o Metformin use.   The following portions of the patient's history were reviewed and updated as appropriate: allergies, current medications, past medical history, family and social history, and problem list.  Patient is a smoker  Past Medical History  Diagnosis Date  . Hypertension   . Hyperlipidemia   . Blindness, legal     glaucoma and retinitis  pigmentosa  . Glaucoma   . Onychomycosis   . Retinitis pigmentosa   . Chronic eczema   . Diabetes mellitus type II     diet controlled  . Fibromyalgia   . Anxiety   . Lung cancer     lung ca dx 2010  . Hx of radiation therapy 01-17-11 to 03-18-11    lung  . History of chemotherapy 01/2011 to 03/2011    concurrent w/radiation therapy  . Chronic headaches 10-17-11    migraines    ROS as above otherwise neg.    Medications reviewed. Current Outpatient Prescriptions  Medication Sig Dispense Refill  . albuterol (PROVENTIL HFA;VENTOLIN  HFA) 108 (90 BASE) MCG/ACT inhaler Inhale 2 puffs into the lungs every 4 (four) hours as needed. For wheezing      . amLODipine (NORVASC) 10 MG tablet Take 1 tablet (10 mg total) by mouth every morning.  90 tablet  3  . atorvastatin (LIPITOR) 40 MG tablet Take 1 tablet (40 mg total) by mouth daily.  90 tablet  3  . Blood Glucose Monitoring Suppl (ONE TOUCH ULTRA 2) W/DEVICE KIT 1 each by Does not apply route every morning.  1 each  0  . carvedilol (COREG) 25 MG tablet Take 1 tablet (25 mg total) by mouth 2 (two) times daily with a meal.  180 tablet  3  . fluticasone (FLONASE) 50 MCG/ACT nasal spray Place 2 sprays into the nose daily.  16 g  6  . gabapentin (NEURONTIN) 300 MG capsule Take 1 capsule (300 mg total) by mouth 3 (three) times daily.  270 capsule  3  . glipiZIDE (GLUCOTROL) 5 MG tablet Take 1 tablet (5 mg total) by mouth 2 (two) times daily before a meal.  180 tablet  3  . glucose blood (ONE TOUCH ULTRA TEST) test strip Check fasting blood sugar once daily q AM  100 each  12  . Lancets (ONETOUCH ULTRASOFT) lancets Use as instructed  100 each  12  . omeprazole (PRILOSEC) 40 MG capsule Take 1  capsule (40 mg total) by mouth daily.  30 capsule  11  . PROAIR HFA 108 (90 BASE) MCG/ACT inhaler INHALE TWO PUFFS BY MOUTH EVERY FOUR HOURS AS NEEDED  17 each  2  . propranolol ER (INDERAL LA) 80 MG 24 hr capsule TAKE 1 CAPSULE DAILY  30 capsule  11  . spironolactone (ALDACTONE) 50 MG tablet Take 1 tablet (50 mg total) by mouth daily.  30 tablet  3  . TRIAMCINOLONE ACETONIDE, TOP, 0.05 % OINT Apply 1 application topically 2 (two) times daily.  17 g  0  . valsartan-hydrochlorothiazide (DIOVAN-HCT) 160-12.5 MG per tablet Take 1 tablet by mouth every morning.  90 tablet  3  . varenicline (CHANTIX) 0.5 MG tablet Take 1 tablet (0.5 mg total) by mouth 2 (two) times daily.  30 tablet  1  . venlafaxine XR (EFFEXOR XR) 150 MG 24 hr capsule Take 1 capsule (150 mg total) by mouth daily.  30 capsule  1   No  current facility-administered medications for this visit.    Exam: BP 138/86  Pulse 83  Temp(Src) 98.6 F (37 C) (Oral)  Wt 226 lb (102.513 kg)  BMI 41.33 kg/m2 Gen: Well NAD HEENT: EOMI,  MMM Lungs: CTABL Nl WOB Heart: RRR no MRG Abd: NABS, NT, ND Exts: Non edematous BL  LE, warm and well perfused.  MSK: FROM, ambulation w/o difficulty. nonttp LE  Results for orders placed in visit on 10/25/12 (from the past 72 hour(s))  POCT GLYCOSYLATED HEMOGLOBIN (HGB A1C)     Status: None   Collection Time    10/25/12  8:37 AM      Result Value Range   Hemoglobin A1C 7.6

## 2012-10-25 NOTE — Patient Instructions (Addendum)
Please stop taking the chantix. Please continue to try stopping smoking Please start doing the exercises outlined below for your leg pain Please take tylenol for the pain Please come back to see me in 2 weeks   Sciatica with Rehab The sciatic nerve runs from the back down the leg and is responsible for sensation and control of the muscles in the back (posterior) side of the thigh, lower leg, and foot. Sciatica is a condition that is characterized by inflammation of this nerve.  SYMPTOMS   Signs of nerve damage, including numbness and/or weakness along the posterior side of the lower extremity.  Pain in the back of the thigh that may also travel down the leg.  Pain that worsens when sitting for long periods of time.  Occasionally, pain in the back or buttock. CAUSES  Inflammation of the sciatic nerve is the cause of sciatica. The inflammation is due to something irritating the nerve. Common sources of irritation include:  Sitting for long periods of time.  Direct trauma to the nerve.  Arthritis of the spine.  Herniated or ruptured disk.  Slipping of the vertebrae (spondylolithesis)  Pressure from soft tissues, such as muscles or ligament-like tissue (fascia). RISK INCREASES WITH:  Sports that place pressure or stress on the spine (football or weightlifting).  Poor strength and flexibility.  Failure to warm-up properly before activity.  Family history of low back pain or disk disorders.  Previous back injury or surgery.  Poor body mechanics, especially when lifting, or poor posture. PREVENTION   Warm up and stretch properly before activity.  Maintain physical fitness:  Strength, flexibility, and endurance.  Cardiovascular fitness.  Learn and use proper technique, especially with posture and lifting. When possible, have coach correct improper technique.  Avoid activities that place stress on the spine. PROGNOSIS If treated properly, then sciatica usually  resolves within 6 weeks. However, occasionally surgery is necessary.  RELATED COMPLICATIONS   Permanent nerve damage, including pain, numbness, tingle, or weakness.  Chronic back pain.  Risks of surgery: infection, bleeding, nerve damage, or damage to surrounding tissues. TREATMENT Treatment initially involves resting from any activities that aggravate your symptoms. The use of ice and medication may help reduce pain and inflammation. The use of strengthening and stretching exercises may help reduce pain with activity. These exercises may be performed at home or with referral to a therapist. A therapist may recommend further treatments, such as transcutaneous electronic nerve stimulation (TENS) or ultrasound. Your caregiver may recommend corticosteroid injections to help reduce inflammation of the sciatic nerve. If symptoms persist despite non-surgical (conservative) treatment, then surgery may be recommended. MEDICATION  If pain medication is necessary, then nonsteroidal anti-inflammatory medications, such as aspirin and ibuprofen, or other minor pain relievers, such as acetaminophen, are often recommended.  Do not take pain medication for 7 days before surgery.  Prescription pain relievers may be given if deemed necessary by your caregiver. Use only as directed and only as much as you need.  Ointments applied to the skin may be helpful.  Corticosteroid injections may be given by your caregiver. These injections should be reserved for the most serious cases, because they may only be given a certain number of times. HEAT AND COLD  Cold treatment (icing) relieves pain and reduces inflammation. Cold treatment should be applied for 10 to 15 minutes every 2 to 3 hours for inflammation and pain and immediately after any activity that aggravates your symptoms. Use ice packs or massage the area with a piece  of ice (ice massage).  Heat treatment may be used prior to performing the stretching and  strengthening activities prescribed by your caregiver, physical therapist, or athletic trainer. Use a heat pack or soak the injury in warm water. SEEK MEDICAL CARE IF:  Treatment seems to offer no benefit, or the condition worsens.  Any medications produce adverse side effects. EXERCISES  RANGE OF MOTION (ROM) AND STRETCHING EXERCISES - Sciatica Most people with sciatic will find that their symptoms worsen with either excessive bending forward (flexion) or arching at the low back (extension). The exercises which will help resolve your symptoms will focus on the opposite motion. Your physician, physical therapist or athletic trainer will help you determine which exercises will be most helpful to resolve your low back pain. Do not complete any exercises without first consulting with your clinician. Discontinue any exercises which worsen your symptoms until you speak to your clinician. If you have pain, numbness or tingling which travels down into your buttocks, leg or foot, the goal of the therapy is for these symptoms to move closer to your back and eventually resolve. Occasionally, these leg symptoms will get better, but your low back pain may worsen; this is typically an indication of progress in your rehabilitation. Be certain to be very alert to any changes in your symptoms and the activities in which you participated in the 24 hours prior to the change. Sharing this information with your clinician will allow him/her to most efficiently treat your condition. These exercises may help you when beginning to rehabilitate your injury. Your symptoms may resolve with or without further involvement from your physician, physical therapist or athletic trainer. While completing these exercises, remember:   Restoring tissue flexibility helps normal motion to return to the joints. This allows healthier, less painful movement and activity.  An effective stretch should be held for at least 30 seconds.  A stretch  should never be painful. You should only feel a gentle lengthening or release in the stretched tissue. FLEXION RANGE OF MOTION AND STRETCHING EXERCISES: STRETCH  Flexion, Single Knee to Chest   Lie on a firm bed or floor with both legs extended in front of you.  Keeping one leg in contact with the floor, bring your opposite knee to your chest. Hold your leg in place by either grabbing behind your thigh or at your knee.  Pull until you feel a gentle stretch in your low back. Hold __________ seconds.  Slowly release your grasp and repeat the exercise with the opposite side. Repeat __________ times. Complete this exercise __________ times per day.  STRETCH  Flexion, Double Knee to Chest  Lie on a firm bed or floor with both legs extended in front of you.  Keeping one leg in contact with the floor, bring your opposite knee to your chest.  Tense your stomach muscles to support your back and then lift your other knee to your chest. Hold your legs in place by either grabbing behind your thighs or at your knees.  Pull both knees toward your chest until you feel a gentle stretch in your low back. Hold __________ seconds.  Tense your stomach muscles and slowly return one leg at a time to the floor. Repeat __________ times. Complete this exercise __________ times per day.  STRETCH  Low Trunk Rotation   Lie on a firm bed or floor. Keeping your legs in front of you, bend your knees so they are both pointed toward the ceiling and your feet are flat  on the floor.  Extend your arms out to the side. This will stabilize your upper body by keeping your shoulders in contact with the floor.  Gently and slowly drop both knees together to one side until you feel a gentle stretch in your low back. Hold for __________ seconds.  Tense your stomach muscles to support your low back as you bring your knees back to the starting position. Repeat the exercise to the other side. Repeat __________ times. Complete  this exercise __________ times per day  EXTENSION RANGE OF MOTION AND FLEXIBILITY EXERCISES: STRETCH  Extension, Prone on Elbows  Lie on your stomach on the floor, a bed will be too soft. Place your palms about shoulder width apart and at the height of your head.  Place your elbows under your shoulders. If this is too painful, stack pillows under your chest.  Allow your body to relax so that your hips drop lower and make contact more completely with the floor.  Hold this position for __________ seconds.  Slowly return to lying flat on the floor. Repeat __________ times. Complete this exercise __________ times per day.  RANGE OF MOTION  Extension, Prone Press Ups  Lie on your stomach on the floor, a bed will be too soft. Place your palms about shoulder width apart and at the height of your head.  Keeping your back as relaxed as possible, slowly straighten your elbows while keeping your hips on the floor. You may adjust the placement of your hands to maximize your comfort. As you gain motion, your hands will come more underneath your shoulders.  Hold this position __________ seconds.  Slowly return to lying flat on the floor. Repeat __________ times. Complete this exercise __________ times per day.  STRENGTHENING EXERCISES - Sciatica  These exercises may help you when beginning to rehabilitate your injury. These exercises should be done near your "sweet spot." This is the neutral, low-back arch, somewhere between fully rounded and fully arched, that is your least painful position. When performed in this safe range of motion, these exercises can be used for people who have either a flexion or extension based injury. These exercises may resolve your symptoms with or without further involvement from your physician, physical therapist or athletic trainer. While completing these exercises, remember:   Muscles can gain both the endurance and the strength needed for everyday activities through  controlled exercises.  Complete these exercises as instructed by your physician, physical therapist or athletic trainer. Progress with the resistance and repetition exercises only as your caregiver advises.  You may experience muscle soreness or fatigue, but the pain or discomfort you are trying to eliminate should never worsen during these exercises. If this pain does worsen, stop and make certain you are following the directions exactly. If the pain is still present after adjustments, discontinue the exercise until you can discuss the trouble with your clinician. STRENGTHENING Deep Abdominals, Pelvic Tilt   Lie on a firm bed or floor. Keeping your legs in front of you, bend your knees so they are both pointed toward the ceiling and your feet are flat on the floor.  Tense your lower abdominal muscles to press your low back into the floor. This motion will rotate your pelvis so that your tail bone is scooping upwards rather than pointing at your feet or into the floor.  With a gentle tension and even breathing, hold this position for __________ seconds. Repeat __________ times. Complete this exercise __________ times per day.  STRENGTHENING  Abdominals, Crunches   Lie on a firm bed or floor. Keeping your legs in front of you, bend your knees so they are both pointed toward the ceiling and your feet are flat on the floor. Cross your arms over your chest.  Slightly tip your chin down without bending your neck.  Tense your abdominals and slowly lift your trunk high enough to just clear your shoulder blades. Lifting higher can put excessive stress on the low back and does not further strengthen your abdominal muscles.  Control your return to the starting position. Repeat __________ times. Complete this exercise __________ times per day.  STRENGTHENING  Quadruped, Opposite UE/LE Lift  Assume a hands and knees position on a firm surface. Keep your hands under your shoulders and your knees under  your hips. You may place padding under your knees for comfort.  Find your neutral spine and gently tense your abdominal muscles so that you can maintain this position. Your shoulders and hips should form a rectangle that is parallel with the floor and is not twisted.  Keeping your trunk steady, lift your right hand no higher than your shoulder and then your left leg no higher than your hip. Make sure you are not holding your breath. Hold this position __________ seconds.  Continuing to keep your abdominal muscles tense and your back steady, slowly return to your starting position. Repeat with the opposite arm and leg. Repeat __________ times. Complete this exercise __________ times per day.  STRENGTHENING  Abdominals and Quadriceps, Straight Leg Raise   Lie on a firm bed or floor with both legs extended in front of you.  Keeping one leg in contact with the floor, bend the other knee so that your foot can rest flat on the floor.  Find your neutral spine, and tense your abdominal muscles to maintain your spinal position throughout the exercise.  Slowly lift your straight leg off the floor about 6 inches for a count of 15, making sure to not hold your breath.  Still keeping your neutral spine, slowly lower your leg all the way to the floor. Repeat this exercise with each leg __________ times. Complete this exercise __________ times per day. POSTURE AND BODY MECHANICS CONSIDERATIONS - Sciatica Keeping correct posture when sitting, standing or completing your activities will reduce the stress put on different body tissues, allowing injured tissues a chance to heal and limiting painful experiences. The following are general guidelines for improved posture. Your physician or physical therapist will provide you with any instructions specific to your needs. While reading these guidelines, remember:  The exercises prescribed by your provider will help you have the flexibility and strength to maintain  correct postures.  The correct posture provides the optimal environment for your joints to work. All of your joints have less wear and tear when properly supported by a spine with good posture. This means you will experience a healthier, less painful body.  Correct posture must be practiced with all of your activities, especially prolonged sitting and standing. Correct posture is as important when doing repetitive low-stress activities (typing) as it is when doing a single heavy-load activity (lifting). RESTING POSITIONS Consider which positions are most painful for you when choosing a resting position. If you have pain with flexion-based activities (sitting, bending, stooping, squatting), choose a position that allows you to rest in a less flexed posture. You would want to avoid curling into a fetal position on your side. If your pain worsens with extension-based activities (prolonged standing,  working overhead), avoid resting in an extended position such as sleeping on your stomach. Most people will find more comfort when they rest with their spine in a more neutral position, neither too rounded nor too arched. Lying on a non-sagging bed on your side with a pillow between your knees, or on your back with a pillow under your knees will often provide some relief. Keep in mind, being in any one position for a prolonged period of time, no matter how correct your posture, can still lead to stiffness. PROPER SITTING POSTURE In order to minimize stress and discomfort on your spine, you must sit with correct posture Sitting with good posture should be effortless for a healthy body. Returning to good posture is a gradual process. Many people can work toward this most comfortably by using various supports until they have the flexibility and strength to maintain this posture on their own. When sitting with proper posture, your ears will fall over your shoulders and your shoulders will fall over your hips. You should  use the back of the chair to support your upper back. Your low back will be in a neutral position, just slightly arched. You may place a small pillow or folded towel at the base of your low back for support.  When working at a desk, create an environment that supports good, upright posture. Without extra support, muscles fatigue and lead to excessive strain on joints and other tissues. Keep these recommendations in mind: CHAIR:   A chair should be able to slide under your desk when your back makes contact with the back of the chair. This allows you to work closely.  The chair's height should allow your eyes to be level with the upper part of your monitor and your hands to be slightly lower than your elbows. BODY POSITION  Your feet should make contact with the floor. If this is not possible, use a foot rest.  Keep your ears over your shoulders. This will reduce stress on your neck and low back. INCORRECT SITTING POSTURES   If you are feeling tired and unable to assume a healthy sitting posture, do not slouch or slump. This puts excessive strain on your back tissues, causing more damage and pain. Healthier options include:  Using more support, like a lumbar pillow.  Switching tasks to something that requires you to be upright or walking.  Talking a brief walk.  Lying down to rest in a neutral-spine position. PROLONGED STANDING WHILE SLIGHTLY LEANING FORWARD  When completing a task that requires you to lean forward while standing in one place for a long time, place either foot up on a stationary 2-4 inch high object to help maintain the best posture. When both feet are on the ground, the low back tends to lose its slight inward curve. If this curve flattens (or becomes too large), then the back and your other joints will experience too much stress, fatigue more quickly and can cause pain.  CORRECT STANDING POSTURES Proper standing posture should be assumed with all daily activities, even if  they only take a few moments, like when brushing your teeth. As in sitting, your ears should fall over your shoulders and your shoulders should fall over your hips. You should keep a slight tension in your abdominal muscles to brace your spine. Your tailbone should point down to the ground, not behind your body, resulting in an over-extended swayback posture.  INCORRECT STANDING POSTURES  Common incorrect standing postures include a forward head,  locked knees and/or an excessive swayback. WALKING Walk with an upright posture. Your ears, shoulders and hips should all line-up. PROLONGED ACTIVITY IN A FLEXED POSITION When completing a task that requires you to bend forward at your waist or lean over a low surface, try to find a way to stabilize 3 of 4 of your limbs. You can place a hand or elbow on your thigh or rest a knee on the surface you are reaching across. This will provide you more stability so that your muscles do not fatigue as quickly. By keeping your knees relaxed, or slightly bent, you will also reduce stress across your low back. CORRECT LIFTING TECHNIQUES DO :   Assume a wide stance. This will provide you more stability and the opportunity to get as close as possible to the object which you are lifting.  Tense your abdominals to brace your spine; then bend at the knees and hips. Keeping your back locked in a neutral-spine position, lift using your leg muscles. Lift with your legs, keeping your back straight.  Test the weight of unknown objects before attempting to lift them.  Try to keep your elbows locked down at your sides in order get the best strength from your shoulders when carrying an object.  Always ask for help when lifting heavy or awkward objects. INCORRECT LIFTING TECHNIQUES DO NOT:   Lock your knees when lifting, even if it is a small object.  Bend and twist. Pivot at your feet or move your feet when needing to change directions.  Assume that you cannot safely pick  up a paperclip without proper posture. Document Released: 02/28/2005 Document Revised: 05/23/2011 Document Reviewed: 06/12/2008 [facility] Patient Information 2014 [place], [place].

## 2012-10-26 ENCOUNTER — Telehealth: Payer: Self-pay | Admitting: Family Medicine

## 2012-10-26 DIAGNOSIS — R0789 Other chest pain: Secondary | ICD-10-CM | POA: Insufficient documentation

## 2012-10-26 NOTE — Assessment & Plan Note (Signed)
Continue glipizide Diet exercise Unable to start metformin due to Cr elevation (>1.4) May need to start injectable if > 7 at next visit.

## 2012-10-26 NOTE — Telephone Encounter (Signed)
Unable to start metformin secondary to elevated Cr.  Will send in Cards stress test orders. Pt to wait to be called. No other changes

## 2012-10-26 NOTE — Assessment & Plan Note (Signed)
Significant improvement w/ CHantix but possible cardiac symptoms DC chantix as this may be exacerbating cardiac symptoms [place] quitline info provided

## 2012-10-26 NOTE — Telephone Encounter (Signed)
Theresa [name] called regarding the rx for Meftormin that you were suppose to send to her pharmacy [facility] on [address]  Also were to give her information regarding a stress test you wanted her to take.  Please contact her asap to advise.

## 2012-10-26 NOTE — Assessment & Plan Note (Signed)
Would favor cardiac eval and possible stress test due to symptoms and risk factors. No ss of acute cardiac injury

## 2012-10-26 NOTE — Assessment & Plan Note (Signed)
Cont current therapy

## 2012-10-26 NOTE — Telephone Encounter (Signed)
Will forward to MD to advise.  I don't see were there was metformin called in or a order placed for stress/cardiology.  Brittne Kawasaki,CMA

## 2012-10-29 ENCOUNTER — Other Ambulatory Visit (HOSPITAL_BASED_OUTPATIENT_CLINIC_OR_DEPARTMENT_OTHER): Payer: Medicare Other | Admitting: Lab

## 2012-10-29 ENCOUNTER — Ambulatory Visit (HOSPITAL_COMMUNITY)
Admission: RE | Admit: 2012-10-29 | Discharge: 2012-10-29 | Disposition: A | Discharge status: Home / Self Care | Payer: Medicare Other | Source: Ambulatory Visit | Attending: Internal Medicine | Admitting: Internal Medicine

## 2012-10-29 ENCOUNTER — Ambulatory Visit: Payer: Medicare Other

## 2012-10-29 DIAGNOSIS — C3490 Malignant neoplasm of unspecified part of unspecified bronchus or lung: Secondary | ICD-10-CM | POA: Insufficient documentation

## 2012-10-29 LAB — COMPREHENSIVE METABOLIC PANEL (CC13)
ALT: 15 U/L (ref 0–55)
AST: 11 U/L (ref 5–34)
Albumin: 3.7 g/dL (ref 3.5–5.0)
Alkaline Phosphatase: 52 U/L (ref 40–150)
BUN: 11.6 mg/dL (ref 7.0–26.0)
CO2: 27 mEq/L (ref 22–29)
Calcium: 9.1 mg/dL (ref 8.4–10.4)
Chloride: 102 mEq/L (ref 98–109)
Creatinine: 1.5 mg/dL — ABNORMAL HIGH (ref 0.6–1.1)
Glucose: 183 mg/dl — ABNORMAL HIGH (ref 70–140)
Potassium: 3.1 mEq/L — ABNORMAL LOW (ref 3.5–5.1)
Sodium: 140 mEq/L (ref 136–145)
Total Bilirubin: 0.99 mg/dL (ref 0.20–1.20)
Total Protein: 6.8 g/dL (ref 6.4–8.3)

## 2012-10-29 LAB — CBC WITH DIFFERENTIAL/PLATELET
BASO%: 0.6 % (ref 0.0–2.0)
Basophils Absolute: 0 10*3/uL (ref 0.0–0.1)
EOS%: 3.1 % (ref 0.0–7.0)
Eosinophils Absolute: 0.2 10*3/uL (ref 0.0–0.5)
HCT: 47.4 % — ABNORMAL HIGH (ref 34.8–46.6)
HGB: 16.1 g/dL — ABNORMAL HIGH (ref 11.6–15.9)
LYMPH%: 20.5 % (ref 14.0–49.7)
MCH: 31 pg (ref 25.1–34.0)
MCHC: 34 g/dL (ref 31.5–36.0)
MCV: 91.3 fL (ref 79.5–101.0)
MONO#: 0.8 10*3/uL (ref 0.1–0.9)
MONO%: 10.7 % (ref 0.0–14.0)
NEUT#: 5.1 10*3/uL (ref 1.5–6.5)
NEUT%: 65.1 % (ref 38.4–76.8)
Platelets: 197 10*3/uL (ref 145–400)
RBC: 5.18 10*6/uL (ref 3.70–5.45)
RDW: 14 % (ref 11.2–14.5)
WBC: 7.8 10*3/uL (ref 3.9–10.3)
lymph#: 1.6 10*3/uL (ref 0.9–3.3)

## 2012-10-29 MED ORDER — IOHEXOL 300 MG/ML  SOLN
80.0000 mL | Freq: Once | INTRAMUSCULAR | Status: AC | PRN
  Administered 2012-10-29: 80 mL via INTRAVENOUS

## 2012-10-29 MED ORDER — SODIUM CHLORIDE 0.9 % IJ SOLN
10.0000 mL | INTRAMUSCULAR | Status: DC | PRN
Start: 2012-10-29 — End: 2012-10-29
  Filled 2012-10-29: qty 10

## 2012-10-29 NOTE — Progress Notes (Signed)
Quick Note:  Call patient with the result and order K Dur 20 meq po qd X 7 days ______ 

## 2012-10-29 NOTE — Patient Instructions (Addendum)
Implanted Port Instructions  An implanted port is a central line that has a round shape and is placed under the skin. It is used for long-term IV (intravenous) access for:  · Medicine.  · Fluids.  · Liquid nutrition, such as TPN (total parenteral nutrition).  · Blood samples.  Ports can be placed:  · In the chest area just below the collarbone (this is the most common place.)  · In the arms.  · In the belly (abdomen) area.  · In the legs.  PARTS OF THE PORT  A port has 2 main parts:  · The reservoir. The reservoir is round, disc-shaped, and will be a small, raised area under your skin.  · The reservoir is the part where a needle is inserted (accessed) to either give medicines or to draw blood.  · The catheter. The catheter is a long, slender tube that extends from the reservoir. The catheter is placed into a large vein.  · Medicine that is inserted into the reservoir goes into the catheter and then into the vein.  INSERTION OF THE PORT  · The port is surgically placed in either an operating room or in a procedural area (interventional radiology).  · Medicine may be given to help you relax during the procedure.  · The skin where the port will be inserted is numbed (local anesthetic).  · 1 or 2 small cuts (incisions) will be made in the skin to insert the port.  · The port can be used after it has been inserted.  INCISION SITE CARE  · The incision site may have small adhesive strips on it. This helps keep the incision site closed. Sometimes, no adhesive strips are placed. Instead of adhesive strips, a special kind of surgical glue is used to keep the incision closed.  · If adhesive strips were placed on the incision sites, do not take them off. They will fall off on their own.  · The incision site may be sore for 1 to 2 days. Pain medicine can help.  · Do not get the incision site wet. Bathe or shower as directed by your caregiver.  · The incision site should heal in 5 to 7 days. A small scar may form after the  incision has healed.  ACCESSING THE PORT  Special steps must be taken to access the port:  · Before the port is accessed, a numbing cream can be placed on the skin. This helps numb the skin over the port site.  · A sterile technique is used to access the port.  · The port is accessed with a needle. Only "non-coring" port needles should be used to access the port. Once the port is accessed, a blood return should be checked. This helps ensure the port is in the vein and is not clogged (clotted).  · If your caregiver believes your port should remain accessed, a clear (transparent) bandage will be placed over the needle site. The bandage and needle will need to be changed every week or as directed by your caregiver.  · Keep the bandage covering the needle clean and dry. Do not get it wet. Follow your caregiver's instructions on how to take a shower or bath when the port is accessed.  · If your port does not need to stay accessed, no bandage is needed over the port.  FLUSHING THE PORT  Flushing the port keeps it from getting clogged. How often the port is flushed depends on:  · If a   constant infusion is running. If a constant infusion is running, the port may not need to be flushed.  · If intermittent medicines are given.  · If the port is not being used.  For intermittent medicines:  · The port will need to be flushed:  · After medicines have been given.  · After blood has been drawn.  · As part of routine maintenance.  · A port is normally flushed with:  · Normal saline.  · Heparin.  · Follow your caregiver's advice on how often, how much, and the type of flush to use on your port.  IMPORTANT PORT INFORMATION  · Tell your caregiver if you are allergic to heparin.  · After your port is placed, you will get a manufacturer's information card. The card has information about your port. Keep this card with you at all times.  · There are many types of ports available. Know what kind of port you have.  · In case of an  emergency, it may be helpful to wear a medical alert bracelet. This can help alert health care workers that you have a port.  · The port can stay in for as long as your caregiver believes it is necessary.  · When it is time for the port to come out, surgery will be done to remove it. The surgery will be similar to how the port was put in.  · If you are in the hospital or clinic:  · Your port will be taken care of and flushed by a nurse.  · If you are at home:  · A home health care nurse may give medicines and take care of the port.  · You or a family member can get special training and directions for giving medicine and taking care of the port at home.  SEEK IMMEDIATE MEDICAL CARE IF:   · Your port does not flush or you are unable to get a blood return.  · New drainage or pus is coming from the incision.  · A bad smell is coming from the incision site.  · You develop swelling or increased redness at the incision site.  · You develop increased swelling or pain at the port site.  · You develop swelling or pain in the surrounding skin near the port.  · You have an oral temperature above 102° F (38.9° C), not controlled by medicine.  MAKE SURE YOU:   · Understand these instructions.  · Will watch your condition.  · Will get help right away if you are not doing well or get worse.  Document Released: 02/28/2005 Document Revised: 05/23/2011 Document Reviewed: 05/22/2008  [employer]® Patient Information ©2014 [employer].

## 2012-10-30 ENCOUNTER — Telehealth: Payer: Self-pay | Admitting: *Deleted

## 2012-10-30 ENCOUNTER — Other Ambulatory Visit: Payer: Self-pay | Admitting: *Deleted

## 2012-10-30 DIAGNOSIS — E876 Hypokalemia: Secondary | ICD-10-CM

## 2012-10-30 MED ORDER — POTASSIUM CHLORIDE CRYS ER 20 MEQ PO TBCR: 20.0000 meq | EXTENDED_RELEASE_TABLET | ORAL | Status: DC

## 2012-10-30 NOTE — Telephone Encounter (Signed)
Pt is aware of results also appt has been placed for cardiology.  Theresa [name],CMA

## 2012-10-30 NOTE — Telephone Encounter (Signed)
Called and spoke to patient, she verbalized understanding.  SLJ

## 2012-10-30 NOTE — Telephone Encounter (Signed)
Message copied by Caren Griffins on Tue Oct 30, 2012  3:28 PM ------      Message from: Si Gaul      Created: Mon Oct 29, 2012  1:53 PM       Call patient with the result and order K Dur 20 meq po qd X 7 days. ------

## 2012-10-31 ENCOUNTER — Telehealth: Payer: Self-pay | Admitting: Internal Medicine

## 2012-10-31 ENCOUNTER — Encounter: Payer: Self-pay | Admitting: Internal Medicine

## 2012-10-31 ENCOUNTER — Ambulatory Visit (HOSPITAL_BASED_OUTPATIENT_CLINIC_OR_DEPARTMENT_OTHER): Payer: Medicare Other | Admitting: Internal Medicine

## 2012-10-31 VITALS — BP 105/69 | HR 77 | Temp 98.6°F | Resp 20 | Ht 62.0 in | Wt 228.1 lb

## 2012-10-31 DIAGNOSIS — C3490 Malignant neoplasm of unspecified part of unspecified bronchus or lung: Secondary | ICD-10-CM

## 2012-10-31 DIAGNOSIS — F17200 Nicotine dependence, unspecified, uncomplicated: Secondary | ICD-10-CM

## 2012-10-31 NOTE — Patient Instructions (Signed)
CURRENT THERAPY: Observation.  CHEMOTHERAPY INTENT: Curative  CURRENT # OF CHEMOTHERAPY CYCLES: 0  CURRENT ANTIEMETICS: None  CURRENT SMOKING STATUS: Current smoker and I strongly encouraged her to quit smoking and altered her to smoke cessation program.  ORAL CHEMOTHERAPY AND CONSENT: None  CURRENT BISPHOSPHONATES USE: None  PAIN MANAGEMENT: 0/10  NARCOTICS INDUCED CONSTIPATION: None  LIVING WILL AND CODE STATUS: No CODE BLUE

## 2012-10-31 NOTE — Telephone Encounter (Signed)
gv and printed appt sched and avs for pt  °

## 2012-10-31 NOTE — Progress Notes (Signed)
[facility] Telephone:([phone]   [phone]  OFFICE PROGRESS NOTE  MERRELL, DAVID, MD [address] [place] [address]  DIAGNOSIS: Recurrent non-small cell lung cancer initially diagnosed as stage IA (T1b, N0, MX) adenocarcinoma in November 2009.   PRIOR THERAPY:  #1 Status post left upper lobe trisegmentectomy with lymph node dissection under the care of Dr. Edwyna Shell on March 24, 2009.  #2 Concurrent chemoradiation with carboplatin for AUC of 2 and paclitaxel 45 mg/M2 given weekly with radiation.   CURRENT THERAPY: Observation.  CHEMOTHERAPY INTENT: Curative  CURRENT # OF CHEMOTHERAPY CYCLES: 0  CURRENT ANTIEMETICS: None  CURRENT SMOKING STATUS: Current smoker and I strongly encouraged her to quit smoking and altered her to smoke cessation program.  ORAL CHEMOTHERAPY AND CONSENT: None  CURRENT BISPHOSPHONATES USE: None  PAIN MANAGEMENT: 0/10  NARCOTICS INDUCED CONSTIPATION: None  LIVING WILL AND CODE STATUS: No CODE BLUE   INTERVAL HISTORY: Theresa [name] 47 y.o. female returns to the clinic today for six-month followup visit. The patient is feeling fine today with no specific complaints. She has no chest pain, shortness of breath, cough or hemoptysis. The patient denied having any fatigue or weakness. Unfortunately she continues to smoke and I strongly encouraged her to quit smoking. She had repeat CT scan of the chest performed recently and she is here for evaluation and discussion of her scan results.  MEDICAL HISTORY: Past Medical History  Diagnosis Date  . Hypertension   . Hyperlipidemia   . Blindness, legal     glaucoma and retinitis  pigmentosa  . Glaucoma   . Onychomycosis   . Retinitis pigmentosa   . Chronic eczema   . Diabetes mellitus type II     diet controlled  . Fibromyalgia   . Anxiety   . Lung cancer     lung ca dx 2010  . Hx of radiation therapy 01-17-11 to 03-18-11    lung  . History of chemotherapy  01/2011 to 03/2011    concurrent w/radiation therapy  . Chronic headaches 10-17-11    migraines    ALLERGIES:  is allergic to morphine and related; codeine; and penicillins.  MEDICATIONS:  Current Outpatient Prescriptions  Medication Sig Dispense Refill  . albuterol (PROVENTIL HFA;VENTOLIN HFA) 108 (90 BASE) MCG/ACT inhaler Inhale 2 puffs into the lungs every 4 (four) hours as needed. For wheezing      . amLODipine (NORVASC) 10 MG tablet Take 1 tablet (10 mg total) by mouth every morning.  90 tablet  3  . atorvastatin (LIPITOR) 40 MG tablet Take 1 tablet (40 mg total) by mouth daily.  90 tablet  3  . Blood Glucose Monitoring Suppl (ONE TOUCH ULTRA 2) W/DEVICE KIT 1 each by Does not apply route every morning.  1 each  0  . carvedilol (COREG) 25 MG tablet Take 1 tablet (25 mg total) by mouth 2 (two) times daily with a meal.  180 tablet  3  . fluticasone (FLONASE) 50 MCG/ACT nasal spray Place 2 sprays into the nose daily.  16 g  6  . gabapentin (NEURONTIN) 300 MG capsule Take 1 capsule (300 mg total) by mouth 3 (three) times daily.  270 capsule  3  . glipiZIDE (GLUCOTROL) 5 MG tablet Take 1 tablet (5 mg total) by mouth 2 (two) times daily before a meal.  180 tablet  3  . glucose blood (ONE TOUCH ULTRA TEST) test strip Check fasting blood sugar once daily q  AM  100 each  12  . Lancets (ONETOUCH ULTRASOFT) lancets Use as instructed  100 each  12  . omeprazole (PRILOSEC) 40 MG capsule Take 1 capsule (40 mg total) by mouth daily.  30 capsule  11  . potassium chloride SA (K-DUR,KLOR-CON) 20 MEQ tablet Take 1 tablet (20 mEq total) by mouth as directed. 1 tablet daily x 7 days  7 tablet  0  . PROAIR HFA 108 (90 BASE) MCG/ACT inhaler INHALE TWO PUFFS BY MOUTH EVERY FOUR HOURS AS NEEDED  17 each  2  . spironolactone (ALDACTONE) 50 MG tablet Take 1 tablet (50 mg total) by mouth daily.  30 tablet  3  . TRIAMCINOLONE ACETONIDE, TOP, 0.05 % OINT Apply 1 application topically 2 (two) times daily.  17 g  0  .  valsartan-hydrochlorothiazide (DIOVAN-HCT) 160-12.5 MG per tablet Take 1 tablet by mouth every morning.  90 tablet  3  . varenicline (CHANTIX) 0.5 MG tablet Take 1 tablet (0.5 mg total) by mouth 2 (two) times daily.  30 tablet  1  . venlafaxine XR (EFFEXOR XR) 150 MG 24 hr capsule Take 1 capsule (150 mg total) by mouth daily.  30 capsule  1   No current facility-administered medications for this visit.    SURGICAL HISTORY:  Past Surgical History  Procedure Laterality Date  . Lung surgery  03/24/08    L vats, L thoracotomy and LUL trisegmentectomy with node dissection  . Chest tube insertion   04/01/08    L hydropneumothorax  . Back surgery    . Eye surgery      lens implant  . Portacath placement  02/11/2011    Procedure: INSERTION PORT-A-CATH;  Surgeon: Norton Blizzard, MD;  Location: [facility] OR;  Service: Thoracic;  Laterality: Right;  9.6Fr. Pre-attached Power Port in Right Internal Jugular  -right chest-remains inplace 10-17-11  . Knee arthroscopy  10-17-11    bil. knee scope(left was torn ligament)  . Lumbar laminectomy/decompression microdiscectomy  10/27/2011    Procedure: LUMBAR LAMINECTOMY/DECOMPRESSION MICRODISCECTOMY;  Surgeon: Javier Docker, MD;  Location: [facility] ORS;  Service: Orthopedics;  Laterality: N/A;  L4-L5  . Abdominal hysterectomy  09/19/2001    hx. of fibroids. TAH. Ovaries remain.     REVIEW OF SYSTEMS:  A comprehensive review of systems was negative.   PHYSICAL EXAMINATION: General appearance: alert, cooperative and no distress Head: Normocephalic, without obvious abnormality, atraumatic Neck: no adenopathy Lymph nodes: Cervical, supraclavicular, and axillary nodes normal. Resp: clear to auscultation bilaterally Cardio: regular rate and rhythm, S1, S2 normal, no murmur, click, rub or gallop GI: soft, non-tender; bowel sounds normal; no masses,  no organomegaly Extremities: extremities normal, atraumatic, no cyanosis or edema  ECOG PERFORMANCE STATUS: 1 - Symptomatic  but completely ambulatory  Blood pressure 105/69, pulse 77, temperature 98.6 F (37 C), temperature source Oral, resp. rate 20, height 5\' 2"  (1.575 m), weight 228 lb 1.6 oz (103.465 kg).  LABORATORY DATA: Lab Results  Component Value Date   WBC 7.8 10/29/2012   HGB 16.1* 10/29/2012   HCT 47.4* 10/29/2012   MCV 91.3 10/29/2012   PLT 197 10/29/2012      Chemistry      Component Value Date/Time   NA 140 10/29/2012 0926   NA 137 10/25/2012 0953   NA 141 07/14/2011 0934   K 3.1* 10/29/2012 0926   K 3.3* 10/25/2012 0953   K 3.4 07/14/2011 0934   CL 98 10/25/2012 0953   CL 102 01/18/2012 0805  CL 100 07/14/2011 0934   CO2 27 10/29/2012 0926   CO2 27 10/25/2012 0953   CO2 30 07/14/2011 0934   BUN 11.6 10/29/2012 0926   BUN 11 10/25/2012 0953   BUN 13 07/14/2011 0934   CREATININE 1.5* 10/29/2012 0926   CREATININE 1.41* 10/25/2012 0953   CREATININE 1.54* 10/28/2011 0429      Component Value Date/Time   CALCIUM 9.1 10/29/2012 0926   CALCIUM 9.7 10/25/2012 0953   CALCIUM 8.7 07/14/2011 0934   ALKPHOS 52 10/29/2012 0926   ALKPHOS 62 10/25/2012 0953   ALKPHOS 38 07/14/2011 0934   AST 11 10/29/2012 0926   AST 10 10/25/2012 0953   AST 19 07/14/2011 0934   ALT 15 10/29/2012 0926   ALT 17 10/25/2012 0953   ALT 20 07/14/2011 0934   BILITOT 0.99 10/29/2012 0926   BILITOT 1.2 10/25/2012 0953   BILITOT 1.20 07/14/2011 0934       RADIOGRAPHIC STUDIES: Ct Chest W Contrast  10/29/2012   *RADIOLOGY REPORT*  Clinical Data: Lung cancer.  Restaging. Recurrent non-small cell lung cancer diagnosed in November 2009.  Left upper lobe resection January 2011.  CT CHEST WITH CONTRAST  Technique:  Multidetector CT imaging of the chest was performed following the standard protocol during bolus administration of intravenous contrast.  Contrast: 80mL OMNIPAQUE IOHEXOL 300 MG/ML  SOLN  Comparison: 01/18/2012.  Findings: Lungs/pleura: Surgical and treatment changes within the left upper lobe.  Minimal progression of radiation induced  consolidation posteriorly.  No well-defined recurrent mass.  Right lung clear. Similar left-sided pleural thickening, primarily superiorly. Similar focal anterior pleural thickening between the fifth and sixth left ribs.  No abnormal pleural enhancement to suggest pleural recurrence.  Heart/Mediastinum: Right-sided Port-A-Cath which terminates at the cavoatrial junction.  Redemonstrated is hypoattenuation in the region of the catheter, including its tip.  This could be artifactual secondary beam hardening.  Sub centimeter low density thyroid nodules which are nonspecific. Normal heart size, without pericardial effusion.  Similar high left mediastinal node 6 mm on image 12/series 2.   No hilar adenopathy.  Upper abdomen: Normal adrenal glands.  Upper pole left renal low density lesions which are likely cysts.  Bones/Musculoskeletal:  Left-sided rib surgical defects.  IMPRESSION: Surgical and treatment effects in the left upper lobe and adjacent superior segment left lower lobe.  No evidence of locally recurrent or metastatic disease.   Original Report Authenticated By: Jeronimo Greaves, M.D.    ASSESSMENT AND PLAN: This is a very pleasant 47 years old African American female with recurrent non-small cell lung cancer status post concurrent chemoradiation and currently on observation with no evidence for disease progression. I discussed the scan results with the patient and her husband. I recommended for her to continue on observation with repeat CT scan of the chest in 6 months. I strongly encouraged the patient to quit smoking and offered her smoke cessation program. She was advised to call immediately if she has any concerning symptoms in the interval. The patient voices understanding of current disease status and treatment options and is in agreement with the current care plan.  All questions were answered. The patient knows to call the clinic with any problems, questions or concerns. We can certainly see the  patient much sooner if necessary.

## 2012-10-31 NOTE — Progress Notes (Signed)
Work note for pt's husband signed by Dr Donnald Garre.  SLJ

## 2012-11-01 ENCOUNTER — Other Ambulatory Visit: Payer: Self-pay | Admitting: Orthopedic Surgery

## 2012-11-01 DIAGNOSIS — M25552 Pain in left hip: Secondary | ICD-10-CM

## 2012-11-01 DIAGNOSIS — M25551 Pain in right hip: Secondary | ICD-10-CM

## 2012-11-09 ENCOUNTER — Ambulatory Visit: Payer: Medicare Other | Admitting: Family Medicine

## 2012-11-14 ENCOUNTER — Encounter: Payer: Self-pay | Admitting: Cardiovascular Disease

## 2012-11-16 ENCOUNTER — Ambulatory Visit (INDEPENDENT_AMBULATORY_CARE_PROVIDER_SITE_OTHER): Payer: Medicare Other | Admitting: Family Medicine

## 2012-11-16 ENCOUNTER — Encounter: Payer: Self-pay | Admitting: Family Medicine

## 2012-11-16 VITALS — BP 120/85 | HR 76 | Temp 98.2°F | Wt 227.0 lb

## 2012-11-16 DIAGNOSIS — L680 Hirsutism: Secondary | ICD-10-CM

## 2012-11-16 DIAGNOSIS — E876 Hypokalemia: Secondary | ICD-10-CM

## 2012-11-16 DIAGNOSIS — E119 Type 2 diabetes mellitus without complications: Secondary | ICD-10-CM

## 2012-11-16 DIAGNOSIS — N183 Chronic kidney disease, stage 3 unspecified: Secondary | ICD-10-CM

## 2012-11-16 DIAGNOSIS — J019 Acute sinusitis, unspecified: Secondary | ICD-10-CM

## 2012-11-16 MED ORDER — DOXYCYCLINE HYCLATE 100 MG PO TABS: 100.0000 mg | ORAL_TABLET | Freq: Two times a day (BID) | ORAL | Status: DC

## 2012-11-16 MED ORDER — POTASSIUM CHLORIDE CRYS ER 20 MEQ PO TBCR: 40.0000 meq | EXTENDED_RELEASE_TABLET | ORAL | Status: DC

## 2012-11-16 MED ORDER — ALBUTEROL SULFATE HFA 108 (90 BASE) MCG/ACT IN AERS: 2.0000 | INHALATION_SPRAY | RESPIRATORY_TRACT | Status: DC | PRN

## 2012-11-16 MED ORDER — FLUTICASONE PROPIONATE 50 MCG/ACT NA SUSP: 2.0000 | Freq: Every day | NASAL | Status: DC

## 2012-11-16 MED ORDER — METHYLPREDNISOLONE SODIUM SUCC 125 MG IJ SOLR
125.0000 mg | Freq: Once | INTRAMUSCULAR | Status: AC
  Administered 2012-11-16: 125 mg via INTRAMUSCULAR

## 2012-11-16 NOTE — Assessment & Plan Note (Signed)
Increased KDUR to 40mg  daily Not taking spiro No CV symptoms.  Likely secondary to CKD III

## 2012-11-16 NOTE — Assessment & Plan Note (Signed)
Needs referral to Renal as not established

## 2012-11-16 NOTE — Assessment & Plan Note (Signed)
Acute Sinusitis ongoing for greater than 7 days

## 2012-11-16 NOTE — Progress Notes (Signed)
Theresa [name] is a 47 y.o. female who presents to [facility] today for wt loss and sinus congestion  Cancer: keeping port in until next year per Oncologist  Hypopotassium: takin KDUR 20mg  daily and on spiro and lisinopril. Last K of 3.1. Deneis pallpitations, CP, SOB. CKD III.  Sinus infection: started 1 wk ago. Getting worse. Deneis fevers. Facial fullness and pain. Not taking Flonase. Sore throat and productive cough. Also complaining of sore throat and some difficulty breathing  Facial hair: tried eflornithine w/o benefit. Spironolactone w/o benefit. hydrocoritsone cream w/o benefit.   Tobacco: off chantix. Now up to 7 in a day. Was only on 2 per day on chantix.   The following portions of the patient's history were reviewed and updated as appropriate: allergies, current medications, past medical history, family and social history, and problem list.  Patient is a smoker  Past Medical History  Diagnosis Date  . Hypertension   . Hyperlipidemia   . Blindness, legal     glaucoma and retinitis  pigmentosa  . Glaucoma   . Onychomycosis   . Retinitis pigmentosa   . Chronic eczema   . Diabetes mellitus type II     diet controlled  . Fibromyalgia   . Anxiety   . Lung cancer     lung ca dx 2010  . Hx of radiation therapy 01-17-11 to 03-18-11    lung  . History of chemotherapy 01/2011 to 03/2011    concurrent w/radiation therapy  . Chronic headaches 10-17-11    migraines   ROS as above otherwise neg.    Medications reviewed. Current Outpatient Prescriptions  Medication Sig Dispense Refill  . albuterol (PROVENTIL HFA;VENTOLIN HFA) 108 (90 BASE) MCG/ACT inhaler Inhale 2 puffs into the lungs every 4 (four) hours as needed. For wheezing      . amLODipine (NORVASC) 10 MG tablet Take 1 tablet (10 mg total) by mouth every morning.  90 tablet  3  . atorvastatin (LIPITOR) 40 MG tablet Take 1 tablet (40 mg total) by mouth daily.  90 tablet  3  . Blood Glucose Monitoring Suppl (ONE TOUCH ULTRA 2)  W/DEVICE KIT 1 each by Does not apply route every morning.  1 each  0  . carvedilol (COREG) 25 MG tablet Take 1 tablet (25 mg total) by mouth 2 (two) times daily with a meal.  180 tablet  3  . fluticasone (FLONASE) 50 MCG/ACT nasal spray Place 2 sprays into the nose daily.  16 g  6  . gabapentin (NEURONTIN) 300 MG capsule Take 1 capsule (300 mg total) by mouth 3 (three) times daily.  270 capsule  3  . glipiZIDE (GLUCOTROL) 5 MG tablet Take 1 tablet (5 mg total) by mouth 2 (two) times daily before a meal.  180 tablet  3  . glucose blood (ONE TOUCH ULTRA TEST) test strip Check fasting blood sugar once daily q AM  100 each  12  . Lancets (ONETOUCH ULTRASOFT) lancets Use as instructed  100 each  12  . omeprazole (PRILOSEC) 40 MG capsule Take 1 capsule (40 mg total) by mouth daily.  30 capsule  11  . potassium chloride SA (K-DUR,KLOR-CON) 20 MEQ tablet Take 1 tablet (20 mEq total) by mouth as directed. 1 tablet daily x 7 days  7 tablet  0  . PROAIR HFA 108 (90 BASE) MCG/ACT inhaler INHALE TWO PUFFS BY MOUTH EVERY FOUR HOURS AS NEEDED  17 each  2  . TRIAMCINOLONE ACETONIDE, TOP, 0.05 % OINT  Apply 1 application topically 2 (two) times daily.  17 g  0  . valsartan-hydrochlorothiazide (DIOVAN-HCT) 160-12.5 MG per tablet Take 1 tablet by mouth every morning.  90 tablet  3  . varenicline (CHANTIX) 0.5 MG tablet Take 1 tablet (0.5 mg total) by mouth 2 (two) times daily.  30 tablet  1  . venlafaxine XR (EFFEXOR XR) 150 MG 24 hr capsule Take 1 capsule (150 mg total) by mouth daily.  30 capsule  1   No current facility-administered medications for this visit.    Exam: BP 120/85  Pulse 76  Temp(Src) 98.2 F (36.8 C) (Oral)  Wt 227 lb (102.967 kg)  BMI 41.51 kg/m2 Gen: Well NAD HEENT: EOMI,  MMM, frontal and maxillary sinus tenderness on palpation Lungs: CTABL Nl WOB, no airway obstruction Heart: RRR no MRG Abd: NABS, NT, ND Exts: Non edematous BL  LE, warm and well perfused.  No results found for  this or any previous visit (from the past 72 hour(s)).   125mg  SOLUMEDROL administerted in clinic

## 2012-11-16 NOTE — Assessment & Plan Note (Signed)
Continue glipizide 

## 2012-11-16 NOTE — Patient Instructions (Addendum)
Please start using the Flonase every night Start using your Afrin Start using nasal saline Please start the antibiotics and take them daily. Please start taking the increased potassium dose Please stop the venlafaxine.  Please come back in 1 week for blood work. Please start looking into laser hair removal clinics or dermatologists (insurance should pay for this) Please come back to see me in 2 weeks.

## 2012-11-16 NOTE — Assessment & Plan Note (Signed)
PCOS Theresa [name] and topical creams not working Will likely need laser hair removal. Insurance should pay for this as other therapies have failed.

## 2012-12-07 ENCOUNTER — Ambulatory Visit: Payer: Medicare Other | Admitting: Family Medicine

## 2012-12-12 ENCOUNTER — Institutional Professional Consult (permissible substitution): Payer: Medicare Other | Admitting: Cardiovascular Disease

## 2012-12-21 ENCOUNTER — Encounter: Payer: Self-pay | Admitting: Family Medicine

## 2012-12-21 ENCOUNTER — Ambulatory Visit (INDEPENDENT_AMBULATORY_CARE_PROVIDER_SITE_OTHER): Payer: Medicare Other | Admitting: Family Medicine

## 2012-12-21 VITALS — BP 118/80 | HR 84 | Temp 98.3°F | Wt 229.0 lb

## 2012-12-21 DIAGNOSIS — L680 Hirsutism: Secondary | ICD-10-CM

## 2012-12-21 DIAGNOSIS — F339 Major depressive disorder, recurrent, unspecified: Secondary | ICD-10-CM

## 2012-12-21 DIAGNOSIS — R252 Cramp and spasm: Secondary | ICD-10-CM

## 2012-12-21 DIAGNOSIS — M79605 Pain in left leg: Secondary | ICD-10-CM

## 2012-12-21 DIAGNOSIS — M79609 Pain in unspecified limb: Secondary | ICD-10-CM

## 2012-12-21 DIAGNOSIS — F17200 Nicotine dependence, unspecified, uncomplicated: Secondary | ICD-10-CM

## 2012-12-21 DIAGNOSIS — G4733 Obstructive sleep apnea (adult) (pediatric): Secondary | ICD-10-CM | POA: Insufficient documentation

## 2012-12-21 LAB — BASIC METABOLIC PANEL
BUN: 12 mg/dL (ref 6–23)
CO2: 30 mEq/L (ref 19–32)
Calcium: 9.6 mg/dL (ref 8.4–10.5)
Chloride: 102 mEq/L (ref 96–112)
Creat: 1.45 mg/dL — ABNORMAL HIGH (ref 0.50–1.10)
Glucose, Bld: 231 mg/dL — ABNORMAL HIGH (ref 70–99)
Potassium: 3.6 mEq/L (ref 3.5–5.3)
Sodium: 138 mEq/L (ref 135–145)

## 2012-12-21 MED ORDER — BUPROPION HCL ER (SR) 150 MG PO TB12: 150.0000 mg | ORAL_TABLET | Freq: Two times a day (BID) | ORAL | Status: DC

## 2012-12-21 MED ORDER — OMEPRAZOLE 40 MG PO CPDR: 40.0000 mg | DELAYED_RELEASE_CAPSULE | Freq: Every day | ORAL | Status: DC

## 2012-12-21 MED ORDER — EFLORNITHINE HCL 13.9 % EX CREA: 1.0000 "application " | TOPICAL_CREAM | Freq: Two times a day (BID) | CUTANEOUS | Status: DC

## 2012-12-21 NOTE — Assessment & Plan Note (Signed)
A: unchanged P: Refilled vaniqu Recommended waxing

## 2012-12-21 NOTE — Assessment & Plan Note (Signed)
A: declined. Due to noncompliance. Noncompliant due to cost.   P: I have placed a new referral for a sleep study. The center will call you. Once the settings are established, Dr. Konrad Dolores will work on getting your supplies from [facility].

## 2012-12-21 NOTE — Assessment & Plan Note (Signed)
A: discussion deferred this visit due to time. Patient could be a good candidate for surgical weight loss.

## 2012-12-21 NOTE — Assessment & Plan Note (Signed)
A: ready to quit.  P: Set quit date Start wellbutrin Quit 5-7 days after starting wellbutrin.  Smoking cessation support: smoking cessation hotline: 1-800-QUIT-NOW.  Here is the number to the smoking cessation classes at [facility]: ([phone]

## 2012-12-21 NOTE — Patient Instructions (Signed)
Theresa [name],  Thank you for coming in to see me today. It was a pleasure to see you again and meet your son, Theresa [name].   For your depression: I recommend that you meet with our psychologist, Dr. Spero Geralds, for help dealing with your depression.  You can schedule an appointment with her by calling her directly at [phone].  Start Wellbutrin 150 mg twice daily.   Follow up with Dr. Konrad Dolores in two weeks.    For smoking: Set quit date Start wellbutrin Quit 5-7 days after starting wellbutrin.  Smoking cessation support: smoking cessation hotline: 1-800-QUIT-NOW.  Here is the number to the smoking cessation classes at [facility]: [phone]   For poor sleep: I have placed a new referral for a sleep study. The center will call you. Once the settings are established, Dr. Konrad Dolores will work on getting your supplies from [facility].   Follow up with Dr. Konrad Dolores in two weeks. Dr. Armen Pickup

## 2012-12-21 NOTE — Assessment & Plan Note (Signed)
Recurrent leg cramping. Suspect PAD more than hypokalemia.   P: Repeat BMP Advised patient not to take supplemental kdur given CKD if K is normal.

## 2012-12-21 NOTE — Progress Notes (Signed)
  Subjective:    Patient ID: Theresa [name], female    DOB: 1965/03/26, 47 y.o.   MRN: [id number]  HPI 47 year old female with history of lung cancer, current smoker, obstructive sleep apnea, depression, chronic pain, CKD and obesity  presents for same-day visit with her son Theresa [name] to discuss the following:  #1 depression: Patient reports worsening depressive symptoms for the past 3 months and she lost her job and required surgery on her low back. She has symptoms daily. She denies SI. Lives at home with husband and two adult son.   Previous treatment: She's tried multiple medications in the past (SSRI, SSRI, amitriptyline most recently). She's not tolerate these medications well. She last had excessive jitteriness while taking amitriptyline. She previous therapy.   Personal history: Patient reports history of depression since his her teens.  Family history: She also reports a strong family history depression in her mother, maternal grandmother, sister, daughter, son.   Substance: Patient smokes 1 pack per day of [place] regular. She denies alcohol, drugs use.  PHQ-9: score of 21, 0-1 and 9. 3-2-8. Extremely difficult to function.   #2 poor sleep: Patient reports that she sleeps from 3:30 to 5 or 6 in the morning. She wakes up feeling tired. She's his history of obstructive sleep apnea. She is not using her CPAP for many years due to lack of supplies. She does snore. Her CPAP supplier was [employer] the phone number [phone] to c and losest office is in [facility].   #3 hirsutism: Patient has persistent hirsutism despite use of multiple home and prescription remedies. She requests a refill on VANIQU. She has not been successful weight loss.   #4 smoking:  1 PPD [place] light. Intermittent cough. Would like to quit. Most successful with chantix in the past.   #5 leg cramping: improved with supplemental K+. Recurred once stopping K+. Still taking some supplemental  K+, took two tabs today because she had a big bottle.    Review of Systems As per HPI     Objective:   Physical Exam BP 118/80  Pulse 84  Temp(Src) 98.3 F (36.8 C) (Oral)  Wt 229 lb (103.874 kg)  BMI 41.87 kg/m2 General appearance: alert, cooperative, no distress and morbidly obese Lungs: clear to auscultation bilaterally Heart: regular rate and rhythm, S1, S2 normal, no murmur, click, rub or gallop Psych: depressed affect.      Assessment & Plan:

## 2012-12-21 NOTE — Assessment & Plan Note (Signed)
For your recurrent major depression: I recommend that you meet with our psychologist, Dr. Spero Geralds, for help dealing with your depression.  You can schedule an appointment with her by calling her directly at [phone].  Start Wellbutrin 150 mg twice daily.   Follow up with Dr. Konrad Dolores in two weeks.

## 2012-12-25 ENCOUNTER — Encounter: Payer: Self-pay | Admitting: Family Medicine

## 2013-01-02 ENCOUNTER — Ambulatory Visit (INDEPENDENT_AMBULATORY_CARE_PROVIDER_SITE_OTHER): Payer: Medicare Other | Admitting: Cardiovascular Disease

## 2013-01-02 ENCOUNTER — Encounter: Payer: Self-pay | Admitting: Cardiovascular Disease

## 2013-01-02 VITALS — BP 142/70 | HR 83 | Ht 62.0 in | Wt 230.0 lb

## 2013-01-02 DIAGNOSIS — I10 Essential (primary) hypertension: Secondary | ICD-10-CM

## 2013-01-02 DIAGNOSIS — E876 Hypokalemia: Secondary | ICD-10-CM

## 2013-01-02 DIAGNOSIS — R0789 Other chest pain: Secondary | ICD-10-CM

## 2013-01-02 DIAGNOSIS — E785 Hyperlipidemia, unspecified: Secondary | ICD-10-CM

## 2013-01-02 MED ORDER — POTASSIUM CHLORIDE CRYS ER 20 MEQ PO TBCR: 20.0000 meq | EXTENDED_RELEASE_TABLET | Freq: Every day | ORAL | Status: DC

## 2013-01-02 NOTE — Progress Notes (Signed)
Theresa [name] Date of Birth  01-02-1966       [facility]    [place] [address]  [address] [place], [place]  [address]   [place], [place]  [address] [phone]     [phone]   Fax  [phone]    Fax [phone]  Problem List: 1.  palpitations 2. Hypertension 3. Hyperlipidemia 4. Blindness - retinitis pigmentosa 5. Type 2 diabetes mellitus 6. Lung cancer-diagnosed 2010, radiation therapy and chemotherapy 7. History of fibromyalgia   History of Present Illness:  Theresa [name] is a 47 yo with multiple medical problems as noted above.  She has been found to have hypokalemia and was also having some heart palpitations. She's been prescribed potassium supplements and the palpitations went away. The palpitations return after she discontinued the potassium supplements.  The palpitations are described as a heart irregularity that last for about an hour.   These HR irregularities will  suddenly resolved and her heart rhythm will then return back to normal.  She is active at home but does not get any regular aerobic exercise.    Current Outpatient Prescriptions on File Prior to Visit  Medication Sig Dispense Refill  . albuterol (PROVENTIL HFA;VENTOLIN HFA) 108 (90 BASE) MCG/ACT inhaler Inhale 2 puffs into the lungs every 4 (four) hours as needed. For wheezing  8.5 g  6  . amLODipine (NORVASC) 10 MG tablet Take 1 tablet (10 mg total) by mouth every morning.  90 tablet  3  . atorvastatin (LIPITOR) 40 MG tablet Take 1 tablet (40 mg total) by mouth daily.  90 tablet  3  . Blood Glucose Monitoring Suppl (ONE TOUCH ULTRA 2) W/DEVICE KIT 1 each by Does not apply route every morning.  1 each  0  . buPROPion (WELLBUTRIN SR) 150 MG 12 hr tablet Take 1 tablet (150 mg total) by mouth 2 (two) times daily.  60 tablet  1  . carvedilol (COREG) 25 MG tablet Take 1 tablet (25 mg total) by mouth 2 (two) times daily with a meal.  180 tablet  3  .  Eflornithine HCl 13.9 % cream Apply 1 application topically 2 (two) times daily with a meal.  45 g  0  . fluticasone (FLONASE) 50 MCG/ACT nasal spray Place 2 sprays into the nose daily.  16 g  6  . gabapentin (NEURONTIN) 300 MG capsule Take 1 capsule (300 mg total) by mouth 3 (three) times daily.  270 capsule  3  . glipiZIDE (GLUCOTROL) 5 MG tablet Take 1 tablet (5 mg total) by mouth 2 (two) times daily before a meal.  180 tablet  3  . glucose blood (ONE TOUCH ULTRA TEST) test strip Check fasting blood sugar once daily q AM  100 each  12  . Lancets (ONETOUCH ULTRASOFT) lancets Use as instructed  100 each  12  . omeprazole (PRILOSEC) 40 MG capsule Take 1 capsule (40 mg total) by mouth daily.  90 capsule  3  . potassium chloride SA (K-DUR,KLOR-CON) 20 MEQ tablet Take 2 tablets (40 mEq total) by mouth as directed. 1 tablet daily x 7 days  60 tablet  0  . PROAIR HFA 108 (90 BASE) MCG/ACT inhaler INHALE TWO PUFFS BY MOUTH EVERY FOUR HOURS AS NEEDED  17 each  2  . TRIAMCINOLONE ACETONIDE, TOP, 0.05 % OINT Apply 1 application topically 2 (two) times daily.  17 g  0  . valsartan-hydrochlorothiazide (DIOVAN-HCT) 160-12.5 MG per  tablet Take 1 tablet by mouth every morning.  90 tablet  3   No current facility-administered medications on file prior to visit.    Allergies  Allergen Reactions  . Morphine And Related Nausea Only  . Codeine     REACTION: GI upset  . Penicillins     REACTION: hives    Past Medical History  Diagnosis Date  . Hypertension   . Hyperlipidemia   . Blindness, legal     glaucoma and retinitis  pigmentosa  . Glaucoma   . Onychomycosis   . Retinitis pigmentosa   . Chronic eczema   . Diabetes mellitus type II     diet controlled  . Fibromyalgia   . Anxiety   . Lung cancer     lung ca dx 2010  . Hx of radiation therapy 01-17-11 to 03-18-11    lung  . History of chemotherapy 01/2011 to 03/2011    concurrent w/radiation therapy  . Chronic headaches 10-17-11    migraines     Past Surgical History  Procedure Laterality Date  . Lung surgery  03/24/08    L vats, L thoracotomy and LUL trisegmentectomy with node dissection  . Chest tube insertion   04/01/08    L hydropneumothorax  . Back surgery    . Eye surgery      lens implant  . Portacath placement  02/11/2011    Procedure: INSERTION PORT-A-CATH;  Surgeon: Norton Blizzard, MD;  Location: [facility] OR;  Service: Thoracic;  Laterality: Right;  9.6Fr. Pre-attached Power Port in Right Internal Jugular  -right chest-remains inplace 10-17-11  . Knee arthroscopy  10-17-11    bil. knee scope(left was torn ligament)  . Lumbar laminectomy/decompression microdiscectomy  10/27/2011    Procedure: LUMBAR LAMINECTOMY/DECOMPRESSION MICRODISCECTOMY;  Surgeon: Javier Docker, MD;  Location: [facility] ORS;  Service: Orthopedics;  Laterality: N/A;  L4-L5  . Abdominal hysterectomy  09/19/2001    hx. of fibroids. TAH. Ovaries remain.     History  Smoking status  . Current Every Day Smoker -- 0.50 packs/day for 30 years  . Types: Cigarettes  . Last Attempt to Quit: 11/13/2007  Smokeless tobacco  . Never Used    Comment: using chantix, hx 1 1/2 PPD(has decrease to 3 cigs per day)    History  Alcohol Use  . Yes    Comment: occasional    Family History  Problem Relation Age of Onset  . Cancer Maternal Aunt     brain  . Depression Mother   . Heart disease Mother   . Depression Son   . Diabetes Maternal Grandmother   . Stroke Maternal Grandmother   . Depression Maternal Grandmother   . Depression Daughter     Reviw of Systems:  Reviewed in the HPI.  All other systems are negative.  Physical Exam: Blood pressure 142/70, pulse 83, height 5\' 2"  (1.575 m), weight 230 lb (104.327 kg). General: Well developed, well nourished, in no acute distress.  Morbidly obese  Head: Normocephalic, atraumatic, sclera non-icteric, mucus membranes are moist,   Neck: Supple. Carotids are 2 + without bruits. No JVD   Lungs: Clear   Heart: RR,  normal S1, S2  Abdomen: Soft, non-tender, non-distended with normal bowel sounds.  Msk:  Strength and tone are normal   Extremities: No clubbing or cyanosis. No edema.  Distal pedal pulses are 2+ and equal    Neuro: CN II - XII intact.  Alert and oriented X 3.   Psych:  Normal   ECG: Oct. 22, 2014:  NSR at 83.  TWI in the lateral leads - new since previous ECG.  Assessment / Plan:

## 2013-01-02 NOTE — Assessment & Plan Note (Addendum)
Theresa [name]  was referred over for a chest tightness. Her symptoms are actually more like palpitations. She describes heartbeat irregularities that last for about an hour or so.   She says that these palpitations are occasionally associated with some chest tightness.  She does have T-wave inversions in the lateral leads. For that reason, we will schedule her for a 2 day lexiscan myoview study.  She has a history of hypokalemia and is on HCTZ. I think that she needs to be continued on potassium chloride 20 mg a day. She's been advised her medical doctor not to take the potassium  every day-I assume is because of her chronic kidney disease.  She has documented potassium levels that have almost always been low. She's never had recorded potassium level of greater than 4. I think it would be very safe for her to continue with the potassium supplementation and I think that it will help resolve several of her symptoms. She also has documented leg cramps which are also probably due to hypokalemia. We will draw her basic metabolic profile in approximately 2 weeks.   We will get a 2 day Lexiscan myoview.  I will see her back in 2 months.

## 2013-01-02 NOTE — Patient Instructions (Signed)
Your physician has requested that you have a lexiscan myoview. . Please follow instruction sheet, as given.  Your physician recommends that you return for lab work in: 2 WEEKS BMET  Your physician recommends that you schedule a follow-up appointment in: 1-2 MONTHS WITH DR [facility]  Your physician has recommended you make the following change in your medication:  RESTART KDUR/ POTASSIUM CHLORIDE 20 MEQ DAILY

## 2013-01-08 ENCOUNTER — Ambulatory Visit (HOSPITAL_BASED_OUTPATIENT_CLINIC_OR_DEPARTMENT_OTHER): Payer: Medicare Other

## 2013-01-08 VITALS — BP 137/85 | HR 81 | Temp 99.0°F | Resp 20

## 2013-01-08 DIAGNOSIS — C3490 Malignant neoplasm of unspecified part of unspecified bronchus or lung: Secondary | ICD-10-CM

## 2013-01-08 DIAGNOSIS — Z452 Encounter for adjustment and management of vascular access device: Secondary | ICD-10-CM

## 2013-01-08 DIAGNOSIS — C3410 Malignant neoplasm of upper lobe, unspecified bronchus or lung: Secondary | ICD-10-CM

## 2013-01-08 MED ORDER — HEPARIN SOD (PORK) LOCK FLUSH 100 UNIT/ML IV SOLN
500.0000 [IU] | Freq: Once | INTRAVENOUS | Status: AC
  Administered 2013-01-08: 500 [IU] via INTRAVENOUS
  Filled 2013-01-08: qty 5

## 2013-01-08 MED ORDER — SODIUM CHLORIDE 0.9 % IJ SOLN
10.0000 mL | INTRAMUSCULAR | Status: DC | PRN
  Administered 2013-01-08: 10 mL via INTRAVENOUS
  Filled 2013-01-08: qty 10

## 2013-01-08 NOTE — Patient Instructions (Signed)
Implanted Port Instructions  An implanted port is a central line that has a round shape and is placed under the skin. It is used for long-term IV (intravenous) access for:  · Medicine.  · Fluids.  · Liquid nutrition, such as TPN (total parenteral nutrition).  · Blood samples.  Ports can be placed:  · In the chest area just below the collarbone (this is the most common place.)  · In the arms.  · In the belly (abdomen) area.  · In the legs.  PARTS OF THE PORT  A port has 2 main parts:  · The reservoir. The reservoir is round, disc-shaped, and will be a small, raised area under your skin.  · The reservoir is the part where a needle is inserted (accessed) to either give medicines or to draw blood.  · The catheter. The catheter is a long, slender tube that extends from the reservoir. The catheter is placed into a large vein.  · Medicine that is inserted into the reservoir goes into the catheter and then into the vein.  INSERTION OF THE PORT  · The port is surgically placed in either an operating room or in a procedural area (interventional radiology).  · Medicine may be given to help you relax during the procedure.  · The skin where the port will be inserted is numbed (local anesthetic).  · 1 or 2 small cuts (incisions) will be made in the skin to insert the port.  · The port can be used after it has been inserted.  INCISION SITE CARE  · The incision site may have small adhesive strips on it. This helps keep the incision site closed. Sometimes, no adhesive strips are placed. Instead of adhesive strips, a special kind of surgical glue is used to keep the incision closed.  · If adhesive strips were placed on the incision sites, do not take them off. They will fall off on their own.  · The incision site may be sore for 1 to 2 days. Pain medicine can help.  · Do not get the incision site wet. Bathe or shower as directed by your caregiver.  · The incision site should heal in 5 to 7 days. A small scar may form after the  incision has healed.  ACCESSING THE PORT  Special steps must be taken to access the port:  · Before the port is accessed, a numbing cream can be placed on the skin. This helps numb the skin over the port site.  · A sterile technique is used to access the port.  · The port is accessed with a needle. Only "non-coring" port needles should be used to access the port. Once the port is accessed, a blood return should be checked. This helps ensure the port is in the vein and is not clogged (clotted).  · If your caregiver believes your port should remain accessed, a clear (transparent) bandage will be placed over the needle site. The bandage and needle will need to be changed every week or as directed by your caregiver.  · Keep the bandage covering the needle clean and dry. Do not get it wet. Follow your caregiver's instructions on how to take a shower or bath when the port is accessed.  · If your port does not need to stay accessed, no bandage is needed over the port.  FLUSHING THE PORT  Flushing the port keeps it from getting clogged. How often the port is flushed depends on:  · If a   constant infusion is running. If a constant infusion is running, the port may not need to be flushed.  · If intermittent medicines are given.  · If the port is not being used.  For intermittent medicines:  · The port will need to be flushed:  · After medicines have been given.  · After blood has been drawn.  · As part of routine maintenance.  · A port is normally flushed with:  · Normal saline.  · Heparin.  · Follow your caregiver's advice on how often, how much, and the type of flush to use on your port.  IMPORTANT PORT INFORMATION  · Tell your caregiver if you are allergic to heparin.  · After your port is placed, you will get a manufacturer's information card. The card has information about your port. Keep this card with you at all times.  · There are many types of ports available. Know what kind of port you have.  · In case of an  emergency, it may be helpful to wear a medical alert bracelet. This can help alert health care workers that you have a port.  · The port can stay in for as long as your caregiver believes it is necessary.  · When it is time for the port to come out, surgery will be done to remove it. The surgery will be similar to how the port was put in.  · If you are in the hospital or clinic:  · Your port will be taken care of and flushed by a nurse.  · If you are at home:  · A home health care nurse may give medicines and take care of the port.  · You or a family member can get special training and directions for giving medicine and taking care of the port at home.  SEEK IMMEDIATE MEDICAL CARE IF:   · Your port does not flush or you are unable to get a blood return.  · New drainage or pus is coming from the incision.  · A bad smell is coming from the incision site.  · You develop swelling or increased redness at the incision site.  · You develop increased swelling or pain at the port site.  · You develop swelling or pain in the surrounding skin near the port.  · You have an oral temperature above 102° F (38.9° C), not controlled by medicine.  MAKE SURE YOU:   · Understand these instructions.  · Will watch your condition.  · Will get help right away if you are not doing well or get worse.  Document Released: 02/28/2005 Document Revised: 05/23/2011 Document Reviewed: 05/22/2008  ExitCare® Patient Information ©2014 [employer].

## 2013-01-18 ENCOUNTER — Encounter (HOSPITAL_BASED_OUTPATIENT_CLINIC_OR_DEPARTMENT_OTHER): Payer: Medicare Other

## 2013-01-20 ENCOUNTER — Ambulatory Visit (HOSPITAL_BASED_OUTPATIENT_CLINIC_OR_DEPARTMENT_OTHER): Payer: Medicare Other | Attending: Family Medicine | Admitting: Radiology

## 2013-01-20 VITALS — Ht 64.0 in | Wt 229.0 lb

## 2013-01-20 DIAGNOSIS — G4733 Obstructive sleep apnea (adult) (pediatric): Secondary | ICD-10-CM | POA: Insufficient documentation

## 2013-01-20 DIAGNOSIS — R0609 Other forms of dyspnea: Secondary | ICD-10-CM | POA: Insufficient documentation

## 2013-01-20 DIAGNOSIS — R0989 Other specified symptoms and signs involving the circulatory and respiratory systems: Secondary | ICD-10-CM | POA: Insufficient documentation

## 2013-01-24 ENCOUNTER — Ambulatory Visit (HOSPITAL_COMMUNITY): Payer: Medicare Other | Attending: Cardiology | Admitting: Radiology

## 2013-01-24 ENCOUNTER — Encounter: Payer: Self-pay | Admitting: Cardiology

## 2013-01-24 ENCOUNTER — Ambulatory Visit (INDEPENDENT_AMBULATORY_CARE_PROVIDER_SITE_OTHER): Payer: Medicare Other | Admitting: *Deleted

## 2013-01-24 VITALS — BP 112/77 | Ht 62.0 in | Wt 226.0 lb

## 2013-01-24 DIAGNOSIS — Z87891 Personal history of nicotine dependence: Secondary | ICD-10-CM | POA: Insufficient documentation

## 2013-01-24 DIAGNOSIS — R0609 Other forms of dyspnea: Secondary | ICD-10-CM | POA: Insufficient documentation

## 2013-01-24 DIAGNOSIS — C3490 Malignant neoplasm of unspecified part of unspecified bronchus or lung: Secondary | ICD-10-CM

## 2013-01-24 DIAGNOSIS — I10 Essential (primary) hypertension: Secondary | ICD-10-CM

## 2013-01-24 DIAGNOSIS — E785 Hyperlipidemia, unspecified: Secondary | ICD-10-CM | POA: Insufficient documentation

## 2013-01-24 DIAGNOSIS — R42 Dizziness and giddiness: Secondary | ICD-10-CM | POA: Insufficient documentation

## 2013-01-24 DIAGNOSIS — R0602 Shortness of breath: Secondary | ICD-10-CM

## 2013-01-24 DIAGNOSIS — E119 Type 2 diabetes mellitus without complications: Secondary | ICD-10-CM | POA: Insufficient documentation

## 2013-01-24 DIAGNOSIS — R079 Chest pain, unspecified: Secondary | ICD-10-CM

## 2013-01-24 DIAGNOSIS — R0989 Other specified symptoms and signs involving the circulatory and respiratory systems: Secondary | ICD-10-CM | POA: Insufficient documentation

## 2013-01-24 DIAGNOSIS — R0789 Other chest pain: Secondary | ICD-10-CM

## 2013-01-24 DIAGNOSIS — R9431 Abnormal electrocardiogram [ECG] [EKG]: Secondary | ICD-10-CM | POA: Insufficient documentation

## 2013-01-24 DIAGNOSIS — E876 Hypokalemia: Secondary | ICD-10-CM

## 2013-01-24 LAB — BASIC METABOLIC PANEL
BUN: 10 mg/dL (ref 6–23)
CO2: 26 mEq/L (ref 19–32)
Calcium: 8.9 mg/dL (ref 8.4–10.5)
Chloride: 103 mEq/L (ref 96–112)
Creatinine, Ser: 1.4 mg/dL — ABNORMAL HIGH (ref 0.4–1.2)
GFR: 51.26 mL/min — ABNORMAL LOW (ref >60.00)
Glucose, Bld: 158 mg/dL — ABNORMAL HIGH (ref 70–99)
Potassium: 3.4 mEq/L — ABNORMAL LOW (ref 3.5–5.1)
Sodium: 137 mEq/L (ref 135–145)

## 2013-01-24 MED ORDER — REGADENOSON 0.4 MG/5ML IV SOLN
0.4000 mg | Freq: Once | INTRAVENOUS | Status: AC
  Administered 2013-01-24: 0.4 mg via INTRAVENOUS

## 2013-01-24 MED ORDER — TECHNETIUM TC 99M SESTAMIBI GENERIC - CARDIOLITE
30.0000 | Freq: Once | INTRAVENOUS | Status: AC | PRN
  Administered 2013-01-24: 30 via INTRAVENOUS

## 2013-01-24 NOTE — Progress Notes (Signed)
[facility] SITE 3 NUCLEAR MED [address] [place], [place] [address] [phone]    Cardiology Nuclear Med Study  Theresa [name] is a 47 y.o. female     MRN : [id number]     DOB: January 19, 1966  Procedure Date: 01/24/2013  Nuclear Med Background Indication for Stress Test:  Evaluation for Ischemia and Abnormal EKG History:  No known CAD Cardiac Risk Factors: Family History - CAD, History of Smoking, Hypertension, Lipids and NIDDM  Symptoms:  Chest Pain, Dizziness and DOE   Nuclear Pre-Procedure Caffeine/Decaff Intake:  None NPO After: 7:00pm   Lungs:  clear O2 Sat: 94% on room air. IV 0.9% NS with Angio Cath:  22g  IV Site: R Hand  IV Started by:  Bonnita Levan, RN  Chest Size (in):  42 Cup Size: D  Height: 5\' 2"  (1.575 m)  Weight:  226 lb (102.513 kg)  BMI:  Body mass index is 41.33 kg/(m^2). Tech Comments:  N/A    Nuclear Med Study 1 or 2 day study: 2 day  Stress Test Type:  Eugenie Birks  Reading MD: Ivor Messier, M.D. Order Authorizing Provider:  Kristeen Miss, MD  Resting Radionuclide: Technetium 5m Sestamibi  Resting Radionuclide Dose: 33.0 mCi on 01-29-13  Stress Radionuclide:  Technetium 28m Sestamibi  Stress Radionuclide Dose: 33.0 mCi on 01-24-13          Stress Protocol Rest HR: 63 Stress HR: 99  Rest BP: 112/77 Stress BP: 119/74  Exercise Time (min): n/a METS: n/a   Predicted Max HR: 171 bpm % Max HR: 57.89 bpm Rate Pressure Product: 52841   Dose of Adenosine (mg):  n/a Dose of Lexiscan: 0.4 mg  Dose of Atropine (mg): n/a Dose of Dobutamine: n/a mcg/kg/min (at max HR)  Stress Test Technologist: Frederick Peers, EMT-P  Nuclear Technologist:  Dario Guardian, CNMT     Rest Procedure:  Myocardial perfusion imaging was performed at rest 45 minutes following the intravenous administration of Technetium 52m Sestamibi. Rest ECG: NSR, RAD, inferior lateral TWI.  Stress Procedure:  The patient received IV Lexiscan 0.4 mg over 15-seconds.  Technetium 47m  Sestamibi injected at 30-seconds.  Quantitative spect images were obtained after a 45 minute delay. Stress ECG: No significant ST segment change suggestive of ischemia.  QPS Raw Data Images:  Mild breast attenuation.  Normal left ventricular size. Stress Images:  There is decreased uptake in the anterior wall. Rest Images:  There is decreased uptake in the anterior wall, slightly less prominent compared to the stress images. Subtraction (SDS):  There is a mildly reversible anterior defect that is most consistent with probable shifting breast attenuation. Transient Ischemic Dilatation (Normal <1.22):  1.07 Lung/Heart Ratio (Normal <0.45):  0.29  Quantitative Gated Spect Images QGS EDV:  107 ml QGS ESV:  46 ml  Impression Exercise Capacity:  Lexiscan with no exercise. BP Response:  Normal blood pressure response. Clinical Symptoms:  There is chest tightness and dyspnea. ECG Impression:  No significant ST segment change suggestive of ischemia. Comparison with Prior Nuclear Study: No images to compare  Overall Impression:  Low risk stress nuclear study with a small, mild, partially reversible defect most likely related to shifting breast attenuation.  LV Ejection Fraction: 57%.  LV Wall Motion:  NL LV Function; NL Wall Motion   Theresa [name]

## 2013-01-25 ENCOUNTER — Other Ambulatory Visit: Payer: Self-pay | Admitting: *Deleted

## 2013-01-25 DIAGNOSIS — R0789 Other chest pain: Secondary | ICD-10-CM

## 2013-01-25 DIAGNOSIS — I10 Essential (primary) hypertension: Secondary | ICD-10-CM

## 2013-01-25 DIAGNOSIS — E785 Hyperlipidemia, unspecified: Secondary | ICD-10-CM

## 2013-01-25 DIAGNOSIS — E876 Hypokalemia: Secondary | ICD-10-CM

## 2013-01-25 MED ORDER — POTASSIUM CHLORIDE CRYS ER 20 MEQ PO TBCR: 20.0000 meq | EXTENDED_RELEASE_TABLET | Freq: Two times a day (BID) | ORAL | Status: DC

## 2013-01-29 ENCOUNTER — Ambulatory Visit (HOSPITAL_COMMUNITY): Payer: Medicare Other | Attending: Cardiology

## 2013-01-29 MED ORDER — TECHNETIUM TC 99M SESTAMIBI GENERIC - CARDIOLITE
30.0000 | Freq: Once | INTRAVENOUS | Status: AC | PRN
  Administered 2013-01-29: 30 via INTRAVENOUS

## 2013-02-01 ENCOUNTER — Telehealth: Payer: Self-pay | Admitting: Family Medicine

## 2013-02-01 NOTE — Telephone Encounter (Signed)
Pt called and would like the results of her sleep study and the cardiologist reports. jw

## 2013-02-01 NOTE — Telephone Encounter (Signed)
Spoke to Pt about lexiscan results  Have not seen sleep study results Will check these when in office next and call pt  Shelly Flatten, MD Family Medicine PGY-3 02/01/2013, 4:00 PM

## 2013-02-02 DIAGNOSIS — G4733 Obstructive sleep apnea (adult) (pediatric): Secondary | ICD-10-CM

## 2013-02-03 NOTE — Procedures (Signed)
[name], [name]                ACCOUNT NO.:  [id number]  MEDICAL RECORD NO.:  [id number]          PATIENT TYPE:  OUT  LOCATION:  SLEEP CENTER                 FACILITY:  [facility]  PHYSICIAN:  Clinton D. Maple Hudson, MD, FCCP, FACPDATE OF BIRTH:  January 31[birth year] 1964  DATE OF STUDY:  01/20/2013                           NOCTURNAL POLYSOMNOGRAM  REFERRING PHYSICIAN:  Dessa Phi, MD  INDICATION FOR STUDY:  Hypersomnia with sleep apnea.  EPWORTH SLEEPINESS SCORE:  13/24.  BMI 39.3, weight 229 pounds, height 64 inches, neck 16.5 inches.  MEDICATIONS:  Charted for a view.  SLEEP ARCHITECTURE:  Total sleep time 344.5 minutes, with sleep efficiency 92%.  Stage I was 2.3%, stage II 77.2%, stage III 5.7%, REM 14.8% of total sleep time.  Sleep latency 21.5 minutes, REM latency 113 minutes.  Awake after sleep onset 8.5 minutes.  Arousal index 4.7. Bedtime medication:  Gabapentin.  RESPIRATORY DATA:  Apnea-hypopnea index (AHI) 13.1 per hour.  A total of 75 events was scored including 42 obstructive apneas, 1 mixed apnea, 32 hypopneas.  Events were more common while supine.  REM AHI 60 per hour. There were not enough early events to meet criteria for split protocol CPAP titration.  OXYGEN DATA:  Moderately loud snoring with oxygen desaturation to a nadir of 81% and mean oxygen saturation through the study of 95.1% on room air.  CARDIAC DATA:  Sinus rhythm with occasional PAC.  MOVEMENT-PARASOMNIA:  No significant movement disturbance.  No bathroom trips.  IMPRESSION-RECOMMENDATION: 1. Mild obstructive sleep apnea/hypopnea syndrome, AHI 13.1 per hour.     Events in all positions, but especially while supine.  Moderately     loud snoring with oxygen desaturation to a nadir of 81% and mean     oxygen saturation through the study of 95.1% on room air. 2. There were not enough early events to meet protocol requirements     for split protocol of CPAP titration on this study.  Consider     return for  dedicated CPAP titration if appropriate.    Clinton D. Maple Hudson, MD, [facility], FACP Diplomate, American Board of Sleep Medicine   CDY/MEDQ  D:  02/02/2013 09:25:28  T:  02/03/2013 01:56:40  Job:  [id number]

## 2013-02-05 ENCOUNTER — Telehealth: Payer: Self-pay | Admitting: Family Medicine

## 2013-02-05 DIAGNOSIS — G4733 Obstructive sleep apnea (adult) (pediatric): Secondary | ICD-10-CM

## 2013-02-05 NOTE — Telephone Encounter (Signed)
Called to discuss sleep study  Will order CPAP titration study through sleep lab General surgery consult for possible wt loss  Shelly Flatten, MD Family Medicine PGY-3 02/05/2013, 9:31 AM

## 2013-02-05 NOTE — Assessment & Plan Note (Signed)
BMI >41 Will refer to Gen Surgery for eval/consult for bariatric surgery.

## 2013-02-05 NOTE — Assessment & Plan Note (Signed)
Sleep study performed and showing   IMPRESSION-RECOMMENDATION:  1. Mild obstructive sleep apnea/hypopnea syndrome, AHI 13.1 per hour.  Events in all positions, but especially while supine. Moderately  loud snoring with oxygen desaturation to a nadir of 81% and mean  oxygen saturation through the study of 95.1% on room air.  2. There were not enough early events to meet protocol requirements  for split protocol of CPAP titration on this study. Consider  return for dedicated CPAP titration if appropriate.  Discussed results w/ pt who would prefer titration study to be performed Orders in for CPAP ttration study

## 2013-02-19 ENCOUNTER — Ambulatory Visit (HOSPITAL_BASED_OUTPATIENT_CLINIC_OR_DEPARTMENT_OTHER): Payer: Medicare Other

## 2013-02-19 VITALS — BP 137/88 | HR 71 | Temp 98.0°F

## 2013-02-19 DIAGNOSIS — C3490 Malignant neoplasm of unspecified part of unspecified bronchus or lung: Secondary | ICD-10-CM

## 2013-02-19 DIAGNOSIS — Z452 Encounter for adjustment and management of vascular access device: Secondary | ICD-10-CM

## 2013-02-19 DIAGNOSIS — C3410 Malignant neoplasm of upper lobe, unspecified bronchus or lung: Secondary | ICD-10-CM

## 2013-02-19 MED ORDER — SODIUM CHLORIDE 0.9 % IJ SOLN
10.0000 mL | INTRAMUSCULAR | Status: DC | PRN
  Administered 2013-02-19: 10 mL via INTRAVENOUS
  Filled 2013-02-19: qty 10

## 2013-02-19 MED ORDER — HEPARIN SOD (PORK) LOCK FLUSH 100 UNIT/ML IV SOLN
500.0000 [IU] | Freq: Once | INTRAVENOUS | Status: AC
  Administered 2013-02-19: 500 [IU] via INTRAVENOUS
  Filled 2013-02-19: qty 5

## 2013-02-19 NOTE — Patient Instructions (Signed)
Implanted Port Instructions  An implanted port is a central line that has a round shape and is placed under the skin. It is used for long-term IV (intravenous) access for:  · Medicine.  · Fluids.  · Liquid nutrition, such as TPN (total parenteral nutrition).  · Blood samples.  Ports can be placed:  · In the chest area just below the collarbone (this is the most common place.)  · In the arms.  · In the belly (abdomen) area.  · In the legs.  PARTS OF THE PORT  A port has 2 main parts:  · The reservoir. The reservoir is round, disc-shaped, and will be a small, raised area under your skin.  · The reservoir is the part where a needle is inserted (accessed) to either give medicines or to draw blood.  · The catheter. The catheter is a long, slender tube that extends from the reservoir. The catheter is placed into a large vein.  · Medicine that is inserted into the reservoir goes into the catheter and then into the vein.  INSERTION OF THE PORT  · The port is surgically placed in either an operating room or in a procedural area (interventional radiology).  · Medicine may be given to help you relax during the procedure.  · The skin where the port will be inserted is numbed (local anesthetic).  · 1 or 2 small cuts (incisions) will be made in the skin to insert the port.  · The port can be used after it has been inserted.  INCISION SITE CARE  · The incision site may have small adhesive strips on it. This helps keep the incision site closed. Sometimes, no adhesive strips are placed. Instead of adhesive strips, a special kind of surgical glue is used to keep the incision closed.  · If adhesive strips were placed on the incision sites, do not take them off. They will fall off on their own.  · The incision site may be sore for 1 to 2 days. Pain medicine can help.  · Do not get the incision site wet. Bathe or shower as directed by your caregiver.  · The incision site should heal in 5 to 7 days. A small scar may form after the  incision has healed.  ACCESSING THE PORT  Special steps must be taken to access the port:  · Before the port is accessed, a numbing cream can be placed on the skin. This helps numb the skin over the port site.  · A sterile technique is used to access the port.  · The port is accessed with a needle. Only "non-coring" port needles should be used to access the port. Once the port is accessed, a blood return should be checked. This helps ensure the port is in the vein and is not clogged (clotted).  · If your caregiver believes your port should remain accessed, a clear (transparent) bandage will be placed over the needle site. The bandage and needle will need to be changed every week or as directed by your caregiver.  · Keep the bandage covering the needle clean and dry. Do not get it wet. Follow your caregiver's instructions on how to take a shower or bath when the port is accessed.  · If your port does not need to stay accessed, no bandage is needed over the port.  FLUSHING THE PORT  Flushing the port keeps it from getting clogged. How often the port is flushed depends on:  · If a   constant infusion is running. If a constant infusion is running, the port may not need to be flushed.  · If intermittent medicines are given.  · If the port is not being used.  For intermittent medicines:  · The port will need to be flushed:  · After medicines have been given.  · After blood has been drawn.  · As part of routine maintenance.  · A port is normally flushed with:  · Normal saline.  · Heparin.  · Follow your caregiver's advice on how often, how much, and the type of flush to use on your port.  IMPORTANT PORT INFORMATION  · Tell your caregiver if you are allergic to heparin.  · After your port is placed, you will get a manufacturer's information card. The card has information about your port. Keep this card with you at all times.  · There are many types of ports available. Know what kind of port you have.  · In case of an  emergency, it may be helpful to wear a medical alert bracelet. This can help alert health care workers that you have a port.  · The port can stay in for as long as your caregiver believes it is necessary.  · When it is time for the port to come out, surgery will be done to remove it. The surgery will be similar to how the port was put in.  · If you are in the hospital or clinic:  · Your port will be taken care of and flushed by a nurse.  · If you are at home:  · A home health care nurse may give medicines and take care of the port.  · You or a family member can get special training and directions for giving medicine and taking care of the port at home.  SEEK IMMEDIATE MEDICAL CARE IF:   · Your port does not flush or you are unable to get a blood return.  · New drainage or pus is coming from the incision.  · A bad smell is coming from the incision site.  · You develop swelling or increased redness at the incision site.  · You develop increased swelling or pain at the port site.  · You develop swelling or pain in the surrounding skin near the port.  · You have an oral temperature above 102° F (38.9° C), not controlled by medicine.  MAKE SURE YOU:   · Understand these instructions.  · Will watch your condition.  · Will get help right away if you are not doing well or get worse.  Document Released: 02/28/2005 Document Revised: 05/23/2011 Document Reviewed: 05/22/2008  ExitCare® Patient Information ©2014 [employer].

## 2013-02-26 ENCOUNTER — Ambulatory Visit: Payer: Medicare Other | Admitting: Cardiovascular Disease

## 2013-02-27 ENCOUNTER — Ambulatory Visit (HOSPITAL_BASED_OUTPATIENT_CLINIC_OR_DEPARTMENT_OTHER): Payer: Medicare Other | Attending: Family Medicine | Admitting: Radiology

## 2013-02-27 VITALS — Ht 64.0 in | Wt 229.0 lb

## 2013-02-27 DIAGNOSIS — G4733 Obstructive sleep apnea (adult) (pediatric): Secondary | ICD-10-CM

## 2013-02-27 DIAGNOSIS — G4710 Hypersomnia, unspecified: Secondary | ICD-10-CM | POA: Insufficient documentation

## 2013-03-01 ENCOUNTER — Encounter: Payer: Self-pay | Admitting: Cardiovascular Disease

## 2013-03-02 DIAGNOSIS — G4733 Obstructive sleep apnea (adult) (pediatric): Secondary | ICD-10-CM

## 2013-03-02 NOTE — Sleep Study (Signed)
   NAME: Theresa [name] DATE OF BIRTH:  Apr 18, 1965 MEDICAL RECORD NUMBER [id number]  LOCATION: [facility]  PHYSICIAN: Jetty Duhamel D  DATE OF STUDY: 02/27/2013  SLEEP STUDY TYPE: Nocturnal Polysomnogram               REFERRING PHYSICIAN: Tobey Grim, MD  INDICATION FOR STUDY: Hypersomnia with sleep apnea. A baseline nocturnal polysomnogram on 01/20/2013 recorded AHI 13.1 per hour area body weight was 229 pounds. CPAP titration is requested.  EPWORTH SLEEPINESS SCORE:   13/24 HEIGHT: 5\' 4"  (162.6 cm)  WEIGHT: 229 lb (103.874 kg)    Body mass index is 39.29 kg/(m^2).  NECK SIZE: 16.5 in.  MEDICATIONS: Charted for review  SLEEP ARCHITECTURE: Total sleep time 397 minutes with sleep efficiency 87.5%. Stage I was 4.5%, stage II 66.6%, stage III 10.2%, REM 18.6% of total sleep time. Sleep latency 24.5 minutes, REM latency 120.5 minutes, awake after sleep onset 32 minutes, arousal index 9.1. Bedtime medication: Gabapentin  RESPIRATORY DATA: CPAP titration protocol. CPAP was titrated to 19 CWP, AHI 1.4 per hour. She wore a small [employer] Simplus mask with heated humidifier.  OXYGEN DATA: Snoring was prevented by CPAP and mean oxygen saturation help 97.3% on room air  CARDIAC DATA: Normal sinus rhythm  MOVEMENT/PARASOMNIA: No significant movement disturbance, no bathroom trips  IMPRESSION/ RECOMMENDATION:   1) Successful CPAP titration to 19 CWP, AHI 1.4 per hour. She wore a small [employer] Simplus mask with heated humidifier. Snoring was prevented and mean oxygen saturation of 97.3% on room air with CPAP. 2) A baseline nocturnal polysomnogram on 01/20/2013 recorded AHI 13.1 per hour. Body weight was 229 pounds.   Signed Jetty Duhamel M.D. Waymon Budge Diplomate, American Board of Sleep Medicine  ELECTRONICALLY SIGNED ON:  03/02/2013, 4:12 PM [facility] PH: [phone]   FX: [phone] ACCREDITED BY THE  AMERICAN ACADEMY OF SLEEP MEDICINE

## 2013-03-12 ENCOUNTER — Telehealth: Payer: Self-pay | Admitting: Family Medicine

## 2013-03-12 DIAGNOSIS — G4733 Obstructive sleep apnea (adult) (pediatric): Secondary | ICD-10-CM

## 2013-03-12 NOTE — Telephone Encounter (Signed)
Will forward to MD.  He will need to write a Rx with the titration levels and we will fax that and the results to [facility]. Fleeger, [name]

## 2013-03-12 NOTE — Telephone Encounter (Signed)
Pt called and needs information on when she will get the sleep Apnea mask or machine. She had the study done and now is awaiting the results on what is next. jw

## 2013-03-13 NOTE — Telephone Encounter (Signed)
Faxed to [facility]. Cara Aguino Dawn  

## 2013-03-18 ENCOUNTER — Other Ambulatory Visit: Payer: Self-pay | Admitting: Family Medicine

## 2013-03-18 MED ORDER — VALSARTAN-HYDROCHLOROTHIAZIDE 160-12.5 MG PO TABS: 1.0000 | ORAL_TABLET | Freq: Every morning | ORAL | Status: DC

## 2013-03-22 ENCOUNTER — Other Ambulatory Visit: Payer: Self-pay | Admitting: Family Medicine

## 2013-04-02 ENCOUNTER — Telehealth: Payer: Self-pay | Admitting: Internal Medicine

## 2013-04-02 NOTE — Telephone Encounter (Signed)
returned pt call and sw pt ..she needed to r/s mised flush...done...pt ok and aware

## 2013-04-09 ENCOUNTER — Ambulatory Visit (HOSPITAL_BASED_OUTPATIENT_CLINIC_OR_DEPARTMENT_OTHER): Payer: Medicare Other

## 2013-04-09 ENCOUNTER — Emergency Department (HOSPITAL_COMMUNITY): Payer: Medicare Other

## 2013-04-09 ENCOUNTER — Emergency Department (HOSPITAL_COMMUNITY)
Admission: EM | Admit: 2013-04-09 | Discharge: 2013-04-09 | Disposition: A | Discharge status: Home / Self Care | Payer: Medicare Other | Attending: Emergency Medicine | Admitting: Emergency Medicine

## 2013-04-09 ENCOUNTER — Encounter (HOSPITAL_COMMUNITY): Payer: Self-pay | Admitting: Emergency Medicine

## 2013-04-09 VITALS — BP 149/104 | Temp 98.4°F

## 2013-04-09 DIAGNOSIS — T43204A Poisoning by unspecified antidepressants, undetermined, initial encounter: Secondary | ICD-10-CM | POA: Insufficient documentation

## 2013-04-09 DIAGNOSIS — T43201A Poisoning by unspecified antidepressants, accidental (unintentional), initial encounter: Secondary | ICD-10-CM | POA: Insufficient documentation

## 2013-04-09 DIAGNOSIS — Z95828 Presence of other vascular implants and grafts: Secondary | ICD-10-CM

## 2013-04-09 DIAGNOSIS — Z8619 Personal history of other infectious and parasitic diseases: Secondary | ICD-10-CM | POA: Insufficient documentation

## 2013-04-09 DIAGNOSIS — G43909 Migraine, unspecified, not intractable, without status migrainosus: Secondary | ICD-10-CM | POA: Insufficient documentation

## 2013-04-09 DIAGNOSIS — Z452 Encounter for adjustment and management of vascular access device: Secondary | ICD-10-CM

## 2013-04-09 DIAGNOSIS — Y929 Unspecified place or not applicable: Secondary | ICD-10-CM | POA: Insufficient documentation

## 2013-04-09 DIAGNOSIS — Z85118 Personal history of other malignant neoplasm of bronchus and lung: Secondary | ICD-10-CM | POA: Insufficient documentation

## 2013-04-09 DIAGNOSIS — T50901A Poisoning by unspecified drugs, medicaments and biological substances, accidental (unintentional), initial encounter: Secondary | ICD-10-CM

## 2013-04-09 DIAGNOSIS — Y9389 Activity, other specified: Secondary | ICD-10-CM | POA: Insufficient documentation

## 2013-04-09 DIAGNOSIS — E119 Type 2 diabetes mellitus without complications: Secondary | ICD-10-CM | POA: Insufficient documentation

## 2013-04-09 DIAGNOSIS — E785 Hyperlipidemia, unspecified: Secondary | ICD-10-CM | POA: Insufficient documentation

## 2013-04-09 DIAGNOSIS — H409 Unspecified glaucoma: Secondary | ICD-10-CM | POA: Insufficient documentation

## 2013-04-09 DIAGNOSIS — Z79899 Other long term (current) drug therapy: Secondary | ICD-10-CM | POA: Insufficient documentation

## 2013-04-09 DIAGNOSIS — I10 Essential (primary) hypertension: Secondary | ICD-10-CM | POA: Insufficient documentation

## 2013-04-09 DIAGNOSIS — Z880 Allergy status to penicillin: Secondary | ICD-10-CM | POA: Insufficient documentation

## 2013-04-09 DIAGNOSIS — F17200 Nicotine dependence, unspecified, uncomplicated: Secondary | ICD-10-CM | POA: Insufficient documentation

## 2013-04-09 DIAGNOSIS — Z872 Personal history of diseases of the skin and subcutaneous tissue: Secondary | ICD-10-CM | POA: Insufficient documentation

## 2013-04-09 DIAGNOSIS — Z923 Personal history of irradiation: Secondary | ICD-10-CM | POA: Insufficient documentation

## 2013-04-09 DIAGNOSIS — C3410 Malignant neoplasm of upper lobe, unspecified bronchus or lung: Secondary | ICD-10-CM

## 2013-04-09 DIAGNOSIS — IMO0002 Reserved for concepts with insufficient information to code with codable children: Secondary | ICD-10-CM | POA: Insufficient documentation

## 2013-04-09 DIAGNOSIS — Z9221 Personal history of antineoplastic chemotherapy: Secondary | ICD-10-CM | POA: Insufficient documentation

## 2013-04-09 DIAGNOSIS — F411 Generalized anxiety disorder: Secondary | ICD-10-CM | POA: Insufficient documentation

## 2013-04-09 DIAGNOSIS — K047 Periapical abscess without sinus: Secondary | ICD-10-CM | POA: Insufficient documentation

## 2013-04-09 DIAGNOSIS — H548 Legal blindness, as defined in USA: Secondary | ICD-10-CM | POA: Insufficient documentation

## 2013-04-09 LAB — BASIC METABOLIC PANEL
BUN: 9 mg/dL (ref 6–23)
CO2: 24 mEq/L (ref 19–32)
Calcium: 10 mg/dL (ref 8.4–10.5)
Chloride: 94 mEq/L — ABNORMAL LOW (ref 96–112)
Creatinine, Ser: 1.31 mg/dL — ABNORMAL HIGH (ref 0.50–1.10)
GFR calc Af Amer: 54 mL/min — ABNORMAL LOW (ref >90)
GFR calc non Af Amer: 47 mL/min — ABNORMAL LOW (ref >90)
Glucose, Bld: 354 mg/dL — ABNORMAL HIGH (ref 70–99)
Potassium: 3.8 mEq/L (ref 3.7–5.3)
Sodium: 134 mEq/L — ABNORMAL LOW (ref 137–147)

## 2013-04-09 LAB — RAPID URINE DRUG SCREEN, HOSP PERFORMED
Amphetamines: NOT DETECTED
Barbiturates: NOT DETECTED
Benzodiazepines: NOT DETECTED
Cocaine: NOT DETECTED
Opiates: NOT DETECTED
Tetrahydrocannabinol: NOT DETECTED

## 2013-04-09 LAB — CBC
HCT: 50.5 % — ABNORMAL HIGH (ref 36.0–46.0)
Hemoglobin: 17.9 g/dL — ABNORMAL HIGH (ref 12.0–15.0)
MCH: 31.1 pg (ref 26.0–34.0)
MCHC: 35.4 g/dL (ref 30.0–36.0)
MCV: 87.8 fL (ref 78.0–100.0)
Platelets: 248 10*3/uL (ref 150–400)
RBC: 5.75 MIL/uL — ABNORMAL HIGH (ref 3.87–5.11)
RDW: 12.9 % (ref 11.5–15.5)
WBC: 9.4 10*3/uL (ref 4.0–10.5)

## 2013-04-09 LAB — URINE MICROSCOPIC-ADD ON

## 2013-04-09 LAB — URINALYSIS, ROUTINE W REFLEX MICROSCOPIC
Bilirubin Urine: NEGATIVE
Glucose, UA: >1000 mg/dL — AB
Ketones, ur: NEGATIVE mg/dL
Leukocytes, UA: NEGATIVE
Nitrite: NEGATIVE
Protein, ur: 100 mg/dL — AB
Specific Gravity, Urine: 1.02 (ref 1.005–1.030)
Urobilinogen, UA: 0.2 mg/dL (ref 0.0–1.0)
pH: 7 (ref 5.0–8.0)

## 2013-04-09 LAB — CG4 I-STAT (LACTIC ACID): Lactic Acid, Venous: 1.55 mmol/L (ref 0.5–2.2)

## 2013-04-09 MED ORDER — HEPARIN SOD (PORK) LOCK FLUSH 100 UNIT/ML IV SOLN
500.0000 [IU] | Freq: Once | INTRAVENOUS | Status: AC
  Administered 2013-04-09: 500 [IU] via INTRAVENOUS
  Filled 2013-04-09: qty 5

## 2013-04-09 MED ORDER — LORAZEPAM 2 MG/ML IJ SOLN
1.0000 mg | Freq: Once | INTRAMUSCULAR | Status: AC
  Administered 2013-04-09: 1 mg via INTRAVENOUS
  Filled 2013-04-09: qty 1

## 2013-04-09 MED ORDER — SODIUM CHLORIDE 0.9 % IJ SOLN
10.0000 mL | INTRAMUSCULAR | Status: DC | PRN
  Administered 2013-04-09: 10 mL via INTRAVENOUS
  Filled 2013-04-09: qty 10

## 2013-04-09 MED ORDER — LORAZEPAM 1 MG PO TABS: 1.0000 mg | ORAL_TABLET | Freq: Once | ORAL | Status: DC

## 2013-04-09 MED ORDER — ONDANSETRON HCL 4 MG/2ML IJ SOLN
4.0000 mg | Freq: Once | INTRAMUSCULAR | Status: AC
  Administered 2013-04-09: 4 mg via INTRAVENOUS
  Filled 2013-04-09: qty 2

## 2013-04-09 NOTE — ED Provider Notes (Signed)
CSN: [id number]     Arrival date & time 04/09/13  0012 History   First MD Initiated Contact with Patient 04/09/13 0035     Chief Complaint  Patient presents with  . Altered Mental Status  . Drug Overdose   (Consider location/radiation/quality/duration/timing/severity/associated sxs/prior Treatment) HPI Comments: Patient here with agitation per family. Patient seen recently for dental abscess. She is put on pain meds and antibiotics. There is concern that she mixed her Wellbutrin and her pain medicine. She has two of her meds in her wellbutrin bottle. Unknown amount of wellbutrin taken, unknown time she tooks these meds. This began over the past 6-8 hours and is worsening.   Patient is a 48 y.o. female presenting with altered mental status and Overdose. The history is provided by the patient.  Altered Mental Status Presenting symptoms: behavior changes   Severity:  Moderate Most recent episode:  Today Episode history:  Single Timing:  Constant Progression:  Worsening Chronicity:  New Context: drug use and recent change in medication   Context: not dementia, taking medications as prescribed and not a nursing home resident   Associated symptoms: agitation   Associated symptoms: no abdominal pain, no difficulty breathing, no fever, no nausea, no rash, no seizures, no vomiting and no weakness   Drug Overdose Pertinent negatives include no abdominal pain.    Past Medical History  Diagnosis Date  . Hypertension   . Hyperlipidemia   . Blindness, legal     glaucoma and retinitis  pigmentosa  . Glaucoma   . Onychomycosis   . Retinitis pigmentosa   . Chronic eczema   . Diabetes mellitus type II     diet controlled  . Fibromyalgia   . Anxiety   . Lung cancer     lung ca dx 2010  . Hx of radiation therapy 01-17-11 to 03-18-11    lung  . History of chemotherapy 01/2011 to 03/2011    concurrent w/radiation therapy  . Chronic headaches 10-17-11    migraines   Past Surgical History   Procedure Laterality Date  . Lung surgery  03/24/08    L vats, L thoracotomy and LUL trisegmentectomy with node dissection  . Chest tube insertion   04/01/08    L hydropneumothorax  . Back surgery    . Eye surgery      lens implant  . Portacath placement  02/11/2011    Procedure: INSERTION PORT-A-CATH;  Surgeon: Pierre Bali, MD;  Location: [facility];  Service: Thoracic;  Laterality: Right;  9.6Fr. Pre-attached Power Port in Right Internal Jugular  -right chest-remains inplace 10-17-11  . Knee arthroscopy  10-17-11    bil. knee scope(left was torn ligament)  . Lumbar laminectomy/decompression microdiscectomy  10/27/2011    Procedure: LUMBAR LAMINECTOMY/DECOMPRESSION MICRODISCECTOMY;  Surgeon: Johnn Hai, MD;  Location: [facility];  Service: Orthopedics;  Laterality: N/A;  L4-L5  . Abdominal hysterectomy  09/19/2001    hx. of fibroids. TAH. Ovaries remain.    Family History  Problem Relation Age of Onset  . Cancer Maternal Aunt     brain  . Depression Mother   . Heart disease Mother   . Depression Son   . Diabetes Maternal Grandmother   . Stroke Maternal Grandmother   . Depression Maternal Grandmother   . Depression Daughter    History  Substance Use Topics  . Smoking status: Current Every Day Smoker -- 0.50 packs/day for 30 years    Types: Cigarettes    Last Attempt to Quit:  11/13/2007  . Smokeless tobacco: Never Used     Comment: using chantix, hx 1 1/2 PPD(has decrease to 3 cigs per day)  . Alcohol Use: Yes     Comment: occasional   OB History   Grav Para Term Preterm Abortions TAB SAB Ect Mult Living                 Review of Systems  Constitutional: Negative for fever.  Gastrointestinal: Negative for nausea, vomiting and abdominal pain.  Skin: Negative for rash.  Neurological: Negative for seizures and weakness.  Psychiatric/Behavioral: Positive for agitation.  All other systems reviewed and are negative.    Allergies  Morphine and related; Codeine; and  Penicillins  Home Medications   Current Outpatient Rx  Name  Route  Sig  Dispense  Refill  . albuterol (PROVENTIL HFA;VENTOLIN HFA) 108 (90 BASE) MCG/ACT inhaler   Inhalation   Inhale 2 puffs into the lungs every 4 (four) hours as needed. For wheezing   8.5 g   6   . amLODipine (NORVASC) 10 MG tablet   Oral   Take 1 tablet (10 mg total) by mouth every morning.   90 tablet   3   . atorvastatin (LIPITOR) 40 MG tablet   Oral   Take 1 tablet (40 mg total) by mouth daily.   90 tablet   3   . buPROPion (WELLBUTRIN SR) 150 MG 12 hr tablet   Oral   Take 1 tablet (150 mg total) by mouth 2 (two) times daily.   60 tablet   1   . carvedilol (COREG) 25 MG tablet   Oral   Take 1 tablet (25 mg total) by mouth 2 (two) times daily with a meal.   180 tablet   3   . fluticasone (FLONASE) 50 MCG/ACT nasal spray   Nasal   Place 2 sprays into the nose daily.   16 g   6   . gabapentin (NEURONTIN) 300 MG capsule   Oral   Take 1 capsule (300 mg total) by mouth 3 (three) times daily.   270 capsule   3   . glipiZIDE (GLUCOTROL) 5 MG tablet   Oral   Take 1 tablet (5 mg total) by mouth 2 (two) times daily before a meal.   180 tablet   3   . HYDROcodone-acetaminophen (NORCO/VICODIN) 5-325 MG per tablet   Oral   Take 1 tablet by mouth every 6 (six) hours as needed for moderate pain.         Marland Kitchen omeprazole (PRILOSEC) 40 MG capsule   Oral   Take 1 capsule (40 mg total) by mouth daily.   90 capsule   3   . potassium chloride SA (K-DUR,KLOR-CON) 20 MEQ tablet   Oral   Take 20 mEq by mouth daily.         . valsartan-hydrochlorothiazide (DIOVAN-HCT) 160-12.5 MG per tablet   Oral   Take 1 tablet by mouth every morning.   90 tablet   4   . Eflornithine HCl 13.9 % cream   Topical   Apply 1 application topically 2 (two) times daily with a meal.   45 g   0   . TRIAMCINOLONE ACETONIDE, TOP, 0.05 % OINT   Apply externally   Apply 1 application topically 2 (two) times daily.    17 g   0    BP 135/84  Pulse 85  Temp(Src) 99.3 F (37.4 C) (Oral)  Resp 23  SpO2  98% Physical Exam  Nursing note and vitals reviewed. Constitutional: She is oriented to person, place, and time. She appears well-developed and well-nourished. No distress.  HENT:  Head: Normocephalic and atraumatic.  Eyes: EOM are normal. Pupils are equal, round, and reactive to light.  Neck: Normal range of motion. Neck supple.  Cardiovascular: Normal rate and regular rhythm.  Exam reveals no friction rub.   No murmur heard. Pulmonary/Chest: Effort normal and breath sounds normal. No respiratory distress. She has no wheezes. She has no rales.  Abdominal: Soft. She exhibits no distension. There is no tenderness. There is no rebound.  Musculoskeletal: Normal range of motion. She exhibits no edema.  Neurological: She is alert and oriented to person, place, and time. No cranial nerve deficit. She exhibits normal muscle tone.  Skin: She is not diaphoretic.  Psychiatric:  Agitated, talking like a baby, crying, easily upset.     ED Course  Procedures (including critical care time) Labs Review Labs Reviewed  CBC  BASIC METABOLIC PANEL  URINALYSIS, ROUTINE W REFLEX MICROSCOPIC  URINE RAPID DRUG SCREEN (HOSP PERFORMED)   Imaging Review Ct Head Wo Contrast  04/09/2013   CLINICAL DATA:  Altered mental status history of lung cancer  EXAM: CT HEAD WITHOUT CONTRAST  TECHNIQUE: Contiguous axial images were obtained from the base of the skull through the vertex without contrast.  COMPARISON:  08/18/2010  FINDINGS: Normal appearance of the intracranial structures. No evidence for acute hemorrhage, mass lesion, midline shift, hydrocephalus or large infarct. Cavum septum noted, a normal variant. No acute bony abnormality. The visualized sinuses are clear.  IMPRESSION: Stable exam.  No acute intracranial finding.   Electronically Signed   By: Daryll Brod M.D.   On: 04/09/2013 02:18    EKG Interpretation     Date/Time:  Tuesday April 09 2013 01:09:52 EST Ventricular Rate:  83 PR Interval:  165 QRS Duration: 97 QT Interval:  351 QTC Calculation: 412 R Axis:   84 Text Interpretation:  Sinus rhythm Borderline repolarization abnormality Minimal ST elevation, anterolateral leads Baseline wander in lead(s) II T wave changes improved from prior. Confirmed by Mingo Amber  MD, Cornish ([id number]) on 04/09/2013 7:52:23 AM            MDM   1. Accidental overdose    79F presents with agitation, altered mental status. Concern for accidental drug overdose. Recently put on pain meds, antibiotics. Had mixed meds in her wellbutrin bottle. Unknown amount taken, unknown what time taken. Patient here with tachycardia, no fever. She is alert to time and year, easily upset, crying, acting childish. No seizure like activity. Lungs clear, belly benign. Tachypneic. Will give Ativan, check labs, EKG.  Labs normal. Patient is doing much better, returned to baseline. Unclear why she was acting childish and unlike herself. She remembers all the events, she thought the wellbutrin bottle was a bottle of ibuprofen and took a few extra. She is noncompliant with her wellbutrin normally. She is roughly 10-12 hours out from her ingestion. I spoke with Poison Control who stated observation is not recommended as she didn't take a large amount. Stable for discharge.    Osvaldo Shipper, MD 04/09/13 [phone]

## 2013-04-09 NOTE — ED Notes (Signed)
Pt had Wellbutrin filled on 04/07/13, supposed to take 1 tablet twice a day, currently missing 16 pills. Pt also has a narcotic pain medication in bottle

## 2013-04-09 NOTE — ED Notes (Signed)
CBG 363

## 2013-04-09 NOTE — ED Notes (Signed)
Patient appears to be much better at this time. Her conversation is coherent. She is remembering what happen prior to her arrival to the emergency department.

## 2013-04-09 NOTE — Patient Instructions (Signed)

## 2013-04-09 NOTE — ED Notes (Addendum)
Pt supposedly took too much wellbutrin. States she took 3 yesterday and 3 today. Pt is legally blind. Possibly got all her meds confused. Pt is loud, shaking, cursing, confused, and laughing. Family states this is not normal for her. Pt also had dental surgery recently.

## 2013-04-09 NOTE — ED Notes (Signed)
Patient transported to CT 

## 2013-04-09 NOTE — Discharge Instructions (Signed)
Accidental Overdose °A drug overdose occurs when a chemical substance (drug or medication) is used in amounts large enough to overcome a person. This may result in severe illness or death. This is a type of poisoning. Accidental overdoses of medications or other substances come from a variety of reasons. When this happens accidentally, it is often because the person taking the substance does not know enough about what they have taken. Drugs which commonly cause overdose deaths are alcohol, psychotropic medications (medications which affect the mind), pain medications, illegal drugs (street drugs) such as cocaine and heroin, and multiple drugs taken at the same time. It may result from careless behavior (such as over-indulging at a party). Other causes of overdose may include multiple drug use, a lapse in memory, or drug use after a period of no drug use.  °Sometimes overdosing occurs because a person cannot remember if they have taken their medication.  °A common unintentional overdose in young children involves multi-vitamins containing iron. Iron is a part of the hemoglobin molecule in blood. It is used to transport oxygen to living cells. When taken in small amounts, iron allows the body to restock hemoglobin. In large amounts, it causes problems in the body. If this overdose is not treated, it can lead to death. °Never take medicines that show signs of tampering or do not seem quite right. Never take medicines in the dark or in poor lighting. Read the label and check each dose of medicine before you take it. When adults are poisoned, it happens most often through carelessness or lack of information. Taking medicines in the dark or taking medicine prescribed for someone else to treat the same type of problem is a dangerous practice. °SYMPTOMS  °Symptoms of overdose depend on the medication and amount taken. They can vary from over-activity with stimulant over-dosage, to sleepiness from depressants such as  alcohol, narcotics and tranquilizers. Confusion, dizziness, nausea and vomiting may be present. If problems are severe enough coma and death may result. °DIAGNOSIS  °Diagnosis and management are generally straightforward if the drug is known. Otherwise it is more difficult. At times, certain symptoms and signs exhibited by the patient, or blood tests, can reveal the drug in question.  °TREATMENT  °In an emergency department, most patients can be treated with supportive measures. Antidotes may be available if there has been an overdose of opioids or benzodiazepines. A rapid improvement will often occur if this is the cause of overdose. °At home or away from medical care: °· There may be no immediate problems or warning signs in children. °· Not everything works well in all cases of poisoning. °· Take immediate action. Poisons may act quickly. °· If you think someone has swallowed medicine or a household product, and the person is unconscious, having seizures (convulsions), or is not breathing, immediately call for an ambulance. °IF a person is conscious and appears to be doing OK but has swallowed a poison: °· Do not wait to see what effect the poison will have. Immediately call a poison control center (listed in the white pages of your telephone book under "Poison Control" or inside the front cover with other emergency numbers). Some poison control centers have TTY capability for the deaf. Check with your local center if you or someone in your family requires this service. °· Keep the container so you can read the label on the product for ingredients. °· Describe what, when, and how much was taken and the age and condition of the person poisoned.   Inform them if the person is vomiting, choking, drowsy, shows a change in color or temperature of skin, is conscious or unconscious, or is convulsing.  Do not cause vomiting unless instructed by medical personnel. Do not induce vomiting or force liquids into a person who  is convulsing, unconscious, or very drowsy. Stay calm and in control.   Activated charcoal also is sometimes used in certain types of poisoning and you may wish to add a supply to your emergency medicines. It is available without a prescription. Call a poison control center before using this medication. PREVENTION  Thousands of children die every year from unintentional poisoning. This may be from household chemicals, poisoning from carbon monoxide in a car, taking their parent's medications, or simply taking a few iron pills or vitamins with iron. Poisoning comes from unexpected sources.  Store medicines out of the sight and reach of children, preferably in a locked cabinet. Do not keep medications in a food cabinet. Always store your medicines in a secure place. Get rid of expired medications.  If you have children living with you or have them as occasional guests, you should have child-resistant caps on your medicine containers. Keep everything out of reach. Child proof your home.  If you are called to the telephone or to answer the door while you are taking a medicine, take the container with you or put the medicine out of the reach of small children.  Do not take your medication in front of children. Do not tell your child how good a medication is and how good it is for them. They may get the idea it is more of a treat.  If you are an adult and have accidentally taken an overdose, you need to consider how this happened and what can be done to prevent it from happening again. If this was from a street drug or alcohol, determine if there is a problem that needs addressing. If you are not sure a problems exists, it is easy to talk to a professional and ask them if they think you have a problem. It is better to handle this problem in this way before it happens again and has a much worse consequence. Document Released: 05/14/2004 Document Revised: 05/23/2011 Document Reviewed: 10/20/2008 [facility]  Patient Information 2014 [place].

## 2013-04-11 ENCOUNTER — Telehealth: Payer: Self-pay | Admitting: Family Medicine

## 2013-04-11 DIAGNOSIS — G4733 Obstructive sleep apnea (adult) (pediatric): Secondary | ICD-10-CM

## 2013-04-11 NOTE — Telephone Encounter (Signed)
Patient states that the pressure on her CPAP machine is alittle too high and in order for [facility] to come out and decrease pressure, Dr. Marily Memos will need to fax a RX to [facility] for orders to decrease pressure in CPAP. Please call patient once completed.

## 2013-04-11 NOTE — Telephone Encounter (Signed)
Spoke with Santiago Glad @ [facility], in order to change the settings, they need an order with the exact titration/setting that you want it changed to.  Will forward to MD. Clinton Sawyer, Salome Spotted

## 2013-04-15 NOTE — Telephone Encounter (Signed)
Cant be verbal, must be written. Theresa [name], Theresa [name]

## 2013-04-15 NOTE — Telephone Encounter (Signed)
Will forward to MD. Jazmin Hartsell,CMA  

## 2013-04-15 NOTE — Telephone Encounter (Signed)
Please call Santiago Glad at [facility] and give the following instructions based on sleep study w/ CPAP titration from December.  Optimal CPAP pressure: 19cm H2o w/ heated humidity Titration Count: CPAP pressure started at 5cm H2o and titrated up to 19cm H2o w/ heated humidity and was well tolerated O2: none required.   Please also call pt when this is done  thanks

## 2013-04-15 NOTE — Telephone Encounter (Signed)
Pt is calling to check the status of her request for a change of settings on her CPAP machine. jw

## 2013-04-16 NOTE — Telephone Encounter (Signed)
MD placed orders in chart, faxed to [facility]. Theresa [name], Theresa [name]

## 2013-04-30 ENCOUNTER — Other Ambulatory Visit: Payer: Medicare Other

## 2013-04-30 ENCOUNTER — Ambulatory Visit (HOSPITAL_COMMUNITY)
Admission: RE | Admit: 2013-04-30 | Discharge: 2013-04-30 | Disposition: A | Discharge status: Home / Self Care | Payer: Medicare Other | Source: Ambulatory Visit | Attending: Internal Medicine | Admitting: Internal Medicine

## 2013-04-30 ENCOUNTER — Other Ambulatory Visit: Payer: Self-pay | Admitting: *Deleted

## 2013-04-30 DIAGNOSIS — C3490 Malignant neoplasm of unspecified part of unspecified bronchus or lung: Secondary | ICD-10-CM

## 2013-05-01 ENCOUNTER — Other Ambulatory Visit (HOSPITAL_BASED_OUTPATIENT_CLINIC_OR_DEPARTMENT_OTHER): Payer: Medicare Other

## 2013-05-01 ENCOUNTER — Ambulatory Visit (HOSPITAL_COMMUNITY)
Admission: RE | Admit: 2013-05-01 | Discharge: 2013-05-01 | Disposition: A | Discharge status: Home / Self Care | Payer: Medicare Other | Source: Ambulatory Visit | Attending: Internal Medicine | Admitting: Internal Medicine

## 2013-05-01 DIAGNOSIS — Z923 Personal history of irradiation: Secondary | ICD-10-CM | POA: Insufficient documentation

## 2013-05-01 DIAGNOSIS — Z902 Acquired absence of lung [part of]: Secondary | ICD-10-CM | POA: Insufficient documentation

## 2013-05-01 DIAGNOSIS — C3490 Malignant neoplasm of unspecified part of unspecified bronchus or lung: Secondary | ICD-10-CM

## 2013-05-01 DIAGNOSIS — C3410 Malignant neoplasm of upper lobe, unspecified bronchus or lung: Secondary | ICD-10-CM

## 2013-05-01 DIAGNOSIS — Z85118 Personal history of other malignant neoplasm of bronchus and lung: Secondary | ICD-10-CM | POA: Insufficient documentation

## 2013-05-01 LAB — COMPREHENSIVE METABOLIC PANEL (CC13)
ALT: 18 U/L (ref 0–55)
AST: 12 U/L (ref 5–34)
Albumin: 4.2 g/dL (ref 3.5–5.0)
Alkaline Phosphatase: 76 U/L (ref 40–150)
Anion Gap: 10 mEq/L (ref 3–11)
BUN: 10 mg/dL (ref 7.0–26.0)
CO2: 24 mEq/L (ref 22–29)
Calcium: 9.5 mg/dL (ref 8.4–10.4)
Chloride: 103 mEq/L (ref 98–109)
Creatinine: 1.5 mg/dL — ABNORMAL HIGH (ref 0.6–1.1)
Glucose: 337 mg/dl — ABNORMAL HIGH (ref 70–140)
Potassium: 3.8 mEq/L (ref 3.5–5.1)
Sodium: 137 mEq/L (ref 136–145)
Total Bilirubin: 0.75 mg/dL (ref 0.20–1.20)
Total Protein: 7.1 g/dL (ref 6.4–8.3)

## 2013-05-01 LAB — CBC WITH DIFFERENTIAL/PLATELET
BASO%: 0.2 % (ref 0.0–2.0)
Basophils Absolute: 0 10*3/uL (ref 0.0–0.1)
EOS%: 5.4 % (ref 0.0–7.0)
Eosinophils Absolute: 0.3 10*3/uL (ref 0.0–0.5)
HCT: 48 % — ABNORMAL HIGH (ref 34.8–46.6)
HGB: 16.5 g/dL — ABNORMAL HIGH (ref 11.6–15.9)
LYMPH%: 19.4 % (ref 14.0–49.7)
MCH: 30.2 pg (ref 25.1–34.0)
MCHC: 34.4 g/dL (ref 31.5–36.0)
MCV: 87.9 fL (ref 79.5–101.0)
MONO#: 0.5 10*3/uL (ref 0.1–0.9)
MONO%: 7.4 % (ref 0.0–14.0)
NEUT#: 4.1 10*3/uL (ref 1.5–6.5)
NEUT%: 67.6 % (ref 38.4–76.8)
Platelets: 192 10*3/uL (ref 145–400)
RBC: 5.46 10*6/uL — ABNORMAL HIGH (ref 3.70–5.45)
RDW: 13.1 % (ref 11.2–14.5)
WBC: 6.1 10*3/uL (ref 3.9–10.3)
lymph#: 1.2 10*3/uL (ref 0.9–3.3)
nRBC: 0 % (ref 0–0)

## 2013-05-02 ENCOUNTER — Telehealth: Payer: Self-pay | Admitting: Internal Medicine

## 2013-05-02 ENCOUNTER — Ambulatory Visit: Payer: Medicare Other | Admitting: Internal Medicine

## 2013-05-02 ENCOUNTER — Other Ambulatory Visit: Payer: Self-pay | Admitting: *Deleted

## 2013-05-02 NOTE — Progress Notes (Signed)
[facility] for f/u appt with [facility].  Onc tx schedule filled out.  SLJ

## 2013-05-02 NOTE — Telephone Encounter (Signed)
s.w. pt and advised on new d.t. for appt...pt ok adn aware

## 2013-05-03 ENCOUNTER — Other Ambulatory Visit: Payer: Self-pay | Admitting: Family Medicine

## 2013-05-03 ENCOUNTER — Ambulatory Visit (INDEPENDENT_AMBULATORY_CARE_PROVIDER_SITE_OTHER): Payer: Medicare Other | Admitting: Family Medicine

## 2013-05-03 ENCOUNTER — Encounter: Payer: Self-pay | Admitting: Family Medicine

## 2013-05-03 VITALS — BP 122/82 | HR 72 | Temp 98.7°F | Wt 224.0 lb

## 2013-05-03 DIAGNOSIS — M549 Dorsalgia, unspecified: Secondary | ICD-10-CM

## 2013-05-03 DIAGNOSIS — E119 Type 2 diabetes mellitus without complications: Secondary | ICD-10-CM

## 2013-05-03 DIAGNOSIS — F339 Major depressive disorder, recurrent, unspecified: Secondary | ICD-10-CM

## 2013-05-03 DIAGNOSIS — I10 Essential (primary) hypertension: Secondary | ICD-10-CM

## 2013-05-03 LAB — POCT GLYCOSYLATED HEMOGLOBIN (HGB A1C): Hemoglobin A1C: 11.5

## 2013-05-03 MED ORDER — POTASSIUM CHLORIDE CRYS ER 20 MEQ PO TBCR: 20.0000 meq | EXTENDED_RELEASE_TABLET | Freq: Every day | ORAL | Status: DC

## 2013-05-03 MED ORDER — GLIPIZIDE 10 MG PO TABS: 10.0000 mg | ORAL_TABLET | Freq: Two times a day (BID) | ORAL | Status: DC

## 2013-05-03 NOTE — Assessment & Plan Note (Signed)
Well controlled.  DC wellbutrin

## 2013-05-03 NOTE — Assessment & Plan Note (Signed)
Deferring traetment options to ortho Pt to obtain second opinion regarding surgery

## 2013-05-03 NOTE — Assessment & Plan Note (Signed)
No change. At goal

## 2013-05-03 NOTE — Addendum Note (Signed)
Addended by: Marily Memos, Aliene Tamura J on: 05/03/2013 11:10 AM   Modules accepted: Orders

## 2013-05-03 NOTE — Assessment & Plan Note (Signed)
Significant inc in A1c (11.5) Previously well controlled on glipizide 5 BID only Metformin not an option as Cr around 1.5 Inc glipizide to 10mg  BID and pt to focus on diet and exercise If elevated again will consider starting injectables.

## 2013-05-03 NOTE — Progress Notes (Signed)
Theresa [name] is a 48 y.o. female who presents to [facility] today for DM and back pain  DM: AM glucose around 200. Glipizide 5mg  BID only. Increased dietary intake over the past several months. No s/s of hyper/hypoglycemia  Back pain: same as chronic back pain. Followed by Ortho (Dr Tonita Cong) - put on meloxicam. Recommending back stimulator. Pt trying to get second opinion.   Depression: Off Wellbutrin. No increase in depression/anxiety. Pt experienced accidental OD on 1/27 secondary to mistaking medication for motrin (pt legally blind). Pt has since thrown out medication.    Smoker: smokes when anxious.   HTN: taking meds as prescribed. NO CP, Palp  The following portions of the patient's history were reviewed and updated as appropriate: allergies, current medications, past medical history, family and social history, and problem list.  Patient is a smoker.   Past Medical History  Diagnosis Date  . Hypertension   . Hyperlipidemia   . Blindness, legal     glaucoma and retinitis  pigmentosa  . Glaucoma   . Onychomycosis   . Retinitis pigmentosa   . Chronic eczema   . Diabetes mellitus type II     diet controlled  . Fibromyalgia   . Anxiety   . Lung cancer     lung ca dx 2010  . Hx of radiation therapy 01-17-11 to 03-18-11    lung  . History of chemotherapy 01/2011 to 03/2011    concurrent w/radiation therapy  . Chronic headaches 10-17-11    migraines    ROS as above otherwise neg.    Medications reviewed. Current Outpatient Prescriptions  Medication Sig Dispense Refill  . albuterol (PROVENTIL HFA;VENTOLIN HFA) 108 (90 BASE) MCG/ACT inhaler Inhale 2 puffs into the lungs every 4 (four) hours as needed. For wheezing  8.5 g  6  . amLODipine (NORVASC) 10 MG tablet Take 1 tablet (10 mg total) by mouth every morning.  90 tablet  3  . atorvastatin (LIPITOR) 40 MG tablet Take 1 tablet (40 mg total) by mouth daily.  90 tablet  3  . carvedilol (COREG) 25 MG tablet Take 1 tablet (25 mg total) by  mouth 2 (two) times daily with a meal.  180 tablet  3  . Eflornithine HCl 13.9 % cream Apply 1 application topically 2 (two) times daily with a meal.  45 g  0  . fluticasone (FLONASE) 50 MCG/ACT nasal spray Place 2 sprays into the nose daily.  16 g  6  . gabapentin (NEURONTIN) 300 MG capsule Take 1 capsule (300 mg total) by mouth 3 (three) times daily.  270 capsule  3  . glipiZIDE (GLUCOTROL) 10 MG tablet Take 1 tablet (10 mg total) by mouth 2 (two) times daily before a meal.  180 tablet  3  . HYDROcodone-acetaminophen (NORCO/VICODIN) 5-325 MG per tablet Take 1 tablet by mouth every 6 (six) hours as needed for moderate pain.      Marland Kitchen omeprazole (PRILOSEC) 40 MG capsule Take 1 capsule (40 mg total) by mouth daily.  90 capsule  3  . potassium chloride SA (K-DUR,KLOR-CON) 20 MEQ tablet Take 20 mEq by mouth daily.      . TRIAMCINOLONE ACETONIDE, TOP, 0.05 % OINT Apply 1 application topically 2 (two) times daily.  17 g  0  . valsartan-hydrochlorothiazide (DIOVAN-HCT) 160-12.5 MG per tablet Take 1 tablet by mouth every morning.  90 tablet  4   No current facility-administered medications for this visit.    Exam:  BP 122/82  Pulse 72  Temp(Src) 98.7 F (37.1 C) (Oral)  Wt 224 lb (101.606 kg) Gen: Well NAD HEENT: EOMI,  MMM   Results for orders placed in visit on 05/03/13 (from the past 72 hour(s))  POCT GLYCOSYLATED HEMOGLOBIN (HGB A1C)     Status: Abnormal   Collection Time    05/03/13  9:00 AM      Result Value Ref Range   Hemoglobin A1C 11.5      A/P (as seen in Problem list)  DIABETES-TYPE 2 Significant inc in A1c (11.5) Previously well controlled on glipizide 5 BID only Metformin not an option as Cr around 1.5 Inc glipizide to 10mg  BID and pt to focus on diet and exercise If elevated again will consider starting injectables.    Back pain Deferring traetment options to ortho Pt to obtain second opinion regarding surgery   DEPRESSION, MAJOR, RECURRENT Well controlled.  DC  wellbutrin   HYPERTENSION No change. At goal

## 2013-05-03 NOTE — Patient Instructions (Signed)
You are doing well overall but your diabetes is getting a little out of control Please start the increased dose of the glipizide adn focus on diet and exercise Please come back in 3 months for a diabetes check. If your numbers are still high we will need to start you on insulin Please follow up with the orthopedic surgeons about your back Please come back in the next 1-2 months to discuss the hair on your face and smoking Please  Continue all of your other medications as previously prescribed   Diabetes Meal Planning Guide The diabetes meal planning guide is a tool to help you plan your meals and snacks. It is important for people with diabetes to manage their blood glucose (sugar) levels. Choosing the right foods and the right amounts throughout your day will help control your blood glucose. Eating right can even help you improve your blood pressure and reach or maintain a healthy weight. CARBOHYDRATE COUNTING MADE EASY When you eat carbohydrates, they turn to sugar. This raises your blood glucose level. Counting carbohydrates can help you control this level so you feel better. When you plan your meals by counting carbohydrates, you can have more flexibility in what you eat and balance your medicine with your food intake. Carbohydrate counting simply means adding up the total amount of carbohydrate grams in your meals and snacks. Try to eat about the same amount at each meal. Foods with carbohydrates are listed below. Each portion below is 1 carbohydrate serving or 15 grams of carbohydrates. Ask your dietician how many grams of carbohydrates you should eat at each meal or snack. Grains and Starches  1 slice bread.   English muffin or hotdog/hamburger bun.   cup cold cereal (unsweetened).   cup cooked pasta or rice.   cup starchy vegetables (corn, potatoes, peas, beans, winter squash).  1 tortilla (6 inches).   bagel.  1 waffle or pancake (size of a CD).   cup cooked cereal.  4  to 6 small crackers. *Whole grain is recommended. Fruit  1 cup fresh unsweetened berries, melon, papaya, pineapple.  1 small fresh fruit.   banana or mango.   cup fruit juice (4 oz unsweetened).   cup canned fruit in natural juice or water.  2 tbs dried fruit.  12 to 15 grapes or cherries. Milk and Yogurt  1 cup fat-free or 1% milk.  1 cup soy milk.  6 oz light yogurt with sugar-free sweetener.  6 oz low-fat soy yogurt.  6 oz plain yogurt. Vegetables  1 cup raw or  cup cooked is counted as 0 carbohydrates or a "free" food.  If you eat 3 or more servings at 1 meal, count them as 1 carbohydrate serving. Other Carbohydrates   oz chips or pretzels.   cup ice cream or frozen yogurt.   cup sherbet or sorbet.  2 inch square cake, no frosting.  1 tbs honey, sugar, jam, jelly, or syrup.  2 small cookies.  3 squares of graham crackers.  3 cups popcorn.  6 crackers.  1 cup broth-based soup.  Count 1 cup casserole or other mixed foods as 2 carbohydrate servings.  Foods with less than 20 calories in a serving may be counted as 0 carbohydrates or a "free" food. You may want to purchase a book or computer software that lists the carbohydrate gram counts of different foods. In addition, the nutrition facts panel on the labels of the foods you eat are a good source of this information.  The label will tell you how big the serving size is and the total number of carbohydrate grams you will be eating per serving. Divide this number by 15 to obtain the number of carbohydrate servings in a portion. Remember, 1 carbohydrate serving equals 15 grams of carbohydrate. SERVING SIZES Measuring foods and serving sizes helps you make sure you are getting the right amount of food. The list below tells how big or small some common serving sizes are.  1 oz.........4 stacked dice.  3 oz........Marland KitchenDeck of cards.  1 tsp.......Marland KitchenTip of little finger.  1 tbs......Marland KitchenMarland KitchenThumb.  2  tbs.......Marland KitchenGolf ball.   cup......Marland KitchenHalf of a fist.  1 cup.......Marland KitchenA fist. SAMPLE DIABETES MEAL PLAN Below is a sample meal plan that includes foods from the grain and starches, dairy, vegetable, fruit, and meat groups. A dietician can individualize a meal plan to fit your calorie needs and tell you the number of servings needed from each food group. However, controlling the total amount of carbohydrates in your meal or snack is more important than making sure you include all of the food groups at every meal. You may interchange carbohydrate containing foods (dairy, starches, and fruits). The meal plan below is an example of a 2000 calorie diet using carbohydrate counting. This meal plan has 17 carbohydrate servings. Breakfast  1 cup oatmeal (2 carb servings).   cup light yogurt (1 carb serving).  1 cup blueberries (1 carb serving).   cup almonds. Snack  1 large apple (2 carb servings).  1 low-fat string cheese stick. Lunch  Chicken breast salad.  1 cup spinach.   cup chopped tomatoes.  2 oz chicken breast, sliced.  2 tbs low-fat [place] dressing.  12 whole-wheat crackers (2 carb servings).  12 to 15 grapes (1 carb serving).  1 cup low-fat milk (1 carb serving). Snack  1 cup carrots.   cup hummus (1 carb serving). Dinner  3 oz broiled salmon.  1 cup brown rice (3 carb servings). Snack  1  cups steamed broccoli (1 carb serving) drizzled with 1 tsp olive oil and lemon juice.  1 cup light pudding (2 carb servings). DIABETES MEAL PLANNING WORKSHEET Your dietician can use this worksheet to help you decide how many servings of foods and what types of foods are right for you.  BREAKFAST Food Group and Servings / Carb Servings Grain/Starches __________________________________ Dairy __________________________________________ Vegetable ______________________________________ Fruit ___________________________________________ Meat  __________________________________________ Fat ____________________________________________ LUNCH Food Group and Servings / Carb Servings Grain/Starches ___________________________________ Dairy ___________________________________________ Fruit ____________________________________________ Meat ___________________________________________ Fat _____________________________________________ Wonda [employer] Food Group and Servings / Carb Servings Grain/Starches ___________________________________ Dairy ___________________________________________ Fruit ____________________________________________ Meat ___________________________________________ Fat _____________________________________________ SNACKS Food Group and Servings / Carb Servings Grain/Starches ___________________________________ Dairy ___________________________________________ Vegetable _______________________________________ Fruit ____________________________________________ Meat ___________________________________________ Fat _____________________________________________ DAILY TOTALS Starches _________________________ Vegetable ________________________ Fruit ____________________________ Dairy ____________________________ Meat ____________________________ Fat ______________________________ Document Released: 11/25/2004 Document Revised: 05/23/2011 Document Reviewed: 10/06/2008 [employer] Patient Information 2014 [employer]

## 2013-05-07 ENCOUNTER — Ambulatory Visit: Payer: Medicare Other | Admitting: Internal Medicine

## 2013-05-07 ENCOUNTER — Telehealth: Payer: Self-pay | Admitting: Internal Medicine

## 2013-05-07 NOTE — Telephone Encounter (Signed)
pt called to r/s due to scat bus running late....done r/s md and flush to 3.4 per pt request

## 2013-05-15 ENCOUNTER — Ambulatory Visit: Payer: Medicare Other

## 2013-05-15 ENCOUNTER — Telehealth: Payer: Self-pay | Admitting: *Deleted

## 2013-05-15 ENCOUNTER — Encounter: Payer: Self-pay | Admitting: Internal Medicine

## 2013-05-15 ENCOUNTER — Ambulatory Visit (HOSPITAL_BASED_OUTPATIENT_CLINIC_OR_DEPARTMENT_OTHER): Payer: Medicare Other | Admitting: Internal Medicine

## 2013-05-15 VITALS — BP 141/80 | HR 74 | Temp 99.3°F | Resp 18 | Ht 64.0 in | Wt 222.7 lb

## 2013-05-15 DIAGNOSIS — Z95828 Presence of other vascular implants and grafts: Secondary | ICD-10-CM

## 2013-05-15 DIAGNOSIS — F17200 Nicotine dependence, unspecified, uncomplicated: Secondary | ICD-10-CM

## 2013-05-15 DIAGNOSIS — C3490 Malignant neoplasm of unspecified part of unspecified bronchus or lung: Secondary | ICD-10-CM

## 2013-05-15 DIAGNOSIS — C3410 Malignant neoplasm of upper lobe, unspecified bronchus or lung: Secondary | ICD-10-CM

## 2013-05-15 MED ORDER — SODIUM CHLORIDE 0.9 % IJ SOLN
10.0000 mL | INTRAMUSCULAR | Status: DC | PRN
  Administered 2013-05-15: 10 mL via INTRAVENOUS
  Filled 2013-05-15: qty 10

## 2013-05-15 MED ORDER — HEPARIN SOD (PORK) LOCK FLUSH 100 UNIT/ML IV SOLN
500.0000 [IU] | Freq: Once | INTRAVENOUS | Status: AC
  Administered 2013-05-15: 500 [IU] via INTRAVENOUS
  Filled 2013-05-15: qty 5

## 2013-05-15 NOTE — Telephone Encounter (Signed)
appts made and printed. Pt is aware that cs will call w/ appt for CT Chest...td

## 2013-05-15 NOTE — Progress Notes (Signed)
[facility] [phone]   [phone]  OFFICE PROGRESS NOTE  MERRELL, DAVID, MD [address] [place] [address]  DIAGNOSIS: Recurrent non-small cell lung cancer initially diagnosed as stage IA (T1b, N0, MX) adenocarcinoma in November 2009.   PRIOR THERAPY:  #1 Status post left upper lobe trisegmentectomy with lymph node dissection under the care of Dr. Arlyce Dice on March 24, 2009.  #2 Concurrent chemoradiation with carboplatin for AUC of 2 and paclitaxel 45 mg/M2 given weekly with radiation.   CURRENT THERAPY: Observation.  CHEMOTHERAPY INTENT: Curative  CURRENT # OF CHEMOTHERAPY CYCLES: 0  CURRENT ANTIEMETICS: None  CURRENT SMOKING STATUS: Current smoker and I strongly encouraged her to quit smoking and altered her to smoke cessation program.  ORAL CHEMOTHERAPY AND CONSENT: None  CURRENT BISPHOSPHONATES USE: None  PAIN MANAGEMENT: 0/10  NARCOTICS INDUCED CONSTIPATION: None  LIVING WILL AND CODE STATUS: No CODE BLUE   INTERVAL HISTORY: Theresa [name] 48 y.o. female returns to the clinic today for six-month followup visit. The patient is feeling fine today with no specific complaints except for recent sinus infection and she is seeing her primary care physician tomorrow for evaluation. She has no chest pain, shortness of breath, cough or hemoptysis. She denied having any significant fever or chills, no nausea or vomiting. No significant weight loss or night sweats. The patient denied having any fatigue or weakness. She had repeat CT scan of the chest performed recently and she is here for evaluation and discussion of her scan results.  MEDICAL HISTORY: Past Medical History  Diagnosis Date  . Hypertension   . Hyperlipidemia   . Blindness, legal     glaucoma and retinitis  pigmentosa  . Glaucoma   . Onychomycosis   . Retinitis pigmentosa   . Chronic eczema   . Diabetes mellitus type II     diet controlled  . Fibromyalgia     . Anxiety   . Lung cancer     lung ca dx 2010  . Hx of radiation therapy 01-17-11 to 03-18-11    lung  . History of chemotherapy 01/2011 to 03/2011    concurrent w/radiation therapy  . Chronic headaches 10-17-11    migraines    ALLERGIES:  is allergic to morphine and related; codeine; and penicillins.  MEDICATIONS:  Current Outpatient Prescriptions  Medication Sig Dispense Refill  . albuterol (PROVENTIL HFA;VENTOLIN HFA) 108 (90 BASE) MCG/ACT inhaler Inhale 2 puffs into the lungs every 4 (four) hours as needed. For wheezing  8.5 g  6  . amLODipine (NORVASC) 10 MG tablet Take 1 tablet (10 mg total) by mouth every morning.  90 tablet  3  . atorvastatin (LIPITOR) 40 MG tablet Take 1 tablet (40 mg total) by mouth daily.  90 tablet  3  . carvedilol (COREG) 25 MG tablet Take 1 tablet (25 mg total) by mouth 2 (two) times daily with a meal.  180 tablet  3  . Eflornithine HCl 13.9 % cream Apply 1 application topically 2 (two) times daily with a meal.  45 g  0  . fluticasone (FLONASE) 50 MCG/ACT nasal spray Place 2 sprays into the nose daily.  16 g  6  . gabapentin (NEURONTIN) 300 MG capsule Take 1 capsule (300 mg total) by mouth 3 (three) times daily.  270 capsule  3  . glipiZIDE (GLUCOTROL) 10 MG tablet Take 1 tablet (10 mg total) by mouth 2 (two) times daily before a meal.  180 tablet  3  . HYDROcodone-acetaminophen (NORCO/VICODIN) 5-325 MG per tablet Take 1 tablet by mouth every 6 (six) hours as needed for moderate pain.      Marland Kitchen omeprazole (PRILOSEC) 40 MG capsule Take 1 capsule (40 mg total) by mouth daily.  90 capsule  3  . potassium chloride SA (K-DUR,KLOR-CON) 20 MEQ tablet Take 1 tablet (20 mEq total) by mouth daily.  90 tablet  3  . TRIAMCINOLONE ACETONIDE, TOP, 0.05 % OINT Apply 1 application topically 2 (two) times daily.  17 g  0  . valsartan-hydrochlorothiazide (DIOVAN-HCT) 160-12.5 MG per tablet Take 1 tablet by mouth every morning.  90 tablet  4   No current facility-administered  medications for this visit.    SURGICAL HISTORY:  Past Surgical History  Procedure Laterality Date  . Lung surgery  03/24/08    L vats, L thoracotomy and LUL trisegmentectomy with node dissection  . Chest tube insertion   04/01/08    L hydropneumothorax  . Back surgery    . Eye surgery      lens implant  . Portacath placement  02/11/2011    Procedure: INSERTION PORT-A-CATH;  Surgeon: Pierre Bali, MD;  Location: [facility];  Service: Thoracic;  Laterality: Right;  9.6Fr. Pre-attached Power Port in Right Internal Jugular  -right chest-remains inplace 10-17-11  . Knee arthroscopy  10-17-11    bil. knee scope(left was torn ligament)  . Lumbar laminectomy/decompression microdiscectomy  10/27/2011    Procedure: LUMBAR LAMINECTOMY/DECOMPRESSION MICRODISCECTOMY;  Surgeon: Johnn Hai, MD;  Location: [facility];  Service: Orthopedics;  Laterality: N/A;  L4-L5  . Abdominal hysterectomy  09/19/2001    hx. of fibroids. TAH. Ovaries remain.     REVIEW OF SYSTEMS:  A comprehensive review of systems was negative except for: Ears, nose, mouth, throat, and face: positive for nasal congestion   PHYSICAL EXAMINATION: General appearance: alert, cooperative and no distress Head: Normocephalic, without obvious abnormality, atraumatic Neck: no adenopathy Lymph nodes: Cervical, supraclavicular, and axillary nodes normal. Resp: clear to auscultation bilaterally Cardio: regular rate and rhythm, S1, S2 normal, no murmur, click, rub or gallop GI: soft, non-tender; bowel sounds normal; no masses,  no organomegaly Extremities: extremities normal, atraumatic, no cyanosis or edema  ECOG PERFORMANCE STATUS: 1 - Symptomatic but completely ambulatory  Blood pressure 141/80, pulse 74, temperature 99.3 F (37.4 C), temperature source Oral, resp. rate 18, height 5\' 4"  (1.626 m), weight 222 lb 11.2 oz (101.016 kg), SpO2 98.00%.  LABORATORY DATA: Lab Results  Component Value Date   WBC 6.1 05/01/2013   HGB 16.5* 05/01/2013     HCT 48.0* 05/01/2013   MCV 87.9 05/01/2013   PLT 192 05/01/2013      Chemistry      Component Value Date/Time   NA 137 05/01/2013 1155   NA 134* 04/09/2013 0115   NA 141 07/14/2011 0934   K 3.8 05/01/2013 1155   K 3.8 04/09/2013 0115   K 3.4 07/14/2011 0934   CL 94* 04/09/2013 0115   CL 102 01/18/2012 0805   CL 100 07/14/2011 0934   CO2 24 05/01/2013 1155   CO2 24 04/09/2013 0115   CO2 30 07/14/2011 0934   BUN 10.0 05/01/2013 1155   BUN 9 04/09/2013 0115   BUN 13 07/14/2011 0934   CREATININE 1.5* 05/01/2013 1155   CREATININE 1.31* 04/09/2013 0115   CREATININE 1.45* 12/21/2012 0909      Component Value Date/Time   CALCIUM 9.5 05/01/2013 1155   CALCIUM  10.0 04/09/2013 0115   CALCIUM 8.7 07/14/2011 0934   ALKPHOS 76 05/01/2013 1155   ALKPHOS 62 10/25/2012 0953   ALKPHOS 38 07/14/2011 0934   AST 12 05/01/2013 1155   AST 10 10/25/2012 0953   AST 19 07/14/2011 0934   ALT 18 05/01/2013 1155   ALT 17 10/25/2012 0953   ALT 20 07/14/2011 0934   BILITOT 0.75 05/01/2013 1155   BILITOT 1.2 10/25/2012 0953   BILITOT 1.20 07/14/2011 0934       RADIOGRAPHIC STUDIES: Ct Chest Wo Contrast  05/01/2013   CLINICAL DATA:  Followup lung cancer  EXAM: CT CHEST WITHOUT CONTRAST  TECHNIQUE: Multidetector CT imaging of the chest was performed following the standard protocol without IV contrast.  COMPARISON:  10/29/2012  FINDINGS: No pleural effusion identified. Postoperative change from left upper lobectomy identified. There is fibrosis and consolidation within left apex compatible with changes from external beam radiation. No suspicious nodule or mass identified within the lungs.  The heart size is normal. There is no pericardial effusion. No enlarged mediastinal lymph nodes identified. There is no axillary or supraclavicular adenopathy.  Incidental imaging through the upper abdomen is unremarkable. There are no aggressive lytic or sclerotic bone lesions identified.  Review of the visualized bony structures is significant for mild  spondylosis. No aggressive lytic or sclerotic bone lesions identified.  IMPRESSION: 1. No acute findings. 2. No specific features identified to suggest locally recurrent or metastatic disease.   Electronically Signed   By: Kerby Moors M.D.   On: 05/01/2013 13:12    ASSESSMENT AND PLAN: This is a very pleasant 48 years old [place] American female with recurrent non-small cell lung cancer status post concurrent chemoradiation and currently on observation with no evidence for disease progression. I discussed the scan results with the patient and her husband. I recommended for her to continue on observation with repeat CT scan of the chest in 6 months. I strongly encouraged the patient to quit smoking and offered her smoke cessation program. She was advised to call immediately if she has any concerning symptoms in the interval. The patient voices understanding of current disease status and treatment options and is in agreement with the current care plan.  All questions were answered. The patient knows to call the clinic with any problems, questions or concerns. We can certainly see the patient much sooner if necessary.  Disclaimer: This note was dictated with voice recognition software. Similar sounding words can inadvertently be transcribed and may not be corrected upon review.

## 2013-05-16 ENCOUNTER — Encounter: Payer: Self-pay | Admitting: Family Medicine

## 2013-05-16 ENCOUNTER — Ambulatory Visit: Payer: Medicare Other | Admitting: Home Health Services

## 2013-05-16 ENCOUNTER — Ambulatory Visit (INDEPENDENT_AMBULATORY_CARE_PROVIDER_SITE_OTHER): Payer: Medicare Other | Admitting: Family Medicine

## 2013-05-16 VITALS — BP 138/90 | HR 57 | Temp 98.4°F | Wt 220.0 lb

## 2013-05-16 DIAGNOSIS — E119 Type 2 diabetes mellitus without complications: Secondary | ICD-10-CM

## 2013-05-16 DIAGNOSIS — I10 Essential (primary) hypertension: Secondary | ICD-10-CM

## 2013-05-16 DIAGNOSIS — L680 Hirsutism: Secondary | ICD-10-CM

## 2013-05-16 DIAGNOSIS — J329 Chronic sinusitis, unspecified: Secondary | ICD-10-CM | POA: Insufficient documentation

## 2013-05-16 MED ORDER — AZITHROMYCIN 250 MG PO TABS: ORAL_TABLET | ORAL | Status: DC

## 2013-05-16 MED ORDER — VALSARTAN-HYDROCHLOROTHIAZIDE 160-25 MG PO TABS: 1.0000 | ORAL_TABLET | Freq: Every day | ORAL | Status: DC

## 2013-05-16 NOTE — Assessment & Plan Note (Signed)
Pt continues to Vaniqu and spironolactone w/o much relief Will referr to derm for further treatment

## 2013-05-16 NOTE — Progress Notes (Signed)
Theresa [name] is a 48 y.o. female who presents to [facility] today for SD appt for cold symptoms  Cold: Presents with a 8 day history of sinus congestion, rhinorrhea, and cough.  Her husband had a cold last week, and she thinks that she picked it up from him.  She has significant sinus pressure and tenderness of both the maxillary and frontal sinuses.  This has caused significant congestion, and she has noticed some increased swelling on her face.  Her rhinorrhea is predominantly out of the L nostril and is clear, non-purulent.  The cough is occasionally productive with clear/yellow phlegm.   Finally, she endorses L ear pain of the same duration.  The pain feels like it is inside her head and her outer ear is not tender.  She denies fever, sore throat, hearing loss, drainage from the ear.  Hirsutism:  Patient has a long standing increased hair growth on face.  Over the last 3 months, started to get worse.  Also developed a rash on the malar aspects of her cheeks that is pruritic and consists of 1-2 mm red papules.    Attributed this to her constant plucking of ingrown hairs on her face.  She has been on Vaniqu and Spironolactone, which have only provided modest benefit.       T2DM:  Her sugars have been high at home, in the range of 250-300, despite adherence to a low card, low fat diet.  Denies hypoglycemic episodes.  The patient was worried about her high sugar levels and inability to keep them down, despite a good diet.   The following portions of the patient's history were reviewed and updated as appropriate: allergies, current medications, past medical history, family and social history, and problem list.  Patient is a smoker   Past Medical History  Diagnosis Date  . Hypertension   . Hyperlipidemia   . Blindness, legal     glaucoma and retinitis  pigmentosa  . Glaucoma   . Onychomycosis   . Retinitis pigmentosa   . Chronic eczema   . Diabetes mellitus type II     diet controlled  .  Fibromyalgia   . Anxiety   . Lung cancer     lung ca dx 2010  . Hx of radiation therapy 01-17-11 to 03-18-11    lung  . History of chemotherapy 01/2011 to 03/2011    concurrent w/radiation therapy  . Chronic headaches 10-17-11    migraines    ROS as above otherwise neg.    Medications reviewed. Current Outpatient Prescriptions  Medication Sig Dispense Refill  . albuterol (PROVENTIL HFA;VENTOLIN HFA) 108 (90 BASE) MCG/ACT inhaler Inhale 2 puffs into the lungs every 4 (four) hours as needed. For wheezing  8.5 g  6  . amLODipine (NORVASC) 10 MG tablet Take 1 tablet (10 mg total) by mouth every morning.  90 tablet  3  . atorvastatin (LIPITOR) 40 MG tablet Take 1 tablet (40 mg total) by mouth daily.  90 tablet  3  . azithromycin (ZITHROMAX) 250 MG tablet Take 2 tablet immediately then 1 tablet daily  6 tablet  0  . carvedilol (COREG) 25 MG tablet Take 1 tablet (25 mg total) by mouth 2 (two) times daily with a meal.  180 tablet  3  . Eflornithine HCl 13.9 % cream Apply 1 application topically 2 (two) times daily with a meal.  45 g  0  . fluticasone (FLONASE) 50 MCG/ACT nasal spray Place 2 sprays into the nose  daily.  16 g  6  . gabapentin (NEURONTIN) 300 MG capsule Take 1 capsule (300 mg total) by mouth 3 (three) times daily.  270 capsule  3  . glipiZIDE (GLUCOTROL) 10 MG tablet Take 1 tablet (10 mg total) by mouth 2 (two) times daily before a meal.  180 tablet  3  . HYDROcodone-acetaminophen (NORCO/VICODIN) 5-325 MG per tablet Take 1 tablet by mouth every 6 (six) hours as needed for moderate pain.      Marland Kitchen omeprazole (PRILOSEC) 40 MG capsule Take 1 capsule (40 mg total) by mouth daily.  90 capsule  3  . potassium chloride SA (K-DUR,KLOR-CON) 20 MEQ tablet Take 1 tablet (20 mEq total) by mouth daily.  90 tablet  3  . TRIAMCINOLONE ACETONIDE, TOP, 0.05 % OINT Apply 1 application topically 2 (two) times daily.  17 g  0  . valsartan-hydrochlorothiazide (DIOVAN-HCT) 160-25 MG per tablet Take 1 tablet by  mouth daily.  90 tablet  3   No current facility-administered medications for this visit.    Exam:  BP 138/90  Pulse 57  Temp(Src) 98.4 F (36.9 C) (Oral)  Wt 220 lb (99.791 kg) Gen: Well NAD HEENT: erythema and swelling of both nasal turbinates L>R, normal, non-erythematous and non-bulging TM bilaterally, no throat erythema or tonsillar exudate, maxillary and frontal sinuses ttp. MMM Lungs: CTABL Nl WOB Heart: RRR no MRG Abd: NABS, NT, ND Exts: Non edematous BL  LE, warm and well perfused.   No results found for this or any previous visit (from the past 72 hour(s)).  A/P (as seen in Problem list)  Hirsutism Pt continues to Vaniqu and spironolactone w/o much relief Will referr to derm for further treatment   DIABETES-TYPE 2 Elevation likely realted to recent illness Recheck A1c in May and decide on injectibles at that time Of note pt is down 10lbs since October  Sinus infection Due to worsening symptoms including acutely painful sinuses will treat w/ Azithro at this time (PCN allergy) Start atrovent nasal spray Mucinex  HYPERTENSION Above goal at several visits  Will increase HCTZ to 25mg  daily     Note co-written w/ MS4 Rick w/ adjustments made as necessary

## 2013-05-16 NOTE — Assessment & Plan Note (Signed)
Due to worsening symptoms including acutely painful sinuses will treat w/ Azithro at this time (PCN allergy) Start atrovent nasal spray Mucinex

## 2013-05-16 NOTE — Assessment & Plan Note (Signed)
Above goal at several visits  Will increase HCTZ to 25mg  daily

## 2013-05-16 NOTE — Assessment & Plan Note (Signed)
Elevation likely realted to recent illness Recheck A1c in May and decide on injectibles at that time Of note pt is down 10lbs since October

## 2013-05-17 ENCOUNTER — Telehealth: Payer: Self-pay | Admitting: *Deleted

## 2013-05-17 NOTE — Telephone Encounter (Signed)
Pt states that her nasal spray was never called in yesterday.  Can you please send rx for the atrovent you mentioned to her if you still want her to use it. Jazmin Hartsell,CMA

## 2013-05-20 MED ORDER — IPRATROPIUM BROMIDE 0.06 % NA SOLN: 2.0000 | Freq: Four times a day (QID) | NASAL | Status: DC

## 2013-05-20 NOTE — Telephone Encounter (Signed)
Atrovent sent

## 2013-06-07 ENCOUNTER — Other Ambulatory Visit: Payer: Self-pay | Admitting: Neurological Surgery

## 2013-06-12 HISTORY — PX: BACK SURGERY: SHX140

## 2013-06-17 ENCOUNTER — Telehealth: Payer: Self-pay | Admitting: Internal Medicine

## 2013-06-17 NOTE — Telephone Encounter (Signed)
s.w. pt and confirmed Sept appt...amield pt appt sched adn letter

## 2013-06-18 ENCOUNTER — Encounter (HOSPITAL_COMMUNITY): Payer: Self-pay | Admitting: Pharmacy Technician

## 2013-06-24 NOTE — Pre-Procedure Instructions (Addendum)
Theresa [name]  06/24/2013   Your procedure is scheduled on:  Wednesday, April 22nd  Report to Admitting at 0930 AM.  Call this number if you have problems the morning of surgery: [phone]   Remember:   Do not eat food or drink liquids after midnight.   Take these medicines the morning of surgery with A SIP OF WATER: norvasc, coreg, prilosec, eye drops, nasal spray, neurontin       STOP all herbel meds, nsaids (aleve,naproxen,advil,ibuprofen) 5 days prior to surgery including vitamins,aspirin            NO DIABETIC MEDS AM OF SURGERY   Do not wear jewelry, make-up or nail polish.  Do not wear lotions, powders, or perfumes. You may wear deodorant.  Do not shave 48 hours prior to surgery. Men may shave face and neck.  Do not bring valuables to the hospital.  [facility] is not responsible for any belongings or valuables.               Contacts, dentures or bridgework may not be worn into surgery.  Leave suitcase in the car. After surgery it may be brought to your room.  For patients admitted to the hospital, discharge time is determined by your treatment team.  Please read over the following fact sheets that you were given: Pain Booklet, Coughing and Deep Breathing, Blood Transfusion Information, MRSA Information and Surgical Site Infection Prevention   - Preparing for Surgery  Before surgery, you can play an important role.  Because skin is not sterile, your skin needs to be as free of germs as possible.  You can reduce the number of germs on you skin by washing with CHG (chlorahexidine gluconate) soap before surgery.  CHG is an antiseptic cleaner which kills germs and bonds with the skin to continue killing germs even after washing.  Please DO NOT use if you have an allergy to CHG or antibacterial soaps.  If your skin becomes reddened/irritated stop using the CHG and inform your nurse when you arrive at Short Stay.  Do not shave (including legs and underarms) for at least  48 hours prior to the first CHG shower.  You may shave your face.  Please follow these instructions carefully:   1.  Shower with CHG Soap the night before surgery and the morning of Surgery.  2.  If you choose to wash your hair, wash your hair first as usual with your normal shampoo.  3.  After you shampoo, rinse your hair and body thoroughly to remove the shampoo.  4.  Use CHG as you would any other liquid soap.  You can apply CHG directly to the skin and wash gently with scrungie or a clean washcloth.  5.  Apply the CHG Soap to your body ONLY FROM THE NECK DOWN.  Do not use on open wounds or open sores.  Avoid contact with your eyes, ears, mouth and genitals (private parts).  Wash genitals (private parts) with your normal soap.  6.  Wash thoroughly, paying special attention to the area where your surgery will be performed.  7.  Thoroughly rinse your body with warm water from the neck down.  8.  DO NOT shower/wash with your normal soap after using and rinsing off the CHG Soap.  9.  Pat yourself dry with a clean towel.            10.  Wear clean pajamas.  11.  Place clean sheets on your bed the night of your first shower and do not sleep with pets.  Day of Surgery  Do not apply any lotions/deoderants the morning of surgery.  Please wear clean clothes to the hospital/surgery center.

## 2013-06-25 ENCOUNTER — Encounter (HOSPITAL_COMMUNITY): Payer: Self-pay

## 2013-06-25 ENCOUNTER — Encounter (HOSPITAL_COMMUNITY)
Admission: RE | Admit: 2013-06-25 | Discharge: 2013-06-25 | Disposition: A | Discharge status: Home / Self Care | Payer: Medicare Other | Source: Ambulatory Visit | Attending: Neurological Surgery | Admitting: Neurological Surgery

## 2013-06-25 ENCOUNTER — Ambulatory Visit (HOSPITAL_COMMUNITY)
Admission: RE | Admit: 2013-06-25 | Discharge: 2013-06-25 | Disposition: A | Discharge status: Home / Self Care | Payer: Medicare Other | Source: Ambulatory Visit | Attending: Neurological Surgery | Admitting: Neurological Surgery

## 2013-06-25 DIAGNOSIS — Z01818 Encounter for other preprocedural examination: Secondary | ICD-10-CM | POA: Insufficient documentation

## 2013-06-25 HISTORY — DX: Sleep apnea, unspecified: G47.30

## 2013-06-25 HISTORY — DX: Family history of other specified conditions: Z84.89

## 2013-06-25 HISTORY — DX: Unspecified osteoarthritis, unspecified site: M19.90

## 2013-06-25 HISTORY — DX: Gastro-esophageal reflux disease without esophagitis: K21.9

## 2013-06-25 LAB — TYPE AND SCREEN
ABO/RH(D): O POS
Antibody Screen: NEGATIVE

## 2013-06-25 LAB — CBC WITH DIFFERENTIAL/PLATELET
Basophils Absolute: 0 10*3/uL (ref 0.0–0.1)
Basophils Relative: 0 % (ref 0–1)
Eosinophils Absolute: 0.4 10*3/uL (ref 0.0–0.7)
Eosinophils Relative: 4 % (ref 0–5)
HCT: 50.2 % — ABNORMAL HIGH (ref 36.0–46.0)
Hemoglobin: 17.2 g/dL — ABNORMAL HIGH (ref 12.0–15.0)
Lymphocytes Relative: 16 % (ref 12–46)
Lymphs Abs: 1.6 10*3/uL (ref 0.7–4.0)
MCH: 31.5 pg (ref 26.0–34.0)
MCHC: 34.3 g/dL (ref 30.0–36.0)
MCV: 91.9 fL (ref 78.0–100.0)
Monocytes Absolute: 0.8 10*3/uL (ref 0.1–1.0)
Monocytes Relative: 8 % (ref 3–12)
Neutro Abs: 7 10*3/uL (ref 1.7–7.7)
Neutrophils Relative %: 72 % (ref 43–77)
Platelets: 211 10*3/uL (ref 150–400)
RBC: 5.46 MIL/uL — ABNORMAL HIGH (ref 3.87–5.11)
RDW: 13.7 % (ref 11.5–15.5)
WBC: 9.8 10*3/uL (ref 4.0–10.5)

## 2013-06-25 LAB — BASIC METABOLIC PANEL
BUN: 14 mg/dL (ref 6–23)
CO2: 26 mEq/L (ref 19–32)
Calcium: 9.4 mg/dL (ref 8.4–10.5)
Chloride: 100 mEq/L (ref 96–112)
Creatinine, Ser: 1.37 mg/dL — ABNORMAL HIGH (ref 0.50–1.10)
GFR calc Af Amer: 51 mL/min — ABNORMAL LOW (ref >90)
GFR calc non Af Amer: 44 mL/min — ABNORMAL LOW (ref >90)
Glucose, Bld: 161 mg/dL — ABNORMAL HIGH (ref 70–99)
Potassium: 3.9 mEq/L (ref 3.7–5.3)
Sodium: 141 mEq/L (ref 137–147)

## 2013-06-25 LAB — PROTIME-INR
INR: 1 (ref 0.00–1.49)
Prothrombin Time: 13 seconds (ref 11.6–15.2)

## 2013-06-25 LAB — SURGICAL PCR SCREEN
MRSA, PCR: NEGATIVE
Staphylococcus aureus: NEGATIVE

## 2013-06-25 NOTE — Progress Notes (Signed)
Anesthesia PAT Evaluation:  Patient is a 48 year old female scheduled for one level MAS, PLIF on 07/03/13 by Dr. Ronnald Ramp. Case is currently posted for 11:30 AM, but is the first case posted in Room 32.  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 BMI is 41 consistent with morbid obesity. For anesthesia history, she reports that she is difficult for PIV access and that her son has a history of MALIGNANT HYPERTHERMIA.  She denies personal history of MH.  Her son's father is now deceased, but she is unaware if he had a history of MH. She was evaluated by cardiologist Dr. Acie Fredrickson on 01/02/13 for palpitations (in the setting of hypokalemia) and abnormal EKG. HEM-ONC is Dr. Julien Nordmann. PCP is with [facility].  In regards to her son's reported history of MH, she says that he was having a tonsillar abscess drained in the OR and was told his fever was very high.  He required ice packs and was taken to the ICU intubated.  He remained in the ICU for a few days but ultimately had no long-term adverse effects.  He or none of his family members have ever undergone any formal testing.  Event happened approximately 15 years ago, and at the time they were told that if he required additional surgery that he would need to go somewhere like [facility].  EKG on 04/09/13 showed SR, borderline repolarization abnormality, minimal ST elevation anterolateral leads, baseline wanderer, particularly in inferior leads. Inferolateral T wave abnormality is less apparent when compared to 01/02/13 EKG.  Nuclear stress test on 01/24/13 showed:  Overall Impression: Low risk stress nuclear study with a small, mild, partially reversible defect most likely related to shifting breast attenuation. LV Ejection Fraction: 57%. LV Wall Motion: NL LV Function; NL Wall Motion.   CXR on 06/25/13 showed no acute cardiopulmonary disease.  Chest CT on 05/01/13 showed no acute findings, no specific features identified to suggest locally recurrent or metastatic disease.  Preoperative labs noted.  Glucose is 161. Her A1C on 05/03/13 was elevated at 11.5, but it was done during an acute URI, so her PCP wanted to re-evaluate in May before adjusting her DM regimen.  Patient reports that her glucose readings are much better now, ~ 130 fasting in AM and up to 150 in the PM.    On exam, she is pleasant black female in NAD.  She is obese.  Heart RRR, no murmur noted. Lungs clear. Trace BLE edema.  Good mouth opening.  I reviewed above anesthesia history with anesthesiologist Dr. Conrad [place]. No MH precautions used on 10/27/11; however, recommend preparing room/equipment if needed so her assigned anesthesiologist can consider trigger free induction if felt indicated.  I have notified Lelon Perla, CRNA.  Of note, she has been intermittently compliant with her CPAP. She feels the pressure is too high. I encouraged her to contact the prescribing physician. Also we discussed that RT could be consulted to see her during her hospitalization (defer to her anesthesiologist and/or surgeon).  George Hugh [facility] Short Stay Center/Anesthesiology Phone [phone] 06/25/2013 2:58 PM

## 2013-06-26 ENCOUNTER — Telehealth: Payer: Self-pay | Admitting: Family Medicine

## 2013-06-26 NOTE — Telephone Encounter (Signed)
Need to have provider send a rx to [facility] to have the air pressure level reduced on her CPAP machine.  Need to have this changed before her surgery coming up on April 22nd which is next Wednesday.  Surgery will take place at [facility].

## 2013-06-28 ENCOUNTER — Telehealth: Payer: Self-pay | Admitting: Internal Medicine

## 2013-06-28 NOTE — Telephone Encounter (Signed)
pt called to sched flush....done.Marland KitchenMarland Kitchenpt will pick up new sched on Monday

## 2013-07-02 ENCOUNTER — Telehealth: Payer: Self-pay | Admitting: Family Medicine

## 2013-07-02 ENCOUNTER — Other Ambulatory Visit: Payer: Self-pay | Admitting: Family Medicine

## 2013-07-02 ENCOUNTER — Ambulatory Visit (HOSPITAL_BASED_OUTPATIENT_CLINIC_OR_DEPARTMENT_OTHER): Payer: Medicare Other

## 2013-07-02 VITALS — BP 131/75 | HR 78 | Temp 98.0°F

## 2013-07-02 DIAGNOSIS — Z95828 Presence of other vascular implants and grafts: Secondary | ICD-10-CM

## 2013-07-02 DIAGNOSIS — Z452 Encounter for adjustment and management of vascular access device: Secondary | ICD-10-CM

## 2013-07-02 DIAGNOSIS — C3410 Malignant neoplasm of upper lobe, unspecified bronchus or lung: Secondary | ICD-10-CM

## 2013-07-02 MED ORDER — DEXAMETHASONE SODIUM PHOSPHATE 10 MG/ML IJ SOLN
10.0000 mg | INTRAMUSCULAR | Status: DC
  Filled 2013-07-02: qty 1

## 2013-07-02 MED ORDER — SODIUM CHLORIDE 0.9 % IV SOLN
1500.0000 mg | INTRAVENOUS | Status: AC
  Administered 2013-07-03: 1500 mg via INTRAVENOUS
  Filled 2013-07-02: qty 1500

## 2013-07-02 MED ORDER — HEPARIN SOD (PORK) LOCK FLUSH 100 UNIT/ML IV SOLN
500.0000 [IU] | Freq: Once | INTRAVENOUS | Status: AC
  Administered 2013-07-02: 500 [IU] via INTRAVENOUS
  Filled 2013-07-02: qty 5

## 2013-07-02 MED ORDER — SODIUM CHLORIDE 0.9 % IJ SOLN
10.0000 mL | INTRAMUSCULAR | Status: DC | PRN
  Administered 2013-07-02: 10 mL via INTRAVENOUS
  Filled 2013-07-02: qty 10

## 2013-07-02 NOTE — Patient Instructions (Signed)

## 2013-07-02 NOTE — Telephone Encounter (Signed)
Called and spoke pt  Sated that she was told that she needs an Rx in order to change CPAP settings Pt states that she's not able to autotitrate (not sure why) She did not have the nubmer for [facility] Can you all call them and give them a verbal to titrate the CPAP as needed. Pt did not have the number for [place]

## 2013-07-02 NOTE — Telephone Encounter (Signed)
Pt called and wanted Dr. Marily Memos and needs her CPAP called in to [place] and also a refill on her Chantix called in. jw

## 2013-07-02 NOTE — Telephone Encounter (Signed)
Rx written and given to support staff for fax

## 2013-07-02 NOTE — Telephone Encounter (Signed)
Spoke with [facility] and they will need a written order for titration faxed to their office.  Pt will need to bring her machine to them.  Please write order to say "reduce cpap pressure to 15cm H20 for patient comfort".   Will call pt and let her know she needs to take machine to local office. Jazmin Hartsell,CMA

## 2013-07-02 NOTE — Progress Notes (Signed)
Patient notified to arrive at 0730

## 2013-07-02 NOTE — Telephone Encounter (Signed)
Cpap is being taken care of but needs refill on her Chantix.  Uchechukwu Dhawan,CMA

## 2013-07-02 NOTE — Telephone Encounter (Signed)
The CPAP needs to be faxed to [phone]. jw

## 2013-07-03 ENCOUNTER — Encounter (HOSPITAL_COMMUNITY)
Admission: RE | Disposition: A | Discharge status: Home / Self Care | Payer: Self-pay | Source: Ambulatory Visit | Attending: Neurological Surgery

## 2013-07-03 ENCOUNTER — Inpatient Hospital Stay (HOSPITAL_COMMUNITY): Payer: Medicare Other

## 2013-07-03 ENCOUNTER — Inpatient Hospital Stay (HOSPITAL_COMMUNITY): Payer: Medicare Other | Admitting: Anesthesiology

## 2013-07-03 ENCOUNTER — Encounter (HOSPITAL_COMMUNITY): Payer: Medicare Other | Admitting: Vascular Surgery

## 2013-07-03 ENCOUNTER — Encounter (HOSPITAL_COMMUNITY): Payer: Self-pay | Admitting: *Deleted

## 2013-07-03 ENCOUNTER — Inpatient Hospital Stay (HOSPITAL_COMMUNITY)
Admission: RE | Admit: 2013-07-03 | Discharge: 2013-07-05 | DRG: 460 | Disposition: A | Discharge status: Home / Self Care | Payer: Medicare Other | Source: Ambulatory Visit | Attending: Neurological Surgery | Admitting: Neurological Surgery

## 2013-07-03 DIAGNOSIS — E119 Type 2 diabetes mellitus without complications: Secondary | ICD-10-CM | POA: Diagnosis present

## 2013-07-03 DIAGNOSIS — M47817 Spondylosis without myelopathy or radiculopathy, lumbosacral region: Secondary | ICD-10-CM | POA: Diagnosis present

## 2013-07-03 DIAGNOSIS — H3552 Pigmentary retinal dystrophy: Secondary | ICD-10-CM | POA: Diagnosis present

## 2013-07-03 DIAGNOSIS — G4730 Sleep apnea, unspecified: Secondary | ICD-10-CM | POA: Diagnosis present

## 2013-07-03 DIAGNOSIS — J45909 Unspecified asthma, uncomplicated: Secondary | ICD-10-CM | POA: Diagnosis present

## 2013-07-03 DIAGNOSIS — IMO0001 Reserved for inherently not codable concepts without codable children: Secondary | ICD-10-CM | POA: Diagnosis present

## 2013-07-03 DIAGNOSIS — M129 Arthropathy, unspecified: Secondary | ICD-10-CM | POA: Diagnosis present

## 2013-07-03 DIAGNOSIS — Z79899 Other long term (current) drug therapy: Secondary | ICD-10-CM

## 2013-07-03 DIAGNOSIS — I10 Essential (primary) hypertension: Secondary | ICD-10-CM | POA: Diagnosis present

## 2013-07-03 DIAGNOSIS — K219 Gastro-esophageal reflux disease without esophagitis: Secondary | ICD-10-CM | POA: Diagnosis present

## 2013-07-03 DIAGNOSIS — Z923 Personal history of irradiation: Secondary | ICD-10-CM

## 2013-07-03 DIAGNOSIS — H548 Legal blindness, as defined in USA: Secondary | ICD-10-CM | POA: Diagnosis present

## 2013-07-03 DIAGNOSIS — Z981 Arthrodesis status: Secondary | ICD-10-CM

## 2013-07-03 DIAGNOSIS — H409 Unspecified glaucoma: Secondary | ICD-10-CM | POA: Diagnosis present

## 2013-07-03 DIAGNOSIS — Z9221 Personal history of antineoplastic chemotherapy: Secondary | ICD-10-CM

## 2013-07-03 DIAGNOSIS — Z85118 Personal history of other malignant neoplasm of bronchus and lung: Secondary | ICD-10-CM

## 2013-07-03 DIAGNOSIS — F17200 Nicotine dependence, unspecified, uncomplicated: Secondary | ICD-10-CM | POA: Diagnosis present

## 2013-07-03 DIAGNOSIS — Q762 Congenital spondylolisthesis: Principal | ICD-10-CM

## 2013-07-03 DIAGNOSIS — E785 Hyperlipidemia, unspecified: Secondary | ICD-10-CM | POA: Diagnosis present

## 2013-07-03 DIAGNOSIS — F411 Generalized anxiety disorder: Secondary | ICD-10-CM | POA: Diagnosis present

## 2013-07-03 HISTORY — DX: Family history of other specified conditions: Z84.89

## 2013-07-03 HISTORY — PX: MAXIMUM ACCESS (MAS)POSTERIOR LUMBAR INTERBODY FUSION (PLIF) 1 LEVEL: SHX6368

## 2013-07-03 LAB — GLUCOSE, CAPILLARY
Glucose-Capillary: 139 mg/dL — ABNORMAL HIGH (ref 70–99)
Glucose-Capillary: 149 mg/dL — ABNORMAL HIGH (ref 70–99)

## 2013-07-03 SURGERY — FOR MAXIMUM ACCESS (MAS) POSTERIOR LUMBAR INTERBODY FUSION (PLIF) 1 LEVEL
Anesthesia: General | Site: Back

## 2013-07-03 MED ORDER — SUFENTANIL CITRATE 50 MCG/ML IV SOLN: INTRAVENOUS | Status: DC | PRN

## 2013-07-03 MED ORDER — SUFENTANIL CITRATE 50 MCG/ML IV SOLN
INTRAVENOUS | Status: AC
  Filled 2013-07-03: qty 1

## 2013-07-03 MED ORDER — SUFENTANIL CITRATE 50 MCG/ML IV SOLN
INTRAVENOUS | Status: DC | PRN
  Administered 2013-07-03: 50 ug via INTRAVENOUS

## 2013-07-03 MED ORDER — HYDROCHLOROTHIAZIDE 12.5 MG PO CAPS
12.5000 mg | ORAL_CAPSULE | Freq: Every day | ORAL | Status: DC
  Administered 2013-07-03 – 2013-07-05 (×3): 12.5 mg via ORAL
  Filled 2013-07-03 (×3): qty 1

## 2013-07-03 MED ORDER — VALSARTAN-HYDROCHLOROTHIAZIDE 160-12.5 MG PO TABS: 1.0000 | ORAL_TABLET | Freq: Every day | ORAL | Status: DC

## 2013-07-03 MED ORDER — PROPOFOL 10 MG/ML IV BOLUS
INTRAVENOUS | Status: AC
  Filled 2013-07-03: qty 20

## 2013-07-03 MED ORDER — IRBESARTAN 300 MG PO TABS
300.0000 mg | ORAL_TABLET | Freq: Every day | ORAL | Status: DC
  Administered 2013-07-03 – 2013-07-05 (×3): 300 mg via ORAL
  Filled 2013-07-03 (×3): qty 1

## 2013-07-03 MED ORDER — OXYCODONE-ACETAMINOPHEN 5-325 MG PO TABS
1.0000 | ORAL_TABLET | ORAL | Status: DC | PRN
  Administered 2013-07-03 – 2013-07-05 (×8): 2 via ORAL
  Filled 2013-07-03 (×8): qty 2

## 2013-07-03 MED ORDER — PROPOFOL 10 MG/ML IV BOLUS
INTRAVENOUS | Status: DC | PRN
  Administered 2013-07-03: 200 mg via INTRAVENOUS
  Administered 2013-07-03: 300 mg via INTRAVENOUS
  Administered 2013-07-03 (×2): 100 mg via INTRAVENOUS

## 2013-07-03 MED ORDER — VARENICLINE TARTRATE 0.5 MG PO TABS: 0.5000 mg | ORAL_TABLET | Freq: Every day | ORAL | Status: DC

## 2013-07-03 MED ORDER — LIDOCAINE HCL 4 % MT SOLN
OROMUCOSAL | Status: DC | PRN
  Administered 2013-07-03: 4 mL via TOPICAL

## 2013-07-03 MED ORDER — VARENICLINE TARTRATE 0.5 MG PO TABS
0.5000 mg | ORAL_TABLET | Freq: Two times a day (BID) | ORAL | Status: DC
  Administered 2013-07-03 – 2013-07-05 (×4): 0.5 mg via ORAL
  Filled 2013-07-03 (×6): qty 1

## 2013-07-03 MED ORDER — GABAPENTIN 300 MG PO CAPS
300.0000 mg | ORAL_CAPSULE | Freq: Three times a day (TID) | ORAL | Status: DC
  Administered 2013-07-03 – 2013-07-05 (×5): 300 mg via ORAL
  Filled 2013-07-03 (×7): qty 1

## 2013-07-03 MED ORDER — CARISOPRODOL 350 MG PO TABS
350.0000 mg | ORAL_TABLET | Freq: Three times a day (TID) | ORAL | Status: DC
  Administered 2013-07-03 – 2013-07-05 (×6): 350 mg via ORAL
  Filled 2013-07-03 (×6): qty 1

## 2013-07-03 MED ORDER — PROMETHAZINE HCL 25 MG/ML IJ SOLN
INTRAMUSCULAR | Status: AC
  Filled 2013-07-03: qty 1

## 2013-07-03 MED ORDER — ACETAMINOPHEN 325 MG PO TABS
650.0000 mg | ORAL_TABLET | ORAL | Status: DC | PRN
  Administered 2013-07-05: 650 mg via ORAL
  Filled 2013-07-03: qty 2

## 2013-07-03 MED ORDER — ONDANSETRON HCL 4 MG/2ML IJ SOLN
4.0000 mg | INTRAMUSCULAR | Status: DC | PRN
  Administered 2013-07-03 – 2013-07-05 (×2): 4 mg via INTRAVENOUS
  Filled 2013-07-03 (×2): qty 2

## 2013-07-03 MED ORDER — OXYCODONE HCL 5 MG/5ML PO SOLN: 5.0000 mg | Freq: Once | ORAL | Status: DC | PRN

## 2013-07-03 MED ORDER — SODIUM CHLORIDE 0.9 % IV SOLN: INTRAVENOUS | Status: DC

## 2013-07-03 MED ORDER — SUFENTANIL CITRATE 250 MCG/5ML IV SOLN
250.0000 ug | INTRAVENOUS | Status: DC | PRN
  Administered 2013-07-03: .25 ug/kg/h via INTRAVENOUS

## 2013-07-03 MED ORDER — MIDAZOLAM HCL 2 MG/2ML IJ SOLN
INTRAMUSCULAR | Status: AC
  Filled 2013-07-03: qty 2

## 2013-07-03 MED ORDER — CARVEDILOL 25 MG PO TABS
25.0000 mg | ORAL_TABLET | Freq: Two times a day (BID) | ORAL | Status: DC
  Administered 2013-07-03 – 2013-07-05 (×4): 25 mg via ORAL
  Filled 2013-07-03 (×6): qty 1

## 2013-07-03 MED ORDER — LACTATED RINGERS IV SOLN
INTRAVENOUS | Status: DC | PRN
  Administered 2013-07-03: 10:00:00 via INTRAVENOUS

## 2013-07-03 MED ORDER — SENNA 8.6 MG PO TABS
1.0000 | ORAL_TABLET | Freq: Two times a day (BID) | ORAL | Status: DC
  Administered 2013-07-03 – 2013-07-05 (×4): 8.6 mg via ORAL
  Filled 2013-07-03 (×6): qty 1

## 2013-07-03 MED ORDER — VANCOMYCIN HCL 1000 MG IV SOLR
INTRAVENOUS | Status: DC | PRN
  Administered 2013-07-03: 1000 mg via TOPICAL

## 2013-07-03 MED ORDER — MIDAZOLAM HCL 5 MG/5ML IJ SOLN
INTRAMUSCULAR | Status: DC | PRN
  Administered 2013-07-03: 2 mg via INTRAVENOUS

## 2013-07-03 MED ORDER — CARVEDILOL 25 MG PO TABS
25.0000 mg | ORAL_TABLET | ORAL | Status: AC
  Administered 2013-07-03: 25 mg via ORAL
  Filled 2013-07-03: qty 1

## 2013-07-03 MED ORDER — ACETAMINOPHEN 650 MG RE SUPP: 650.0000 mg | RECTAL | Status: DC | PRN

## 2013-07-03 MED ORDER — BRIMONIDINE TARTRATE 0.2 % OP SOLN
1.0000 [drp] | Freq: Two times a day (BID) | OPHTHALMIC | Status: DC
  Administered 2013-07-04 – 2013-07-05 (×3): 1 [drp] via OPHTHALMIC
  Filled 2013-07-03: qty 5

## 2013-07-03 MED ORDER — ACETAMINOPHEN 160 MG/5ML PO SOLN
325.0000 mg | ORAL | Status: DC | PRN
  Filled 2013-07-03: qty 20.3

## 2013-07-03 MED ORDER — LACTATED RINGERS IV SOLN
INTRAVENOUS | Status: DC
  Administered 2013-07-03: 35 mL/h via INTRAVENOUS

## 2013-07-03 MED ORDER — LACTATED RINGERS IV SOLN
INTRAVENOUS | Status: DC | PRN
  Administered 2013-07-03 (×2): via INTRAVENOUS

## 2013-07-03 MED ORDER — THROMBIN 20000 UNITS EX SOLR
CUTANEOUS | Status: DC | PRN
  Administered 2013-07-03: 11:00:00 via TOPICAL

## 2013-07-03 MED ORDER — IPRATROPIUM BROMIDE 0.06 % NA SOLN
2.0000 | Freq: Four times a day (QID) | NASAL | Status: DC
  Administered 2013-07-04 – 2013-07-05 (×4): 2 via NASAL
  Filled 2013-07-03: qty 15

## 2013-07-03 MED ORDER — POTASSIUM CHLORIDE IN NACL 20-0.9 MEQ/L-% IV SOLN
INTRAVENOUS | Status: DC
Start: 2013-07-03 — End: 2013-07-05
  Administered 2013-07-03: 18:00:00 via INTRAVENOUS
  Filled 2013-07-03 (×5): qty 1000

## 2013-07-03 MED ORDER — ARTIFICIAL TEARS OP OINT
TOPICAL_OINTMENT | OPHTHALMIC | Status: DC | PRN
  Administered 2013-07-03: 1 via OPHTHALMIC

## 2013-07-03 MED ORDER — 0.9 % SODIUM CHLORIDE (POUR BTL) OPTIME
TOPICAL | Status: DC | PRN
  Administered 2013-07-03: 1000 mL

## 2013-07-03 MED ORDER — SODIUM CHLORIDE 0.9 % IR SOLN
Status: DC | PRN
  Administered 2013-07-03: 11:00:00

## 2013-07-03 MED ORDER — PHENOL 1.4 % MT LIQD: 1.0000 | OROMUCOSAL | Status: DC | PRN

## 2013-07-03 MED ORDER — PHENYLEPHRINE HCL 10 MG/ML IJ SOLN
10.0000 mg | INTRAVENOUS | Status: DC | PRN
  Administered 2013-07-03: 10 ug/min via INTRAVENOUS

## 2013-07-03 MED ORDER — PROMETHAZINE HCL 25 MG/ML IJ SOLN
6.2500 mg | INTRAMUSCULAR | Status: DC | PRN
  Administered 2013-07-03: 12.5 mg via INTRAVENOUS

## 2013-07-03 MED ORDER — MENTHOL 3 MG MT LOZG: 1.0000 | LOZENGE | OROMUCOSAL | Status: DC | PRN

## 2013-07-03 MED ORDER — GLIPIZIDE 10 MG PO TABS
10.0000 mg | ORAL_TABLET | Freq: Two times a day (BID) | ORAL | Status: DC
  Administered 2013-07-03 – 2013-07-05 (×4): 10 mg via ORAL
  Filled 2013-07-03 (×6): qty 1

## 2013-07-03 MED ORDER — PROPOFOL INFUSION 10 MG/ML OPTIME
INTRAVENOUS | Status: DC | PRN
  Administered 2013-07-03: 250 ug/kg/min via INTRAVENOUS
  Administered 2013-07-03: 100 ug/kg/min via INTRAVENOUS

## 2013-07-03 MED ORDER — HYDROMORPHONE HCL PF 1 MG/ML IJ SOLN
0.5000 mg | INTRAMUSCULAR | Status: DC | PRN
  Administered 2013-07-03 – 2013-07-04 (×6): 1 mg via INTRAVENOUS
  Filled 2013-07-03 (×6): qty 1

## 2013-07-03 MED ORDER — SODIUM CHLORIDE 0.9 % IV SOLN: 250.0000 mL | INTRAVENOUS | Status: DC

## 2013-07-03 MED ORDER — PHENYLEPHRINE HCL 10 MG/ML IJ SOLN
INTRAMUSCULAR | Status: AC
  Filled 2013-07-03: qty 1

## 2013-07-03 MED ORDER — CARVEDILOL 12.5 MG PO TABS
ORAL_TABLET | ORAL | Status: AC
  Administered 2013-07-03: 25 mg via ORAL
  Filled 2013-07-03: qty 2

## 2013-07-03 MED ORDER — HYDROMORPHONE HCL PF 1 MG/ML IJ SOLN
0.2500 mg | INTRAMUSCULAR | Status: DC | PRN
  Administered 2013-07-03 (×3): 0.5 mg via INTRAVENOUS

## 2013-07-03 MED ORDER — SODIUM CHLORIDE 0.9 % IJ SOLN
3.0000 mL | Freq: Two times a day (BID) | INTRAMUSCULAR | Status: DC
  Administered 2013-07-03 – 2013-07-04 (×3): 3 mL via INTRAVENOUS

## 2013-07-03 MED ORDER — CELECOXIB 200 MG PO CAPS
200.0000 mg | ORAL_CAPSULE | Freq: Two times a day (BID) | ORAL | Status: DC
Start: 2013-07-03 — End: 2013-07-05
  Administered 2013-07-03 – 2013-07-05 (×4): 200 mg via ORAL
  Filled 2013-07-03 (×5): qty 1

## 2013-07-03 MED ORDER — ACETAMINOPHEN 10 MG/ML IV SOLN
INTRAVENOUS | Status: AC
  Administered 2013-07-03: 1000 mg via INTRAVENOUS
  Filled 2013-07-03: qty 100

## 2013-07-03 MED ORDER — AMLODIPINE BESYLATE 10 MG PO TABS
10.0000 mg | ORAL_TABLET | Freq: Every morning | ORAL | Status: DC
  Administered 2013-07-04 – 2013-07-05 (×2): 10 mg via ORAL
  Filled 2013-07-03 (×2): qty 1

## 2013-07-03 MED ORDER — OXYCODONE HCL 5 MG PO TABS: 5.0000 mg | ORAL_TABLET | Freq: Once | ORAL | Status: DC | PRN

## 2013-07-03 MED ORDER — ACETAMINOPHEN 325 MG PO TABS: 325.0000 mg | ORAL_TABLET | ORAL | Status: DC | PRN

## 2013-07-03 MED ORDER — VANCOMYCIN HCL 1000 MG IV SOLR
INTRAVENOUS | Status: AC
  Filled 2013-07-03: qty 1000

## 2013-07-03 MED ORDER — HYDROMORPHONE HCL PF 1 MG/ML IJ SOLN
INTRAMUSCULAR | Status: AC
Start: 2013-07-03 — End: 2013-07-04
  Filled 2013-07-03: qty 2

## 2013-07-03 MED ORDER — SODIUM CHLORIDE 0.9 % IJ SOLN: 3.0000 mL | INTRAMUSCULAR | Status: DC | PRN

## 2013-07-03 MED ORDER — BUPIVACAINE HCL (PF) 0.25 % IJ SOLN
INTRAMUSCULAR | Status: DC | PRN
  Administered 2013-07-03: 4 mL

## 2013-07-03 MED ORDER — POTASSIUM CHLORIDE CRYS ER 20 MEQ PO TBCR
20.0000 meq | EXTENDED_RELEASE_TABLET | Freq: Every day | ORAL | Status: DC
  Administered 2013-07-03 – 2013-07-05 (×3): 20 meq via ORAL
  Filled 2013-07-03 (×3): qty 1

## 2013-07-03 SURGICAL SUPPLY — 73 items
APL SKNCLS STERI-STRIP NONHPOA (GAUZE/BANDAGES/DRESSINGS) ×1
BAG DECANTER FOR FLEXI CONT (MISCELLANEOUS) ×2 IMPLANT
BENZOIN TINCTURE PRP APPL 2/3 (GAUZE/BANDAGES/DRESSINGS) ×2 IMPLANT
BLADE 10 SAFETY STRL DISP (BLADE) ×1 IMPLANT
BLADE SURG ROTATE 9660 (MISCELLANEOUS) IMPLANT
BONE MATRIX OSTEOCEL PRO MED (Bone Implant) ×2 IMPLANT
BUR MATCHSTICK NEURO 3.0 LAGG (BURR) ×2 IMPLANT
CAGE MAS PLIF 9X9X23-8 LUMBAR (Cage) ×2 IMPLANT
CANISTER SUCT 3000ML (MISCELLANEOUS) ×2 IMPLANT
CLIP NEUROVISION LG (CLIP) ×1 IMPLANT
CONT SPEC 4OZ CLIKSEAL STRL BL (MISCELLANEOUS) ×4 IMPLANT
COVER BACK TABLE 24X17X13 BIG (DRAPES) IMPLANT
COVER TABLE BACK 60X90 (DRAPES) ×2 IMPLANT
DRAPE C-ARM 42X72 X-RAY (DRAPES) ×2 IMPLANT
DRAPE C-ARMOR (DRAPES) ×2 IMPLANT
DRAPE LAPAROTOMY 100X72X124 (DRAPES) ×2 IMPLANT
DRAPE POUCH INSTRU U-SHP 10X18 (DRAPES) ×2 IMPLANT
DRAPE SURG 17X23 STRL (DRAPES) ×2 IMPLANT
DRESSING TELFA 8X3 (GAUZE/BANDAGES/DRESSINGS) ×2 IMPLANT
DRSG OPSITE 4X5.5 SM (GAUZE/BANDAGES/DRESSINGS) ×3 IMPLANT
DRSG OPSITE POSTOP 4X6 (GAUZE/BANDAGES/DRESSINGS) ×2 IMPLANT
DURAPREP 26ML APPLICATOR (WOUND CARE) ×2 IMPLANT
ELECT BLADE 4.0 EZ CLEAN MEGAD (MISCELLANEOUS) ×2
ELECT REM PT RETURN 9FT ADLT (ELECTROSURGICAL) ×2
ELECTRODE BLDE 4.0 EZ CLN MEGD (MISCELLANEOUS) IMPLANT
ELECTRODE REM PT RTRN 9FT ADLT (ELECTROSURGICAL) ×1 IMPLANT
EVACUATOR 1/8 PVC DRAIN (DRAIN) ×2 IMPLANT
GAUZE SPONGE 4X4 16PLY XRAY LF (GAUZE/BANDAGES/DRESSINGS) IMPLANT
GLOVE BIO SURGEON STRL SZ8 (GLOVE) ×4 IMPLANT
GLOVE BIOGEL PI IND STRL 7.0 (GLOVE) IMPLANT
GLOVE BIOGEL PI INDICATOR 7.0 (GLOVE) ×3
GLOVE ECLIPSE 6.5 STRL STRAW (GLOVE) ×2 IMPLANT
GLOVE ECLIPSE 7.0 STRL STRAW (GLOVE) ×2 IMPLANT
GLOVE SS N UNI LF 7.0 STRL (GLOVE) ×5 IMPLANT
GOWN BRE IMP SLV AUR LG STRL (GOWN DISPOSABLE) IMPLANT
GOWN BRE IMP SLV AUR XL STRL (GOWN DISPOSABLE) ×2 IMPLANT
GOWN STRL REIN 2XL LVL4 (GOWN DISPOSABLE) IMPLANT
GOWN STRL REUS W/ TWL LRG LVL3 (GOWN DISPOSABLE) IMPLANT
GOWN STRL REUS W/ TWL XL LVL3 (GOWN DISPOSABLE) IMPLANT
GOWN STRL REUS W/TWL LRG LVL3 (GOWN DISPOSABLE) ×2
GOWN STRL REUS W/TWL XL LVL3 (GOWN DISPOSABLE) ×8
HEMOSTAT POWDER KIT SURGIFOAM (HEMOSTASIS) IMPLANT
KIT BASIN OR (CUSTOM PROCEDURE TRAY) ×2 IMPLANT
KIT NDL NVM5 EMG ELECT (KITS) IMPLANT
KIT NEEDLE NVM5 EMG ELECT (KITS) ×1 IMPLANT
KIT NEEDLE NVM5 EMG ELECTRODE (KITS) ×1
KIT ROOM TURNOVER OR (KITS) ×2 IMPLANT
MILL MEDIUM DISP (BLADE) ×1 IMPLANT
NDL HYPO 25X1 1.5 SAFETY (NEEDLE) ×1 IMPLANT
NEEDLE HYPO 25X1 1.5 SAFETY (NEEDLE) ×2 IMPLANT
NS IRRIG 1000ML POUR BTL (IV SOLUTION) ×2 IMPLANT
PACK LAMINECTOMY NEURO (CUSTOM PROCEDURE TRAY) ×2 IMPLANT
PAD ARMBOARD 7.5X6 YLW CONV (MISCELLANEOUS) ×6 IMPLANT
ROD 35MM (Rod) ×4 IMPLANT
SCREW MAS PLIF 5.5X30 (Screw) ×2 IMPLANT
SCREW PAS PLIF 5X30 (Screw) IMPLANT
SCREW SHANK 5.0X30MM (Screw) ×1 IMPLANT
SCREW SHANK PLIF 5.0X25 LUMBAR (Screw) ×1 IMPLANT
SCREW TULIP 5.5 (Screw) ×2 IMPLANT
SPONGE LAP 4X18 X RAY DECT (DISPOSABLE) IMPLANT
SPONGE SURGIFOAM ABS GEL 100 (HEMOSTASIS) ×2 IMPLANT
STRIP CLOSURE SKIN 1/2X4 (GAUZE/BANDAGES/DRESSINGS) ×4 IMPLANT
SUT VIC AB 0 CT1 18XCR BRD8 (SUTURE) ×1 IMPLANT
SUT VIC AB 0 CT1 8-18 (SUTURE) ×2
SUT VIC AB 2-0 CP2 18 (SUTURE) ×2 IMPLANT
SUT VIC AB 3-0 SH 8-18 (SUTURE) ×4 IMPLANT
SYR 20ML ECCENTRIC (SYRINGE) ×2 IMPLANT
SYR 3ML LL SCALE MARK (SYRINGE) IMPLANT
TAPE STRIPS DRAPE STRL (GAUZE/BANDAGES/DRESSINGS) ×1 IMPLANT
TOWEL OR 17X24 6PK STRL BLUE (TOWEL DISPOSABLE) ×2 IMPLANT
TOWEL OR 17X26 10 PK STRL BLUE (TOWEL DISPOSABLE) ×2 IMPLANT
TRAY FOLEY CATH 14FRSI W/METER (CATHETERS) ×2 IMPLANT
WATER STERILE IRR 1000ML POUR (IV SOLUTION) ×2 IMPLANT

## 2013-07-03 NOTE — Progress Notes (Signed)
Utilization review completed.  

## 2013-07-03 NOTE — Anesthesia Postprocedure Evaluation (Signed)
  Anesthesia Post-op Note  Patient: AJLA MCGEACHY  Procedure(s) Performed: Procedure(s) with comments: FOR MAXIMUM ACCESS (MAS) POSTERIOR LUMBAR INTERBODY FUSION (PLIF) 1 LEVEL (N/A) - FOR MAXIMUM ACCESS (MAS) POSTERIOR LUMBAR INTERBODY FUSION (PLIF) 1 LEVEL (L4-L5)  Patient Location: PACU  Anesthesia Type:General  Level of Consciousness: awake and alert   Airway and Oxygen Therapy: Patient Spontanous Breathing and Patient connected to nasal cannula oxygen  Post-op Pain: mild  Post-op Assessment: Post-op Vital signs reviewed, Patient's Cardiovascular Status Stable, Respiratory Function Stable, Patent Airway, No signs of Nausea or vomiting and Pain level controlled  Post-op Vital Signs: Reviewed and stable  Last Vitals:  Filed Vitals:   07/03/13 1530  BP:   Pulse: 68  Temp:   Resp: 12    Complications: No apparent anesthesia complications

## 2013-07-03 NOTE — Transfer of Care (Signed)
Immediate Anesthesia Transfer of Care Note  Patient: Theresa [name]  Procedure(s) Performed: Procedure(s) with comments: FOR MAXIMUM ACCESS (MAS) POSTERIOR LUMBAR INTERBODY FUSION (PLIF) 1 LEVEL (N/A) - FOR MAXIMUM ACCESS (MAS) POSTERIOR LUMBAR INTERBODY FUSION (PLIF) 1 LEVEL (L4-L5)  Patient Location: PACU  Anesthesia Type:General  Level of Consciousness: awake  Airway & Oxygen Therapy: Patient Spontanous Breathing and Patient connected to face mask oxygen  Post-op Assessment: Report given to PACU RN and Post -op Vital signs reviewed and stable  Post vital signs: Reviewed and stable  Complications: No apparent anesthesia complications

## 2013-07-03 NOTE — Op Note (Signed)
07/03/2013  1:36 PM  PATIENT:  Theresa [name]  48 y.o. 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 redo Decompressive lumbar laminectomy L4-5 requiring more work than would be required for a simple exposure of the disk for PLIF in order to adequately decompress the neural elements and address the spinal stenosis 2. Posterior lumbar interbody fusion L4-5 using PEEK interbody cages packed with morcellized allograft and autograft 3. Posterior fixation L4-5 using cortical pedicle screws.    SURGEON:  Sherley Bounds, MD  ASSISTANTS: Dr. Christella Noa  ANESTHESIA:  General  EBL: 300 ml  Total I/O In: 2200 [I.V.:2200] Out: 600 [Urine:300; Blood:300]  BLOOD ADMINISTERED:none  DRAINS: Hemovac   INDICATION FOR PROCEDURE: This patient underwent a L4-5 laminectomy by another surgeon couple of years ago. She presented with severe back and bilateral hip and leg pain. MRI showed spondylosis and facet arthropathy with foraminal stenosis L4-5. Plain films with flexion extension views showed dynamic spondylolisthesis at L4-5. She tried medical management without relief. Recommended a decompression and instrumented fusion to address her segmental instability and foraminal stenosis. Patient understood the risks, benefits, and alternatives and potential outcomes and wished to proceed.  PROCEDURE DETAILS:  The patient was brought to the operating room. After induction of generalized endotracheal anesthesia the patient was rolled into the prone position on chest rolls and all pressure points were padded. The patient's lumbar region was cleaned and then prepped with DuraPrep and draped in the usual sterile fashion. Anesthesia was injected and then a dorsal midline incision was made and carried down to the lumbosacral fascia. The fascia was opened and the paraspinous musculature was taken  down in a subperiosteal fashion to expose L4-5. A self-retaining retractor was placed. Intraoperative fluoroscopy confirmed my level, and I started with placement of the L4 cortical pedicle screws. The pedicle screw entry zones were identified utilizing surface landmarks and  AP and lateral fluoroscopy. I scored the cortex with the high-speed drill and then used the hand drill and EMG monitoring to drill an upward and outward direction into the pedicle. I then tapped line to line, and the tap was also monitored. I then placed a 5-0 x 30 mm cortical pedicle screw into the pedicles of L4 bilaterally. I then turned my attention to the decompression and the spinous process was removed and complete lumbar laminectomies, hemi- facetectomies, and foraminotomies were performed at L4-5. There was significant scar tissue at L4-5 on the left. I was careful to de-tethered the scar from the lateral bony edges. No time did I see CSF. The patient had significant foraminal stenosis and this required more work to decompress the neural elements than would be required for a simple exposure of the disc for posterior lumbar interbody fusion. Much more generous decompression was undertaken in order to adequately decompress the neural elements and address the patient's leg pain. The yellow ligament was removed to expose the underlying dura and nerve roots, and generous foraminotomies were performed to adequately decompress the neural elements. Both the exiting and traversing nerve roots were decompressed on both sides until a coronary dilator passed easily along the nerve roots. Once the decompression was complete, I turned my attention to the posterior lower lumbar interbody fusion. The epidural venous vasculature was coagulated and cut sharply. Disc space was incised and the initial discectomy was performed with pituitary rongeurs. The disc space was distracted with sequential distractors to a  height of 10 mm. We then used a series of  scrapers and shavers to prepare the endplates for fusion. The midline was prepared with Epstein curettes. Once the complete discectomy was finished, we packed an appropriate sized peek interbody cage with local autograft and morcellized allograft, gently retracted the nerve root, and tapped the cage into position at L4-5.  The midline between the cages was packed with morselized autograft and allograft. We then turned our attention to the placement of the lower pedicle screws. The pedicle screw entry zones were identified utilizing surface landmarks and fluoroscopy. I drilled into each pedicle utilizing the hand drill and EMG monitoring, and tapped each pedicle with the appropriate tap. We palpated with a ball probe to assure no break in the cortex. We then placed 5-0 x 30 mm cortical pedicle screws into the pedicles bilaterally at L5.  We then placed lordotic rods into the multiaxial screw heads of the pedicle screws and locked these in position with the locking caps and anti-torque device. We then checked our construct with AP and lateral fluoroscopy. Irrigated with copious amounts of bacitracin-containing saline solution. Placed a medium Hemovac drain through separate stab incision. Inspected the nerve roots once again to assure adequate decompression, lined to the dura with Gelfoam, and closed the muscle and the fascia with 0 Vicryl. Closed the subcutaneous tissues with 2-0 Vicryl and subcuticular tissues with 3-0 Vicryl. The skin was closed with benzoin and Steri-Strips. Dressing was then applied, the patient was awakened from general anesthesia and transported to the recovery room in stable condition. At the end of the procedure all sponge, needle and instrument counts were correct.   PLAN OF CARE: Admit to inpatient   PATIENT DISPOSITION:  PACU - hemodynamically stable.   Delay start of Pharmacological VTE agent (>24hrs) due to surgical blood loss or risk of bleeding:  yes

## 2013-07-03 NOTE — Anesthesia Preprocedure Evaluation (Addendum)
Anesthesia Evaluation  Patient identified by MRN, date of birth, ID band Patient awake    Reviewed: Allergy & Precautions, H&P , NPO status , Patient's Chart, lab work & pertinent test results, reviewed documented beta blocker date and time   History of Anesthesia Complications (+) Family history of anesthesia reaction  Airway Mallampati: III TM Distance: >3 FB Neck ROM: Full    Dental  (+) Teeth Intact   Pulmonary asthma , sleep apnea , Current Smoker,  breath sounds clear to auscultation        Cardiovascular hypertension, Pt. on medications and Pt. on home beta blockers - angina- Past MI and - CHF - dysrhythmias - Valvular Problems/MurmursRhythm:Regular     Neuro/Psych  Headaches, PSYCHIATRIC DISORDERS Anxiety Depression  Neuromuscular disease    GI/Hepatic GERD-  Medicated and Controlled,  Endo/Other  diabetes, Type 2, Oral Hypoglycemic Agents  Renal/GU Renal InsufficiencyRenal disease     Musculoskeletal   Abdominal   Peds  Hematology   Anesthesia Other Findings   Reproductive/Obstetrics                          Anesthesia Physical Anesthesia Plan  ASA: III  Anesthesia Plan: General   Post-op Pain Management:    Induction: Intravenous  Airway Management Planned: Oral ETT  Additional Equipment: None  Intra-op Plan:   Post-operative Plan: Extubation in OR  Informed Consent: I have reviewed the patients History and Physical, chart, labs and discussed the procedure including the risks, benefits and alternatives for the proposed anesthesia with the patient or authorized representative who has indicated his/her understanding and acceptance.   Dental advisory given  Plan Discussed with: CRNA and Surgeon  Anesthesia Plan Comments: (Non-Triggering agent anesthetic)        Anesthesia Quick Evaluation

## 2013-07-03 NOTE — Progress Notes (Signed)
Per Nira Conn in Neuro OR they are aware of this patient's family history of MH.

## 2013-07-03 NOTE — H&P (Signed)
Subjective: Patient is a 48 y.o. female admitted for PLIF. Onset of symptoms was a few years ago, gradually worsening since that time.  S/P LL L4-5 by another surgeon. The pain is rated severe, and is located at the across the lower back and radiates to hips and legs. The pain is described as aching and occurs intermittently. The symptoms have been progressive. Symptoms are exacerbated by exercise, extension and standing. 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   Past Medical History  Diagnosis Date  . Hypertension   . Hyperlipidemia   . Blindness, legal     glaucoma and retinitis  pigmentosa  . Glaucoma   . Onychomycosis   . Retinitis pigmentosa   . Chronic eczema   . Diabetes mellitus type II     diet controlled  . Anxiety   . Lung cancer     lung ca dx 2010  . Hx of radiation therapy 01-17-11 to 03-18-11    lung  . History of chemotherapy 01/2011 to 03/2011    concurrent w/radiation therapy  . Chronic headaches 10-17-11    migraines  . Sleep apnea     cpap 5 yrs  . Asthma   . GERD (gastroesophageal reflux disease)   . Arthritis   . Fibromyalgia     denies  . Family history of anesthesia complication     1 son had malignant hyperthermia at 43yrs  tonsils rained  . Family history of malignant hyperthermia     1 son 16 years ago    Past Surgical History  Procedure Laterality Date  . Lung surgery  03/24/08    L vats, L thoracotomy and LUL trisegmentectomy with node dissection  . Chest tube insertion   04/01/08    L hydropneumothorax  . Back surgery    . Portacath placement  02/11/2011    Procedure: INSERTION PORT-A-CATH;  Surgeon: Pierre Bali, MD;  Location: [place];  Service: Thoracic;  Laterality: Right;  9.6Fr. Pre-attached Power Port in Right Internal Jugular  -right chest-remains inplace 10-17-11  . Knee arthroscopy  10-17-11    bil. knee scope(left was torn ligament)  . Lumbar laminectomy/decompression microdiscectomy  10/27/2011    Procedure: LUMBAR  LAMINECTOMY/DECOMPRESSION MICRODISCECTOMY;  Surgeon: Johnn Hai, MD;  Location: [facility];  Service: Orthopedics;  Laterality: N/A;  L4-L5  . Abdominal hysterectomy  09/19/2001    hx. of fibroids. TAH. Ovaries remain.   . Eye surgery Bilateral     lens implant    Prior to Admission medications   Medication Sig Start Date End Date Taking? Authorizing Provider  amLODipine (NORVASC) 10 MG tablet Take 1 tablet (10 mg total) by mouth every morning. 09/25/12  Yes Waldemar Dickens, MD  atorvastatin (LIPITOR) 40 MG tablet Take 1 tablet (40 mg total) by mouth daily. 09/25/12  Yes Waldemar Dickens, MD  brimonidine (ALPHAGAN) 0.15 % ophthalmic solution Place 1 drop into both eyes 2 (two) times daily.   Yes Historical Provider, MD  carisoprodol (SOMA) 350 MG tablet Take 350 mg by mouth 3 (three) times daily.   Yes Historical Provider, MD  carvedilol (COREG) 25 MG tablet Take 1 tablet (25 mg total) by mouth 2 (two) times daily with a meal. 09/25/12  Yes Waldemar Dickens, MD  CHANTIX 0.5 MG tablet TAKE 1 TABLET (0.5 MG TOTAL) BY MOUTH 2 (TWO) TIMES DAILY. 07/02/13  Yes Waldemar Dickens, MD  clobetasol cream (TEMOVATE) 1.30 % Apply 1 application topically daily.   Yes Historical  Provider, MD  gabapentin (NEURONTIN) 300 MG capsule Take 1 capsule (300 mg total) by mouth 3 (three) times daily. 09/25/12 09/25/13 Yes Waldemar Dickens, MD  glipiZIDE (GLUCOTROL) 10 MG tablet Take 1 tablet (10 mg total) by mouth 2 (two) times daily before a meal. 05/03/13  Yes Waldemar Dickens, MD  ipratropium (ATROVENT) 0.06 % nasal spray Place 2 sprays into both nostrils 4 (four) times daily. 05/20/13  Yes Waldemar Dickens, MD  omeprazole (PRILOSEC) 40 MG capsule Take 1 capsule (40 mg total) by mouth daily. 12/21/12  Yes Josalyn C Funches, MD  potassium chloride SA (K-DUR,KLOR-CON) 20 MEQ tablet Take 1 tablet (20 mEq total) by mouth daily. 05/03/13  Yes Waldemar Dickens, MD  valsartan-hydrochlorothiazide (DIOVAN-HCT) 160-12.5 MG per tablet Take 1  tablet by mouth daily.   Yes Historical Provider, MD  varenicline (CHANTIX) 0.5 MG tablet Take 0.5 mg by mouth 2 (two) times daily.   Yes Historical Provider, MD   Allergies  Allergen Reactions  . Morphine And Related Nausea Only  . Codeine     REACTION: GI upset  . Penicillins     REACTION: hives  . Tomato Hives and Itching    History  Substance Use Topics  . Smoking status: Current Some Day Smoker -- 0.50 packs/day for 30 years    Types: Cigarettes  . Smokeless tobacco: Never Used     Comment: using chantix, hx 1 1/2 PPD(has decrease to 3 cigs per day)  . Alcohol Use: Yes     Comment: occasional    Family History  Problem Relation Age of Onset  . Cancer Maternal Aunt     brain  . Depression Mother   . Heart disease Mother   . Depression Son   . Diabetes Maternal Grandmother   . Stroke Maternal Grandmother   . Depression Maternal Grandmother   . Depression Daughter   . Malignant hyperthermia Son 16    tonsils drained     Review of Systems  Positive ROS: neg  All other systems have been reviewed and were otherwise negative with the exception of those mentioned in the HPI and as above.  Objective: Vital signs in last 24 hours: Temp:  [98.2 F (36.8 C)] 98.2 F (36.8 C) (04/22 0747) Pulse Rate:  [71] 71 (04/22 0820) Resp:  [20] 20 (04/22 0747) BP: (145)/(79) 145/79 mmHg (04/22 0820) SpO2:  [100 %] 100 % (04/22 0747) Weight:  [101.606 kg (224 lb)] 101.606 kg (224 lb) (04/22 0747)  General Appearance: Alert, cooperative, no distress, appears stated age Head: Normocephalic, without obvious abnormality, atraumatic Eyes: PERRL, conjunctiva/corneas clear, EOM's intact    Neck: Supple, symmetrical, trachea midline Back: Symmetric, no curvature, ROM normal, no CVA tenderness Lungs:  respirations unlabored Heart: Regular rate and rhythm Abdomen: Soft, non-tender Extremities: Extremities normal, atraumatic, no cyanosis or edema Pulses: 2+ and symmetric all  extremities Skin: Skin color, texture, turgor normal, no rashes or lesions  NEUROLOGIC:   Mental status: Alert and oriented x4,  no aphasia, good attention span, fund of knowledge, and memory Motor Exam - grossly normal Sensory Exam - grossly normal Reflexes: 1+ Coordination - grossly normal Gait - grossly normal Balance - grossly normal Cranial Nerves: I: smell Not tested  II: visual acuity  OS: nl    OD: nl  II: visual fields Full to confrontation  II: pupils Equal, round, reactive to light  III,VII: ptosis None  III,IV,VI: extraocular muscles  Full ROM  V: mastication Normal  V:  facial light touch sensation  Normal  V,VII: corneal reflex  Present  VII: facial muscle function - upper  Normal  VII: facial muscle function - lower Normal  VIII: hearing Not tested  IX: soft palate elevation  Normal  IX,X: gag reflex Present  XI: trapezius strength  5/5  XI: sternocleidomastoid strength 5/5  XI: neck flexion strength  5/5  XII: tongue strength  Normal    Data Review Lab Results  Component Value Date   WBC 9.8 06/25/2013   HGB 17.2* 06/25/2013   HCT 50.2* 06/25/2013   MCV 91.9 06/25/2013   PLT 211 06/25/2013   Lab Results  Component Value Date   NA 141 06/25/2013   K 3.9 06/25/2013   CL 100 06/25/2013   CO2 26 06/25/2013   BUN 14 06/25/2013   CREATININE 1.37* 06/25/2013   GLUCOSE 161* 06/25/2013   Lab Results  Component Value Date   INR 1.00 06/25/2013    Assessment/Plan: Patient admitted for PLIF L4-5. Patient has failed a reasonable attempt at conservative therapy.  I explained the condition and procedure to the patient and answered any questions.  Patient wishes to proceed with procedure as planned. Understands risks/ benefits and typical outcomes of procedure.   Eustace Moore 07/03/2013 9:35 AM

## 2013-07-04 NOTE — Evaluation (Signed)
Occupational Therapy Evaluation Patient Details Name: Theresa [name] MRN: [id number] DOB: [birth year] Today's Date: 07/04/2013    History of Present Illness Admitted with post lami spondylosis L45, s/p L45 PLIF   Clinical Impression   Pt admitted with above. She demonstrates the below listed deficits and will benefit from continued OT to maximize safety and independence with BADLs.  Pt is unable to access LEs to perform BADLs safely - will benefit from AE.  Pt. Is very motivated.  She has required assist with BADLs and IADLs for some time due to back pain.  Feel she would benefit from [facility] to allow her to return to modified independence.      Follow Up Recommendations  Home health OT;Supervision/Assistance - 24 hour    Equipment Recommendations  None recommended by OT    Recommendations for Other Services       Precautions / Restrictions Precautions Precautions: Back;Fall Required Braces or Orthoses: Spinal Brace Spinal Brace: Lumbar corset;Applied in sitting position      Mobility Bed Mobility Overal bed mobility: Needs Assistance Bed Mobility: Rolling;Sidelying to Sit Rolling: Min guard Sidelying to sit: Min guard       General bed mobility comments: cue for precuations  Transfers Overall transfer level: Needs assistance Equipment used: Rolling walker (2 wheeled) Transfers: Sit to/from [facility] Sit to Stand: Min guard Stand pivot transfers: Min guard       General transfer comment: cues for hand placement and increased time    Balance                                            ADL Overall ADL's : Needs assistance/impaired Eating/Feeding: Modified independent;Sitting   Grooming: Wash/dry hands;Wash/dry face;Min guard;Standing   Upper Body Bathing: Set up;Supervision/ safety;Sitting   Lower Body Bathing: Moderate assistance;Sit to/from stand   Upper Body Dressing : Supervision/safety;Set up;Sitting   Lower Body  Dressing: Maximal assistance;Sit to/from stand   Toilet Transfer: Min guard;Ambulation;Regular Toilet;BSC   Toileting- Water quality scientist and Hygiene: Sit to/from stand;Moderate assistance Toileting - Clothing Manipulation Details (indicate cue type and reason): Pt unable to access peri area without compromising precautions      Functional mobility during ADLs: Min guard;Rolling walker General ADL Comments: Pt unable to access feet by crossing ankles over knees.  Husband has been assisting her with all BADLs for 2 years     Vision                 Additional Comments: Pt has undergone extensive training with [facility]   Perception     Praxis      Pertinent Vitals/Pain 7/10 back pain - pt repositioned and RN notified     Hand Dominance     Extremity/Trunk Assessment Upper Extremity Assessment Upper Extremity Assessment: Overall WFL for tasks assessed   Lower Extremity Assessment Lower Extremity Assessment: Defer to PT evaluation   Cervical / Trunk Assessment Cervical / Trunk Assessment: Normal   Communication Communication Communication: No difficulties   Cognition Arousal/Alertness: Awake/alert Behavior During Therapy: WFL for tasks assessed/performed Overall Cognitive Status: Within Functional Limits for tasks assessed                     General Comments       Exercises       Shoulder Instructions      Home  Living Family/patient expects to be discharged to:: Private residence Living Arrangements: Spouse/significant other (and son) Available Help at Discharge: Family;Other (Comment) (not for 24 hours) Type of Home: House Home Access: Stairs to enter [employer] of Steps: 2 Entrance Stairs-Rails: Right;Left Home Layout: One level     Bathroom Shower/Tub: Occupational psychologist: Standard Bathroom Accessibility: Yes How Accessible: Accessible via walker Home Equipment: [place] - 2 wheels;Bedside  commode;Shower seat;Cane - single point (sighted/mobility cane)          Prior Functioning/Environment Level of Independence: Needs assistance  Gait / Transfers Assistance Needed: guard assist at times ADL's / Homemaking Assistance Needed: assist with bathing and dressing        OT Diagnosis: Generalized weakness;Disturbance of vision;Acute pain   OT Problem List: Decreased strength;Decreased activity tolerance;Impaired vision/perception;Decreased knowledge of use of DME or AE;Decreased knowledge of precautions;Pain   OT Treatment/Interventions: Self-care/ADL training;DME and/or AE instruction;Therapeutic activities;Patient/family education    OT Goals(Current goals can be found in the care plan section) Acute Rehab OT Goals Patient Stated Goal: "get back to living" OT Goal Formulation: With patient Time For Goal Achievement: 07/11/13 Potential to Achieve Goals: Good ADL Goals Pt Will Perform Grooming: with supervision;standing Pt Will Perform Lower Body Bathing: with supervision;sit to/from stand;with adaptive equipment Pt Will Perform Lower Body Dressing: with supervision;with adaptive equipment;sit to/from stand Pt Will Transfer to Toilet: with supervision;ambulating;regular height toilet;bedside commode Pt Will Perform Toileting - Clothing Manipulation and hygiene: with supervision;with adaptive equipment;sit to/from stand Pt Will Perform Tub/Shower Transfer: Shower transfer;with supervision;ambulating;shower seat;rolling walker  OT Frequency: Min 2X/week   Barriers to D/C:            Co-evaluation              End of Session Equipment Utilized During Treatment: Rolling walker;Back brace Nurse Communication: Patient requests pain meds  Activity Tolerance: Patient tolerated treatment well Patient left: in chair;with call bell/phone within reach   Time: 1542-1620 OT Time Calculation (min): 38 min Charges:  OT General Charges $OT Visit: 1 Procedure OT  Evaluation $Initial OT Evaluation Tier I: 1 Procedure OT Treatments $Self Care/Home Management : 8-22 mins $Therapeutic Activity: 8-22 mins G-Codes:    Rashida Ladouceur M Amyra Vantuyl 07-18-2013, 7:13 PM

## 2013-07-04 NOTE — Progress Notes (Signed)
Nutrition Brief Note  Patient identified on the Malnutrition Screening Tool (MST) Report  Wt Readings from Last 15 Encounters:  07/03/13 224 lb (101.606 kg)  07/03/13 224 lb (101.606 kg)  06/25/13 224 lb 9.6 oz (101.878 kg)  05/16/13 220 lb (99.791 kg)  05/15/13 222 lb 11.2 oz (101.016 kg)  05/03/13 224 lb (101.606 kg)  02/27/13 229 lb (103.874 kg)  01/24/13 226 lb (102.513 kg)  01/20/13 229 lb (103.874 kg)  01/02/13 230 lb (104.327 kg)  12/21/12 229 lb (103.874 kg)  11/16/12 227 lb (102.967 kg)  10/31/12 228 lb 1.6 oz (103.465 kg)  10/25/12 226 lb (102.513 kg)  06/25/12 227 lb (102.967 kg)    Body mass index is 40.96 kg/(m^2). Patient meets criteria for Morbid Obesity based on current BMI. Pt reports eating well and having a good appetite PTA. She denies any unintentional weight loss.   Current diet order is Regular, patient is consuming approximately 50-90% of meals at this time. Discussed the importance of good nutrition during recovery and encouraged healthful eating. Labs and medications reviewed.   No nutrition interventions warranted at this time. If nutrition issues arise, please consult RD.   Pryor Ochoa RD, LDN Inpatient Clinical Dietitian Pager: [phone] After Hours Pager: [phone]

## 2013-07-04 NOTE — Progress Notes (Signed)
Patient ID: Theresa [name], female   DOB: 03/11/1966, 48 y.o.   MRN: [id number] Subjective: Patient reports she's very pleased and happy. No leg pain, no N/T/W. Back sore.  Objective: Vital signs in last 24 hours: Temp:  [97.6 F (36.4 C)-99.1 F (37.3 C)] 97.9 F (36.6 C) (04/23 0940) Pulse Rate:  [64-87] 73 (04/23 0940) Resp:  [9-20] 16 (04/23 0940) BP: (118-153)/(68-96) 118/68 mmHg (04/23 0940) SpO2:  [94 %-100 %] 96 % (04/23 0940)  Intake/Output from previous day: 04/22 0701 - 04/23 0700 In: 2900 [I.V.:2900] Out: 2920 [Urine:2600; Drains:20; Blood:300] Intake/Output this shift: Total I/O In: 260 [P.O.:260] Out: -   Neurologic: Grossly normal  Lab Results: Lab Results  Component Value Date   WBC 9.8 06/25/2013   HGB 17.2* 06/25/2013   HCT 50.2* 06/25/2013   MCV 91.9 06/25/2013   PLT 211 06/25/2013   Lab Results  Component Value Date   INR 1.00 06/25/2013   BMET Lab Results  Component Value Date   NA 141 06/25/2013   K 3.9 06/25/2013   CL 100 06/25/2013   CO2 26 06/25/2013   GLUCOSE 161* 06/25/2013   BUN 14 06/25/2013   CREATININE 1.37* 06/25/2013   CALCIUM 9.4 06/25/2013    Studies/Results: Dg Lumbar Spine 2-3 Views  07/03/2013   CLINICAL DATA:  L4-L5 PLIF.  Lumbar fusion.  EXAM: LUMBAR SPINE - 2-3 VIEW  COMPARISON:  06/07/2013.  FINDINGS: AP and lateral intraoperative fluoroscopic spot films demonstrate L4-L5 discectomy with rod and pedicle screw fixation.  IMPRESSION: L4-L5 posterior lumbar interbody fusion.   Electronically Signed   By: Dereck Ligas M.D.   On: 07/03/2013 14:12   Dg C-arm 1-60 Min  07/03/2013   CLINICAL DATA:  L4-L5 posterior lumbar interbody fusion.  EXAM: DG C-ARM 1-60 MIN  TECHNIQUE: AP and lateral intraoperative fluoroscopic spot films.  FLUOROSCOPY TIME:  0 min 57 seconds  COMPARISON:  05/16/2013.  FINDINGS: Posterior rod and screw fixation is present at L4-L5. Discectomy with interbody bone graft.  IMPRESSION: L4-L5 posterior lumbar interbody  fusion.   Electronically Signed   By: Dereck Ligas M.D.   On: 07/03/2013 16:02    Assessment/Plan: Doing very well, home tomorrow   LOS: 1 day    Eustace Moore 07/04/2013, 2:18 PM

## 2013-07-04 NOTE — Progress Notes (Signed)
Occupational Therapy Note  Pt was instructed in use and acquisition of AE.  She is able to perform BADLs with min guard assisst using it.  Will continue with education.  She demonstrates good compliance with back precautions    07/04/13 1911  OT Visit Information  Assistance Needed +1  History of Present Illness Admitted with post lami spondylosis L45, s/p L45 PLIF  OT Time Calculation  OT Start Time 1630  OT Stop Time 1653  OT Time Calculation (min) 23 min  Precautions  Precautions Back;Fall  Required Braces or Orthoses Spinal Brace  Spinal Brace Lumbar corset;Applied in sitting position  Cognition  Arousal/Alertness Awake/alert  Behavior During Therapy WFL for tasks assessed/performed  Overall Cognitive Status Within Functional Limits for tasks assessed  ADL  Lower Body Bathing Min guard;Sitting/lateral leans;Adhering to back precautions;With adaptive equipment  Lower Body Dressing Min guard;Cueing for back precautions;With adaptive equipment;Sit to/from stand  General ADL Comments Pt instructed in use and acquisition of AE for LB ADLs and toileting.  Pt dependent upon tactile input to learn to use equipment, but was able to quickly use it for LB ADLs.   Transfers  Overall transfer level Needs assistance  Equipment used Rolling walker (2 wheeled)  Transfers Sit to/from Stand  Sit to Stand Min guard  OT - End of Session  Equipment Utilized During Treatment Rolling walker;Back brace  Activity Tolerance Patient tolerated treatment well  Patient left in chair;with call bell/phone within reach  OT Assessment/Plan  OT Plan Discharge plan remains appropriate  OT Frequency Min 2X/week  Follow Up Recommendations Home health OT;Supervision/Assistance - 24 hour  OT Equipment None recommended by OT  OT Goal Progression  Progress towards OT goals Progressing toward goals  ADL Goals  Pt Will Perform Grooming with supervision;standing  Pt Will Perform Lower Body Bathing with  supervision;sit to/from stand;with adaptive equipment  Pt Will Perform Lower Body Dressing with supervision;with adaptive equipment;sit to/from stand  Pt Will Transfer to Toilet with supervision;ambulating;regular height toilet;bedside commode  Pt Will Perform Toileting - Clothing Manipulation and hygiene with supervision;with adaptive equipment;sit to/from stand  Pt Will Perform Tub/Shower Transfer Shower transfer;with supervision;ambulating;shower seat;rolling walker  OT General Charges  $OT Visit 1 Procedure  OT Treatments  $Self Care/Home Management  23-37 mins   [facility], OTR/L [phone]

## 2013-07-04 NOTE — Evaluation (Signed)
Physical Therapy Evaluation Patient Details Name: RECHEL DELOSREYES MRN: [id number] DOB: 08-30-1965 Today's Date: 07/04/2013   History of Present Illness  Admitted with post lami spondylosis L45, s/p L45 PLIF  Clinical Impression  Pt admitted with/for lumbar fusion surgery.  Pt currently limited functionally due to the problems listed below.  (see problems list.)  Pt will benefit from PT to maximize function and safety to be able to get home safely with available assist of family.     Follow Up Recommendations Home health PT;Supervision for mobility/OOB    Equipment Recommendations  None recommended by PT    Recommendations for Other Services       Precautions / Restrictions Precautions Precautions: Back;Fall Required Braces or Orthoses: Spinal Brace Spinal Brace: Lumbar corset;Applied in sitting position Restrictions Weight Bearing Restrictions: No      Mobility  Bed Mobility Overal bed mobility: Needs Assistance Bed Mobility: Sidelying to Sit;Rolling;Sit to Sidelying Rolling: Min assist Sidelying to sit: Min assist     Sit to sidelying: Min assist General bed mobility comments: reinforced best technique; minimal truncal assist  Transfers Overall transfer level: Needs assistance Equipment used: Rolling walker (2 wheeled) Transfers: Sit to/from Stand Sit to Stand: Min guard         General transfer comment: reinforced safety and hand placement  Ambulation/Gait Ambulation/Gait assistance: Min guard Ambulation Distance (Feet): 180 Feet Assistive device: Rolling walker (2 wheeled) Gait Pattern/deviations: Step-through pattern Gait velocity: slow Gait velocity interpretation: Below normal speed for age/gender General Gait Details: generally steady, needs guidance due to visual deficits  Stairs            Wheelchair Mobility    Modified Rankin (Stroke Patients Only)       Balance Overall balance assessment: No apparent balance deficits (not formally  assessed)                                           Pertinent Vitals/Pain Up to 9/10 at incision    Home Living Family/patient expects to be discharged to:: Private residence Living Arrangements: Spouse/significant other (and son) Available Help at Discharge: Family;Other (Comment) (not for 24 hours) Type of Home: House Home Access: Stairs to enter Entrance Stairs-Rails: Psychiatric nurse of Steps: 2 Home Layout: One level Home Equipment: Walker - 2 wheels;Bedside commode;Shower seat Additional Comments: Has stand up shower    Prior Function Level of Independence: Needs assistance   Gait / Transfers Assistance Needed: guard assist at times  ADL's / Homemaking Assistance Needed: assist with bathing and dressing        Hand Dominance        Extremity/Trunk Assessment   Upper Extremity Assessment: Defer to OT evaluation           Lower Extremity Assessment: Generalized weakness         Communication   Communication: No difficulties  Cognition Arousal/Alertness: Awake/alert Behavior During Therapy: WFL for tasks assessed/performed Overall Cognitive Status: Within Functional Limits for tasks assessed                      General Comments      Exercises        Assessment/Plan    PT Assessment Patient needs continued PT services  PT Diagnosis Generalized weakness;Acute pain   PT Problem List Decreased strength;Decreased activity tolerance;Decreased mobility;Decreased knowledge of use of DME;Decreased knowledge of  precautions;Pain  PT Treatment Interventions DME instruction;Gait training;Stair training;Functional mobility training;Therapeutic activities;Patient/family education   PT Goals (Current goals can be found in the Care Plan section) Acute Rehab PT Goals Patient Stated Goal: out to stores shopping, back to work PT Goal Formulation: With patient Time For Goal Achievement: 07/11/13 Potential to Achieve  Goals: Good    Frequency Min 5X/week   Barriers to discharge        Co-evaluation               End of Session Equipment Utilized During Treatment: Gait belt Activity Tolerance: Patient tolerated treatment well Patient left: in chair;with call bell/phone within reach;with family/visitor present Nurse Communication: Mobility status         Time: 1005-1046 PT Time Calculation (min): 41 min   Charges:   PT Evaluation $Initial PT Evaluation Tier I: 1 Procedure PT Treatments $Gait Training: 8-22 mins $Self Care/Home Management: 8-22   PT G CodesTessie Fass Tiaria Biby 07/04/2013, 11:07 AM 07/04/2013  Donnella Sham, PT [phone]  (pager)

## 2013-07-04 NOTE — Care Management Note (Unsigned)
    Page 1 of 1   07/04/2013     2:43:55 PM CARE MANAGEMENT NOTE 07/04/2013  Patient:  Theresa [name], Theresa [name]   Account Number:  [id number]  Date Initiated:  07/04/2013  Documentation initiated by:  Lorne Skeens  Subjective/Objective Assessment:   Patient was admitted for a PLIF. Lives at home with spouse.     Action/Plan:   Will follow for discharge needs pending PT/OT evals.   Anticipated DC Date:     Anticipated DC Plan:  [place]  CM consult      Choice offered to / List presented to:             Status of service:  In process, will continue to follow Medicare Important Message given?   (If response is "NO", the following Medicare IM given date fields will be blank) Date Medicare IM given:   Date Additional Medicare IM given:    Discharge Disposition:    Per UR Regulation:  Reviewed for med. necessity/level of care/duration of stay  If discussed at [place] of Stay Meetings, dates discussed:    Comments:  07/04/13 [place], MSN, CM- Per conversation with Dr Ronnald Ramp, no home health orders will be placed at this time. CM will be available if any discharge needs arise.

## 2013-07-05 MED ORDER — CARISOPRODOL 350 MG PO TABS: 350.0000 mg | ORAL_TABLET | Freq: Three times a day (TID) | ORAL | Status: DC

## 2013-07-05 MED ORDER — OXYCODONE-ACETAMINOPHEN 5-325 MG PO TABS: 1.0000 | ORAL_TABLET | Freq: Four times a day (QID) | ORAL | Status: DC | PRN

## 2013-07-05 NOTE — Discharge Summary (Signed)
Physician Discharge Summary  Patient ID: Theresa [name] MRN: [id number] DOB/AGE: [birth year] 50 y.o.  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 The patient tolerated the procedure well and was taken to the recovery room and then to the floor in stable condition. The hospital course was routine. There were no complications. The wound remained clean dry and intact. Pt had appropriate back soreness. No complaints of leg pain or new N/T/W. The patient remained afebrile with stable vital signs, and tolerated a regular diet. The patient continued to increase activities, and pain was well controlled with oral pain medications.   Consults: None  Significant Diagnostic Studies:  Results for orders placed during the hospital encounter of 07/03/13  GLUCOSE, CAPILLARY      Result Value Ref Range   Glucose-Capillary 139 (*) 70 - 99 mg/dL  GLUCOSE, CAPILLARY      Result Value Ref Range   Glucose-Capillary 149 (*) 70 - 99 mg/dL    Chest 2 View  06/25/2013   CLINICAL DATA:  History of left lung carcinoma. Preop for lumbar fusion. Hypertension.  EXAM: CHEST  2 VIEW  COMPARISON:  10/17/2011  FINDINGS: There are changes from resection of the left upper lung with pulmonary anastomosis staples and adjacent vascular clips, pleural parenchymal scarring and mild volume loss. Apical scarring and volume loss has mildly progressed from the prior chest radiograph.  Lungs are otherwise clear. Cardiac silhouette is normal in size. Normal mediastinal right hilar contours.  Right anterior chest wall Port-A-Cath is stable with its tip in the lower superior vena cava.  Bony thorax is intact.  IMPRESSION: No acute cardiopulmonary disease.   Electronically  Signed   By: Lajean Manes M.D.   On: 06/25/2013 12:48   Dg Lumbar Spine 2-3 Views  07/03/2013   CLINICAL DATA:  L4-L5 PLIF.  Lumbar fusion.  EXAM: LUMBAR SPINE - 2-3 VIEW  COMPARISON:  06/07/2013.  FINDINGS: AP and lateral intraoperative fluoroscopic spot films demonstrate L4-L5 discectomy with rod and pedicle screw fixation.  IMPRESSION: L4-L5 posterior lumbar interbody fusion.   Electronically Signed   By: Dereck Ligas M.D.   On: 07/03/2013 14:12   Dg C-arm 1-60 Min  07/03/2013   CLINICAL DATA:  L4-L5 posterior lumbar interbody fusion.  EXAM: DG C-ARM 1-60 MIN  TECHNIQUE: AP and lateral intraoperative fluoroscopic spot films.  FLUOROSCOPY TIME:  0 min 57 seconds  COMPARISON:  05/16/2013.  FINDINGS: Posterior rod and screw fixation is present at L4-L5. Discectomy with interbody bone graft.  IMPRESSION: L4-L5 posterior lumbar interbody fusion.   Electronically Signed   By: Dereck Ligas M.D.   On: 07/03/2013 16:02    Antibiotics:  Anti-infectives   Start     Dose/Rate Route Frequency Ordered Stop   07/03/13 1312  vancomycin (VANCOCIN) powder  Status:  Discontinued       As needed 07/03/13 1312 07/03/13 1408   07/03/13 1310  vancomycin (VANCOCIN) 1000 MG powder    Comments:  Latricia Heft   : cabinet override      07/03/13 1310 07/04/13 0114   07/03/13 1110  bacitracin 50,000 Units in sodium chloride irrigation 0.9 % 500 mL irrigation  Status:  Discontinued       As needed 07/03/13 1111 07/03/13 1408  07/03/13 0600  vancomycin (VANCOCIN) 1,500 mg in sodium chloride 0.9 % 500 mL IVPB     1,500 mg 250 mL/hr over 120 Minutes Intravenous 60 min pre-op 07/02/13 1422 07/03/13 1031      Discharge Exam: Blood pressure 102/61, pulse 65, temperature 98.2 F (36.8 C), temperature source Oral, resp. rate 18, height 5\' 2"  (1.575 m), weight 101.606 kg (224 lb), SpO2 97.00%. Neurologic: Grossly normal Incision clean dry and  Discharge Medications:     Medication List         amLODipine  10 MG tablet  Commonly known as:  NORVASC  Take 1 tablet (10 mg total) by mouth every morning.     atorvastatin 40 MG tablet  Commonly known as:  LIPITOR  Take 1 tablet (40 mg total) by mouth daily.     brimonidine 0.15 % ophthalmic solution  Commonly known as:  ALPHAGAN  Place 1 drop into both eyes 2 (two) times daily.     carisoprodol 350 MG tablet  Commonly known as:  SOMA  Take 1 tablet (350 mg total) by mouth 3 (three) times daily.     carvedilol 25 MG tablet  Commonly known as:  COREG  Take 1 tablet (25 mg total) by mouth 2 (two) times daily with a meal.     clobetasol cream 0.05 %  Commonly known as:  TEMOVATE  Apply 1 application topically daily.     gabapentin 300 MG capsule  Commonly known as:  NEURONTIN  Take 1 capsule (300 mg total) by mouth 3 (three) times daily.     glipiZIDE 10 MG tablet  Commonly known as:  GLUCOTROL  Take 1 tablet (10 mg total) by mouth 2 (two) times daily before a meal.     ipratropium 0.06 % nasal spray  Commonly known as:  ATROVENT  Place 2 sprays into both nostrils 4 (four) times daily.     omeprazole 40 MG capsule  Commonly known as:  PRILOSEC  Take 1 capsule (40 mg total) by mouth daily.     oxyCODONE-acetaminophen 5-325 MG per tablet  Commonly known as:  PERCOCET/ROXICET  Take 1-2 tablets by mouth every 6 (six) hours as needed for moderate pain.     potassium chloride SA 20 MEQ tablet  Commonly known as:  K-DUR,KLOR-CON  Take 1 tablet (20 mEq total) by mouth daily.     valsartan-hydrochlorothiazide 160-12.5 MG per tablet  Commonly known as:  DIOVAN-HCT  Take 1 tablet by mouth daily.     varenicline 0.5 MG tablet  Commonly known as:  CHANTIX  Take 0.5 mg by mouth 2 (two) times daily.     CHANTIX 0.5 MG tablet  Generic drug:  varenicline  TAKE 1 TABLET (0.5 MG TOTAL) BY MOUTH 2 (TWO) TIMES DAILY.        Disposition: Home   Final Dx: PLIF L4-5      Discharge Orders   Future Appointments Provider Department  Dept Phone   08/13/2013 9:30 AM [facility] Flush Nurse [facility] [phone]   09/24/2013 9:30 AM [facility] Flush Nurse [facility] [phone]   11/05/2013 9:30 AM [facility] Flush Nurse [facility] [phone]   11/12/2013 9:00 AM [facility] 2 [facility] [phone]   11/12/2013 10:00 AM [facility] [phone]   Liquids only 4 hours prior to your exam. Any medications can be taken as usual. Please arrive  15 min prior to your scheduled exam time.   11/19/2013 10:15 AM Curt Bears, MD [facility] [phone]   12/17/2013 9:30 AM [facility] [phone]   Future Orders Complete By Expires   Call MD for:  difficulty breathing, headache or visual disturbances  As directed    Call MD for:  persistant nausea and vomiting  As directed    Call MD for:  redness, tenderness, or signs of infection (pain, swelling, redness, odor or green/yellow discharge around incision site)  As directed    Call MD for:  severe uncontrolled pain  As directed    Call MD for:  temperature >100.4  As directed    Diet - low sodium heart healthy  As directed    Discharge instructions  As directed    Increase activity slowly  As directed       Follow-up Information   Follow up with Lekisha Mcghee S, MD In 2 weeks.   Specialty:  Neurosurgery   Contact information:   [address] [place] [address] [phone]        Signed: Eustace Moore 07/05/2013, 9:12 AM

## 2013-07-05 NOTE — Progress Notes (Signed)
Patient d/c this am, with assessments remaining unchanged. Educated on the need for follow up and to call the MD if anything arises.

## 2013-07-05 NOTE — Progress Notes (Signed)
Occupational Therapy Treatment Patient Details Name: Theresa [name] MRN: [id number] DOB: 1965-09-23 Today's Date: 07/05/2013    History of present illness Admitted with post lami spondylosis L45, s/p L45 PLIF   OT comments  Pt has acquired AE for LB dressing. She feel comfortable with use and deferred further practice this date.  She was provided with toileting aid and was able to demonstrate understanding of use.   Follow Up Recommendations  Home health OT;Supervision/Assistance - 24 hour    Equipment Recommendations  None recommended by OT    Recommendations for Other Services      Precautions / Restrictions Precautions Precautions: Back;Fall Required Braces or Orthoses: Spinal Brace Spinal Brace: Lumbar corset;Applied in sitting position Restrictions Weight Bearing Restrictions: No       Mobility Bed Mobility                  Transfers Overall transfer level: Needs assistance Equipment used: Rolling walker (2 wheeled) Transfers: Sit to/from [facility] Sit to Stand: Supervision Stand pivot transfers: Supervision       General transfer comment: cues for hand placement and increased time    Balance Overall balance assessment: No apparent balance deficits (not formally assessed)                                 ADL                           Toilet Transfer: Supervision/safety;Ambulation;BSC;Regular Toilet   Toileting- Water quality scientist and Hygiene: Supervision/safety;Sit to/from stand;With adaptive equipment;Adhering to back precautions Toileting - Clothing Manipulation Details (indicate cue type and reason): Pt provided with toileting aid and was instructed in use.  Simulated practice       General ADL Comments: Pt purchased AE, does not feel she needs to practice further as she is eager to discharge home.  Pt demonstrates good understanding of back precautions       Vision                      Perception     Praxis      Cognition   Behavior During Therapy: WFL for tasks assessed/performed Overall Cognitive Status: Within Functional Limits for tasks assessed                       Extremity/Trunk Assessment               Exercises     Shoulder Instructions       General Comments      Pertinent Vitals/ Pain       Pt with no indication of pain.   Home Living                                          Prior Functioning/Environment              Frequency Min 2X/week     Progress Toward Goals  OT Goals(current goals can now be found in the care plan section)  Progress towards OT goals: Progressing toward goals  Acute Rehab OT Goals Patient Stated Goal: "get back to living" ADL Goals Pt Will Perform Grooming: with supervision;standing Pt Will Perform Lower Body Bathing: with supervision;sit to/from stand;with adaptive equipment  Pt Will Perform Lower Body Dressing: with supervision;with adaptive equipment;sit to/from stand Pt Will Transfer to Toilet: with supervision;ambulating;regular height toilet;bedside commode Pt Will Perform Toileting - Clothing Manipulation and hygiene: with supervision;with adaptive equipment;sit to/from stand Pt Will Perform Tub/Shower Transfer: Shower transfer;with supervision;ambulating;shower seat;rolling walker  Plan Discharge plan remains appropriate    Co-evaluation                 End of Session Equipment Utilized During Treatment: Rolling walker;Back brace   Activity Tolerance Patient tolerated treatment well   Patient Left in chair;with call bell/phone within reach;with family/visitor present   Nurse Communication Other (comment) (ready for discharge)        Time: 5284-1324 OT Time Calculation (min): 13 min  Charges: OT General Charges $OT Visit: 1 Procedure OT Treatments $Self Care/Home Management : 8-22 mins  Theresa [name] 07/05/2013, 11:46 AM

## 2013-07-05 NOTE — Progress Notes (Signed)
Physical Therapy Treatment Patient Details Name: QIARA MINETTI MRN: [id number] DOB: 14-Jul-1965 Today's Date: 07/05/2013    History of Present Illness Admitted with post lami spondylosis L45, s/p L45 PLIF    PT Comments    Emphasis on stairs and progression of activity over next week or 2.  Education completed.  Follow Up Recommendations  Home health PT;Supervision for mobility/OOB     Equipment Recommendations  None recommended by PT    Recommendations for Other Services       Precautions / Restrictions Precautions Precautions: Back;Fall Required Braces or Orthoses: Spinal Brace Spinal Brace: Lumbar corset;Applied in sitting position Restrictions Weight Bearing Restrictions: No    Mobility  Bed Mobility                  Transfers Overall transfer level: Needs assistance Equipment used: Rolling walker (2 wheeled) Transfers: Sit to/from Stand Sit to Stand: Min guard Stand pivot transfers: Min guard       General transfer comment: cues for hand placement and increased time  Ambulation/Gait Ambulation/Gait assistance: Min guard Ambulation Distance (Feet): 200 Feet Assistive device: Rolling walker (2 wheeled) Gait Pattern/deviations: Step-through pattern Gait velocity: slow   General Gait Details: generally steady, needs guidance due to visual deficits   Stairs Stairs: Yes Stairs assistance: Min assist Stair Management: One rail Right;Alternating pattern;Forwards Number of Stairs: 5 General stair comments: min assist for guidance on unfamiliar stairs  Wheelchair Mobility    Modified Rankin (Stroke Patients Only)       Balance Overall balance assessment: No apparent balance deficits (not formally assessed)                                  Cognition Arousal/Alertness: Awake/alert Behavior During Therapy: WFL for tasks assessed/performed Overall Cognitive Status: Within Functional Limits for tasks assessed                       Exercises      General Comments        Pertinent Vitals/Pain     Home Living                      Prior Function            PT Goals (current goals can now be found in the care plan section) Acute Rehab PT Goals Patient Stated Goal: "get back to living" PT Goal Formulation: With patient Time For Goal Achievement: 07/11/13 Potential to Achieve Goals: Good Progress towards PT goals: Progressing toward goals    Frequency  Min 5X/week    PT Plan Current plan remains appropriate    Co-evaluation             End of Session Equipment Utilized During Treatment: Gait belt Activity Tolerance: Patient tolerated treatment well Patient left: in chair;with call bell/phone within reach;with family/visitor present     Time: 3545-6256 PT Time Calculation (min): 24 min  Charges:  $Gait Training: 8-22 mins $Self Care/Home Management: 8-22                    G Codes:      Tessie Fass Yanice Maqueda 07/05/2013, 10:25 AM

## 2013-07-09 ENCOUNTER — Encounter (HOSPITAL_COMMUNITY): Payer: Self-pay | Admitting: Neurological Surgery

## 2013-08-14 ENCOUNTER — Telehealth: Payer: Self-pay | Admitting: *Deleted

## 2013-08-14 NOTE — Telephone Encounter (Signed)
Patient called and scheduled a flush appt for tomorrow

## 2013-08-15 ENCOUNTER — Ambulatory Visit (HOSPITAL_BASED_OUTPATIENT_CLINIC_OR_DEPARTMENT_OTHER): Payer: Medicare Other

## 2013-08-15 ENCOUNTER — Other Ambulatory Visit: Payer: Self-pay | Admitting: *Deleted

## 2013-08-15 VITALS — BP 126/73 | HR 73

## 2013-08-15 DIAGNOSIS — Z95828 Presence of other vascular implants and grafts: Secondary | ICD-10-CM

## 2013-08-15 DIAGNOSIS — C3410 Malignant neoplasm of upper lobe, unspecified bronchus or lung: Secondary | ICD-10-CM

## 2013-08-15 DIAGNOSIS — Z452 Encounter for adjustment and management of vascular access device: Secondary | ICD-10-CM

## 2013-08-15 MED ORDER — HEPARIN SOD (PORK) LOCK FLUSH 100 UNIT/ML IV SOLN
500.0000 [IU] | Freq: Once | INTRAVENOUS | Status: AC
  Administered 2013-08-15: 500 [IU] via INTRAVENOUS
  Filled 2013-08-15: qty 5

## 2013-08-15 MED ORDER — SODIUM CHLORIDE 0.9 % IJ SOLN
10.0000 mL | INTRAMUSCULAR | Status: DC | PRN
  Administered 2013-08-15: 10 mL via INTRAVENOUS
  Filled 2013-08-15: qty 10

## 2013-08-15 NOTE — Progress Notes (Signed)
Pt complains of discomfort to breast where PAC is. Diane,RN made aware of pt concerns. Per Diane, RN pt should try tylenol or motrin and call if symptoms get worse. Pt verbalizes understanding.

## 2013-08-16 MED ORDER — IPRATROPIUM BROMIDE 0.06 % NA SOLN: 2.0000 | Freq: Four times a day (QID) | NASAL | Status: DC

## 2013-08-16 MED ORDER — FLUTICASONE PROPIONATE 50 MCG/ACT NA SUSP: 2.0000 | Freq: Every day | NASAL | Status: DC

## 2013-08-17 IMAGING — CR DG CHEST 1V PORT
1 series · 1 of 1 positions shown · non-contrast
Comparison: Chest x-ray of 02/11/2011

CLINICAL DATA: Post right Port-A-Cath insertion

PORTABLE CHEST - 1 VIEW

[view not recorded]
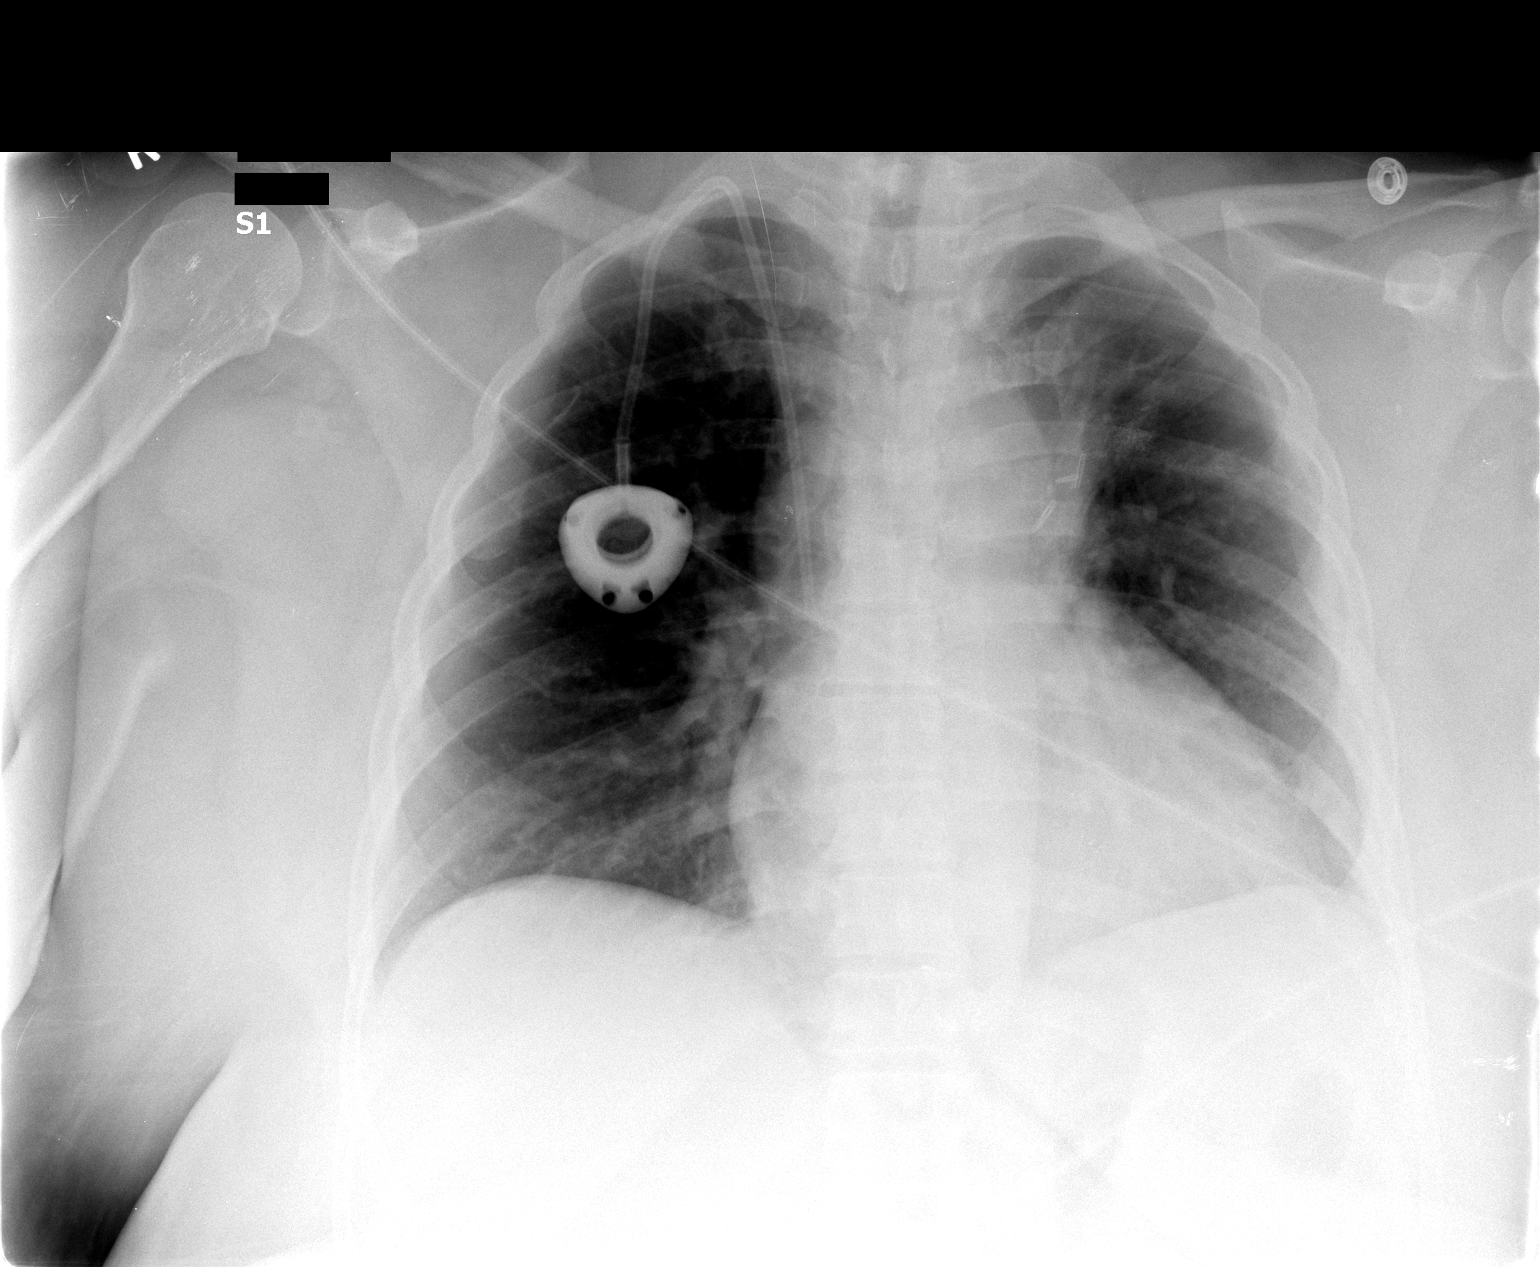

[1 of 1 positions shown; findings below may reference images not displayed]

FINDINGS: A right-sided Port-A-Cath is present with the tip in the
lower SVC.  No pneumothorax is seen. No infiltrate is seen.
Opacity overlies the left aortic knob and is stable with surgical
clips overlying.  The heart is mildly enlarged and stable.  No bony
abnormality is seen.
IMPRESSION: Right-sided right-sided Port-A-Cath tip in lower SVC.  No
pneumothorax.

## 2013-08-26 ENCOUNTER — Telehealth: Payer: Self-pay | Admitting: Family Medicine

## 2013-09-04 NOTE — Telephone Encounter (Signed)
Called patient to come in for lab work. Please schedule for LDL and A1C test as part of pt DM care.  ° °

## 2013-10-08 ENCOUNTER — Other Ambulatory Visit: Payer: Self-pay | Admitting: *Deleted

## 2013-10-08 MED ORDER — GABAPENTIN 300 MG PO CAPS: 300.0000 mg | ORAL_CAPSULE | Freq: Three times a day (TID) | ORAL | Status: DC

## 2013-10-08 MED ORDER — AMLODIPINE BESYLATE 10 MG PO TABS: 10.0000 mg | ORAL_TABLET | Freq: Every morning | ORAL | Status: DC

## 2013-10-08 MED ORDER — CARVEDILOL 25 MG PO TABS: 25.0000 mg | ORAL_TABLET | Freq: Two times a day (BID) | ORAL | Status: DC

## 2013-10-08 MED ORDER — ATORVASTATIN CALCIUM 40 MG PO TABS: 40.0000 mg | ORAL_TABLET | Freq: Every day | ORAL | Status: DC

## 2013-10-13 ENCOUNTER — Telehealth: Payer: Self-pay | Admitting: Family Medicine

## 2013-10-13 NOTE — Telephone Encounter (Signed)
Emergency Line Call  Pt reports very high blood sugars for at least the past two days, one in the 390's and one just over 500. She has few other symptoms other than some 'jitteriness,' feelings of tiredness, and some blurry vision. She previously was seen by Dr. Marily Memos but has not seen him in several months, and has not seen Dr. Lamar Benes, her new PCP at our office now that Dr. Marily Memos has graduated. She reports she takes glipizide 10 mg BID though she states, "it's not working, and I told them it wasn't working" (though she has not followed up specifically about this, as written). She questions whether she can take an extra glipizide or do anything else to help her sugar, today. She denies increased intake of sugary foods, sodas, etc. She has some nausea yesterday but not as bad today, and no vomiting. She denies fever / chills, change in bowel habits, or obvious skin infections or breakdown. She did have back surgery a few months ago but is not on any antibiotics or steroids.  Advised pt that she can indeed take an extra glipizide today, but advised her to call the office first thing tomorrow morning for an appointment within the next couple of days. I explained to her that Dr. Lamar Benes is her new PCP and may or may not be able to see her tomorrow or Tuesday, as I do not know his schedule, but that she can be seen by someone else. Suggested she drink plenty of water to help flush glucose out in the urine. Also advised her to present to the ED if her symptoms progress or worsen, or if her sugar does not improve with the extra glipizide. I did not discuss future options with her specifically, but future providers could consider addition of metformin or other oral or injectable agents if not insulin, outright, depending on how her sugars change (or not).  Note routed to Dr. Juel Burrow.  Emmaline Kluver, MD PGY-3, [facility] 10/13/2013, 2:33 PM

## 2013-10-21 ENCOUNTER — Ambulatory Visit (INDEPENDENT_AMBULATORY_CARE_PROVIDER_SITE_OTHER): Payer: Medicare Other | Admitting: Family Medicine

## 2013-10-21 ENCOUNTER — Encounter: Payer: Self-pay | Admitting: Family Medicine

## 2013-10-21 VITALS — BP 122/83 | HR 73 | Temp 98.2°F | Ht 62.0 in | Wt 224.7 lb

## 2013-10-21 DIAGNOSIS — I10 Essential (primary) hypertension: Secondary | ICD-10-CM

## 2013-10-21 DIAGNOSIS — F17200 Nicotine dependence, unspecified, uncomplicated: Secondary | ICD-10-CM

## 2013-10-21 DIAGNOSIS — E119 Type 2 diabetes mellitus without complications: Secondary | ICD-10-CM

## 2013-10-21 LAB — POCT GLYCOSYLATED HEMOGLOBIN (HGB A1C): Hemoglobin A1C: 7.5

## 2013-10-21 MED ORDER — CLOBETASOL PROPIONATE 0.05 % EX CREA: 1.0000 "application " | TOPICAL_CREAM | Freq: Every day | CUTANEOUS | Status: DC

## 2013-10-21 MED ORDER — ALBUTEROL SULFATE HFA 108 (90 BASE) MCG/ACT IN AERS
2.0000 | INHALATION_SPRAY | Freq: Four times a day (QID) | RESPIRATORY_TRACT | Status: DC | PRN
Start: 2013-10-21 — End: 2014-12-23

## 2013-10-21 MED ORDER — VARENICLINE TARTRATE 1 MG PO TABS: 1.0000 mg | ORAL_TABLET | Freq: Two times a day (BID) | ORAL | Status: DC

## 2013-10-21 NOTE — Patient Instructions (Signed)
Glipizide: You can take 20mg  in the morning (2 tablets), and 10mg  at night (1 tablet)

## 2013-10-21 NOTE — Progress Notes (Signed)
Patient ID: Theresa [name], female   DOB: [birth year], 48 y.o.   MRN: [id number]   Subjective:    Patient ID: Theresa [name], female    DOB: March 21, 1965, 48 y.o.   MRN: [id number]  HPI  CC: diabetes  # Diabetes:  Reports this morning fasting sugar 107  Over the weekend had sugar up to 500, got scared and called after hours line, told to take 2 glipizide twice a day instead of 1 and has been doing that for the past few days  Seen by eye doctor ROS: vision worsening, no polyuria, no dysuria  # Tobacco abuse  Has been taking chantix since approx 1 year ago? Pt can't recall when she started on this  Prescribed 0.5mg  BID, but sometimes takes 2  Still smoking up to 1 ppd ROS: no cough  # Hypertension  Reports being compliant with medications ROS: no CP, no SOB, no headaches  Review of Systems   See HPI for ROS. Objective:  BP 122/83  Pulse 73  Temp(Src) 98.2 F (36.8 C) (Oral)  Ht 5\' 2"  (1.575 m)  Wt 224 lb 11.2 oz (101.923 kg)  BMI 41.09 kg/m2  General: NAD Cardiac: RRR, normal heart sounds, no murmurs. 2+ radial and PT pulses bilaterally Respiratory: CTAB, normal effort Extremities: no edema or cyanosis. WWP. Skin: warm and dry, no rashes noted Neuro: alert and oriented, no focal deficits     Assessment & Plan:  See Problem List Documentation

## 2013-10-21 NOTE — Assessment & Plan Note (Signed)
A1c 7.5 today. On chart review reports to other provider that she doesn't think the glipizide is working, however when asked today she does not state this. She reports no hypoglycemic episodes while taking 20mg  glipizide twice a day.  Plan: increase glipizide to 20mg  in AM, keep at 10mg  in PM. F/u 3 months.

## 2013-10-21 NOTE — Assessment & Plan Note (Signed)
At goal. Plan: no changes to current regimen.

## 2013-10-21 NOTE — Assessment & Plan Note (Signed)
Unclear how long she is on Chantix for, but she is not at maintenance dose of 1mg  and patient states she occasionally increases to this without issue (takes because she thinks 0.5mg  doesn't suppress). Does not report any adverse side effects Plan: refill for chantix 1mg  tablets for 1 month.

## 2013-11-04 ENCOUNTER — Telehealth: Payer: Self-pay | Admitting: Home Health Services

## 2013-11-04 ENCOUNTER — Other Ambulatory Visit: Payer: Self-pay | Admitting: Neurological Surgery

## 2013-11-04 DIAGNOSIS — M5489 Other dorsalgia: Secondary | ICD-10-CM

## 2013-11-04 NOTE — Telephone Encounter (Signed)
Called Dr Einar Gip for diabetic eye exam report

## 2013-11-04 NOTE — Telephone Encounter (Signed)
Per member had appt with Dr Einar Gip.  Call for report

## 2013-11-05 ENCOUNTER — Ambulatory Visit (HOSPITAL_BASED_OUTPATIENT_CLINIC_OR_DEPARTMENT_OTHER): Payer: Medicare Other

## 2013-11-05 VITALS — BP 123/86 | HR 72

## 2013-11-05 DIAGNOSIS — Z452 Encounter for adjustment and management of vascular access device: Secondary | ICD-10-CM

## 2013-11-05 DIAGNOSIS — C3410 Malignant neoplasm of upper lobe, unspecified bronchus or lung: Secondary | ICD-10-CM

## 2013-11-05 DIAGNOSIS — Z95828 Presence of other vascular implants and grafts: Secondary | ICD-10-CM

## 2013-11-05 MED ORDER — SODIUM CHLORIDE 0.9 % IJ SOLN
10.0000 mL | INTRAMUSCULAR | Status: DC | PRN
Start: 2013-11-05 — End: 2013-11-05
  Administered 2013-11-05: 10 mL via INTRAVENOUS
  Filled 2013-11-05: qty 10

## 2013-11-05 MED ORDER — HEPARIN SOD (PORK) LOCK FLUSH 100 UNIT/ML IV SOLN
500.0000 [IU] | Freq: Once | INTRAVENOUS | Status: AC
  Administered 2013-11-05: 500 [IU] via INTRAVENOUS
  Filled 2013-11-05: qty 5

## 2013-11-08 ENCOUNTER — Ambulatory Visit
Admission: RE | Admit: 2013-11-08 | Discharge: 2013-11-08 | Disposition: A | Discharge status: Home / Self Care | Payer: Medicare Other | Source: Ambulatory Visit | Attending: Neurological Surgery | Admitting: Neurological Surgery

## 2013-11-08 DIAGNOSIS — M5489 Other dorsalgia: Secondary | ICD-10-CM

## 2013-11-12 ENCOUNTER — Ambulatory Visit (HOSPITAL_COMMUNITY)
Admission: RE | Admit: 2013-11-12 | Discharge: 2013-11-12 | Disposition: A | Discharge status: Home / Self Care | Payer: Medicare Other | Source: Ambulatory Visit | Attending: Internal Medicine | Admitting: Internal Medicine

## 2013-11-12 ENCOUNTER — Other Ambulatory Visit (HOSPITAL_BASED_OUTPATIENT_CLINIC_OR_DEPARTMENT_OTHER): Payer: Medicare Other

## 2013-11-12 ENCOUNTER — Encounter (HOSPITAL_COMMUNITY): Payer: Self-pay

## 2013-11-12 DIAGNOSIS — M546 Pain in thoracic spine: Secondary | ICD-10-CM | POA: Diagnosis not present

## 2013-11-12 DIAGNOSIS — R05 Cough: Secondary | ICD-10-CM | POA: Diagnosis not present

## 2013-11-12 DIAGNOSIS — C3490 Malignant neoplasm of unspecified part of unspecified bronchus or lung: Secondary | ICD-10-CM | POA: Diagnosis present

## 2013-11-12 DIAGNOSIS — Z923 Personal history of irradiation: Secondary | ICD-10-CM | POA: Diagnosis not present

## 2013-11-12 DIAGNOSIS — Z9221 Personal history of antineoplastic chemotherapy: Secondary | ICD-10-CM | POA: Insufficient documentation

## 2013-11-12 DIAGNOSIS — R059 Cough, unspecified: Secondary | ICD-10-CM | POA: Insufficient documentation

## 2013-11-12 DIAGNOSIS — C3410 Malignant neoplasm of upper lobe, unspecified bronchus or lung: Secondary | ICD-10-CM

## 2013-11-12 LAB — COMPREHENSIVE METABOLIC PANEL (CC13)
ALT: 18 U/L (ref 0–55)
AST: 12 U/L (ref 5–34)
Albumin: 4 g/dL (ref 3.5–5.0)
Alkaline Phosphatase: 53 U/L (ref 40–150)
Anion Gap: 8 mEq/L (ref 3–11)
BUN: 8.8 mg/dL (ref 7.0–26.0)
CO2: 28 mEq/L (ref 22–29)
Calcium: 9.3 mg/dL (ref 8.4–10.4)
Chloride: 105 mEq/L (ref 98–109)
Creatinine: 1.3 mg/dL — ABNORMAL HIGH (ref 0.6–1.1)
Glucose: 134 mg/dl (ref 70–140)
Potassium: 3.4 mEq/L — ABNORMAL LOW (ref 3.5–5.1)
Sodium: 140 mEq/L (ref 136–145)
Total Bilirubin: 0.77 mg/dL (ref 0.20–1.20)
Total Protein: 7.3 g/dL (ref 6.4–8.3)

## 2013-11-12 LAB — CBC WITH DIFFERENTIAL/PLATELET
BASO%: 0.4 % (ref 0.0–2.0)
Basophils Absolute: 0 10*3/uL (ref 0.0–0.1)
EOS%: 4.6 % (ref 0.0–7.0)
Eosinophils Absolute: 0.3 10*3/uL (ref 0.0–0.5)
HCT: 49.9 % — ABNORMAL HIGH (ref 34.8–46.6)
HGB: 16.8 g/dL — ABNORMAL HIGH (ref 11.6–15.9)
LYMPH%: 20 % (ref 14.0–49.7)
MCH: 30.4 pg (ref 25.1–34.0)
MCHC: 33.7 g/dL (ref 31.5–36.0)
MCV: 90.4 fL (ref 79.5–101.0)
MONO#: 0.6 10*3/uL (ref 0.1–0.9)
MONO%: 8.4 % (ref 0.0–14.0)
NEUT#: 4.5 10*3/uL (ref 1.5–6.5)
NEUT%: 66.6 % (ref 38.4–76.8)
Platelets: 181 10*3/uL (ref 145–400)
RBC: 5.52 10*6/uL — ABNORMAL HIGH (ref 3.70–5.45)
RDW: 13.2 % (ref 11.2–14.5)
WBC: 6.8 10*3/uL (ref 3.9–10.3)
lymph#: 1.4 10*3/uL (ref 0.9–3.3)

## 2013-11-12 MED ORDER — IOHEXOL 300 MG/ML  SOLN
80.0000 mL | Freq: Once | INTRAMUSCULAR | Status: AC | PRN
  Administered 2013-11-12: 80 mL via INTRAVENOUS

## 2013-11-19 ENCOUNTER — Telehealth: Payer: Self-pay | Admitting: Internal Medicine

## 2013-11-19 ENCOUNTER — Ambulatory Visit (HOSPITAL_BASED_OUTPATIENT_CLINIC_OR_DEPARTMENT_OTHER): Payer: Medicare Other | Admitting: Internal Medicine

## 2013-11-19 VITALS — BP 132/84 | HR 78 | Temp 98.4°F | Resp 18 | Ht 62.0 in | Wt 228.0 lb

## 2013-11-19 DIAGNOSIS — F17200 Nicotine dependence, unspecified, uncomplicated: Secondary | ICD-10-CM

## 2013-11-19 DIAGNOSIS — I10 Essential (primary) hypertension: Secondary | ICD-10-CM

## 2013-11-19 DIAGNOSIS — C3490 Malignant neoplasm of unspecified part of unspecified bronchus or lung: Secondary | ICD-10-CM

## 2013-11-19 NOTE — Progress Notes (Signed)
[facility] [phone]   Fax:([phone]) [place], MD [place] [address]  DIAGNOSIS: Recurrent non-small cell lung cancer initially diagnosed as stage IA (T1b, N0, MX) adenocarcinoma in November 2009.   PRIOR THERAPY:  #1 Status post left upper lobe trisegmentectomy with lymph node dissection under the care of Dr. Arlyce Dice on March 24, 2009.  #2 Concurrent chemoradiation with carboplatin for AUC of 2 and paclitaxel 45 mg/M2 given weekly with radiation.   CURRENT THERAPY: Observation.  CHEMOTHERAPY INTENT: Curative  CURRENT # OF CHEMOTHERAPY CYCLES: 0  CURRENT ANTIEMETICS: None  CURRENT SMOKING STATUS: Current smoker and I strongly encouraged her to quit smoking and altered her to smoke cessation program.  ORAL CHEMOTHERAPY AND CONSENT: None  CURRENT BISPHOSPHONATES USE: None  PAIN MANAGEMENT: 0/10  NARCOTICS INDUCED CONSTIPATION: None  LIVING WILL AND CODE STATUS: No CODE BLUE   INTERVAL HISTORY: Theresa [name] 48 y.o. female returns to the clinic today for six-month followup visit. She has been observation for the last few years and feeling well. The patient is feeling fine today with no specific complaints. She has no chest pain, shortness of breath, cough or hemoptysis. She denied having any significant fever or chills, no nausea or vomiting. No significant weight loss or night sweats. The patient denied having any fatigue or weakness. She had repeat CT scan of the chest performed recently and she is here for evaluation and discussion of her scan results.  MEDICAL HISTORY: Past Medical History  Diagnosis Date  . Hypertension   . Hyperlipidemia   . Blindness, legal     glaucoma and retinitis  pigmentosa  . Glaucoma   . Onychomycosis   . Retinitis pigmentosa   . Chronic eczema   . Diabetes mellitus type II     diet controlled  . Anxiety   . Hx of radiation therapy 01-17-11 to 03-18-11     lung  . History of chemotherapy 01/2011 to 03/2011    concurrent w/radiation therapy  . Chronic headaches 10-17-11    migraines  . Sleep apnea     cpap 5 yrs  . Asthma   . GERD (gastroesophageal reflux disease)   . Arthritis   . Fibromyalgia     denies  . Family history of anesthesia complication     1 son had malignant hyperthermia at 67yrs  tonsils rained  . Family history of malignant hyperthermia     1 son 16 years ago  . Lung cancer     lung ca dx 2010    ALLERGIES:  is allergic to morphine and related; codeine; penicillins; and tomato.  MEDICATIONS:  Current Outpatient Prescriptions  Medication Sig Dispense Refill  . albuterol (PROVENTIL HFA;VENTOLIN HFA) 108 (90 BASE) MCG/ACT inhaler Inhale 2 puffs into the lungs every 6 (six) hours as needed for wheezing or shortness of breath.  1 Inhaler  0  . amLODipine (NORVASC) 10 MG tablet Take 1 tablet (10 mg total) by mouth every morning.  90 tablet  0  . atorvastatin (LIPITOR) 40 MG tablet Take 1 tablet (40 mg total) by mouth daily.  90 tablet  0  . brimonidine (ALPHAGAN) 0.15 % ophthalmic solution Place 1 drop into both eyes 2 (two) times daily.      . carisoprodol (SOMA) 350 MG tablet Take 1 tablet (350 mg total) by mouth 3 (three) times daily.  30 tablet  0  . carvedilol (COREG)  25 MG tablet Take 1 tablet (25 mg total) by mouth 2 (two) times daily with a meal.  180 tablet  0  . clobetasol cream (TEMOVATE) 1.75 % Apply 1 application topically daily.  45 g  0  . fluticasone (FLONASE) 50 MCG/ACT nasal spray Place 2 sprays into both nostrils daily.  16 g  6  . gabapentin (NEURONTIN) 300 MG capsule Take 1 capsule (300 mg total) by mouth 3 (three) times daily.  270 capsule  0  . glipiZIDE (GLUCOTROL) 10 MG tablet Take 1 tablet (10 mg total) by mouth 2 (two) times daily before a meal.  180 tablet  3  . ipratropium (ATROVENT) 0.06 % nasal spray Place 2 sprays into both nostrils 4 (four) times daily.  15 mL  12  . omeprazole (PRILOSEC)  40 MG capsule Take 1 capsule (40 mg total) by mouth daily.  90 capsule  3  . oxyCODONE-acetaminophen (PERCOCET/ROXICET) 5-325 MG per tablet Take 1-2 tablets by mouth every 6 (six) hours as needed for moderate pain.  90 tablet  0  . potassium chloride SA (K-DUR,KLOR-CON) 20 MEQ tablet Take 1 tablet (20 mEq total) by mouth daily.  90 tablet  3  . valsartan-hydrochlorothiazide (DIOVAN-HCT) 160-12.5 MG per tablet Take 1 tablet by mouth daily.      . varenicline (CHANTIX) 1 MG tablet Take 1 tablet (1 mg total) by mouth 2 (two) times daily.  60 tablet  0   No current facility-administered medications for this visit.    SURGICAL HISTORY:  Past Surgical History  Procedure Laterality Date  . Lung surgery  03/24/08    L vats, L thoracotomy and LUL trisegmentectomy with node dissection  . Chest tube insertion   04/01/08    L hydropneumothorax  . Back surgery    . Portacath placement  02/11/2011    Procedure: INSERTION PORT-A-CATH;  Surgeon: Pierre Bali, MD;  Location: [facility];  Service: Thoracic;  Laterality: Right;  9.6Fr. Pre-attached Power Port in Right Internal Jugular  -right chest-remains inplace 10-17-11  . Knee arthroscopy  10-17-11    bil. knee scope(left was torn ligament)  . Lumbar laminectomy/decompression microdiscectomy  10/27/2011    Procedure: LUMBAR LAMINECTOMY/DECOMPRESSION MICRODISCECTOMY;  Surgeon: Johnn Hai, MD;  Location: [facility];  Service: Orthopedics;  Laterality: N/A;  L4-L5  . Abdominal hysterectomy  09/19/2001    hx. of fibroids. TAH. Ovaries remain.   . Eye surgery Bilateral     lens implant  . Maximum access (mas)posterior lumbar interbody fusion (plif) 1 level N/A 07/03/2013    Procedure: FOR MAXIMUM ACCESS (MAS) POSTERIOR LUMBAR INTERBODY FUSION (PLIF) 1 LEVEL;  Surgeon: Eustace Moore, MD;  Location: [facility];  Service: Neurosurgery;  Laterality: N/A;  FOR MAXIMUM ACCESS (MAS) POSTERIOR LUMBAR INTERBODY FUSION (PLIF) 1 LEVEL (L4-L5)    REVIEW OF SYSTEMS:  A  comprehensive review of systems was negative.   PHYSICAL EXAMINATION: General appearance: alert, cooperative and no distress Head: Normocephalic, without obvious abnormality, atraumatic Neck: no adenopathy Lymph nodes: Cervical, supraclavicular, and axillary nodes normal. Resp: clear to auscultation bilaterally Cardio: regular rate and rhythm, S1, S2 normal, no murmur, click, rub or gallop GI: soft, non-tender; bowel sounds normal; no masses,  no organomegaly Extremities: extremities normal, atraumatic, no cyanosis or edema  ECOG PERFORMANCE STATUS: 1 - Symptomatic but completely ambulatory  Blood pressure 132/84, pulse 78, temperature 98.4 F (36.9 C), temperature source Oral, resp. rate 18, height 5\' 2"  (1.575 m), weight 228 lb (103.42 kg),  SpO2 98.00%.  LABORATORY DATA: Lab Results  Component Value Date   WBC 6.8 11/12/2013   HGB 16.8* 11/12/2013   HCT 49.9* 11/12/2013   MCV 90.4 11/12/2013   PLT 181 11/12/2013      Chemistry      Component Value Date/Time   NA 140 11/12/2013 0917   NA 141 06/25/2013 1135   NA 141 07/14/2011 0934   K 3.4* 11/12/2013 0917   K 3.9 06/25/2013 1135   K 3.4 07/14/2011 0934   CL 100 06/25/2013 1135   CL 102 01/18/2012 0805   CL 100 07/14/2011 0934   CO2 28 11/12/2013 0917   CO2 26 06/25/2013 1135   CO2 30 07/14/2011 0934   BUN 8.8 11/12/2013 0917   BUN 14 06/25/2013 1135   BUN 13 07/14/2011 0934   CREATININE 1.3* 11/12/2013 0917   CREATININE 1.37* 06/25/2013 1135   CREATININE 1.45* 12/21/2012 0909      Component Value Date/Time   CALCIUM 9.3 11/12/2013 0917   CALCIUM 9.4 06/25/2013 1135   CALCIUM 8.7 07/14/2011 0934   ALKPHOS 53 11/12/2013 0917   ALKPHOS 62 10/25/2012 0953   ALKPHOS 38 07/14/2011 0934   AST 12 11/12/2013 0917   AST 10 10/25/2012 0953   AST 19 07/14/2011 0934   ALT 18 11/12/2013 0917   ALT 17 10/25/2012 0953   ALT 20 07/14/2011 0934   BILITOT 0.77 11/12/2013 0917   BILITOT 1.2 10/25/2012 0953   BILITOT 1.20 07/14/2011 0934       RADIOGRAPHIC STUDIES: Ct Chest W  Contrast  11/12/2013   CLINICAL DATA:  Lung cancer diagnosed in 2010 with chemotherapy and radiation therapy complete. Left upper back pain with cough.  EXAM: CT CHEST WITH CONTRAST  TECHNIQUE: Multidetector CT imaging of the chest was performed during intravenous contrast administration.  CONTRAST:  37mL OMNIPAQUE IOHEXOL 300 MG/ML  SOLN  COMPARISON:  Chest CT 05/01/2013.  Plain film chest of 06/25/2013  FINDINGS: Lungs/Pleura: Left-sided surgical changes, at least partial left upper lobectomy.  Presumed radiation changes within the apex of the left hemi thorax. These are unchanged.  No pleural fluid. There is minimal left apical pleural thickening, unchanged.  Heart/Mediastinum: No supraclavicular adenopathy. Probable mixing phenomenon in the right jugular vein on image 1. Nonspecific sub cm right-sided thyroid nodule. Normal heart size, without pericardial effusion. Right-sided Port-A-Cath which terminates in the mid right atrium on this supine exam. Stable small middle mediastinal nodes, without mediastinal or hilar adenopathy.  Upper Abdomen: Normal imaged portions of the liver, spleen, pancreas, gallbladder, adrenal glands. Low-density left renal lesions which are too small to characterize but likely cysts. The stomach appears mildly thick-walled, favored to be due to underdistention.  Bones/Musculoskeletal:  No acute osseous abnormality.  IMPRESSION: Surgical and presumed radiation changes in the left hemi thorax, without locally recurrent or metastatic disease.   Electronically Signed   By: Abigail Miyamoto M.D.   On: 11/12/2013 13:33   Ct Lumbar Spine Wo Contrast  11/08/2013   CLINICAL DATA:  Left-sided low back pain for 2 months. L4-5 fusion in April 2015. History of lung cancer. The patient also fell 10 days ago.  EXAM: CT LUMBAR SPINE WITHOUT CONTRAST  TECHNIQUE: Multidetector CT imaging of the lumbar spine was performed without intravenous contrast administration. Multiplanar CT image reconstructions  were also generated.  COMPARISON:  Radiographs dated 10/28/2013 and lumbar MRI dated 05/16/2013 and CT scan of the abdomen dated 05/06/2009  FINDINGS: T11-12 through L2-3:  Normal.  L3-4: Normal  disc. Mild to moderate bilateral facet arthritis. No neural impingement.  L4-5: Solid interbody fusion. Pedicle screws in good position with no evidence of loosening. Prominent soft tissue surrounds the thecal sac asymmetric to the left extend into the left neural foramen. This probably represents postsurgical scarring.  L5-S1: The disc is normal. Moderate right and mild left facet arthritis.  IMPRESSION: 1. Facet arthritis at L3-4 and L5-S1. 2. Solid fusion at L4-5. Scarring around the thecal sac slightly asymmetric to the left.   Electronically Signed   By: Rozetta Nunnery M.D.   On: 11/08/2013 11:54   ASSESSMENT AND PLAN: This is a very pleasant 48 years old [place] American female with recurrent non-small cell lung cancer status post concurrent chemoradiation and currently on observation with no evidence for disease progression. I discussed the scan results with the patient. I recommended for her to continue on observation with repeat CT scan of the chest in 6 months. For smoke cessation, I strongly encouraged the patient to quit smoking and offered her smoke cessation program. She was advised to call immediately if she has any concerning symptoms in the interval. The patient voices understanding of current disease status and treatment options and is in agreement with the current care plan.  All questions were answered. The patient knows to call the clinic with any problems, questions or concerns. We can certainly see the patient much sooner if necessary.  Disclaimer: This note was dictated with voice recognition software. Similar sounding words can inadvertently be transcribed and may not be corrected upon review.

## 2013-11-19 NOTE — Telephone Encounter (Signed)
gv pt appt schedule for oct 2015 thru march 2016 - added flush appts. central will call pt w/ct appt.

## 2013-11-21 ENCOUNTER — Encounter (HOSPITAL_COMMUNITY): Payer: Self-pay | Admitting: Pharmacy Technician

## 2013-11-21 ENCOUNTER — Other Ambulatory Visit: Payer: Self-pay

## 2013-11-21 DIAGNOSIS — C3490 Malignant neoplasm of unspecified part of unspecified bronchus or lung: Secondary | ICD-10-CM

## 2013-11-22 ENCOUNTER — Encounter: Payer: Self-pay | Admitting: Family Medicine

## 2013-11-22 ENCOUNTER — Telehealth: Payer: Self-pay | Admitting: Family Medicine

## 2013-11-22 ENCOUNTER — Ambulatory Visit (INDEPENDENT_AMBULATORY_CARE_PROVIDER_SITE_OTHER): Payer: Medicare Other | Admitting: Family Medicine

## 2013-11-22 VITALS — BP 134/86 | HR 80 | Temp 98.3°F | Ht 61.0 in | Wt 227.3 lb

## 2013-11-22 DIAGNOSIS — R1013 Epigastric pain: Secondary | ICD-10-CM

## 2013-11-22 DIAGNOSIS — D751 Secondary polycythemia: Secondary | ICD-10-CM

## 2013-11-22 MED ORDER — PANTOPRAZOLE SODIUM 40 MG PO TBEC: 40.0000 mg | DELAYED_RELEASE_TABLET | Freq: Two times a day (BID) | ORAL | Status: DC

## 2013-11-22 NOTE — Telephone Encounter (Signed)
Pt seen by Dr. Darene Lamer this am. Had to leave earlier due to SCAT but would like to speak to Dr. Darene Lamer, if possible.

## 2013-11-22 NOTE — Progress Notes (Signed)
Patient ID: Theresa [name], female   DOB: [birth year], 48 y.o.   MRN: [id number] Subjective:   CC: Discuss need for blood thinner  HPI:   Discuss cancer and need for blood thinner Patient is a 48 y.o. female with h/o nonsmall cell lung cancer here because she thinks she was told by her oncologist she needed to be on a blood thinner due to her cancer. On review, she has polycythemia and on discussion with Dr Julien Nordmann she could consider intermittent blood donation at the [facility] and a baby aspirin, which she is already on.   Abdominal pain She also reports pain especially when she eats and coughs that feels like reflux. Pain is epigastric and upper left and is sharp. Pain present for 2 months. Vague description of quality and onset. Constant. 10/10 if she coughs. Denies vomiting but has had mild nausea. Pain moves into back. Able to eat and drink with no dysphagia. Also feels constipated. She is taking omeprazole 40mg  TID before meals with no relief.    Review of Systems - Per HPI. Additionally, headache, neck and shoulder pain, knee arthritis, numbness/tingling in left neck, shoulder, and leg. Denies inability to use arms or legs.  SH:  Smokes cigarettes "every now and then"    Objective:  Physical Exam BP 134/86  Pulse 80  Temp(Src) 98.3 F (36.8 C) (Oral)  Ht 5\' 1"  (1.549 m)  Wt 227 lb 4.8 oz (103.103 kg)  BMI 42.97 kg/m2 GEN: NAD, pleasant CV: RRR, no m/r/g PULM: CTAB, normal effort ABD: Soft, tender epigastric, LUQ and LLQ mild, nondistended, no obvious organomegaly EXTR: No LEE     Assessment:     Theresa [name] is a 48 y.o. female with h/o non small cell lung cancer here for abdominal pain and question about polycythemia.    Plan:     Abdominal pain Exam with mild abdominal tenderness and vague symptoms that are subacute. No fever/chills or emesis and pt is passing stool though hard.  - Miralax - Ordered h pylori, lipase, cmet but patient had to leave to catch her ride  prior to getting labs drawn. Will call her to come get labs. - Changed PPI to pantoprazole 40mg  BID x 2 weeks. - F/u in 1-2 weeks for further eval if no improvement. At that time could consider EGD to eval for ulcer. - Continue her current laxative.  Discuss polycythemia Likely related to h/o lung cancer. - Will inform patient to continue her baby aspirin and occasionally donate blood.  Follow-up: Follow up in 1-2 weeks for f/u of abdominal pain. F/u with PCP to discuss left sided tingling and pain.  Precepted with Dr Ree Kida.  Hilton Sinclair, MD [place]

## 2013-11-24 DIAGNOSIS — D751 Secondary polycythemia: Secondary | ICD-10-CM | POA: Insufficient documentation

## 2013-11-24 NOTE — Assessment & Plan Note (Signed)
Exam with mild abdominal tenderness and vague symptoms that are subacute. No fever/chills or emesis and pt is passing stool though hard.  - Miralax - Ordered h pylori, lipase, cmet but patient had to leave to catch her ride prior to getting labs drawn. Will call her to come get labs. - Changed PPI to pantoprazole 40mg  BID x 2 weeks. - F/u in 1-2 weeks for further eval if no improvement. At that time could consider EGD to eval for ulcer. - Continue her current laxative.

## 2013-11-24 NOTE — Assessment & Plan Note (Signed)
Likely related to h/o lung cancer. - Will inform patient to continue her baby aspirin and occasionally donate blood.

## 2013-11-25 ENCOUNTER — Encounter (HOSPITAL_COMMUNITY): Payer: Self-pay | Admitting: *Deleted

## 2013-11-25 MED ORDER — VANCOMYCIN HCL 10 G IV SOLR
1500.0000 mg | INTRAVENOUS | Status: AC
  Administered 2013-11-26: 1500 mg via INTRAVENOUS
  Filled 2013-11-25: qty 1500

## 2013-11-25 NOTE — Progress Notes (Addendum)
Theresa [name] denies chest pain, denies seeing a cardiologist.  Patient has a stress test  01/30/13- due to shortness of breath and chest tightness.  Stress test was low risk.  [name]' son has Malignant Hyperthermia 13 when he was 7, he is now 32.  Patient denies any problems with anesthesia herself.  Dr Marin Comment notified.

## 2013-11-25 NOTE — Telephone Encounter (Signed)
Called and discussed with her my plan from our last visit.  Hilton Sinclair, MD

## 2013-11-26 ENCOUNTER — Encounter (HOSPITAL_COMMUNITY): Payer: Medicare Other | Admitting: Anesthesiology

## 2013-11-26 ENCOUNTER — Encounter (HOSPITAL_COMMUNITY): Payer: Self-pay | Admitting: *Deleted

## 2013-11-26 ENCOUNTER — Ambulatory Visit (HOSPITAL_COMMUNITY): Payer: Medicare Other | Admitting: Anesthesiology

## 2013-11-26 ENCOUNTER — Encounter (HOSPITAL_COMMUNITY)
Admission: RE | Disposition: A | Discharge status: Home / Self Care | Payer: Self-pay | Source: Ambulatory Visit | Attending: Surgery

## 2013-11-26 ENCOUNTER — Ambulatory Visit (HOSPITAL_COMMUNITY)
Admission: RE | Admit: 2013-11-26 | Discharge: 2013-11-26 | Disposition: A | Discharge status: Home / Self Care | Payer: Medicare Other | Source: Ambulatory Visit | Attending: Surgery | Admitting: Surgery

## 2013-11-26 ENCOUNTER — Ambulatory Visit (HOSPITAL_COMMUNITY): Payer: Medicare Other

## 2013-11-26 DIAGNOSIS — H3552 Pigmentary retinal dystrophy: Secondary | ICD-10-CM | POA: Insufficient documentation

## 2013-11-26 DIAGNOSIS — F17200 Nicotine dependence, unspecified, uncomplicated: Secondary | ICD-10-CM | POA: Diagnosis not present

## 2013-11-26 DIAGNOSIS — H409 Unspecified glaucoma: Secondary | ICD-10-CM | POA: Insufficient documentation

## 2013-11-26 DIAGNOSIS — B351 Tinea unguium: Secondary | ICD-10-CM | POA: Diagnosis not present

## 2013-11-26 DIAGNOSIS — Z452 Encounter for adjustment and management of vascular access device: Secondary | ICD-10-CM | POA: Diagnosis not present

## 2013-11-26 DIAGNOSIS — K219 Gastro-esophageal reflux disease without esophagitis: Secondary | ICD-10-CM | POA: Diagnosis not present

## 2013-11-26 DIAGNOSIS — M1990 Unspecified osteoarthritis, unspecified site: Secondary | ICD-10-CM | POA: Diagnosis not present

## 2013-11-26 DIAGNOSIS — C3490 Malignant neoplasm of unspecified part of unspecified bronchus or lung: Secondary | ICD-10-CM

## 2013-11-26 DIAGNOSIS — Z90710 Acquired absence of both cervix and uterus: Secondary | ICD-10-CM | POA: Insufficient documentation

## 2013-11-26 DIAGNOSIS — N189 Chronic kidney disease, unspecified: Secondary | ICD-10-CM | POA: Insufficient documentation

## 2013-11-26 DIAGNOSIS — G4730 Sleep apnea, unspecified: Secondary | ICD-10-CM | POA: Diagnosis not present

## 2013-11-26 DIAGNOSIS — E119 Type 2 diabetes mellitus without complications: Secondary | ICD-10-CM | POA: Diagnosis not present

## 2013-11-26 DIAGNOSIS — I129 Hypertensive chronic kidney disease with stage 1 through stage 4 chronic kidney disease, or unspecified chronic kidney disease: Secondary | ICD-10-CM | POA: Insufficient documentation

## 2013-11-26 DIAGNOSIS — F341 Dysthymic disorder: Secondary | ICD-10-CM | POA: Diagnosis not present

## 2013-11-26 DIAGNOSIS — Z923 Personal history of irradiation: Secondary | ICD-10-CM | POA: Diagnosis not present

## 2013-11-26 DIAGNOSIS — Z85118 Personal history of other malignant neoplasm of bronchus and lung: Secondary | ICD-10-CM | POA: Insufficient documentation

## 2013-11-26 DIAGNOSIS — R51 Headache: Secondary | ICD-10-CM | POA: Diagnosis not present

## 2013-11-26 DIAGNOSIS — J45909 Unspecified asthma, uncomplicated: Secondary | ICD-10-CM | POA: Diagnosis not present

## 2013-11-26 DIAGNOSIS — Z9221 Personal history of antineoplastic chemotherapy: Secondary | ICD-10-CM | POA: Diagnosis not present

## 2013-11-26 DIAGNOSIS — L259 Unspecified contact dermatitis, unspecified cause: Secondary | ICD-10-CM | POA: Diagnosis not present

## 2013-11-26 DIAGNOSIS — H548 Legal blindness, as defined in USA: Secondary | ICD-10-CM | POA: Diagnosis not present

## 2013-11-26 DIAGNOSIS — E785 Hyperlipidemia, unspecified: Secondary | ICD-10-CM | POA: Insufficient documentation

## 2013-11-26 HISTORY — DX: Major depressive disorder, single episode, unspecified: F32.9

## 2013-11-26 HISTORY — DX: Shortness of breath: R06.02

## 2013-11-26 HISTORY — DX: Constipation, unspecified: K59.00

## 2013-11-26 HISTORY — PX: PORT-A-CATH REMOVAL: SHX5289

## 2013-11-26 HISTORY — DX: Depression, unspecified: F32.A

## 2013-11-26 LAB — CBC
HCT: 49.1 % — ABNORMAL HIGH (ref 36.0–46.0)
Hemoglobin: 16.7 g/dL — ABNORMAL HIGH (ref 12.0–15.0)
MCH: 30.5 pg (ref 26.0–34.0)
MCHC: 34 g/dL (ref 30.0–36.0)
MCV: 89.6 fL (ref 78.0–100.0)
Platelets: 212 10*3/uL (ref 150–400)
RBC: 5.48 MIL/uL — ABNORMAL HIGH (ref 3.87–5.11)
RDW: 13.2 % (ref 11.5–15.5)
WBC: 6.9 10*3/uL (ref 4.0–10.5)

## 2013-11-26 LAB — COMPREHENSIVE METABOLIC PANEL
ALT: 17 U/L (ref 0–35)
AST: 14 U/L (ref 0–37)
Albumin: 3.9 g/dL (ref 3.5–5.2)
Alkaline Phosphatase: 69 U/L (ref 39–117)
Anion gap: 15 (ref 5–15)
BUN: 13 mg/dL (ref 6–23)
CO2: 24 mEq/L (ref 19–32)
Calcium: 9.3 mg/dL (ref 8.4–10.5)
Chloride: 101 mEq/L (ref 96–112)
Creatinine, Ser: 1.3 mg/dL — ABNORMAL HIGH (ref 0.50–1.10)
GFR calc Af Amer: 54 mL/min — ABNORMAL LOW (ref >90)
GFR calc non Af Amer: 47 mL/min — ABNORMAL LOW (ref >90)
Glucose, Bld: 202 mg/dL — ABNORMAL HIGH (ref 70–99)
Potassium: 3.9 mEq/L (ref 3.7–5.3)
Sodium: 140 mEq/L (ref 137–147)
Total Bilirubin: 0.6 mg/dL (ref 0.3–1.2)
Total Protein: 7.1 g/dL (ref 6.0–8.3)

## 2013-11-26 LAB — GLUCOSE, CAPILLARY
Glucose-Capillary: 204 mg/dL — ABNORMAL HIGH (ref 70–99)
Glucose-Capillary: 148 mg/dL — ABNORMAL HIGH (ref 70–99)

## 2013-11-26 LAB — APTT: aPTT: 29 seconds (ref 24–37)

## 2013-11-26 LAB — PROTIME-INR
INR: 1.03 (ref 0.00–1.49)
Prothrombin Time: 13.5 seconds (ref 11.6–15.2)

## 2013-11-26 SURGERY — REMOVAL PORT-A-CATH
Anesthesia: Monitor Anesthesia Care | Site: Chest | Laterality: Right

## 2013-11-26 MED ORDER — SODIUM CHLORIDE 0.9 % IV SOLN
INTRAVENOUS | Status: DC | PRN
  Administered 2013-11-26: 09:00:00 via INTRAVENOUS

## 2013-11-26 MED ORDER — LACTATED RINGERS IV SOLN
INTRAVENOUS | Status: DC
  Administered 2013-11-26: 09:00:00 via INTRAVENOUS

## 2013-11-26 MED ORDER — CARVEDILOL 12.5 MG PO TABS
ORAL_TABLET | ORAL | Status: AC
  Filled 2013-11-26: qty 2

## 2013-11-26 MED ORDER — FENTANYL CITRATE 0.05 MG/ML IJ SOLN
INTRAMUSCULAR | Status: DC | PRN
  Administered 2013-11-26 (×2): 25 ug via INTRAVENOUS

## 2013-11-26 MED ORDER — PROPOFOL INFUSION 10 MG/ML OPTIME
INTRAVENOUS | Status: DC | PRN
  Administered 2013-11-26: 50 ug/kg/min via INTRAVENOUS

## 2013-11-26 MED ORDER — NEOSTIGMINE METHYLSULFATE 10 MG/10ML IV SOLN
INTRAVENOUS | Status: AC
  Filled 2013-11-26: qty 1

## 2013-11-26 MED ORDER — PROPOFOL 10 MG/ML IV BOLUS
INTRAVENOUS | Status: DC | PRN
  Administered 2013-11-26: 20 mg via INTRAVENOUS

## 2013-11-26 MED ORDER — MIDAZOLAM HCL 5 MG/5ML IJ SOLN
INTRAMUSCULAR | Status: DC | PRN
  Administered 2013-11-26 (×2): 1 mg via INTRAVENOUS

## 2013-11-26 MED ORDER — GLYCOPYRROLATE 0.2 MG/ML IJ SOLN
INTRAMUSCULAR | Status: AC
  Filled 2013-11-26: qty 2

## 2013-11-26 MED ORDER — MIDAZOLAM HCL 2 MG/2ML IJ SOLN
INTRAMUSCULAR | Status: AC
  Filled 2013-11-26: qty 2

## 2013-11-26 MED ORDER — OXYCODONE-ACETAMINOPHEN 7.5-325 MG PO TABS: 1.0000 | ORAL_TABLET | ORAL | Status: DC | PRN

## 2013-11-26 MED ORDER — LIDOCAINE-EPINEPHRINE (PF) 1 %-1:200000 IJ SOLN
INTRAMUSCULAR | Status: AC
  Filled 2013-11-26: qty 10

## 2013-11-26 MED ORDER — LIDOCAINE-EPINEPHRINE (PF) 1 %-1:200000 IJ SOLN
INTRAMUSCULAR | Status: DC | PRN
  Administered 2013-11-26: 3 mL

## 2013-11-26 MED ORDER — FENTANYL CITRATE 0.05 MG/ML IJ SOLN
INTRAMUSCULAR | Status: AC
  Filled 2013-11-26: qty 5

## 2013-11-26 MED ORDER — LIDOCAINE-EPINEPHRINE 1 %-1:100000 IJ SOLN
INTRAMUSCULAR | Status: AC
  Filled 2013-11-26: qty 1

## 2013-11-26 MED ORDER — CARVEDILOL 25 MG PO TABS
25.0000 mg | ORAL_TABLET | Freq: Once | ORAL | Status: AC
  Administered 2013-11-26: 25 mg via ORAL
  Filled 2013-11-26: qty 1

## 2013-11-26 SURGICAL SUPPLY — 37 items
ADH SKN CLS APL DERMABOND .7 (GAUZE/BANDAGES/DRESSINGS) ×1
BAG DECANTER FOR FLEXI CONT (MISCELLANEOUS) ×2 IMPLANT
BLADE SURG 11 STRL SS (BLADE) ×2 IMPLANT
CANISTER SUCTION 2500CC (MISCELLANEOUS) ×2 IMPLANT
COVER SURGICAL LIGHT HANDLE (MISCELLANEOUS) ×4 IMPLANT
DERMABOND ADVANCED (GAUZE/BANDAGES/DRESSINGS) ×1
DERMABOND ADVANCED .7 DNX12 (GAUZE/BANDAGES/DRESSINGS) IMPLANT
DRAPE INCISE IOBAN 66X45 STRL (DRAPES) ×2 IMPLANT
DRAPE LAPAROTOMY T 102X78X121 (DRAPES) ×2 IMPLANT
ELECT CAUTERY BLADE 6.4 (BLADE) ×2 IMPLANT
ELECT REM PT RETURN 9FT ADLT (ELECTROSURGICAL) ×2
ELECTRODE REM PT RTRN 9FT ADLT (ELECTROSURGICAL) ×1 IMPLANT
GAUZE SPONGE 4X4 12PLY STRL (GAUZE/BANDAGES/DRESSINGS) ×2 IMPLANT
GLOVE BIO SURGEON STRL SZ 6 (GLOVE) ×1 IMPLANT
GLOVE BIOGEL PI IND STRL 6.5 (GLOVE) IMPLANT
GLOVE BIOGEL PI INDICATOR 6.5 (GLOVE) ×1
GLOVE EUDERMIC 7 POWDERFREE (GLOVE) ×2 IMPLANT
GOWN STRL REUS W/ TWL LRG LVL3 (GOWN DISPOSABLE) ×1 IMPLANT
GOWN STRL REUS W/ TWL XL LVL3 (GOWN DISPOSABLE) ×1 IMPLANT
GOWN STRL REUS W/TWL LRG LVL3 (GOWN DISPOSABLE) ×2
GOWN STRL REUS W/TWL XL LVL3 (GOWN DISPOSABLE) ×2
KIT BASIN OR (CUSTOM PROCEDURE TRAY) ×2 IMPLANT
KIT ROOM TURNOVER OR (KITS) ×2 IMPLANT
NEEDLE 22X1 1/2 (OR ONLY) (NEEDLE) ×2 IMPLANT
NS IRRIG 1000ML POUR BTL (IV SOLUTION) ×2 IMPLANT
PACK GENERAL/GYN (CUSTOM PROCEDURE TRAY) ×2 IMPLANT
PAD ARMBOARD 7.5X6 YLW CONV (MISCELLANEOUS) ×4 IMPLANT
SPONGE GAUZE 4X4 12PLY STER LF (GAUZE/BANDAGES/DRESSINGS) ×1 IMPLANT
SUT MNCRL AB 4-0 PS2 18 (SUTURE) ×2 IMPLANT
SUT SILK 2 0 SH (SUTURE) ×2 IMPLANT
SUT VIC AB 3-0 SH 27 (SUTURE) ×4
SUT VIC AB 3-0 SH 27X BRD (SUTURE) ×1 IMPLANT
SYR 20CC LL (SYRINGE) ×2 IMPLANT
SYR CONTROL 10ML LL (SYRINGE) ×2 IMPLANT
TOWEL OR 17X24 6PK STRL BLUE (TOWEL DISPOSABLE) ×2 IMPLANT
TOWEL OR 17X26 10 PK STRL BLUE (TOWEL DISPOSABLE) ×2 IMPLANT
WATER STERILE IRR 1000ML POUR (IV SOLUTION) ×2 IMPLANT

## 2013-11-26 NOTE — Brief Op Note (Signed)
11/26/2013  10:50 AM  PATIENT:  Theresa [name]  48 y.o. female  PRE-OPERATIVE DIAGNOSIS:  lung cancer  POST-OPERATIVE DIAGNOSIS:  lung cancer  PROCEDURE:  Procedure(s): REMOVAL PORT-A-CATH (Right)  SURGEON:  Surgeon(s) and Role:    * Gaye Pollack, MD - Primary  PHYSICIAN ASSISTANT: none  ASSISTANTS: none   ANESTHESIA:   local and IV sedation  EBL:     BLOOD ADMINISTERED:none  DRAINS: none   LOCAL MEDICATIONS USED:  LIDOCAINE   SPECIMEN:  No Specimen  DISPOSITION OF SPECIMEN:  N/A  COUNTS:  YES  TOURNIQUET:  * No tourniquets in log *  DICTATION: .Note written in EPIC  PLAN OF CARE: Discharge to home after PACU  PATIENT DISPOSITION:  PACU - hemodynamically stable.   Delay start of Pharmacological VTE agent (>24hrs) due to surgical blood loss or risk of bleeding: not applicable

## 2013-11-26 NOTE — Discharge Instructions (Addendum)
May resume normal activity.  May shower.  Incision is covered with Dermabond surgical adhesive that is waterproof. Do not apply any creams or lotions to the incision site at this time.  What to eat:  For your first meals, you should eat lightly; only small meals initially.  If you do not have nausea, you may eat larger meals.  Avoid spicy, greasy and heavy food.    General Anesthesia, Adult, Care After  Refer to this sheet in the next few weeks. These instructions provide you with information on caring for yourself after your procedure. Your health care provider may also give you more specific instructions. Your treatment has been planned according to current medical practices, but problems sometimes occur. Call your health care provider if you have any problems or questions after your procedure.  WHAT TO EXPECT AFTER THE PROCEDURE  After the procedure, it is typical to experience:  Sleepiness.  Nausea and vomiting. HOME CARE INSTRUCTIONS  For the first 24 hours after general anesthesia:  Have a responsible person with you.  Do not drive a car. If you are alone, do not take public transportation.  Do not drink alcohol.  Do not take medicine that has not been prescribed by your health care provider.  Do not sign important papers or make important decisions.  You may resume a normal diet and activities as directed by your health care provider.  Change bandages (dressings) as directed.  If you have questions or problems that seem related to general anesthesia, call the hospital and ask for the anesthetist or anesthesiologist on call. SEEK MEDICAL CARE IF:  You have nausea and vomiting that continue the day after anesthesia.  You develop a rash. SEEK IMMEDIATE MEDICAL CARE IF:  You have difficulty breathing.  You have chest pain.  You have any allergic problems. Document Released: 06/06/2000 Document Revised: 10/31/2012 Document Reviewed: 09/13/2012  [facility] Patient Information 2014  [place], [place].   Tissue Adhesive Wound Care    Some cuts and wounds can be closed with tissue adhesive. Adhesive is like glue. It holds the skin together and helps a wound heal faster. This adhesive goes away on its own as the wound heals.  HOME CARE  Showers are allowed. Do not soak the wound in water. Do not take baths, swim, or use hot tubs. Do not use soaps or creams on your wound.  If a bandage (dressing) was put on, change it as often as told by your doctor.  Keep the bandage dry.  Do not scratch, pick, or rub the adhesive.  Do not put tape over the adhesive. The adhesive could come off.  Protect the wound from another injury.  Protect the wound from sun and tanning beds.  Only take medicine as told by your doctor.  Keep all doctor visits as told. GET HELP RIGHT AWAY IF:  Your wound is red, puffy (swollen), hot, or tender.  You get a rash after the glue is put on.  You have more pain in the wound.  You have a red streak going away from the wound.  You have yellowish-white fluid (pus) coming from the wound.  You have more bleeding.  You have a fever.  You have chills and start to shake.  You notice a bad smell coming from the wound.  Your wound or adhesive breaks open. MAKE SURE YOU:  Understand these instructions.  Will watch your condition.  Will get help right away if you are not doing well or get  worse. Document Released: 12/08/2007 Document Revised: 12/19/2012 Document Reviewed: 09/19/2012  [facility] Patient Information 2015 [place], [place]. This information is not intended to replace advice given to you by your health care provider. Make sure you discuss any questions you have with your health care provider.

## 2013-11-26 NOTE — Transfer of Care (Signed)
Immediate Anesthesia Transfer of Care Note  Patient: Theresa [name]  Procedure(s) Performed: Procedure(s): REMOVAL PORT-A-CATH (Right)  Patient Location: PACU  Anesthesia Type:MAC  Level of Consciousness: awake, alert  and oriented  Airway & Oxygen Therapy: Patient Spontanous Breathing and Patient connected to face mask oxygen  Post-op Assessment: Report given to PACU RN and Post -op Vital signs reviewed and stable  Post vital signs: Reviewed and stable  Complications: No apparent anesthesia complications

## 2013-11-26 NOTE — Anesthesia Preprocedure Evaluation (Signed)
Anesthesia Evaluation  Patient identified by MRN, date of birth, ID band Patient awake    Reviewed: Allergy & Precautions, H&P , NPO status , Patient's Chart, lab work & pertinent test results  Airway       Dental   Pulmonary asthma , sleep apnea , Current Smoker,          Cardiovascular hypertension,     Neuro/Psych    GI/Hepatic GERD-  ,  Endo/Other  diabetes, Type 2, Oral Hypoglycemic AgentsMorbid obesity  Renal/GU CRFRenal disease     Musculoskeletal  (+) Arthritis -, Fibromyalgia -  Abdominal   Peds  Hematology   Anesthesia Other Findings Hx Lung CA Rx chemo/ surgical  Reproductive/Obstetrics                           Anesthesia Physical Anesthesia Plan  ASA: III  Anesthesia Plan: MAC   Post-op Pain Management:    Induction: Intravenous  Airway Management Planned: Mask  Additional Equipment:   Intra-op Plan:   Post-operative Plan:   Informed Consent: I have reviewed the patients History and Physical, chart, labs and discussed the procedure including the risks, benefits and alternatives for the proposed anesthesia with the patient or authorized representative who has indicated his/her understanding and acceptance.     Plan Discussed with: CRNA, Anesthesiologist and Surgeon  Anesthesia Plan Comments:         Anesthesia Quick Evaluation

## 2013-11-26 NOTE — Interval H&P Note (Signed)
History and Physical Interval Note:  11/26/2013 9:39 AM  Theresa [name]  has presented today for surgery, with the diagnosis of lung cancer  The various methods of treatment have been discussed with the patient and family. After consideration of risks, benefits and other options for treatment, the patient has consented to  Procedure(s): REMOVAL PORT-A-CATH (Right) as a surgical intervention .  The patient's history has been reviewed, patient examined, no change in status, stable for surgery.  I have reviewed the patient's chart and labs.  Questions were answered to the patient's satisfaction.     Gaye Pollack

## 2013-11-26 NOTE — H&P (Signed)
[address]Suite [address]       [place] [address]             [phone]      Cardiothoracic Surgery History and Physical   Theresa [name] is an 48 y.o. female.   Chief Complaint:  Theresa [name] that is no longer needed HPI:   The patient is a 48 year old woman with multiple medical problems who had a left upper lobe trisegmentectomy with lymph node dissection under the care of Dr. Arlyce Dice on March 24, 2008. This was diagnosed as stage IA (T1b, N0, MX) adenocarcinoma. She subsequently had a recurrence and had a right subclavian portacath inserted by Dr. Arlyce Dice on 02/11/2011. She received concurrent chemoradiation with carboplatin for AUC of 2 and paclitaxel 45 mg/M2 given weekly with radiation. She was recently seen by Dr. Julien Nordmann and CT scan showed no evidence of recurrence. The porta-cath has been irritating her and is not being used so she was referred to me for removal.    Past Medical History  Diagnosis Date  . Hypertension   . Hyperlipidemia   . Blindness, legal     glaucoma and retinitis  pigmentosa  . Glaucoma   . Onychomycosis   . Retinitis pigmentosa   . Chronic eczema   . Anxiety   . Hx of radiation therapy 01-17-11 to 03-18-11    lung  . History of chemotherapy 01/2011 to 03/2011    concurrent w/radiation therapy  . Chronic headaches 10-17-11    migraines  . Sleep apnea     cpap 5 yrs  . Asthma   . GERD (gastroesophageal reflux disease)   . Arthritis   . Family history of malignant hyperthermia     1 son 16 years ago  . Shortness of breath     with exertion  . Depression   . Diabetes mellitus type II     diet   . Constipation   . Fibromyalgia     denies, has not been diagnosited  . Lung cancer     lung ca dx 2010  . Family history of anesthesia complication     1 son had malignant hyperthermia at 70yr  tonsils rained    Past Surgical History  Procedure Laterality Date  . Lung surgery  03/24/08    L vats, L thoracotomy and LUL  trisegmentectomy with node dissection  . Chest tube insertion   04/01/08    L hydropneumothorax  . Portacath placement  02/11/2011    Procedure: INSERTION PORT-A-CATH;  Surgeon: DPierre Bali MD;  [facility]  Service: Thoracic;  Laterality: Right;  9.6Fr. Pre-attached Power Port in Right Internal Jugular  -right chest-remains inplace 10-17-11  . Knee arthroscopy  10-17-11    bil. knee scope(left was torn ligament)  . Lumbar laminectomy/decompression microdiscectomy  10/27/2011    Procedure: LUMBAR LAMINECTOMY/DECOMPRESSION MICRODISCECTOMY;  Surgeon: JJohnn Hai MD;  Location: [facility];  Service: Orthopedics;  Laterality: N/A;  L4-L5  . Abdominal hysterectomy  09/19/2001    hx. of fibroids. TAH. Ovaries remain.   . Eye surgery Bilateral     lens implant  . Maximum access (mas)posterior lumbar interbody fusion (plif) 1 level N/A 07/03/2013    Procedure: FOR MAXIMUM ACCESS (MAS) POSTERIOR LUMBAR INTERBODY FUSION (PLIF) 1 LEVEL;  Surgeon: DEustace Moore MD;  [facility];  Service: Neurosurgery;  Laterality: N/A;  FOR MAXIMUM ACCESS (MAS) POSTERIOR LUMBAR INTERBODY FUSION (PLIF) 1 LEVEL (L4-L5)  .  Back surgery  06/2013    Family History  Problem Relation Age of Onset  . Cancer Maternal Aunt     brain  . Depression Mother   . Heart disease Mother   . Depression Son   . Diabetes Maternal Grandmother   . Stroke Maternal Grandmother   . Depression Maternal Grandmother   . Depression Daughter   . Malignant hyperthermia Son 16    tonsils drained   Social History:  reports that she has been smoking Cigarettes.  She has a 15 pack-year smoking history. She has never used smokeless tobacco. She reports that she drinks alcohol. She reports that she does not use illicit drugs.  Allergies:  Allergies  Allergen Reactions  . Morphine And Related Nausea Only  . Codeine     REACTION: GI upset  . Penicillins     REACTION: hives  . Tomato Hives and Itching    Medications Prior to  Admission  Medication Sig Dispense Refill  . amLODipine (NORVASC) 10 MG tablet Take 1 tablet (10 mg total) by mouth every morning.  90 tablet  0  . atorvastatin (LIPITOR) 40 MG tablet Take 1 tablet (40 mg total) by mouth daily.  90 tablet  0  . brimonidine (ALPHAGAN) 0.15 % ophthalmic solution Place 1 drop into both eyes 2 (two) times daily.      . carisoprodol (SOMA) 350 MG tablet Take 1 tablet (350 mg total) by mouth 3 (three) times daily.  30 tablet  0  . carvedilol (COREG) 25 MG tablet Take 1 tablet (25 mg total) by mouth 2 (two) times daily with a meal.  180 tablet  0  . clobetasol cream (TEMOVATE) 7.59 % Apply 1 application topically daily.  45 g  0  . fluticasone (FLONASE) 50 MCG/ACT nasal spray Place 2 sprays into both nostrils daily.  16 g  6  . gabapentin (NEURONTIN) 300 MG capsule Take 1 capsule (300 mg total) by mouth 3 (three) times daily.  270 capsule  0  . glipiZIDE (GLUCOTROL) 10 MG tablet Take 1 tablet (10 mg total) by mouth 2 (two) times daily before a meal.  180 tablet  3  . ipratropium (ATROVENT) 0.06 % nasal spray Place 2 sprays into both nostrils 4 (four) times daily.  15 mL  12  . pantoprazole (PROTONIX) 40 MG tablet Take 1 tablet (40 mg total) by mouth 2 (two) times daily. For 2 weeks. Then decrease to qhs.  45 tablet  0  . potassium chloride SA (K-DUR,KLOR-CON) 20 MEQ tablet Take 1 tablet (20 mEq total) by mouth daily.  90 tablet  3  . valsartan-hydrochlorothiazide (DIOVAN-HCT) 160-12.5 MG per tablet Take 1 tablet by mouth daily.      . varenicline (CHANTIX) 1 MG tablet Take 1 tablet (1 mg total) by mouth 2 (two) times daily.  60 tablet  0  . albuterol (PROVENTIL HFA;VENTOLIN HFA) 108 (90 BASE) MCG/ACT inhaler Inhale 2 puffs into the lungs every 6 (six) hours as needed for wheezing or shortness of breath.  1 Inhaler  0    Results for orders placed during the hospital encounter of 11/26/13 (from the past 48 hour(s))  APTT     Status: None   Collection Time    11/26/13   8:01 AM      Result Value Ref Range   aPTT 29  24 - 37 seconds  CBC     Status: Abnormal   Collection Time    11/26/13  8:01 AM  Result Value Ref Range   WBC 6.9  4.0 - 10.5 K/uL   RBC 5.48 (*) 3.87 - 5.11 MIL/uL   Hemoglobin 16.7 (*) 12.0 - 15.0 g/dL   HCT 49.1 (*) 36.0 - 46.0 %   MCV 89.6  78.0 - 100.0 fL   MCH 30.5  26.0 - 34.0 pg   MCHC 34.0  30.0 - 36.0 g/dL   RDW 13.2  11.5 - 15.5 %   Platelets 212  150 - 400 K/uL  COMPREHENSIVE METABOLIC PANEL     Status: Abnormal   Collection Time    11/26/13  8:01 AM      Result Value Ref Range   Sodium 140  137 - 147 mEq/L   Potassium 3.9  3.7 - 5.3 mEq/L   Chloride 101  96 - 112 mEq/L   CO2 24  19 - 32 mEq/L   Glucose, Bld 202 (*) 70 - 99 mg/dL   BUN 13  6 - 23 mg/dL   Creatinine, Ser 1.30 (*) 0.50 - 1.10 mg/dL   Calcium 9.3  8.4 - 10.5 mg/dL   Total Protein 7.1  6.0 - 8.3 g/dL   Albumin 3.9  3.5 - 5.2 g/dL   AST 14  0 - 37 U/L   ALT 17  0 - 35 U/L   Alkaline Phosphatase 69  39 - 117 U/L   Total Bilirubin 0.6  0.3 - 1.2 mg/dL   GFR calc non Af Amer 47 (*) >90 mL/min   GFR calc Af Amer 54 (*) >90 mL/min   Comment: (NOTE)     The eGFR has been calculated using the CKD EPI equation.     This calculation has not been validated in all clinical situations.     eGFR's persistently <90 mL/min signify possible Chronic Kidney     Disease.   Anion gap 15  5 - 15  PROTIME-INR     Status: None   Collection Time    11/26/13  8:01 AM      Result Value Ref Range   Prothrombin Time 13.5  11.6 - 15.2 seconds   INR 1.03  0.00 - 1.49  GLUCOSE, CAPILLARY     Status: Abnormal   Collection Time    11/26/13  8:05 AM      Result Value Ref Range   Glucose-Capillary 204 (*) 70 - 99 mg/dL   Dg Chest 2 View Within Previous 72 Hours.  Films Obtained On Friday Are Acceptable For Monday And Tuesday Cases  11/26/2013   CLINICAL DATA:  Malignant neoplasm of bronchus and lung.  EXAM: CHEST  2 VIEW  COMPARISON:  June 25, 2013.  FINDINGS: Stable  scarring and postoperative changes are noted in the left upper lobe consistent with history of prior upper lobe resection. Right internal jugular Port-A-Cath is again noted with distal tip in expected position of the SVC. No pneumothorax or pleural effusion is noted. No acute pulmonary disease is noted.  IMPRESSION: Stable scarring and postoperative changes seen in left upper lobe. No significant change compared to prior exam.   Electronically Signed   By: Sabino Dick M.D.   On: 11/26/2013 08:40    Review of Systems  Constitutional: Negative for fever, chills and weight loss.  HENT: Negative.   Eyes: Negative.   Respiratory: Negative.   Cardiovascular: Negative.   Skin:       Forms keloids    Blood pressure 136/75, pulse 85, temperature 97 F (36.1 C), temperature source Oral,  resp. rate 18, weight 102.967 kg (227 lb), SpO2 98.00%. Physical Exam  Constitutional:  Obese African-american female in no distress  Cardiovascular: Normal rate, regular rhythm and normal heart sounds.   No murmur heard. Respiratory: Effort normal and breath sounds normal. No respiratory distress. She has no rales. She exhibits no tenderness.  Porta-cath on right anterior chest wall. Keloid in the scar.     Assessment/Plan  She has a port-cath that is not being used and she wants it removed. She has no evidence of lung cancer recurrence. She would like the current keloid scar excised and I think that is possible since it is small and the skin has enough laxity to close. She understands that she may form another keloid at this site. I discussed the procedure, alternative of leaving the port in place and risk including bleeding, infection, keloid formation and she understands and agrees to proceed.  BARTLE,BRYAN K 11/26/2013, 9:23 AM

## 2013-11-26 NOTE — Anesthesia Postprocedure Evaluation (Signed)
  Anesthesia Post-op Note  Patient: Theresa [name]  Procedure(s) Performed: Procedure(s): REMOVAL PORT-A-CATH (Right)  Patient Location: PACU  Anesthesia Type:MAC  Level of Consciousness: awake, alert , oriented and patient cooperative  Airway and Oxygen Therapy: Patient Spontanous Breathing  Post-op Pain: none  Post-op Assessment: Post-op Vital signs reviewed, Patient's Cardiovascular Status Stable, Respiratory Function Stable, Patent Airway and No signs of Nausea or vomiting  Post-op Vital Signs: stable  Last Vitals:  Filed Vitals:   11/26/13 1130  BP: 108/71  Pulse: 74  Temp:   Resp: 21    Complications: No apparent anesthesia complications

## 2013-11-28 ENCOUNTER — Encounter (HOSPITAL_COMMUNITY): Payer: Self-pay | Admitting: Surgery

## 2013-12-19 ENCOUNTER — Telehealth: Payer: Self-pay | Admitting: Family Medicine

## 2013-12-19 NOTE — Telephone Encounter (Signed)
Will forward to MD to make him aware. Jazmin Hartsell,CMA  

## 2013-12-19 NOTE — Telephone Encounter (Signed)
Will be receiving fax from [facility] Will need signature of certified physican rather than resident

## 2013-12-31 NOTE — Telephone Encounter (Signed)
I still haven't seen this show up in my box. Can you call patient and see if it was faxed to [place] yet? Thanks.

## 2013-12-31 NOTE — Telephone Encounter (Signed)
Spoke with Ms. Theresa [name] and got the number for [employer].  Spoke with Theresa [name] and he will be refaxing this to my attention.  Will ask one of the preceptors to sign this for patient.  She states that she has not been able to check her sugar and feels like it is high. Anzal Bartnick,CMA

## 2014-01-02 NOTE — Telephone Encounter (Signed)
Haven't seen fax for [employer].  Called and spoke with Jinny Sanders at [facility] and she is going to refax [place] the form.  Asked her to put it to my attention so I can be on the lookout for it. Akeila Lana,CMA

## 2014-01-02 NOTE — Telephone Encounter (Signed)
Forms signed by Dr. Ree Kida and faxed back to [employer]. Jazmin Hartsell,CMA

## 2014-01-07 ENCOUNTER — Telehealth: Payer: Self-pay | Admitting: Family Medicine

## 2014-01-07 NOTE — Telephone Encounter (Signed)
Pt states [employer] has not received the fax about her diabetic supplies. Previous notes show it was faxed on Oct 22. Please refax

## 2014-01-07 NOTE — Telephone Encounter (Signed)
Refaxed. Theresa [name], Theresa [name]

## 2014-01-10 ENCOUNTER — Other Ambulatory Visit: Payer: Self-pay | Admitting: Nurse Practitioner

## 2014-01-29 ENCOUNTER — Encounter: Payer: Self-pay | Admitting: Family Medicine

## 2014-01-29 ENCOUNTER — Ambulatory Visit (INDEPENDENT_AMBULATORY_CARE_PROVIDER_SITE_OTHER): Payer: Medicare Other | Admitting: Family Medicine

## 2014-01-29 VITALS — BP 129/86 | HR 71 | Temp 98.0°F | Ht 61.0 in | Wt 225.0 lb

## 2014-01-29 DIAGNOSIS — E119 Type 2 diabetes mellitus without complications: Secondary | ICD-10-CM

## 2014-01-29 DIAGNOSIS — M542 Cervicalgia: Secondary | ICD-10-CM

## 2014-01-29 LAB — POCT GLYCOSYLATED HEMOGLOBIN (HGB A1C): Hemoglobin A1C: 10.6

## 2014-01-29 NOTE — Assessment & Plan Note (Signed)
Seems consistent with muscle irritation.  Given history of lung cancer and age would check cervical xrays (had normal chest xray in September).  If normal consider Physical Therapy referral.

## 2014-01-29 NOTE — Patient Instructions (Addendum)
Good to see you today!  Thanks for coming in.  Neck Pain  Use heat at least 4 x a day and do range of motion exercises If the xray if normal when you see Dr Theresa [name] if you are not better he may refer you to Physical Therapy If you have any loss of strength in your hands or feet come right back  Diabetes meter Ask you company to send you and to resend [place] the paper work and Dr Theresa [name] can review it when you see him for your diabetes  Ear Use a Q tip with hydrocortisone ointment rub gently inside left ear to help with itching   Bring all your medications bottles next visit  See Dr Theresa [name] for your diabetes and neck follow up in 1-3 weeks

## 2014-01-29 NOTE — Progress Notes (Signed)
   Subjective:    Patient ID: Theresa [name], female    DOB: 04-15-1965, 48 y.o.   MRN: [id number]  HPI  Left Neck Shoulder pain For last few weeks.  No trauma or overuse she can remember.  No weakness of arms or legs Is an achy pain with tightness in her muscles without radiation.  Massage and heat and soma does not help much  Left Ear - itching painful sensation intermittently.  No discharge or bleeding or change in hearing No trauma.  Has not tried any treatments  Chief Complaint noted Review of Symptoms - see HPI PMH - Smoking status noted.   Vital Signs reviewed   Review of Systems     Objective:   Physical Exam Alert no acute distress Neck - tender over poster left side with tenderness in the left trapezius with increased muscle tension No vertebral body tenderness Range of motion - midly decreased in neck and left shoulder with some discomfort.  Distal strength of wrist and grip on left is normal Mouth - no lesions, mucous membranes are moist, no decaying teeth  Throat: normal mucosa, no exudate, uvula midline, no redness Ear - R ear is normal Left shows irritated red and slightly dry flaky external canal. TM is normal        Assessment & Plan:    Left Ear discomfort - likely irritant dry skin - try steroid ointment

## 2014-03-09 ENCOUNTER — Other Ambulatory Visit: Payer: Self-pay | Admitting: Family Medicine

## 2014-03-09 ENCOUNTER — Other Ambulatory Visit: Payer: Self-pay | Admitting: Cardiovascular Disease

## 2014-03-09 DIAGNOSIS — I10 Essential (primary) hypertension: Secondary | ICD-10-CM

## 2014-03-09 DIAGNOSIS — E785 Hyperlipidemia, unspecified: Secondary | ICD-10-CM

## 2014-03-20 ENCOUNTER — Other Ambulatory Visit: Payer: Self-pay | Admitting: Family Medicine

## 2014-03-20 MED ORDER — OMEPRAZOLE 40 MG PO CPDR: 40.0000 mg | DELAYED_RELEASE_CAPSULE | Freq: Every day | ORAL | Status: DC

## 2014-04-18 ENCOUNTER — Encounter: Payer: Self-pay | Admitting: Family Medicine

## 2014-04-18 ENCOUNTER — Ambulatory Visit (INDEPENDENT_AMBULATORY_CARE_PROVIDER_SITE_OTHER): Payer: 59 | Admitting: Family Medicine

## 2014-04-18 VITALS — BP 137/89 | HR 71 | Temp 98.3°F | Wt 221.8 lb

## 2014-04-18 DIAGNOSIS — K2970 Gastritis, unspecified, without bleeding: Secondary | ICD-10-CM

## 2014-04-18 MED ORDER — ONDANSETRON 4 MG PO TBDP: 4.0000 mg | ORAL_TABLET | Freq: Three times a day (TID) | ORAL | Status: DC | PRN

## 2014-04-18 MED ORDER — RANITIDINE HCL 150 MG PO TABS: 150.0000 mg | ORAL_TABLET | Freq: Two times a day (BID) | ORAL | Status: DC

## 2014-04-18 MED ORDER — OMEPRAZOLE 40 MG PO CPDR: 40.0000 mg | DELAYED_RELEASE_CAPSULE | Freq: Two times a day (BID) | ORAL | Status: DC

## 2014-04-18 MED ORDER — OMEPRAZOLE 40 MG PO CPDR: 40.0000 mg | DELAYED_RELEASE_CAPSULE | Freq: Every day | ORAL | Status: DC

## 2014-04-18 NOTE — Patient Instructions (Signed)
Thank you for coming in, today!  I think you have something called gastritis (severe inflammation of the stomach). I want you to keep taking your omeprazole twice per day. This reduces the amount of acid your stomach makes. START taking Zantac (ranitidine) 150 mg twice per day. This reduces the amount of acid your stomach releases. These two medicines work together to help your stomach have time to heal. I will also give you a medicine called Zofran to help with nausea. AVOID the "NSAID" medications -- ibuprofen, Aleve, and so on. It's okay to take your daily aspirin.  I will refer you to the GI doctor, as well. They can help decide whether or not they need to do an endoscopy or other procedures to look at your stomach. Come back to see Dr. Lamar Benes as you need.  Please feel free to call with any questions or concerns at any time, at [phone]. --Dr. Venetia Maxon

## 2014-04-18 NOTE — Progress Notes (Signed)
   Subjective:    Patient ID: Theresa [name], female    DOB: 01-Sep-1965, 49 y.o.   MRN: [id number]  HPI: Pt presents to clinic for SDA for sharp pains in her stomach for about 1 year; it has been worse especially for about 2 weeks. Her pain is not present all the time but comes when she coughs or sits certain ways. Her pain is sometimes worse when she eats, especially with red meats. Her pain is stabbing in nature like "like someone punched her." She will also get nausea when she gets these pain. She is taking omeprazole (actually taking two pills per day), without much relief; it used to help but is not, now. Nothing at all seems to be very helpful, now. She does take ibuprofen every other day (one time on days she takes it, 4 of the 200 mg tablets). She has some gas / belching.  Review of Systems: As above. She does have coughing; note she has a history of lung cancer and is scheduled for f/u with Dr. Julien Nordmann in March.     Objective:   Physical Exam BP 137/89 mmHg  Pulse 71  Temp(Src) 98.3 F (36.8 C) (Oral)  Wt 221 lb 12.8 oz (100.608 kg) Gen: well-appearing adult female in NAD HEENT: [place]/AT, EOMI, PERRLA  MMM, posterior oropharynx mildly red without tonsillar swelling / exudate Cardio: RRR, no murmur appreciated Pulm: CTAB, with mildly diminished breath sounds equally at the bases Abd: soft, obese limiting exam, but BS+  Moderately tender in epigastrium and in left upper abdomen under costal margin Ext: warm, well-perfused, no LE edema     Assessment & Plan:  49yo female with constellation of symptoms suggestive of GERD-related gastritis +/- component of NSAID use - continue omeprazole (high dose) 40 mg BID and added new Rx for Zantac 150 mg BID, today - recommended allowing at least 2 weeks for Zantac to see if any benefit is had - Rx for Zofran for nausea, as well - strongly recommended avoiding NSAIDs completely for now (but did instruct her to continue daily 81 mg ASA) -  referral placed for GI, today, per pt request due to severity and duration of symptoms despite PPI therapy - will need to f/u with PCP Dr. Lamar Benes, regardless  Note FYI to Dr. Marland Mcalpine, MD PGY-3, [facility] 04/18/2014, 3:16 PM

## 2014-05-02 ENCOUNTER — Other Ambulatory Visit: Payer: Self-pay | Admitting: Family Medicine

## 2014-05-12 ENCOUNTER — Ambulatory Visit (INDEPENDENT_AMBULATORY_CARE_PROVIDER_SITE_OTHER): Payer: 59 | Admitting: Family Medicine

## 2014-05-12 VITALS — BP 136/83 | HR 72 | Temp 98.3°F | Wt 220.1 lb

## 2014-05-12 DIAGNOSIS — I10 Essential (primary) hypertension: Secondary | ICD-10-CM

## 2014-05-12 DIAGNOSIS — F329 Major depressive disorder, single episode, unspecified: Secondary | ICD-10-CM

## 2014-05-12 DIAGNOSIS — F331 Major depressive disorder, recurrent, moderate: Secondary | ICD-10-CM

## 2014-05-12 DIAGNOSIS — E119 Type 2 diabetes mellitus without complications: Secondary | ICD-10-CM

## 2014-05-12 DIAGNOSIS — F32A Depression, unspecified: Secondary | ICD-10-CM

## 2014-05-12 DIAGNOSIS — E785 Hyperlipidemia, unspecified: Secondary | ICD-10-CM

## 2014-05-12 DIAGNOSIS — L309 Dermatitis, unspecified: Secondary | ICD-10-CM

## 2014-05-12 LAB — POCT GLYCOSYLATED HEMOGLOBIN (HGB A1C): Hemoglobin A1C: >15

## 2014-05-12 MED ORDER — CLOBETASOL PROPIONATE 0.05 % EX CREA: 1.0000 "application " | TOPICAL_CREAM | Freq: Every day | CUTANEOUS | Status: DC

## 2014-05-12 MED ORDER — AMLODIPINE BESYLATE 10 MG PO TABS: 10.0000 mg | ORAL_TABLET | Freq: Every morning | ORAL | Status: DC

## 2014-05-12 MED ORDER — GABAPENTIN 300 MG PO CAPS: 300.0000 mg | ORAL_CAPSULE | Freq: Three times a day (TID) | ORAL | Status: DC

## 2014-05-12 MED ORDER — CARVEDILOL 25 MG PO TABS: ORAL_TABLET | ORAL | Status: DC

## 2014-05-12 MED ORDER — FLUOXETINE HCL 20 MG PO TABS: 20.0000 mg | ORAL_TABLET | Freq: Every day | ORAL | Status: DC

## 2014-05-12 MED ORDER — GLIPIZIDE 10 MG PO TABS: 10.0000 mg | ORAL_TABLET | Freq: Two times a day (BID) | ORAL | Status: DC

## 2014-05-12 MED ORDER — ATORVASTATIN CALCIUM 40 MG PO TABS: 40.0000 mg | ORAL_TABLET | Freq: Every day | ORAL | Status: DC

## 2014-05-12 NOTE — Patient Instructions (Signed)
Your Hemoglobin A1c is greater than 15. This is very high, and if we are unable to get this under better control you are at risk for develop major complications from diabetes including damaging your kidneys further.   For now, continue your glipizide twice a day, refills were sent to the pharmacy. I will review your chart and look to why metformin was stopped. If it is okay for you to take it again I will call you before you leave on the 18th and send in a prescription. The medication may have been stopped because of your kidney function.  Start the prozac 20mg  once a day. We will have you follow up in 6 weeks.

## 2014-05-12 NOTE — Progress Notes (Signed)
   Subjective:    Patient ID: Theresa [name], female    DOB: 1965/08/14, 49 y.o.   MRN: [id number]  HPI  CC: follow up  # Diabetes  Thinks she has not been doing well  Taking her glipizide, states has not missed any doses  Has not been following DM diet  # Depression:  Feels she is having a lot of stress in her life including having to take care of parents (has trip to [place] coming up for this)  Becomes tearful discussing her current state  PHQ9: 21/somewhat (3,2,3,3,3,3,3,1,0) ROS: no SI/HI  Review of Systems   See HPI for ROS. All other systems reviewed and are negative.  Past medical history, surgical, family, and social history reviewed and updated in the EMR as appropriate. Objective:  BP 136/83 mmHg  Pulse 72  Temp(Src) 98.3 F (36.8 C) (Oral)  Wt 220 lb 2 oz (99.848 kg) Vitals and nursing note reviewed  General: NAD CV: RRR nl s1s2 no mrg Resp: CTAB nl effort DM foot exam done, documented in epic, normal.  Assessment & Plan:  See Problem List Documentation

## 2014-05-13 NOTE — Assessment & Plan Note (Signed)
PHQ9 indicating recurrent MDD. Pt requests restarting prozac, had been on this in the past (though most recently was on wellbutrin). No HI/SI. F/u 6 weeks after returns from [place] trip.

## 2014-05-13 NOTE — Assessment & Plan Note (Signed)
Reports adherent to medication, however A1c >15. Had been on glipizide only, reviewing chart think metformin was stopped due to CKD though her baseline Cr appears to be below 1.4. Refills sent for glipizide, stressed importance of better sugar control or running risk of worsening vision problems, kidney damage, nerve damage. F/u 6 weeks.

## 2014-05-20 ENCOUNTER — Encounter (HOSPITAL_COMMUNITY): Payer: Self-pay

## 2014-05-20 ENCOUNTER — Other Ambulatory Visit (HOSPITAL_BASED_OUTPATIENT_CLINIC_OR_DEPARTMENT_OTHER): Payer: Medicare Other

## 2014-05-20 ENCOUNTER — Ambulatory Visit (HOSPITAL_COMMUNITY)
Admission: RE | Admit: 2014-05-20 | Discharge: 2014-05-20 | Disposition: A | Discharge status: Home / Self Care | Payer: Medicare Other | Source: Ambulatory Visit | Attending: Internal Medicine | Admitting: Internal Medicine

## 2014-05-20 ENCOUNTER — Encounter: Payer: Self-pay | Admitting: Family Medicine

## 2014-05-20 ENCOUNTER — Ambulatory Visit: Payer: 59 | Admitting: Nurse Practitioner

## 2014-05-20 DIAGNOSIS — C3410 Malignant neoplasm of upper lobe, unspecified bronchus or lung: Secondary | ICD-10-CM

## 2014-05-20 DIAGNOSIS — Z902 Acquired absence of lung [part of]: Secondary | ICD-10-CM | POA: Insufficient documentation

## 2014-05-20 DIAGNOSIS — C3490 Malignant neoplasm of unspecified part of unspecified bronchus or lung: Secondary | ICD-10-CM

## 2014-05-20 DIAGNOSIS — C3412 Malignant neoplasm of upper lobe, left bronchus or lung: Secondary | ICD-10-CM | POA: Insufficient documentation

## 2014-05-20 LAB — CBC WITH DIFFERENTIAL/PLATELET
BASO%: 0.2 % (ref 0.0–2.0)
Basophils Absolute: 0 10*3/uL (ref 0.0–0.1)
EOS%: 3.7 % (ref 0.0–7.0)
Eosinophils Absolute: 0.3 10*3/uL (ref 0.0–0.5)
HCT: 52.9 % — ABNORMAL HIGH (ref 34.8–46.6)
HGB: 18.2 g/dL — ABNORMAL HIGH (ref 11.6–15.9)
LYMPH%: 21.6 % (ref 14.0–49.7)
MCH: 31 pg (ref 25.1–34.0)
MCHC: 34.4 g/dL (ref 31.5–36.0)
MCV: 90 fL (ref 79.5–101.0)
MONO#: 0.7 10*3/uL (ref 0.1–0.9)
MONO%: 8.7 % (ref 0.0–14.0)
NEUT#: 5.3 10*3/uL (ref 1.5–6.5)
NEUT%: 65.8 % (ref 38.4–76.8)
Platelets: 204 10*3/uL (ref 145–400)
RBC: 5.88 10*6/uL — ABNORMAL HIGH (ref 3.70–5.45)
RDW: 12.9 % (ref 11.2–14.5)
WBC: 8 10*3/uL (ref 3.9–10.3)
lymph#: 1.7 10*3/uL (ref 0.9–3.3)

## 2014-05-20 LAB — COMPREHENSIVE METABOLIC PANEL (CC13)
ALT: 17 U/L (ref 0–55)
AST: 13 U/L (ref 5–34)
Albumin: 4.4 g/dL (ref 3.5–5.0)
Alkaline Phosphatase: 67 U/L (ref 40–150)
Anion Gap: 11 mEq/L (ref 3–11)
BUN: 8.3 mg/dL (ref 7.0–26.0)
CO2: 26 mEq/L (ref 22–29)
Calcium: 9.4 mg/dL (ref 8.4–10.4)
Chloride: 102 mEq/L (ref 98–109)
Creatinine: 1.6 mg/dL — ABNORMAL HIGH (ref 0.6–1.1)
EGFR: 42 mL/min/{1.73_m2} — ABNORMAL LOW (ref >90)
Glucose: 197 mg/dl — ABNORMAL HIGH (ref 70–140)
Potassium: 3.5 mEq/L (ref 3.5–5.1)
Sodium: 139 mEq/L (ref 136–145)
Total Bilirubin: 1.11 mg/dL (ref 0.20–1.20)
Total Protein: 7.3 g/dL (ref 6.4–8.3)

## 2014-05-20 MED ORDER — IOHEXOL 300 MG/ML  SOLN
80.0000 mL | Freq: Once | INTRAMUSCULAR | Status: AC | PRN
  Administered 2014-05-20: 80 mL via INTRAVENOUS

## 2014-05-23 ENCOUNTER — Ambulatory Visit (INDEPENDENT_AMBULATORY_CARE_PROVIDER_SITE_OTHER): Payer: Medicare Other | Admitting: Nurse Practitioner

## 2014-05-23 ENCOUNTER — Encounter: Payer: Self-pay | Admitting: Nurse Practitioner

## 2014-05-23 VITALS — BP 128/72 | HR 72 | Ht 62.5 in | Wt 220.2 lb

## 2014-05-23 DIAGNOSIS — R1012 Left upper quadrant pain: Secondary | ICD-10-CM | POA: Insufficient documentation

## 2014-05-23 DIAGNOSIS — K5900 Constipation, unspecified: Secondary | ICD-10-CM | POA: Insufficient documentation

## 2014-05-23 MED ORDER — SUCRALFATE 1 GM/10ML PO SUSP: ORAL | Status: DC

## 2014-05-23 MED ORDER — OMEPRAZOLE 40 MG PO CPDR: DELAYED_RELEASE_CAPSULE | ORAL | Status: DC

## 2014-05-23 MED ORDER — LINACLOTIDE 290 MCG PO CAPS: 290.0000 ug | ORAL_CAPSULE | Freq: Every day | ORAL | Status: DC

## 2014-05-23 NOTE — Progress Notes (Signed)
HPI :  Patient is a 49 year old female with a history of lung cancer status post resection and chemotherapy approximately 3 years ago.She was seen by Dr. Henrene Pastor in 2011 for LUQ pain. EGD was normal. Patient is referred by PCP for evaluation of abdominal pain. Patient initially thought pain had been present for a couple of months then realized she was seen in 2011 for the same pain. Patient believes the pain has  gotten worse over the last 2 months. It is constant, not consistently worse with eating. She has associated nausea without vomiting. She has involuntarily lost 6 pounds. Labs this month reveal normal liver function studies, normal white count. Hemoglobin above normal, she smokes.   Patient endorses constipation. She has tried enemas, Dulcolax and colon cleansings.   hPast Medical History  Diagnosis Date  . Hypertension   . Hyperlipidemia   . Blindness, legal     glaucoma and retinitis  pigmentosa  . Glaucoma   . Onychomycosis   . Retinitis pigmentosa   . Chronic eczema   . Anxiety   . Hx of radiation therapy 01-17-11 to 03-18-11    lung  . History of chemotherapy 01/2011 to 03/2011    concurrent w/radiation therapy  . Chronic headaches 10-17-11    migraines  . Sleep apnea     cpap 5 yrs  . Asthma   . GERD (gastroesophageal reflux disease)   . Arthritis   . Family history of malignant hyperthermia     1 son 16 years ago  . Depression   . Diabetes mellitus type II     diet   . Constipation   . Fibromyalgia     denies, has not been diagnosited  . Lung cancer     lung ca dx 2010  . Family history of anesthesia complication     1 son had malignant hyperthermia at 17yrs  tonsils rained    Family History  Problem Relation Age of Onset  . Brain cancer Maternal Aunt   . Depression Mother   . Heart disease Mother   . Depression Son   . Diabetes Maternal Grandmother   . Stroke Maternal Grandmother   . Depression Maternal Grandmother   . Depression Daughter   . Malignant  hyperthermia Son 16    tonsils drained   History  Substance Use Topics  . Smoking status: Current Some Day Smoker -- 0.50 packs/day for 30 years    Types: Cigarettes  . Smokeless tobacco: Never Used     Comment: using chantix, hx 1 1/2 PPD(has decrease to 3 cigs per day)  . Alcohol Use: 0.0 oz/week    0 Standard drinks or equivalent per week     Comment: occasional   Current Outpatient Prescriptions  Medication Sig Dispense Refill  . albuterol (PROVENTIL HFA;VENTOLIN HFA) 108 (90 BASE) MCG/ACT inhaler Inhale 2 puffs into the lungs every 6 (six) hours as needed for wheezing or shortness of breath. 1 Inhaler 0  . amLODipine (NORVASC) 10 MG tablet Take 1 tablet (10 mg total) by mouth every morning. 90 tablet 1  . brimonidine (ALPHAGAN) 0.15 % ophthalmic solution Place 1 drop into both eyes 2 (two) times daily.    . carisoprodol (SOMA) 350 MG tablet Take 1 tablet (350 mg total) by mouth 3 (three) times daily. 30 tablet 0  . carvedilol (COREG) 25 MG tablet TAKE 1 TABLET BY MOUTH TWICE DAILY WITH A MEAL 180 tablet 1  . clobetasol cream (TEMOVATE) 0.05 % Apply 1  application topically daily. 45 g 1  . FLUoxetine (PROZAC) 20 MG tablet Take 1 tablet (20 mg total) by mouth daily. 30 tablet 1  . fluticasone (FLONASE) 50 MCG/ACT nasal spray Place 2 sprays into both nostrils daily. 16 g 6  . gabapentin (NEURONTIN) 300 MG capsule Take 1 capsule (300 mg total) by mouth 3 (three) times daily. 270 capsule 1  . glipiZIDE (GLUCOTROL) 10 MG tablet Take 1 tablet (10 mg total) by mouth 2 (two) times daily before a meal. 180 tablet 3  . ipratropium (ATROVENT) 0.06 % nasal spray Place 2 sprays into both nostrils 4 (four) times daily. 15 mL 12  . omeprazole (PRILOSEC) 40 MG capsule TAKE ONE CAPSULE BY MOUTH EVERY DAY 90 capsule 3  . ondansetron (ZOFRAN ODT) 4 MG disintegrating tablet Take 1 tablet (4 mg total) by mouth every 8 (eight) hours as needed for nausea or vomiting. 30 tablet 1  . potassium chloride SA  (K-DUR,KLOR-CON) 20 MEQ tablet Take 1 tablet (20 mEq total) by mouth daily. 90 tablet 3  . ranitidine (ZANTAC) 150 MG tablet Take 1 tablet (150 mg total) by mouth 2 (two) times daily. 60 tablet 1  . valsartan-hydrochlorothiazide (DIOVAN-HCT) 160-12.5 MG per tablet Take 1 tablet by mouth daily.    . varenicline (CHANTIX) 1 MG tablet Take 1 tablet (1 mg total) by mouth 2 (two) times daily. 60 tablet 0   No current facility-administered medications for this visit.   Allergies  Allergen Reactions  . Morphine And Related Nausea Only  . Codeine     REACTION: GI upset  . Penicillins     REACTION: hives  . Tomato Hives and Itching     Review of Systems: Positive for allergy/sinus trouble, arthritis, back pain, breast changes, depression, fatigue, headaches, muscle pain, night sweats, shortness breath, excessive thirst, excessive urination, and urine leakage.All systems reviewed and negative except where noted in HPI.    Ct Chest W Contrast  05/20/2014   CLINICAL DATA:  Subsequent treatment strategy for lung cancer. Left upper lobe lobectomy 2010.  EXAM: CT CHEST WITH CONTRAST  TECHNIQUE: Multidetector CT imaging of the chest was performed during intravenous contrast administration.  CONTRAST:  6mL OMNIPAQUE IOHEXOL 300 MG/ML  SOLN  Comparison:  Chest CT 11/12/2013  FINDINGS: Mediastinum/Nodes: No axillary or supraclavicular lymphadenopathy. No mediastinal hilar lymphadenopathy. No pericardial fluid.  Lungs/Pleura: Volume loss in the left hemi thorax. There is consolidation in the left upper lobe with air bronchograms and surgical material. This consolidative pattern which is not changed from comparison exam. No measurable nodularity.  Upper abdomen: Limited view of the liver, kidneys, pancreas are unremarkable. Normal adrenal glands.  Musculoskeletal: No aggressive osseous lesion.  IMPRESSION: 1. Stable consolidation in the left upper lobe at site of surgery and radiotherapy. 2. No evidence of disease  progression in the thorax.   Electronically Signed   By: Suzy Bouchard M.D.   On: 05/20/2014 12:28    Physical Exam: BP 128/72 mmHg  Pulse 72  Ht 5' 2.5" (1.588 m)  Wt 220 lb 4 oz (99.905 kg)  BMI 39.62 kg/m2 Constitutional: Pleasant, obese, black female in no acute distress. HEENT: Normocephalic and atraumatic. Conjunctivae are normal. No scleral icterus. Neck supple.  Cardiovascular: Normal rate, regular rhythm.  Pulmonary/chest: Effort normal and breath sounds normal. No wheezing, rales or rhonchi. Abdominal: Soft, nondistended, mild LUQ tenderness. Bowel sounds active throughout. There are no masses palpable. No hepatomegaly. Extremities: no edema Lymphadenopathy: No cervical adenopathy noted. Neurological: Alert and  oriented to person place and time. Skin: Skin is warm and dry. No rashes noted. Psychiatric: Normal mood and affect. Behavior is normal.   ASSESSMENT AND PLAN:  29. 49 year old female with chronic left upper quadrant pain of unclear etiology. Pain worse over last two months and with associated nausea. Upper endoscopy for the same pain in 2011 was normal. It should be noted however that patient has been taking several NSAIDs every other day for the last several months so NSAID induced gastropathy and/or peptic ulcer disease not ruled out. She is on chronic PPI therapy, Zantac was recently added without any improvement.   no anti-inflammatories for 6-8 weeks (can continue the  Daily baby aspirin)   Zantac didn't help, will discontinue and resume BID Omeprazole.   Discontinue Zantac  Trial of Carafate twice daily  Follow up in 6-8 weeks.   2. Constipation. Will try Linzess  3. Colon cancer screening. She will think about screening colonoscopy and let us know at next visit  4. CKD, hx of non-small cell lung cancer stage IA (T1b, N0, MX)  November 2009, s/p resection and chemoradiation

## 2014-05-23 NOTE — Patient Instructions (Addendum)
You have been given a separate informational sheet regarding your tobacco use, the importance of quitting and local resources to help you quit. We sent prescriptions to [facility] 290 mcg for constipation 2. Omeprazole twice daily. 3. Carafate suspension take twice daily before breakfast and dinner.  We made you an appointment with Dr. Scarlette Shorts to follow up on 07-01-2014 at 9:15 am.

## 2014-05-25 NOTE — Progress Notes (Signed)
Agree with initial assessment and plans 

## 2014-05-26 ENCOUNTER — Ambulatory Visit: Payer: Medicare Other | Admitting: Internal Medicine

## 2014-06-10 ENCOUNTER — Other Ambulatory Visit: Payer: Self-pay | Admitting: Family Medicine

## 2014-06-24 ENCOUNTER — Ambulatory Visit (INDEPENDENT_AMBULATORY_CARE_PROVIDER_SITE_OTHER): Payer: Medicare Other | Admitting: Family Medicine

## 2014-06-24 ENCOUNTER — Other Ambulatory Visit: Payer: Self-pay | Admitting: Family Medicine

## 2014-06-24 ENCOUNTER — Encounter: Payer: Self-pay | Admitting: Family Medicine

## 2014-06-24 VITALS — BP 131/83 | HR 84 | Temp 98.6°F | Ht 63.0 in | Wt 217.0 lb

## 2014-06-24 DIAGNOSIS — M179 Osteoarthritis of knee, unspecified: Secondary | ICD-10-CM

## 2014-06-24 DIAGNOSIS — F329 Major depressive disorder, single episode, unspecified: Secondary | ICD-10-CM | POA: Diagnosis not present

## 2014-06-24 DIAGNOSIS — E1165 Type 2 diabetes mellitus with hyperglycemia: Secondary | ICD-10-CM

## 2014-06-24 DIAGNOSIS — F32A Depression, unspecified: Secondary | ICD-10-CM

## 2014-06-24 DIAGNOSIS — IMO0002 Reserved for concepts with insufficient information to code with codable children: Secondary | ICD-10-CM

## 2014-06-24 DIAGNOSIS — J0100 Acute maxillary sinusitis, unspecified: Secondary | ICD-10-CM

## 2014-06-24 DIAGNOSIS — F331 Major depressive disorder, recurrent, moderate: Secondary | ICD-10-CM

## 2014-06-24 DIAGNOSIS — M1710 Unilateral primary osteoarthritis, unspecified knee: Secondary | ICD-10-CM

## 2014-06-24 MED ORDER — OMEPRAZOLE 40 MG PO CPDR: DELAYED_RELEASE_CAPSULE | ORAL | Status: DC

## 2014-06-24 MED ORDER — INSULIN PEN NEEDLE 29G X 12.7MM MISC: Status: DC

## 2014-06-24 MED ORDER — ONDANSETRON HCL 4 MG PO TABS: 4.0000 mg | ORAL_TABLET | Freq: Three times a day (TID) | ORAL | Status: DC | PRN

## 2014-06-24 MED ORDER — INSULIN GLARGINE 100 UNIT/ML SOLOSTAR PEN: PEN_INJECTOR | SUBCUTANEOUS | Status: DC

## 2014-06-24 MED ORDER — AMOXICILLIN-POT CLAVULANATE 875-125 MG PO TABS: 1.0000 | ORAL_TABLET | Freq: Two times a day (BID) | ORAL | Status: DC

## 2014-06-24 MED ORDER — FLUOXETINE HCL 20 MG PO TABS: 20.0000 mg | ORAL_TABLET | Freq: Every day | ORAL | Status: DC

## 2014-06-24 MED ORDER — LIDOCAINE 5 % EX OINT: 1.0000 "application " | TOPICAL_OINTMENT | Freq: Every day | CUTANEOUS | Status: DC | PRN

## 2014-06-24 NOTE — Assessment & Plan Note (Signed)
Pt requests lidocaine cream for knee pain, which she reports tried mother's and had good relief. Rx sent.

## 2014-06-24 NOTE — Assessment & Plan Note (Signed)
Uncontrolled. Pt reporting persistently elevated sugars. Will add basal insulin (flexpen due to poor vision), 15 units daily and instructions to titrate if she feels confident to do this on her own. Brief insulin injection instructions given in clinic. F/u 2 weeks.

## 2014-06-24 NOTE — Progress Notes (Signed)
   Subjective:    Patient ID: Theresa [name], female    DOB: Mar 19, 1965, 49 y.o.   MRN: [id number]  HPI  CC: diabetes follow up  # Diabetes:                                                 Willing to start insulin but needs to use a pen because of her vision  CBG monitoring: AM yesterday 315, AM 2 days ago 400.   Nauseas, Zofran ODT expensive and not helping that much. Notices nausea every other day and it depends on what she is eating.  Needs eye doctor visit: Vision is "weak" ROS: nausea, no cp, no sob, no polyuria  # Depression  Feels like the medicine is working "perfectly"  Says husband wanted to be here to thank for starting her on medicine  Mood is "good"  No anxiety attacks ROS: No SI  # Back and knee pain  Had been on lidocaine patches but can't afford  Tried mother's lidocaine ointment and it helped a lot  # Sinus pressure  Going on for "years"  Has allergies but not currently taking anything  Worsened recently and has developed pain in cheekbones, mucousy discharge ROS: no fevers, no chills  Review of Systems   See HPI for ROS. All other systems reviewed and are negative.  Past medical history, surgical, family, and social history reviewed and updated in the EMR as appropriate. Objective:  BP 131/83 mmHg  Pulse 84  Temp(Src) 98.6 F (37 C) (Oral)  Ht 5\' 3"  (1.6 m)  Wt 217 lb (98.431 kg)  BMI 38.45 kg/m2 Vitals and nursing note reviewed  General: NAD Eyes: PERRL, EOMI. ENTM: Clear discharge in nares. Tender to palpation maxillary sinuses bilaterally. MMM. CV: RRR, nl s1s2 no mrg Resp: CTAB nl effort Ext: no edema or cyanosis  Assessment & Plan:  See Problem List Documentation

## 2014-06-24 NOTE — Assessment & Plan Note (Signed)
Likely bacterial sinus infection. Pt reports allergy to penicillin was small rash, discussed reaction with augmentin and pt wishes to try it as did not get good relief with other antibiotics for past sinus infections. Discussed if reaction more than bothersome to stop and call clinic. Augmentin BID x 7 days.

## 2014-06-24 NOTE — Assessment & Plan Note (Signed)
Reports significant improvement since starting prozac. Will continue this, refills sent.

## 2014-06-24 NOTE — Patient Instructions (Addendum)
Diabetes: If you are able to pickup the Lantus: Start with 15 units daily in the morning. Continue measuring your sugar every morning. Your target goal for fasting blood sugar right now is 100-130. If your sugars continue to be higher than this, every 3 days you can increase the lantus by 2 units.  If your sugar is <70 in the morning, you need to decrease to decrease by 2 units  Augmentin for your sinus infection  Refills sent for prozac.

## 2014-07-01 ENCOUNTER — Ambulatory Visit: Payer: Medicare Other | Admitting: Internal Medicine

## 2014-07-03 ENCOUNTER — Ambulatory Visit (INDEPENDENT_AMBULATORY_CARE_PROVIDER_SITE_OTHER): Payer: Medicare Other | Admitting: Pharmacist

## 2014-07-03 ENCOUNTER — Encounter: Payer: Self-pay | Admitting: Pharmacist

## 2014-07-03 VITALS — BP 107/71 | HR 66 | Ht 63.39 in | Wt 216.0 lb

## 2014-07-03 DIAGNOSIS — Z720 Tobacco use: Secondary | ICD-10-CM

## 2014-07-03 DIAGNOSIS — IMO0002 Reserved for concepts with insufficient information to code with codable children: Secondary | ICD-10-CM

## 2014-07-03 DIAGNOSIS — E1165 Type 2 diabetes mellitus with hyperglycemia: Secondary | ICD-10-CM

## 2014-07-03 DIAGNOSIS — F17200 Nicotine dependence, unspecified, uncomplicated: Secondary | ICD-10-CM

## 2014-07-03 MED ORDER — VARENICLINE TARTRATE 1 MG PO TABS: 1.0000 mg | ORAL_TABLET | Freq: Two times a day (BID) | ORAL | Status: DC

## 2014-07-03 NOTE — Progress Notes (Signed)
Patient ID: Theresa [name], female   DOB: 1965-07-04, 49 y.o.   MRN: [id number] Reviewed: Agree with Dr. Graylin Shiver documentation and management.

## 2014-07-03 NOTE — Assessment & Plan Note (Signed)
Long standing history of T2DM currently with improved control with decreased peripheral neuropathy, itching, and nocturia since starting lantus.   Denies hypoglycemic events and is able to verbalize appropriate hypoglycemia management plan.  Plan is to continue Lantus pen 10 units daily. Next A1C anticipated in May 2016

## 2014-07-03 NOTE — Patient Instructions (Addendum)
It was great to see you today!  Continue to take Lantus 10 units injection every day. If your blood sugars are greater than 200, come see [place]. If your blood sugars are less than 80-90, come see [place].  You are doing great with your diet and exercise- keep it up.  Your goal is to quit smoking and we are going to do it!!! By May 5th, you will be smoking no more than 2 cigarettes per day! Call 1800-QUIT-NOW when you feel like you need to talk to someone. Continue to take your Chantix.  Follow up with Pharmacy Clinic with Dr. Valentina Lucks around May 21st.

## 2014-07-03 NOTE — Assessment & Plan Note (Addendum)
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  Pt set a goal of smoking no more than 2 cigarettes per day by Jul 17, 2014 and having an ultimate quit date by Aug 02, 2014. Continue to take Chantix '1mg'$  BID.  Provided information on 1 800-QUIT NOW support program.

## 2014-07-03 NOTE — Progress Notes (Signed)
S:    Patient arrives in good spirits today ambulating by self but needing assistance due to vision problems.   Presents for diabetes management and assistance with tobacco dependence.   Patient reports having history of Diabetes since before 2012. Suffers from tobacco abuse for 'many' years.  Patient reports adherence with medications.  Current diabetes medications include: insulin glargine (Solostar Pen) 10 units every morning. She demonstrates proper injection technique.   Patient reported dietary habits include eating carbs in proportion and snacks on lots of fruits and veggies. Patient reported exercise habits include walking with husband and doing choirs around house. Pt is limited due to back surgery but exercises as tolerated.    Patient denies hypoglycemic events. Patient denies nocturia. Patient denies neuropathy. Patient has visual problems but is at baseline.   Pt has been smoking for 'many' years and has been on Chantix '1mg'$  BID for many years.  Pt previously smoked 1ppd and is now down to 4 cigarettes per day. Triggers include eating and stress of taking care of mom that lives in [place].    Pt has recurrent NSCLC initially diagnosed in 2009 and is currently under observational therapy.    O:  Lab Results  Component Value Date   HGBA1C >15.0 05/12/2014  Home fasting CBG: 160-170s 2 hour post-prandial/random CBG: 130-140s  A/P: Long standing history of T2DM currently with improved control with decreased peripheral neuropathy, itching, and nocturia since starting lantus.   Denies hypoglycemic events and is able to verbalize appropriate hypoglycemia management plan.  Plan is to continue Lantus pen 10 units daily. 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  Pt set a goal of smoking no more than 2 cigarettes per day by Jul 17, 2014 and  having an ultimate quit date by Aug 02, 2014. Continue to take Chantix '1mg'$  BID.  Provided information on 1 800-QUIT NOW support program.   Written patient instructions provided. Follow up in Pharmacist Clinic Visit around Aug 02, 2014.   Total time in face to face counseling 50 minutes.  Patient seen with Eunice Blase, PharmD Candidate.

## 2014-07-14 ENCOUNTER — Telehealth: Payer: Self-pay | Admitting: Internal Medicine

## 2014-07-14 NOTE — Telephone Encounter (Signed)
Patient called to r/s march f/u appointment. Patient have lab/ct but was unable to keep f/u. Gave patient new appointment for 6/1.

## 2014-07-15 ENCOUNTER — Other Ambulatory Visit: Payer: Self-pay | Admitting: Family Medicine

## 2014-07-15 NOTE — Telephone Encounter (Signed)
Pt is calling for a refill on her BP medication. jw

## 2014-07-15 NOTE — Telephone Encounter (Signed)
Refill message sent to MD.  Theresa [name]

## 2014-07-26 ENCOUNTER — Other Ambulatory Visit: Payer: Self-pay | Admitting: Family Medicine

## 2014-07-31 ENCOUNTER — Encounter: Payer: Self-pay | Admitting: Pharmacist

## 2014-07-31 ENCOUNTER — Ambulatory Visit (INDEPENDENT_AMBULATORY_CARE_PROVIDER_SITE_OTHER): Payer: Medicare Other | Admitting: Pharmacist

## 2014-07-31 VITALS — BP 141/88 | HR 75 | Ht 62.0 in | Wt 218.1 lb

## 2014-07-31 DIAGNOSIS — F17200 Nicotine dependence, unspecified, uncomplicated: Secondary | ICD-10-CM

## 2014-07-31 DIAGNOSIS — I10 Essential (primary) hypertension: Secondary | ICD-10-CM

## 2014-07-31 DIAGNOSIS — Z720 Tobacco use: Secondary | ICD-10-CM | POA: Diagnosis not present

## 2014-07-31 DIAGNOSIS — R1013 Epigastric pain: Secondary | ICD-10-CM

## 2014-07-31 DIAGNOSIS — J321 Chronic frontal sinusitis: Secondary | ICD-10-CM

## 2014-07-31 DIAGNOSIS — E7800 Pure hypercholesterolemia, unspecified: Secondary | ICD-10-CM

## 2014-07-31 DIAGNOSIS — E1165 Type 2 diabetes mellitus with hyperglycemia: Secondary | ICD-10-CM | POA: Diagnosis not present

## 2014-07-31 DIAGNOSIS — F329 Major depressive disorder, single episode, unspecified: Secondary | ICD-10-CM

## 2014-07-31 DIAGNOSIS — F32A Depression, unspecified: Secondary | ICD-10-CM

## 2014-07-31 DIAGNOSIS — E785 Hyperlipidemia, unspecified: Secondary | ICD-10-CM

## 2014-07-31 DIAGNOSIS — IMO0002 Reserved for concepts with insufficient information to code with codable children: Secondary | ICD-10-CM

## 2014-07-31 LAB — BASIC METABOLIC PANEL
BUN: 13 mg/dL (ref 6–23)
CO2: 27 mEq/L (ref 19–32)
Calcium: 9.4 mg/dL (ref 8.4–10.5)
Chloride: 102 mEq/L (ref 96–112)
Creat: 1.41 mg/dL — ABNORMAL HIGH (ref 0.50–1.10)
Glucose, Bld: 158 mg/dL — ABNORMAL HIGH (ref 70–99)
Potassium: 3.5 mEq/L (ref 3.5–5.3)
Sodium: 137 mEq/L (ref 135–145)

## 2014-07-31 LAB — LIPID PANEL
Cholesterol: 182 mg/dL (ref 0–200)
HDL: 31 mg/dL — ABNORMAL LOW (ref >=46)
LDL Cholesterol: 114 mg/dL — ABNORMAL HIGH (ref 0–99)
Total CHOL/HDL Ratio: 5.9 Ratio
Triglycerides: 185 mg/dL — ABNORMAL HIGH (ref <150)
VLDL: 37 mg/dL (ref 0–40)

## 2014-07-31 LAB — POCT GLYCOSYLATED HEMOGLOBIN (HGB A1C): Hemoglobin A1C: 9.7

## 2014-07-31 MED ORDER — SUCRALFATE 1 GM/10ML PO SUSP: ORAL | Status: DC

## 2014-07-31 MED ORDER — ATORVASTATIN CALCIUM 40 MG PO TABS: 40.0000 mg | ORAL_TABLET | Freq: Every day | ORAL | Status: DC

## 2014-07-31 MED ORDER — IPRATROPIUM BROMIDE 0.06 % NA SOLN: 2.0000 | Freq: Every day | NASAL | Status: DC

## 2014-07-31 MED ORDER — FLUOXETINE HCL 20 MG PO TABS: 20.0000 mg | ORAL_TABLET | Freq: Every day | ORAL | Status: DC

## 2014-07-31 MED ORDER — OMEPRAZOLE 40 MG PO CPDR: DELAYED_RELEASE_CAPSULE | ORAL | Status: DC

## 2014-07-31 MED ORDER — GABAPENTIN 300 MG PO CAPS: 300.0000 mg | ORAL_CAPSULE | Freq: Three times a day (TID) | ORAL | Status: DC

## 2014-07-31 MED ORDER — AMLODIPINE BESYLATE 10 MG PO TABS: 10.0000 mg | ORAL_TABLET | Freq: Every morning | ORAL | Status: DC

## 2014-07-31 NOTE — Assessment & Plan Note (Addendum)
Diabetes diagnosed around 2010 currently suboptimally controlled with substantial improvement from baseline.   Denies hypoglycemic events and is able to verbalize appropriate hypoglycemia management plan.  Reports adherence with medication. Increased dose of basal insulin Lantus (insulin glargine) to 15 units qAM. Instructed to try to take post-prandial blood glucose readings.  Next A1C anticipated late August.

## 2014-07-31 NOTE — Progress Notes (Signed)
Patient ID: Theresa [name], female   DOB: [birth year], 49 y.o.   MRN: [id number] Reviewed: Agree with Dr. Graylin Shiver documentation and management.

## 2014-07-31 NOTE — Assessment & Plan Note (Signed)
d 

## 2014-07-31 NOTE — Assessment & Plan Note (Signed)
Refilled ipratropium

## 2014-07-31 NOTE — Assessment & Plan Note (Signed)
Refilled Omeprazole and Sucralfate

## 2014-07-31 NOTE — Progress Notes (Signed)
S:    Patient arrives in fair spirits ambulating with cane.    Presents for diabetes and smoking cessation follow up. Patient reports having history of Diabetes since the year of 2009.   Patient reports adherence with medications, but difficulty affording medications, and is out of several. Current diabetes medications include Lantus 10 units qAM.   Patient denies hypoglycemic events.  Patient reported dietary habits: has orange juice, snacks, peanut butter, and bananas available for treatment of hypoglycemia.   Patient reports nocturia--3 times each night. Patient denies neuropathy. Patient reports visual changes.   Patient has not been able to refill Chantix due to cost, but reports that she can refill next week. Reports smoking 5-6 cigarettes per day, but that she is trying hard to quit.  O:  . Lab Results  Component Value Date   HGBA1C 9.7 07/31/2014     Home fasting CBG: 138, 153, 160  2 hour post-prandial/random CBG: Does not measure regularly.  A/P: Diabetes diagnosed around 2010 currently suboptimally controlled with substantial improvement from baseline.   Denies hypoglycemic events and is able to verbalize appropriate hypoglycemia management plan.  Reports adherence with medication. Increased dose of basal insulin Lantus (insulin glargine) to 15 units qAM. Instructed to try to take post-prandial blood glucose readings.  Next A1C anticipated late August.  Moderate tobacco dependence for which patient is a fair candidate for success due to thorough readiness to quit.  Instructed patient to refill Chantix 1 mg daily and begin taking.  Written patient instructions provided.  Follow up in Pharmacist Clinic Visit 08/14/14.   Total time in face to face counseling 40 minutes.  Patient seen with Jonna Munro, PharmD Candidate.

## 2014-07-31 NOTE — Patient Instructions (Addendum)
Please pick up your Chantix and start taking one pill daily.    Continue checking your blood sugar in the morning, and start checking your blood sugar at mealtimes and at bedtime.  Increase your dose of Lantus Insulin to 15 units every morning.  We look forward to seeing you again in about 2 weeks!

## 2014-07-31 NOTE — Assessment & Plan Note (Signed)
Moderate tobacco dependence for which patient is a fair candidate for success due to thorough readiness to quit.  Instructed patient to refill Chantix 1 mg daily and begin taking.

## 2014-08-13 ENCOUNTER — Ambulatory Visit (HOSPITAL_BASED_OUTPATIENT_CLINIC_OR_DEPARTMENT_OTHER): Payer: Medicare Other | Admitting: Internal Medicine

## 2014-08-13 ENCOUNTER — Encounter: Payer: Self-pay | Admitting: Internal Medicine

## 2014-08-13 ENCOUNTER — Telehealth: Payer: Self-pay | Admitting: Internal Medicine

## 2014-08-13 VITALS — BP 106/60 | HR 64 | Temp 98.1°F | Resp 18 | Ht 62.0 in | Wt 219.1 lb

## 2014-08-13 DIAGNOSIS — F17219 Nicotine dependence, cigarettes, with unspecified nicotine-induced disorders: Secondary | ICD-10-CM

## 2014-08-13 DIAGNOSIS — C3492 Malignant neoplasm of unspecified part of left bronchus or lung: Secondary | ICD-10-CM

## 2014-08-13 DIAGNOSIS — C3432 Malignant neoplasm of lower lobe, left bronchus or lung: Secondary | ICD-10-CM | POA: Diagnosis not present

## 2014-08-13 DIAGNOSIS — D751 Secondary polycythemia: Secondary | ICD-10-CM | POA: Diagnosis not present

## 2014-08-13 NOTE — Telephone Encounter (Signed)
per pof to sch pt appt-gave pt copy of sch-adv [facility] will call to sch scan

## 2014-08-13 NOTE — Patient Instructions (Signed)
Smoking Cessation Quitting smoking is important to your health and has many advantages. However, it is not always easy to quit since nicotine is a very addictive drug. Oftentimes, people try 3 times or more before being able to quit. This document explains the best ways for you to prepare to quit smoking. Quitting takes hard work and a lot of effort, but you can do it. ADVANTAGES OF QUITTING SMOKING  You will live longer, feel better, and live better.  Your body will feel the impact of quitting smoking almost immediately.  Within 20 minutes, blood pressure decreases. Your pulse returns to its normal level.  After 8 hours, carbon monoxide levels in the blood return to normal. Your oxygen level increases.  After 24 hours, the chance of having a heart attack starts to decrease. Your breath, hair, and body stop smelling like smoke.  After 48 hours, damaged nerve endings begin to recover. Your sense of taste and smell improve.  After 72 hours, the body is virtually free of nicotine. Your bronchial tubes relax and breathing becomes easier.  After 2 to 12 weeks, lungs can hold more air. Exercise becomes easier and circulation improves.  The risk of having a heart attack, stroke, cancer, or lung disease is greatly reduced.  After 1 year, the risk of coronary heart disease is cut in half.  After 5 years, the risk of stroke falls to the same as a nonsmoker.  After 10 years, the risk of lung cancer is cut in half and the risk of other cancers decreases significantly.  After 15 years, the risk of coronary heart disease drops, usually to the level of a nonsmoker.  If you are pregnant, quitting smoking will improve your chances of having a healthy baby.  The people you live with, especially any children, will be healthier.  You will have extra money to spend on things other than cigarettes. QUESTIONS TO THINK ABOUT BEFORE ATTEMPTING TO QUIT You may want to talk about your answers with your  health care provider.  Why do you want to quit?  If you tried to quit in the past, what helped and what did not?  What will be the most difficult situations for you after you quit? How will you plan to handle them?  Who can help you through the tough times? Your family? Friends? A health care provider?  What pleasures do you get from smoking? What ways can you still get pleasure if you quit? Here are some questions to ask your health care provider:  How can you help me to be successful at quitting?  What medicine do you think would be best for me and how should I take it?  What should I do if I need more help?  What is smoking withdrawal like? How can I get information on withdrawal? GET READY  Set a quit date.  Change your environment by getting rid of all cigarettes, ashtrays, matches, and lighters in your home, car, or work. Do not let people smoke in your home.  Review your past attempts to quit. Think about what worked and what did not. GET SUPPORT AND ENCOURAGEMENT You have a better chance of being successful if you have help. You can get support in many ways.  Tell your family, friends, and coworkers that you are going to quit and need their support. Ask them not to smoke around you.  Get individual, group, or telephone counseling and support. Programs are available at local hospitals and health centers. Call   your local health department for information about programs in your area.  Spiritual beliefs and practices may help some smokers quit.  Download a "quit meter" on your computer to keep track of quit statistics, such as how long you have gone without smoking, cigarettes not smoked, and money saved.  Get a self-help book about quitting smoking and staying off tobacco. LEARN NEW SKILLS AND BEHAVIORS  Distract yourself from urges to smoke. Talk to someone, go for a walk, or occupy your time with a task.  Change your normal routine. Take a different route to work.  Drink tea instead of coffee. Eat breakfast in a different place.  Reduce your stress. Take a hot bath, exercise, or read a book.  Plan something enjoyable to do every day. Reward yourself for not smoking.  Explore interactive web-based programs that specialize in helping you quit. GET MEDICINE AND USE IT CORRECTLY Medicines can help you stop smoking and decrease the urge to smoke. Combining medicine with the above behavioral methods and support can greatly increase your chances of successfully quitting smoking.  Nicotine replacement therapy helps deliver nicotine to your body without the negative effects and risks of smoking. Nicotine replacement therapy includes nicotine gum, lozenges, inhalers, nasal sprays, and skin patches. Some may be available over-the-counter and others require a prescription.  Antidepressant medicine helps people abstain from smoking, but how this works is unknown. This medicine is available by prescription.  Nicotinic receptor partial agonist medicine simulates the effect of nicotine in your brain. This medicine is available by prescription. Ask your health care provider for advice about which medicines to use and how to use them based on your health history. Your health care provider will tell you what side effects to look out for if you choose to be on a medicine or therapy. Carefully read the information on the package. Do not use any other product containing nicotine while using a nicotine replacement product.  RELAPSE OR DIFFICULT SITUATIONS Most relapses occur within the first 3 months after quitting. Do not be discouraged if you start smoking again. Remember, most people try several times before finally quitting. You may have symptoms of withdrawal because your body is used to nicotine. You may crave cigarettes, be irritable, feel very hungry, cough often, get headaches, or have difficulty concentrating. The withdrawal symptoms are only temporary. They are strongest  when you first quit, but they will go away within 10-14 days. To reduce the chances of relapse, try to:  Avoid drinking alcohol. Drinking lowers your chances of successfully quitting.  Reduce the amount of caffeine you consume. Once you quit smoking, the amount of caffeine in your body increases and can give you symptoms, such as a rapid heartbeat, sweating, and anxiety.  Avoid smokers because they can make you want to smoke.  Do not let weight gain distract you. Many smokers will gain weight when they quit, usually less than 10 pounds. Eat a healthy diet and stay active. You can always lose the weight gained after you quit.  Find ways to improve your mood other than smoking. FOR MORE INFORMATION  www.smokefree.gov  Document Released: 02/22/2001 Document Revised: 07/15/2013 Document Reviewed: 06/09/2011 Exit[employer] Patient Information 2015 [employer]. This information is not intended to replace advice given to you by your health care provider. Make sure you discuss any questions you have with your health care provider.  

## 2014-08-13 NOTE — Progress Notes (Signed)
[facility] Telephone:([phone]   Fax:([phone]) [place], [place] [address]  DIAGNOSIS: Recurrent non-small cell lung cancer initially diagnosed as stage IA (T1b, N0, MX) adenocarcinoma in November 2009.   PRIOR THERAPY:  #1 Status post left upper lobe trisegmentectomy with lymph node dissection under the care of Dr. Arlyce Dice on March 24, 2009.  #2 Concurrent chemoradiation with carboplatin for AUC of 2 and paclitaxel 45 mg/M2 given weekly with radiation.   CURRENT THERAPY: Observation.  CHEMOTHERAPY INTENT: Curative  CURRENT # OF CHEMOTHERAPY CYCLES: 0  CURRENT ANTIEMETICS: None  CURRENT SMOKING STATUS: Current smoker and I strongly encouraged her to quit smoking and altered her to smoke cessation program.  ORAL CHEMOTHERAPY AND CONSENT: None  CURRENT BISPHOSPHONATES USE: None  PAIN MANAGEMENT: 0/10  NARCOTICS INDUCED CONSTIPATION: None  LIVING WILL AND CODE STATUS: No CODE BLUE   INTERVAL HISTORY: Theresa [name] 49 y.o. female returns to the clinic today for six-month followup visit accompanied by her daughter. She is currently on observation. The patient is feeling fine today with no specific complaints. She has no chest pain, shortness of breath, cough or hemoptysis. She denied having any significant fever or chills, no nausea or vomiting. No significant weight loss or night sweats. The patient denied having any fatigue or weakness. She had repeat CT scan of the chest performed in March 2016 but she missed her follow-up appointment because she was in [place] taken care of her mother. She is here for evaluation and discussion of her scan results.  MEDICAL HISTORY: Past Medical History  Diagnosis Date  . Hypertension   . Hyperlipidemia   . Blindness, legal     glaucoma and retinitis  pigmentosa  . Glaucoma   . Onychomycosis   . Retinitis pigmentosa   . Chronic eczema   . Anxiety   . Hx  of radiation therapy 01-17-11 to 03-18-11    lung  . History of chemotherapy 01/2011 to 03/2011    concurrent w/radiation therapy  . Chronic headaches 10-17-11    migraines  . Sleep apnea     cpap 5 yrs  . Asthma   . GERD (gastroesophageal reflux disease)   . Arthritis   . Family history of malignant hyperthermia     1 son 16 years ago  . Shortness of breath     with exertion  . Depression   . Diabetes mellitus type II     diet   . Constipation   . Fibromyalgia     denies, has not been diagnosited  . Lung cancer     lung ca dx 2010  . Family history of anesthesia complication     1 son had malignant hyperthermia at 59yr  tonsils rained    ALLERGIES:  is allergic to codeine; morphine and related; penicillins; metformin and related; and tomato.  MEDICATIONS:  Current Outpatient Prescriptions  Medication Sig Dispense Refill  . albuterol (PROVENTIL HFA;VENTOLIN HFA) 108 (90 BASE) MCG/ACT inhaler Inhale 2 puffs into the lungs every 6 (six) hours as needed for wheezing or shortness of breath. 1 Inhaler 0  . amLODipine (NORVASC) 10 MG tablet Take 1 tablet (10 mg total) by mouth every morning. 90 tablet 3  . atorvastatin (LIPITOR) 40 MG tablet Take 1 tablet (40 mg total) by mouth daily. 90 tablet 3  . brimonidine (ALPHAGAN) 0.15 % ophthalmic solution Place 1 drop into both eyes 2 (two)  times daily.    . carvedilol (COREG) 25 MG tablet TAKE 1 TABLET BY MOUTH TWICE DAILY WITH A MEAL 180 tablet 3  . clobetasol cream (TEMOVATE) 1.61 % Apply 1 application topically daily. 45 g 1  . FLUoxetine (PROZAC) 20 MG tablet Take 1 tablet (20 mg total) by mouth daily. 30 tablet 1  . fluticasone (FLONASE) 50 MCG/ACT nasal spray Place 2 sprays into both nostrils daily. (Patient taking differently: Place 2 sprays into both nostrils 2 (two) times daily as needed for allergies. ) 16 g 6  . gabapentin (NEURONTIN) 300 MG capsule Take 1 capsule (300 mg total) by mouth 3 (three) times daily. 270 capsule 3  .  Insulin Glargine (LANTUS SOLOSTAR) 100 UNIT/ML Solostar Pen Inject 10 units every morning 15 mL 3  . Insulin Pen Needle (BD ULTRA-FINE PEN NEEDLES) 29G X 12.7MM MISC Use to inject insulin daily 100 each 1  . ipratropium (ATROVENT) 0.06 % nasal spray Place 2 sprays into both nostrils daily. 15 mL 12  . lidocaine (XYLOCAINE) 5 % ointment Apply 1 application topically daily as needed. 35.44 g 1  . Linaclotide (LINZESS) 290 MCG CAPS capsule Take 1 capsule (290 mcg total) by mouth daily. 30 capsule 1  . omeprazole (PRILOSEC) 40 MG capsule Take 1 tablet twice daily before a meal. 60 capsule 3  . PRESCRIPTION MEDICATION Apply 1 drop to eye at bedtime.    . sucralfate (CARAFATE) 1 GM/10ML suspension Take 1 gram twice daily before breakfast and dinner. 420 mL 2  . valsartan-hydrochlorothiazide (DIOVAN-HCT) 160-12.5 MG per tablet Take 1 tablet by mouth every morning.     . varenicline (CHANTIX) 1 MG tablet Take 1 tablet (1 mg total) by mouth 2 (two) times daily. 60 tablet 1   No current facility-administered medications for this visit.    SURGICAL HISTORY:  Past Surgical History  Procedure Laterality Date  . Lung surgery  03/24/08    L vats, L thoracotomy and LUL trisegmentectomy with node dissection  . Chest tube insertion   04/01/08    L hydropneumothorax  . Portacath placement  02/11/2011    Procedure: INSERTION PORT-A-CATH;  Surgeon: Pierre Bali, MD;  Location: [facility];  Service: Thoracic;  Laterality: Right;  9.6Fr. Pre-attached Power Port in Right Internal Jugular  -right chest-remains inplace 10-17-11  . Knee arthroscopy  10-17-11    bil. knee scope(left was torn ligament)  . Lumbar laminectomy/decompression microdiscectomy  10/27/2011    Procedure: LUMBAR LAMINECTOMY/DECOMPRESSION MICRODISCECTOMY;  Surgeon: Johnn Hai, MD;  Location: [facility];  Service: Orthopedics;  Laterality: N/A;  L4-L5  . Abdominal hysterectomy  09/19/2001    hx. of fibroids. TAH. Ovaries remain.   . Eye surgery  Bilateral     lens implant  . Maximum access (mas)posterior lumbar interbody fusion (plif) 1 level N/A 07/03/2013    Procedure: FOR MAXIMUM ACCESS (MAS) POSTERIOR LUMBAR INTERBODY FUSION (PLIF) 1 LEVEL;  Surgeon: Eustace Moore, MD;  Location: [facility];  Service: Neurosurgery;  Laterality: N/A;  FOR MAXIMUM ACCESS (MAS) POSTERIOR LUMBAR INTERBODY FUSION (PLIF) 1 LEVEL (L4-L5)  . Back surgery  06/2013  . Port-a-cath removal Right 11/26/2013    Procedure: REMOVAL PORT-A-CATH;  Surgeon: Gaye Pollack, MD;  Location: [facility];  Service: Thoracic;  Laterality: Right;    REVIEW OF SYSTEMS:  A comprehensive review of systems was negative.   PHYSICAL EXAMINATION: General appearance: alert, cooperative and no distress Head: Normocephalic, without obvious abnormality, atraumatic Neck: no adenopathy Lymph nodes:  Cervical, supraclavicular, and axillary nodes normal. Resp: clear to auscultation bilaterally Cardio: regular rate and rhythm, S1, S2 normal, no murmur, click, rub or gallop GI: soft, non-tender; bowel sounds normal; no masses,  no organomegaly Extremities: extremities normal, atraumatic, no cyanosis or edema  ECOG PERFORMANCE STATUS: 1 - Symptomatic but completely ambulatory  Blood pressure 106/60, pulse 64, temperature 98.1 F (36.7 C), temperature source Oral, resp. rate 18, height '5\' 2"'$  (1.575 m), weight 219 lb 1.6 oz (99.383 kg), SpO2 99 %.  LABORATORY DATA: Lab Results  Component Value Date   WBC 8.0 05/20/2014   HGB 18.2* 05/20/2014   HCT 52.9* 05/20/2014   MCV 90.0 05/20/2014   PLT 204 05/20/2014      Chemistry      Component Value Date/Time   NA 137 07/31/2014 1004   NA 139 05/20/2014 0843   NA 141 07/14/2011 0934   K 3.5 07/31/2014 1004   K 3.5 05/20/2014 0843   K 3.4 07/14/2011 0934   CL 102 07/31/2014 1004   CL 102 01/18/2012 0805   CL 100 07/14/2011 0934   CO2 27 07/31/2014 1004   CO2 26 05/20/2014 0843   CO2 30 07/14/2011 0934   BUN 13 07/31/2014 1004   BUN  8.3 05/20/2014 0843   BUN 13 07/14/2011 0934   CREATININE 1.41* 07/31/2014 1004   CREATININE 1.6* 05/20/2014 0843   CREATININE 1.30* 11/26/2013 0801      Component Value Date/Time   CALCIUM 9.4 07/31/2014 1004   CALCIUM 9.4 05/20/2014 0843   CALCIUM 8.7 07/14/2011 0934   ALKPHOS 67 05/20/2014 0843   ALKPHOS 69 11/26/2013 0801   ALKPHOS 38 07/14/2011 0934   AST 13 05/20/2014 0843   AST 14 11/26/2013 0801   AST 19 07/14/2011 0934   ALT 17 05/20/2014 0843   ALT 17 11/26/2013 0801   ALT 20 07/14/2011 0934   BILITOT 1.11 05/20/2014 0843   BILITOT 0.6 11/26/2013 0801   BILITOT 1.20 07/14/2011 0934       RADIOGRAPHIC STUDIES: Ct Chest W Contrast  05/20/2014   CLINICAL DATA:  Subsequent treatment strategy for lung cancer. Left upper lobe lobectomy 2010.  EXAM: CT CHEST WITH CONTRAST  TECHNIQUE: Multidetector CT imaging of the chest was performed during intravenous contrast administration.  CONTRAST:  59m OMNIPAQUE IOHEXOL 300 MG/ML  SOLN  Comparison:  Chest CT 11/12/2013  FINDINGS: Mediastinum/Nodes: No axillary or supraclavicular lymphadenopathy. No mediastinal hilar lymphadenopathy. No pericardial fluid.  Lungs/Pleura: Volume loss in the left hemi thorax. There is consolidation in the left upper lobe with air bronchograms and surgical material. This consolidative pattern which is not changed from comparison exam. No measurable nodularity.  Upper abdomen: Limited view of the liver, kidneys, pancreas are unremarkable. Normal adrenal glands.  Musculoskeletal: No aggressive osseous lesion.  IMPRESSION: 1. Stable consolidation in the left upper lobe at site of surgery and radiotherapy. 2. No evidence of disease progression in the thorax.   Electronically Signed   By: SSuzy BouchardM.D.   On: 05/20/2014 12:28   ASSESSMENT AND PLAN: This is a very pleasant 49years old [place] female with recurrent non-small cell lung cancer status post concurrent chemoradiation and currently on  observation with no evidence for disease progression. I discussed the scan results with the patient. I recommended for her to continue on observation with repeat CT scan of the chest in 6 months. For the polycythemia, this is most likely reactive in nature secondary to persistent smoking and COPD and  we will continue to monitor it closely. For smoke cessation, I strongly encouraged the patient to quit smoking and offered her smoke cessation program. She was advised to call immediately if she has any concerning symptoms in the interval. The patient voices understanding of current disease status and treatment options and is in agreement with the current care plan.  All questions were answered. The patient knows to call the clinic with any problems, questions or concerns. We can certainly see the patient much sooner if necessary.  Disclaimer: This note was dictated with voice recognition software. Similar sounding words can inadvertently be transcribed and may not be corrected upon review.

## 2014-08-14 ENCOUNTER — Ambulatory Visit (INDEPENDENT_AMBULATORY_CARE_PROVIDER_SITE_OTHER): Payer: Medicare Other | Admitting: Pharmacist

## 2014-08-14 VITALS — BP 116/72 | HR 65 | Ht 63.0 in | Wt 219.7 lb

## 2014-08-14 DIAGNOSIS — Z720 Tobacco use: Secondary | ICD-10-CM

## 2014-08-14 DIAGNOSIS — F17200 Nicotine dependence, unspecified, uncomplicated: Secondary | ICD-10-CM

## 2014-08-14 DIAGNOSIS — I10 Essential (primary) hypertension: Secondary | ICD-10-CM | POA: Diagnosis not present

## 2014-08-14 DIAGNOSIS — E1165 Type 2 diabetes mellitus with hyperglycemia: Secondary | ICD-10-CM

## 2014-08-14 DIAGNOSIS — L659 Nonscarring hair loss, unspecified: Secondary | ICD-10-CM

## 2014-08-14 DIAGNOSIS — F330 Major depressive disorder, recurrent, mild: Secondary | ICD-10-CM | POA: Diagnosis not present

## 2014-08-14 DIAGNOSIS — IMO0002 Reserved for concepts with insufficient information to code with codable children: Secondary | ICD-10-CM

## 2014-08-14 MED ORDER — VALSARTAN 160 MG PO TABS: 160.0000 mg | ORAL_TABLET | Freq: Every day | ORAL | Status: DC

## 2014-08-14 NOTE — Patient Instructions (Addendum)
Stop Valsartan HCTZ combination  Start Valsartan daily in its place!!!!  Quit smoking in the next 10-14 days.   Follow up Pharmacy Clinic in 2 weeks.

## 2014-08-15 ENCOUNTER — Encounter: Payer: Self-pay | Admitting: Pharmacist

## 2014-08-15 DIAGNOSIS — L659 Nonscarring hair loss, unspecified: Secondary | ICD-10-CM | POA: Insufficient documentation

## 2014-08-15 NOTE — Assessment & Plan Note (Signed)
mild Nicotine Dependence in a patient who is excellent candidate for success b/c of ability to cut down to only 2 cigarettes per day.  Encouragement for continued progress provided.  Quit smoking in the next 10-14 days.   Follow up Pharmacy Clinic in 2 weeks.  Continue varenicline at this time.  Patient notes improvement in mood with increase in fluoxetine recently.    F/U Rx Clinic Visit in 2 weeks Total time in face-to-face counseling 15 minutes.  Patient seen with Nilsa Nutting, PharmD Candidate and Elenor Quinones, PharmD Resident.

## 2014-08-15 NOTE — Progress Notes (Signed)
Patient ID: Theresa [name], female   DOB: 21-Nov-1965, 49 y.o.   MRN: [id number] Reviewed: Agree with Dr. Graylin Shiver documentation and management.

## 2014-08-15 NOTE — Assessment & Plan Note (Signed)
Hair Loss/ Hx of hypertension - New complaint of hair loss in patient with blood pressure of 116/72 (at goal).    Will attempt trial off HCTZ first.   Discontinued combination valsartan/HCTZ and reordered valsartan '160mg'$  daily.    Reassess hair loss complaint and BP at next visit.   Consider possibility of beta blocker induced hair loss as well in the future.   Other reasons for hair loss may also need to be evaluated at upcoming PCP visits.

## 2014-08-15 NOTE — Assessment & Plan Note (Signed)
Diabetes Control improved and near goal with average CBGs of 157.   Likely to have A1c < 8 at next check.   May consider meal time insulin if A1c remains elevated at next evaluation.

## 2014-08-15 NOTE — Progress Notes (Signed)
S:  Patient arrives in great spirits walking without assistance.  Patient arrives for evaluation/assistance with tobacco dependence, diabetes follow up and new complaint of hair loss "coming out in handfulls"  Number of Cigarettes per day 2. 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  Encouragement for continued progress provided.  Quit smoking in the next 10-14 days.   Follow up Pharmacy Clinic in 2 weeks.  Continue varenicline at this time.  Patient notes improvement in mood with increase in fluoxetine recently.    F/U Rx Clinic Visit in 2 weeks Total time in face-to-face counseling 15 minutes.  Patient seen with Nilsa Nutting, PharmD Candidate and Elenor Quinones, PharmD Resident.  Diabetes Control improved and near goal with average CBGs of 157.   Likely to have A1c < 8 at next check.   May consider meal time insulin if A1c remains elevated at next evaluation.   Hair Loss/ Hx of hypertension - New complaint of hair loss in patient with blood pressure of 116/72 (at goal).    Will attempt trial off HCTZ first.   Discontinued combination valsartan/HCTZ and reordered valsartan '160mg'$  daily.    Reassess hair loss complaint and BP at next visit.   Consider possibility of beta blocker induced hair loss as well in the future.   Other reasons for hair loss may also need to be evaluated at upcoming PCP visits.

## 2014-08-15 NOTE — Assessment & Plan Note (Signed)
Patient notes improvement in mood with increase in fluoxetine recently.    No adverse mood effect noted from varenicline.

## 2014-11-06 ENCOUNTER — Ambulatory Visit (INDEPENDENT_AMBULATORY_CARE_PROVIDER_SITE_OTHER): Payer: Medicare Other | Admitting: Family Medicine

## 2014-11-06 ENCOUNTER — Encounter: Payer: Self-pay | Admitting: Family Medicine

## 2014-11-06 VITALS — BP 126/71 | HR 69 | Temp 97.4°F | Ht 63.0 in | Wt 220.7 lb

## 2014-11-06 DIAGNOSIS — K589 Irritable bowel syndrome without diarrhea: Secondary | ICD-10-CM | POA: Diagnosis not present

## 2014-11-06 DIAGNOSIS — R1012 Left upper quadrant pain: Secondary | ICD-10-CM | POA: Diagnosis not present

## 2014-11-06 DIAGNOSIS — N183 Chronic kidney disease, stage 3 unspecified: Secondary | ICD-10-CM

## 2014-11-06 DIAGNOSIS — K5900 Constipation, unspecified: Secondary | ICD-10-CM

## 2014-11-06 LAB — COMPREHENSIVE METABOLIC PANEL
ALT: 16 U/L (ref 6–29)
AST: 13 U/L (ref 10–35)
Albumin: 4.3 g/dL (ref 3.6–5.1)
Alkaline Phosphatase: 53 U/L (ref 33–130)
BUN: 14 mg/dL (ref 7–25)
CO2: 25 mmol/L (ref 20–31)
Calcium: 9.2 mg/dL (ref 8.6–10.4)
Chloride: 101 mmol/L (ref 98–110)
Creat: 1.39 mg/dL — ABNORMAL HIGH (ref 0.50–1.05)
Glucose, Bld: 265 mg/dL — ABNORMAL HIGH (ref 65–99)
Potassium: 3.6 mmol/L (ref 3.5–5.3)
Sodium: 137 mmol/L (ref 135–146)
Total Bilirubin: 0.8 mg/dL (ref 0.2–1.2)
Total Protein: 6.7 g/dL (ref 6.1–8.1)

## 2014-11-06 LAB — CBC
HCT: 47.6 % — ABNORMAL HIGH (ref 36.0–46.0)
Hemoglobin: 16.5 g/dL — ABNORMAL HIGH (ref 12.0–15.0)
MCH: 31 pg (ref 26.0–34.0)
MCHC: 34.7 g/dL (ref 30.0–36.0)
MCV: 89.3 fL (ref 78.0–100.0)
MPV: 11.1 fL (ref 8.6–12.4)
Platelets: 192 10*3/uL (ref 150–400)
RBC: 5.33 MIL/uL — ABNORMAL HIGH (ref 3.87–5.11)
RDW: 13.9 % (ref 11.5–15.5)
WBC: 7.7 10*3/uL (ref 4.0–10.5)

## 2014-11-06 LAB — LIPASE: Lipase: 68 U/L — ABNORMAL HIGH (ref 7–60)

## 2014-11-06 MED ORDER — LINACLOTIDE 290 MCG PO CAPS: 290.0000 ug | ORAL_CAPSULE | Freq: Every day | ORAL | Status: DC

## 2014-11-06 NOTE — Assessment & Plan Note (Signed)
Suspect functional dyspepsia and/or IBS, though EGD may be warranted for PUD r/o, last was normal in 2011. Refill linzess until she is able to be seen at GI. Check labs for infection and overall blood counts given h/o malignancy, CMP, lipase. No major risk factors for pancreatitis and would be very atypical presentation.

## 2014-11-06 NOTE — Progress Notes (Signed)
Subjective: Theresa [name] is a 49 y.o. female presenting for abdominal pain.   Upper abdominal pain radiating to the back for 2 weeks, worsening, comes and goes, but always in the same area. This is associated with nausea and NBNB vomiting, worse about 5 minutes after meals. She has been eating less as a result. No diarrhea - in fact has a little worsening of constipation and requests a refill on linzess which is prescribed by her GI doctor. She ran out of this about 2 days ago and says the cost to go to the GI doctor is too high for her. Symptoms are usually better when taking this. She takes prilosec daily for reflux and carafate qAC which do not seem to help.   She has a history of back surgery (fusion) in April 2016  - ROS: No fevers, chills, night sweats, dysphagia, weight loss. Denies abdominal trauma, palpitations, chest pain, dyspnea, unusual bleeding/bruising.  - Has a history of lung CA, ongoing treatment by Dr. Julien Nordmann.  - She is still smoking every now and then. No heavy EtOH history or gallstones. - Had vertical c-section years ago, also had a hysterectomy - EGD Feb 2011 by Dr. Henrene Pastor: impression states normal EGD, recommendation to continue PPI for GERD.   Objective: BP 126/71 mmHg  Pulse 69  Temp(Src) 97.4 F (36.3 C) (Oral)  Ht '5\' 3"'$  (1.6 m)  Wt 220 lb 11.2 oz (100.109 kg)  BMI 39.11 kg/m2 Gen: Obese, well-appearing 49 y.o. female in no distress CV: Regular rate, no murmur Pulm: Non-labored, clear throughout GI: Normoactive BS; soft, tender in LUQ most, also epigastric and LLQ mildly without rebound or rigidity, non-distended, no hepatic or splenic enlargement, no hernia appreciated.  Assessment/Plan: Theresa [name] is a 49 y.o. female here for LUQ abdominal pain.  See problem list for plan.

## 2014-11-06 NOTE — Patient Instructions (Signed)
-   Make an appointment with your GI doctor - I have refilled linzess - We will call you with results.

## 2014-11-06 NOTE — Assessment & Plan Note (Signed)
Doubt contributing at this time. Will check renal function for AKI given poor po.

## 2014-11-07 ENCOUNTER — Encounter: Payer: Self-pay | Admitting: Family Medicine

## 2014-11-21 ENCOUNTER — Other Ambulatory Visit: Payer: Self-pay | Admitting: Family Medicine

## 2014-11-21 NOTE — Telephone Encounter (Signed)
Refill request from pharmacy. Will forward to PCP for review. Nishanth Mccaughan. CMA.

## 2014-12-10 ENCOUNTER — Other Ambulatory Visit: Payer: Self-pay | Admitting: Neurological Surgery

## 2014-12-10 DIAGNOSIS — M545 Low back pain: Secondary | ICD-10-CM

## 2014-12-12 ENCOUNTER — Other Ambulatory Visit: Payer: Medicare Other

## 2014-12-12 ENCOUNTER — Ambulatory Visit
Admission: RE | Admit: 2014-12-12 | Discharge: 2014-12-12 | Disposition: A | Discharge status: Home / Self Care | Payer: Medicare Other | Source: Ambulatory Visit | Attending: Neurological Surgery | Admitting: Neurological Surgery

## 2014-12-12 DIAGNOSIS — M545 Low back pain: Secondary | ICD-10-CM

## 2014-12-14 ENCOUNTER — Other Ambulatory Visit: Payer: Self-pay | Admitting: Family Medicine

## 2014-12-23 ENCOUNTER — Ambulatory Visit (INDEPENDENT_AMBULATORY_CARE_PROVIDER_SITE_OTHER): Payer: Medicare Other | Admitting: Family Medicine

## 2014-12-23 ENCOUNTER — Encounter: Payer: Self-pay | Admitting: Family Medicine

## 2014-12-23 VITALS — BP 146/76 | HR 65 | Temp 98.6°F | Ht 62.0 in | Wt 221.2 lb

## 2014-12-23 DIAGNOSIS — R0602 Shortness of breath: Secondary | ICD-10-CM | POA: Diagnosis not present

## 2014-12-23 DIAGNOSIS — M2550 Pain in unspecified joint: Secondary | ICD-10-CM | POA: Insufficient documentation

## 2014-12-23 MED ORDER — ALBUTEROL SULFATE HFA 108 (90 BASE) MCG/ACT IN AERS: 2.0000 | INHALATION_SPRAY | Freq: Four times a day (QID) | RESPIRATORY_TRACT | Status: DC | PRN

## 2014-12-23 MED ORDER — NAPROXEN 500 MG PO TABS: 500.0000 mg | ORAL_TABLET | Freq: Two times a day (BID) | ORAL | Status: DC

## 2014-12-23 NOTE — Assessment & Plan Note (Signed)
May be related to her OA ( although she also has small joint involvement) vs Rheumatologic/autoimmmune condition. Naproxen prescribed prn pain. Rest at home. Return soon if no improvement then we will consider screening for autoimmune condition. Return precaution discussed. She verbalized understanding.

## 2014-12-23 NOTE — Patient Instructions (Signed)
Joint Pain  Joint pain can be caused by many things. The joint can be bruised, infected, weak from aging, or sore from exercise. The pain will probably go away if you follow your doctor's instructions for home care. If your joint pain continues, more tests may be needed to help find the cause of your condition.  HOME CARE  Watch your condition for any changes. Follow these instructions as told to lessen the pain that you are feeling:  · Take medicines only as told by your doctor.  · Rest the sore joint for as long as told by your doctor. If your doctor tells you to, raise (elevate) the painful joint above the level of your heart while you are sitting or lying down.  · Do not do things that cause pain or make the pain worse.  · If told, put ice on the painful area:    Put ice in a plastic bag.    Place a towel between your skin and the bag.    Leave the ice on for 20 minutes, 2-3 times per day.  · Wear an elastic bandage, splint, or sling as told by your doctor. Loosen the bandage or splint if your fingers or toes lose feeling (become numb) and tingle, or if they turn cold and blue.  · Begin exercising or stretching the joint as told by your doctor. Ask your doctor what types of exercise are safe for you.  · Keep all follow-up visits as told by your doctor. This is important.  GET HELP IF:  · Your pain gets worse and medicine does not help it.  · Your joint pain does not get better in 3 days.  · You have more bruising or swelling.  · You have a fever.  · You lose 10 pounds (4.5 kg) or more without trying.  GET HELP RIGHT AWAY IF:  · You are not able to move the joint.  · Your fingers or toes become numb or they turn cold and blue.     This information is not intended to replace advice given to you by your health care provider. Make sure you discuss any questions you have with your health care provider.     Document Released: 02/16/2009 Document Revised: 03/21/2014 Document Reviewed: 12/10/2013  Elsevier Interactive  Patient Education ©2016 [employer]

## 2014-12-23 NOTE — Assessment & Plan Note (Signed)
Currently asymptomatic. I refilled her Albuterol. Return soon to see PCP if having any concern.

## 2014-12-23 NOTE — Progress Notes (Signed)
Subjective:     Patient ID: Theresa [name], female   DOB: 08/03/1965, 49 y.o.   MRN: [id number]  HPI Joint pain:C/O joint pain since  2 wks ago, she has pain in the neck, in her wrists, her fingers,toes, knees and ankles. Pain is about 8/10 in severity, intensively aching in nature. She denies any trigger, no recent fall. She uses Tizanidinen prescribed by her spine specialist as well as tylenol with no improvement. No fever. She does not work due to vision impairement. She denies similar episodes of joint pain like this apart from the problem she has with her low back pain.  She has appointment on Oct 24th with her spine specialist, she had back fusion surgery a year ago. She takes Gabapentin for nerve pain. SOB: She is uncertain about diagnosis of Asthma or COPD, but since she had lung surgery after her diagnosis of lung cancer, she had needed to be on albuterol as needed for SOB. She need refill of her medication now.  Current Outpatient Prescriptions on File Prior to Visit  Medication Sig Dispense Refill  . amLODipine (NORVASC) 10 MG tablet Take 1 tablet (10 mg total) by mouth every morning. 90 tablet 3  . atorvastatin (LIPITOR) 40 MG tablet Take 1 tablet (40 mg total) by mouth daily. 90 tablet 3  . brimonidine (ALPHAGAN) 0.15 % ophthalmic solution Place 1 drop into both eyes 2 (two) times daily.    . carvedilol (COREG) 25 MG tablet TAKE 1 TABLET BY MOUTH TWICE DAILY WITH A MEAL 180 tablet 3  . clobetasol cream (TEMOVATE) 4.33 % Apply 1 application topically daily. 45 g 1  . FLUoxetine (PROZAC) 20 MG tablet TAKE 1 TABLET (20 MG TOTAL) BY MOUTH DAILY. 90 tablet 1  . fluticasone (FLONASE) 50 MCG/ACT nasal spray Place 2 sprays into both nostrils daily. (Patient taking differently: Place 2 sprays into both nostrils 2 (two) times daily as needed for allergies. ) 16 g 6  . gabapentin (NEURONTIN) 300 MG capsule Take 1 capsule (300 mg total) by mouth 3 (three) times daily. 270 capsule 3  . Insulin  Glargine (LANTUS SOLOSTAR) 100 UNIT/ML Solostar Pen Inject 10 units every morning (Patient taking differently: 15 Units. Inject 10 units every morning) 15 mL 3  . Insulin Pen Needle (BD ULTRA-FINE PEN NEEDLES) 29G X 12.7MM MISC Use to inject insulin daily 100 each 1  . ipratropium (ATROVENT) 0.06 % nasal spray Place 2 sprays into both nostrils daily. 15 mL 12  . lidocaine (XYLOCAINE) 5 % ointment Apply 1 application topically daily as needed. 35.44 g 1  . Linaclotide (LINZESS) 290 MCG CAPS capsule Take 1 capsule (290 mcg total) by mouth daily. 30 capsule 1  . omeprazole (PRILOSEC) 40 MG capsule Take 1 tablet twice daily before a meal. 60 capsule 3  . PRESCRIPTION MEDICATION Apply 1 drop to eye at bedtime.    . ranitidine (ZANTAC) 150 MG tablet TAKE 1 TABLET BY MOUTH TWICE A DAY 60 tablet 3  . sucralfate (CARAFATE) 1 GM/10ML suspension Take 1 gram twice daily before breakfast and dinner. 420 mL 2  . valsartan (DIOVAN) 160 MG tablet Take 1 tablet (160 mg total) by mouth daily. 30 tablet 3  . varenicline (CHANTIX) 1 MG tablet Take 1 tablet (1 mg total) by mouth 2 (two) times daily. 60 tablet 1   No current facility-administered medications on file prior to visit.   Past Medical History  Diagnosis Date  . Hypertension   . Hyperlipidemia   .  Blindness, legal     glaucoma and retinitis  pigmentosa  . Glaucoma   . Onychomycosis   . Retinitis pigmentosa   . Chronic eczema   . Anxiety   . Hx of radiation therapy 01-17-11 to 03-18-11    lung  . History of chemotherapy 01/2011 to 03/2011    concurrent w/radiation therapy  . Chronic headaches 10-17-11    migraines  . Sleep apnea     cpap 5 yrs  . Asthma   . GERD (gastroesophageal reflux disease)   . Arthritis   . Family history of malignant hyperthermia     1 son 16 years ago  . Shortness of breath     with exertion  . Depression   . Diabetes mellitus type II     diet   . Constipation   . Fibromyalgia     denies, has not been diagnosited   . Lung cancer ([place])     lung ca dx 2010  . Family history of anesthesia complication     1 son had malignant hyperthermia at 23yr  tonsils rained     Review of Systems  Eyes: Positive for visual disturbance.  Respiratory: Positive for shortness of breath. Negative for wheezing.        Currently no SOB  Cardiovascular: Negative.  Negative for leg swelling.  Gastrointestinal: Negative.   Musculoskeletal: Positive for back pain, arthralgias and neck pain. Negative for joint swelling and neck stiffness.  All other systems reviewed and are negative.      Filed Vitals:   12/23/14 1117  BP: 146/76  Pulse: 65  Temp: 98.6 F (37 C)  TempSrc: Oral  Height: '5\' 2"'$  (1.575 m)  Weight: 221 lb 4 oz (100.358 kg)    Objective:   Physical Exam  Constitutional: She appears well-developed. No distress.  Cardiovascular: Normal rate, regular rhythm, normal heart sounds and intact distal pulses.   No murmur heard. Pulmonary/Chest: Effort normal and breath sounds normal. No respiratory distress. She has no wheezes.  Abdominal: Soft. Bowel sounds are normal. She exhibits no distension and no mass. There is no tenderness.  Musculoskeletal:       Right shoulder: She exhibits normal range of motion.       Left shoulder: She exhibits normal range of motion and no tenderness.       Right elbow: Normal.      Left elbow: Normal.       Right wrist: She exhibits tenderness. She exhibits normal range of motion.       Left wrist: She exhibits tenderness. She exhibits normal range of motion.       Right knee: Normal.       Left knee: Normal.       Right ankle: Normal.       Left ankle: Normal.       Cervical back: She exhibits tenderness. She exhibits normal range of motion, no swelling, no deformity and no spasm.       Lumbar back: She exhibits tenderness.  Nursing note and vitals reviewed.      Assessment:     Joint pain Shortness of breath     Plan:     Check problem list.

## 2015-01-09 ENCOUNTER — Telehealth: Payer: Self-pay | Admitting: Internal Medicine

## 2015-01-09 NOTE — Telephone Encounter (Signed)
pt called to confirm appts...pt ok and aware °

## 2015-01-10 ENCOUNTER — Other Ambulatory Visit: Payer: Self-pay | Admitting: Family Medicine

## 2015-02-06 ENCOUNTER — Ambulatory Visit (HOSPITAL_COMMUNITY)
Admission: RE | Admit: 2015-02-06 | Discharge: 2015-02-06 | Disposition: A | Discharge status: Home / Self Care | Payer: Medicare Other | Source: Ambulatory Visit | Attending: Internal Medicine | Admitting: Internal Medicine

## 2015-02-06 ENCOUNTER — Encounter (HOSPITAL_COMMUNITY): Payer: Self-pay

## 2015-02-06 ENCOUNTER — Other Ambulatory Visit: Payer: Self-pay | Admitting: Medical Oncology

## 2015-02-06 ENCOUNTER — Other Ambulatory Visit (HOSPITAL_BASED_OUTPATIENT_CLINIC_OR_DEPARTMENT_OTHER): Payer: Medicare Other

## 2015-02-06 DIAGNOSIS — C3492 Malignant neoplasm of unspecified part of left bronchus or lung: Secondary | ICD-10-CM

## 2015-02-06 LAB — CBC WITH DIFFERENTIAL/PLATELET
BASO%: 0.7 % (ref 0.0–2.0)
Basophils Absolute: 0.1 10*3/uL (ref 0.0–0.1)
EOS%: 3.9 % (ref 0.0–7.0)
Eosinophils Absolute: 0.3 10*3/uL (ref 0.0–0.5)
HCT: 51.8 % — ABNORMAL HIGH (ref 34.8–46.6)
HGB: 17 g/dL — ABNORMAL HIGH (ref 11.6–15.9)
LYMPH%: 24.2 % (ref 14.0–49.7)
MCH: 29.7 pg (ref 25.1–34.0)
MCHC: 32.9 g/dL (ref 31.5–36.0)
MCV: 90.3 fL (ref 79.5–101.0)
MONO#: 0.8 10*3/uL (ref 0.1–0.9)
MONO%: 10 % (ref 0.0–14.0)
NEUT#: 4.6 10*3/uL (ref 1.5–6.5)
NEUT%: 61.2 % (ref 38.4–76.8)
Platelets: 205 10*3/uL (ref 145–400)
RBC: 5.73 10*6/uL — ABNORMAL HIGH (ref 3.70–5.45)
RDW: 13.9 % (ref 11.2–14.5)
WBC: 7.6 10*3/uL (ref 3.9–10.3)
lymph#: 1.8 10*3/uL (ref 0.9–3.3)

## 2015-02-06 LAB — COMPREHENSIVE METABOLIC PANEL (CC13)
ALT: 16 U/L (ref 0–55)
AST: 12 U/L (ref 5–34)
Albumin: 3.9 g/dL (ref 3.5–5.0)
Alkaline Phosphatase: 71 U/L (ref 40–150)
Anion Gap: 9 mEq/L (ref 3–11)
BUN: 10.8 mg/dL (ref 7.0–26.0)
CO2: 28 mEq/L (ref 22–29)
Calcium: 9.6 mg/dL (ref 8.4–10.4)
Chloride: 102 mEq/L (ref 98–109)
Creatinine: 1.5 mg/dL — ABNORMAL HIGH (ref 0.6–1.1)
EGFR: 46 mL/min/{1.73_m2} — ABNORMAL LOW (ref >90)
Glucose: 224 mg/dl — ABNORMAL HIGH (ref 70–140)
Potassium: 3.9 mEq/L (ref 3.5–5.1)
Sodium: 139 mEq/L (ref 136–145)
Total Bilirubin: 0.87 mg/dL (ref 0.20–1.20)
Total Protein: 6.9 g/dL (ref 6.4–8.3)

## 2015-02-06 MED ORDER — IOHEXOL 300 MG/ML  SOLN
75.0000 mL | Freq: Once | INTRAMUSCULAR | Status: AC | PRN
  Administered 2015-02-06: 75 mL via INTRAVENOUS

## 2015-02-12 ENCOUNTER — Ambulatory Visit: Payer: Medicare Other | Admitting: Internal Medicine

## 2015-02-19 ENCOUNTER — Telehealth: Payer: Self-pay | Admitting: Internal Medicine

## 2015-02-19 NOTE — Telephone Encounter (Signed)
pt called to r/s missed appt.Marland KitchenMarland KitchenMarland KitchenMarland Kitchenpt ok and aware

## 2015-02-23 ENCOUNTER — Emergency Department (HOSPITAL_COMMUNITY): Payer: Medicare Other

## 2015-02-23 ENCOUNTER — Emergency Department (HOSPITAL_COMMUNITY)
Admission: EM | Admit: 2015-02-23 | Discharge: 2015-02-23 | Disposition: A | Discharge status: Home / Self Care | Payer: Medicare Other | Attending: Emergency Medicine | Admitting: Emergency Medicine

## 2015-02-23 ENCOUNTER — Encounter (HOSPITAL_COMMUNITY): Payer: Self-pay

## 2015-02-23 DIAGNOSIS — Z9981 Dependence on supplemental oxygen: Secondary | ICD-10-CM | POA: Diagnosis not present

## 2015-02-23 DIAGNOSIS — E785 Hyperlipidemia, unspecified: Secondary | ICD-10-CM | POA: Insufficient documentation

## 2015-02-23 DIAGNOSIS — K219 Gastro-esophageal reflux disease without esophagitis: Secondary | ICD-10-CM | POA: Insufficient documentation

## 2015-02-23 DIAGNOSIS — Z880 Allergy status to penicillin: Secondary | ICD-10-CM | POA: Insufficient documentation

## 2015-02-23 DIAGNOSIS — G4730 Sleep apnea, unspecified: Secondary | ICD-10-CM | POA: Diagnosis not present

## 2015-02-23 DIAGNOSIS — H548 Legal blindness, as defined in USA: Secondary | ICD-10-CM | POA: Insufficient documentation

## 2015-02-23 DIAGNOSIS — M797 Fibromyalgia: Secondary | ICD-10-CM | POA: Diagnosis not present

## 2015-02-23 DIAGNOSIS — E119 Type 2 diabetes mellitus without complications: Secondary | ICD-10-CM | POA: Insufficient documentation

## 2015-02-23 DIAGNOSIS — F17210 Nicotine dependence, cigarettes, uncomplicated: Secondary | ICD-10-CM | POA: Diagnosis not present

## 2015-02-23 DIAGNOSIS — F419 Anxiety disorder, unspecified: Secondary | ICD-10-CM | POA: Insufficient documentation

## 2015-02-23 DIAGNOSIS — Z85118 Personal history of other malignant neoplasm of bronchus and lung: Secondary | ICD-10-CM | POA: Insufficient documentation

## 2015-02-23 DIAGNOSIS — J45901 Unspecified asthma with (acute) exacerbation: Secondary | ICD-10-CM | POA: Diagnosis not present

## 2015-02-23 DIAGNOSIS — R05 Cough: Secondary | ICD-10-CM | POA: Diagnosis present

## 2015-02-23 DIAGNOSIS — Z8619 Personal history of other infectious and parasitic diseases: Secondary | ICD-10-CM | POA: Insufficient documentation

## 2015-02-23 DIAGNOSIS — I10 Essential (primary) hypertension: Secondary | ICD-10-CM | POA: Insufficient documentation

## 2015-02-23 DIAGNOSIS — Z872 Personal history of diseases of the skin and subcutaneous tissue: Secondary | ICD-10-CM | POA: Diagnosis not present

## 2015-02-23 DIAGNOSIS — Z7952 Long term (current) use of systemic steroids: Secondary | ICD-10-CM | POA: Insufficient documentation

## 2015-02-23 DIAGNOSIS — G8929 Other chronic pain: Secondary | ICD-10-CM | POA: Insufficient documentation

## 2015-02-23 DIAGNOSIS — J40 Bronchitis, not specified as acute or chronic: Secondary | ICD-10-CM

## 2015-02-23 DIAGNOSIS — Z794 Long term (current) use of insulin: Secondary | ICD-10-CM | POA: Diagnosis not present

## 2015-02-23 DIAGNOSIS — Z79899 Other long term (current) drug therapy: Secondary | ICD-10-CM | POA: Insufficient documentation

## 2015-02-23 LAB — CBC WITH DIFFERENTIAL/PLATELET
Basophils Absolute: 0 10*3/uL (ref 0.0–0.1)
Basophils Relative: 0 %
Eosinophils Absolute: 0.2 10*3/uL (ref 0.0–0.7)
Eosinophils Relative: 3 %
HCT: 51.6 % — ABNORMAL HIGH (ref 36.0–46.0)
Hemoglobin: 17.5 g/dL — ABNORMAL HIGH (ref 12.0–15.0)
Lymphocytes Relative: 20 %
Lymphs Abs: 1.6 10*3/uL (ref 0.7–4.0)
MCH: 30.4 pg (ref 26.0–34.0)
MCHC: 33.9 g/dL (ref 30.0–36.0)
MCV: 89.7 fL (ref 78.0–100.0)
Monocytes Absolute: 0.5 10*3/uL (ref 0.1–1.0)
Monocytes Relative: 7 %
Neutro Abs: 5.7 10*3/uL (ref 1.7–7.7)
Neutrophils Relative %: 70 %
Platelets: 187 10*3/uL (ref 150–400)
RBC: 5.75 MIL/uL — ABNORMAL HIGH (ref 3.87–5.11)
RDW: 13 % (ref 11.5–15.5)
WBC: 8.1 10*3/uL (ref 4.0–10.5)

## 2015-02-23 LAB — COMPREHENSIVE METABOLIC PANEL
ALT: 17 U/L (ref 14–54)
AST: 16 U/L (ref 15–41)
Albumin: 4.4 g/dL (ref 3.5–5.0)
Alkaline Phosphatase: 60 U/L (ref 38–126)
Anion gap: 9 (ref 5–15)
BUN: 13 mg/dL (ref 6–20)
CO2: 25 mmol/L (ref 22–32)
Calcium: 9.1 mg/dL (ref 8.9–10.3)
Chloride: 103 mmol/L (ref 101–111)
Creatinine, Ser: 1.19 mg/dL — ABNORMAL HIGH (ref 0.44–1.00)
GFR calc Af Amer: >60 mL/min (ref >60)
GFR calc non Af Amer: 52 mL/min — ABNORMAL LOW (ref >60)
Glucose, Bld: 208 mg/dL — ABNORMAL HIGH (ref 65–99)
Potassium: 3.7 mmol/L (ref 3.5–5.1)
Sodium: 137 mmol/L (ref 135–145)
Total Bilirubin: 1.5 mg/dL — ABNORMAL HIGH (ref 0.3–1.2)
Total Protein: 7.4 g/dL (ref 6.5–8.1)

## 2015-02-23 LAB — I-STAT TROPONIN, ED: Troponin i, poc: 0 ng/mL (ref 0.00–0.08)

## 2015-02-23 MED ORDER — GUAIFENESIN-DM 100-10 MG/5ML PO SYRP
5.0000 mL | ORAL_SOLUTION | Freq: Once | ORAL | Status: AC
  Administered 2015-02-23: 5 mL via ORAL
  Filled 2015-02-23: qty 10

## 2015-02-23 MED ORDER — ALBUTEROL SULFATE (2.5 MG/3ML) 0.083% IN NEBU
5.0000 mg | INHALATION_SOLUTION | Freq: Once | RESPIRATORY_TRACT | Status: AC
Start: 2015-02-23 — End: 2015-02-23
  Administered 2015-02-23: 5 mg via RESPIRATORY_TRACT
  Filled 2015-02-23: qty 6

## 2015-02-23 MED ORDER — ALBUTEROL SULFATE HFA 108 (90 BASE) MCG/ACT IN AERS: 2.0000 | INHALATION_SPRAY | RESPIRATORY_TRACT | Status: DC | PRN

## 2015-02-23 MED ORDER — GUAIFENESIN-DM 100-10 MG/5ML PO SYRP: 5.0000 mL | ORAL_SOLUTION | ORAL | Status: DC | PRN

## 2015-02-23 MED ORDER — IPRATROPIUM-ALBUTEROL 0.5-2.5 (3) MG/3ML IN SOLN
3.0000 mL | Freq: Once | RESPIRATORY_TRACT | Status: AC
  Administered 2015-02-23: 3 mL via RESPIRATORY_TRACT
  Filled 2015-02-23: qty 3

## 2015-02-23 NOTE — ED Notes (Signed)
Went to room to discharge patient-no one in room.  EKG leads noted to be disconnected and laying in the bed.  Patient left prior to discharge paperwork and prescriptions as well as vitals.

## 2015-02-23 NOTE — ED Provider Notes (Signed)
CSN: [id number]     Arrival date & time 02/23/15  1339 History   First MD Initiated Contact with Patient 02/23/15 1647     Chief Complaint  Patient presents with  . Shortness of Breath     (Consider location/radiation/quality/duration/timing/severity/associated sxs/prior Treatment) HPI 49 year old female who presents with cough and shortness of breath. History of hypertension, DM-2 hyperlipidemia, and remote history of lung cancer, in remission. Also recorded history of asthma, although she states that she does not have underlying asthma/COPD. She has had 4 days of progressively worsening cough productive of sputum, shortness of breath, and chest tightness. Initially symptoms started off with congestion, sore throat, and runny nose. HER two sons has also been sick recently with same symptoms prior to onset of her symptoms. No fevers, chills, leg swelling, leg pain, lightheadedness or syncope. No nausea, vomiting, diarrhea, or abdominal pain.   Past Medical History  Diagnosis Date  . Hypertension   . Hyperlipidemia   . Blindness, legal     glaucoma and retinitis  pigmentosa  . Glaucoma   . Onychomycosis   . Retinitis pigmentosa   . Chronic eczema   . Anxiety   . Hx of radiation therapy 01-17-11 to 03-18-11    lung  . History of chemotherapy 01/2011 to 03/2011    concurrent w/radiation therapy  . Chronic headaches 10-17-11    migraines  . Sleep apnea     cpap 5 yrs  . Asthma   . GERD (gastroesophageal reflux disease)   . Arthritis   . Family history of malignant hyperthermia     1 son 16 years ago  . Shortness of breath     with exertion  . Depression   . Diabetes mellitus type II     diet   . Constipation   . Fibromyalgia     denies, has not been diagnosited  . Lung cancer ([place])     lung ca dx 2010  . Family history of anesthesia complication     1 son had malignant hyperthermia at 35yr  tonsils rained   Past Surgical History  Procedure Laterality Date  . Lung surgery   03/24/08    L vats, L thoracotomy and LUL trisegmentectomy with node dissection  . Chest tube insertion   04/01/08    L hydropneumothorax  . Portacath placement  02/11/2011    Procedure: INSERTION PORT-A-CATH;  Surgeon: DPierre Bali MD;  [facility]  Service: Thoracic;  Laterality: Right;  9.6Fr. Pre-attached Power Port in Right Internal Jugular  -right chest-remains inplace 10-17-11  . Knee arthroscopy  10-17-11    bil. knee scope(left was torn ligament)  . Lumbar laminectomy/decompression microdiscectomy  10/27/2011    Procedure: LUMBAR LAMINECTOMY/DECOMPRESSION MICRODISCECTOMY;  Surgeon: JJohnn Hai MD;  Location: [facility];  Service: Orthopedics;  Laterality: N/A;  L4-L5  . Abdominal hysterectomy  09/19/2001    hx. of fibroids. TAH. Ovaries remain.   . Eye surgery Bilateral     lens implant  . Maximum access (mas)posterior lumbar interbody fusion (plif) 1 level N/A 07/03/2013    Procedure: FOR MAXIMUM ACCESS (MAS) POSTERIOR LUMBAR INTERBODY FUSION (PLIF) 1 LEVEL;  Surgeon: DEustace Moore MD;  [facility];  Service: Neurosurgery;  Laterality: N/A;  FOR MAXIMUM ACCESS (MAS) POSTERIOR LUMBAR INTERBODY FUSION (PLIF) 1 LEVEL (L4-L5)  . Back surgery  06/2013  . Port-a-cath removal Right 11/26/2013    Procedure: REMOVAL PORT-A-CATH;  Surgeon: BGaye Pollack MD;  [place] [facility]  Service: Thoracic;  Laterality: Right;   Family History  Problem Relation Age of Onset  . Brain cancer Maternal Aunt   . Depression Mother   . Heart disease Mother   . Depression Son   . Diabetes Maternal Grandmother   . Stroke Maternal Grandmother   . Depression Maternal Grandmother   . Depression Daughter   . Malignant hyperthermia Son 16    tonsils drained   Social History  Substance Use Topics  . Smoking status: Current Some Day Smoker -- 0.50 packs/day for 36 years    Types: Cigarettes    Start date: 03/14/1978  . Smokeless tobacco: Never Used     Comment: starting chantix, hx 1 1/2  PPD(has decrease to 2 cigs per day)  . Alcohol Use: 0.0 oz/week    0 Standard drinks or equivalent per week     Comment: occasional   OB History    No data available     Review of Systems 10/14 systems reviewed and are negative other than those stated in the HPI    Allergies  Codeine; Morphine and related; Penicillins; Metformin and related; and Tomato  Home Medications   Prior to Admission medications   Medication Sig Start Date End Date Taking? Authorizing Provider  albuterol (PROVENTIL HFA;VENTOLIN HFA) 108 (90 BASE) MCG/ACT inhaler Inhale 2 puffs into the lungs every 6 (six) hours as needed for wheezing or shortness of breath. 12/23/14  Yes Kinnie Feil, MD  amLODipine (NORVASC) 10 MG tablet Take 1 tablet (10 mg total) by mouth every morning. 07/31/14  Yes Zenia Resides, MD  atorvastatin (LIPITOR) 40 MG tablet Take 1 tablet (40 mg total) by mouth daily. 07/31/14  Yes Zenia Resides, MD  brimonidine (ALPHAGAN) 0.15 % ophthalmic solution Place 1 drop into both eyes 2 (two) times daily.   Yes Historical Provider, MD  carvedilol (COREG) 25 MG tablet TAKE 1 TABLET BY MOUTH TWICE DAILY WITH A MEAL 07/15/14  Yes Leone Brand, MD  clobetasol cream (TEMOVATE) 0.17 % Apply 1 application topically daily. 05/12/14  Yes Leone Brand, MD  fluticasone [facility]) 50 MCG/ACT nasal spray Place 2 sprays into both nostrils daily. Patient taking differently: Place 2 sprays into both nostrils 2 (two) times daily as needed for allergies.    Yes Waldemar Dickens, MD  gabapentin (NEURONTIN) 300 MG capsule Take 1 capsule (300 mg total) by mouth 3 (three) times daily. 07/31/14  Yes Zenia Resides, MD  Insulin Glargine (LANTUS SOLOSTAR) 100 UNIT/ML Solostar Pen Inject 10 units every morning Patient taking differently: Inject 15 Units into the skin every morning.  06/24/14  Yes Leone Brand, MD  Insulin Pen Needle (BD ULTRA-FINE PEN NEEDLES) 29G X 12.7MM MISC Use to inject insulin daily 06/24/14  Yes  Leone Brand, MD  Linaclotide [facility]) 290 MCG CAPS capsule Take 1 capsule (290 mcg total) by mouth daily. 11/06/14  Yes Patrecia Pour, MD  omeprazole (PRILOSEC) 40 MG capsule Take 1 tablet twice daily before a meal. 07/31/14  Yes Zenia Resides, MD  PRESCRIPTION MEDICATION Apply 1 drop to eye at bedtime.   Yes Historical Provider, MD  tiZANidine (ZANAFLEX) 4 MG capsule Take 4 mg by mouth 3 (three) times daily. 01/05/15  Yes Historical Provider, MD  varenicline (CHANTIX) 1 MG tablet Take 1 tablet (1 mg total) by mouth 2 (two) times daily. 07/03/14  Yes Zenia Resides, MD  albuterol (PROVENTIL HFA;VENTOLIN HFA) 108 (90 BASE) MCG/ACT inhaler Inhale 2  puffs into the lungs every 4 (four) hours as needed for wheezing or shortness of breath. 02/23/15   Forde Dandy, MD  FLUoxetine (PROZAC) 20 MG tablet TAKE 1 TABLET (20 MG TOTAL) BY MOUTH DAILY. Patient not taking: Reported on 02/23/2015 12/15/14   Leone Brand, MD  guaiFENesin-dextromethorphan [facility] DM) 100-10 MG/5ML syrup Take 5 mLs by mouth every 4 (four) hours as needed for cough. 02/23/15   Forde Dandy, MD  ipratropium (ATROVENT) 0.06 % nasal spray Place 2 sprays into both nostrils daily. Patient not taking: Reported on 02/23/2015 07/31/14   Zenia Resides, MD  lidocaine (XYLOCAINE) 5 % ointment Apply 1 application topically daily as needed. Patient not taking: Reported on 02/23/2015 06/24/14   Leone Brand, MD  naproxen (NAPROSYN) 500 MG tablet Take 1 tablet (500 mg total) by mouth 2 (two) times daily with a meal. Patient not taking: Reported on 02/23/2015 12/23/14   Kinnie Feil, MD  ranitidine (ZANTAC) 150 MG tablet TAKE 1 TABLET BY MOUTH TWICE A DAY Patient not taking: Reported on 02/23/2015 11/21/14   Leone Brand, MD  sucralfate (CARAFATE) 1 GM/10ML suspension Take 1 gram twice daily before breakfast and dinner. Patient not taking: Reported on 02/23/2015 07/31/14   Zenia Resides, MD  valsartan (DIOVAN) 160 MG tablet TAKE 1  TABLET (160 MG TOTAL) BY MOUTH DAILY. Patient not taking: Reported on 02/23/2015 01/12/15   Leone Brand, MD   BP 121/62 mmHg  Pulse 64  Temp(Src) 98.2 F (36.8 C)  Resp 16  SpO2 99% Physical Exam Physical Exam  Nursing note and vitals reviewed. Constitutional: Well developed, well nourished, non-toxic, and in no acute distress Head: Normocephalic and atraumatic.  Mouth/Throat: Oropharynx is clear and moist.  Neck: Normal range of motion. Neck supple.  Cardiovascular: Normal rate and regular rhythm.  No edema. Pulmonary/Chest: Effort normal. Coarse breath sounds with bronchospastic cough. No conversational dyspnea. Abdominal: Soft. There is no tenderness. There is no rebound and no guarding.  Musculoskeletal: Normal range of motion.  Neurological: Alert, no facial droop, fluent speech, moves all extremities symmetrically Skin: Skin is warm and dry.  Psychiatric: Cooperative  ED Course  Procedures (including critical care time) Labs Review Labs Reviewed  CBC WITH DIFFERENTIAL/PLATELET - Abnormal; Notable for the following:    RBC 5.75 (*)    Hemoglobin 17.5 (*)    HCT 51.6 (*)    All other components within normal limits  COMPREHENSIVE METABOLIC PANEL - Abnormal; Notable for the following:    Glucose, Bld 208 (*)    Creatinine, Ser 1.19 (*)    Total Bilirubin 1.5 (*)    GFR calc non Af Amer 52 (*)    All other components within normal limits  Randolm Idol, ED    Imaging Review Dg Chest 2 View  02/23/2015  CLINICAL DATA:  49 year old female with shortness breath and cough for the past 4 days. Dizziness. EXAM: CHEST  2 VIEW COMPARISON:  Chest x-ray 11/26/2013. FINDINGS: Postoperative changes of left upper lobe wedge resection and chronic postradiation changes in the apex of the left hemithorax are again noted, with evidence of chronic left-sided volume loss, including mild elevation of the left hemidiaphragm (all of which is very similar to the prior study). No acute  consolidative airspace disease. No pleural effusions. No evidence of pulmonary edema. Heart size is normal. Upper mediastinal contours are grossly distorted by postoperative and post treatment related changes, as above. IMPRESSION: 1. No radiographic evidence of acute  cardiopulmonary disease. The radiographic appearance of the chest is very similar to prior studies, as above. Electronically Signed   By: Vinnie Langton M.D.   On: 02/23/2015 18:21   I have personally reviewed and evaluated these images and lab results as part of my medical decision-making.   EKG Interpretation   Date/Time:  Monday February 23 2015 14:35:07 EST Ventricular Rate:  71 PR Interval:  171 QRS Duration: 88 QT Interval:  333 QTC Calculation: 362 R Axis:   84 Text Interpretation:  Sinus rhythm Borderline T abnormalities, inferior  leads Baseline wander in lead(s) I III aVR aVL V5 No change from prior  EKGs Confirmed by Yaret Hush MD, Hinton Dyer ([id number]) on 02/23/2015 4:57:02 PM      MDM   Final diagnoses:  Bronchitis    In short this is a 49 year old female with history of hypertension, hyperlipidemia, and diabetes who presents with 4 days of cough, congestion, shortness of breath. He is nontoxic and in no acute distress. Vital signs are non-concerning. Is noted to have bronchospastic cough on lung exam. She has normal work of breathing and no conversational dyspnea. Overall clinical presentation seems likely consistent with viral respiratory illness. Chest x-ray shows no acute cardiopulmonary processes. Basic blood work is overall unremarkable. EKG is nonischemic and has a negative troponin. I do not suspect that her chest tightness is due to underlying ACS and more consistent with her respiratory illness. Eyes given a breathing treatment here with good response. I discussed supportive care instructions for home. Is given prescriptions for an inhaler, anti-tussive, and steroids.     Forde Dandy, MD 02/24/15 [phone]

## 2015-02-23 NOTE — ED Notes (Signed)
Shortness of breath x 4 days.  Cough/congestion.  Hx lung cancer.  Productive cough.  No fever.

## 2015-02-23 NOTE — Discharge Instructions (Signed)
Return without fail for worsening symptoms including worsening pain, difficulty breathing, or any other symptoms concerning to you. Please follow-up with your primary care doctor in 3 days as scheduled.

## 2015-02-26 ENCOUNTER — Encounter: Payer: Self-pay | Admitting: Internal Medicine

## 2015-02-26 ENCOUNTER — Ambulatory Visit (HOSPITAL_BASED_OUTPATIENT_CLINIC_OR_DEPARTMENT_OTHER): Payer: Medicare Other | Admitting: Internal Medicine

## 2015-02-26 ENCOUNTER — Telehealth: Payer: Self-pay | Admitting: Internal Medicine

## 2015-02-26 VITALS — BP 139/74 | HR 71 | Temp 98.9°F | Resp 24 | Ht 62.0 in | Wt 221.3 lb

## 2015-02-26 DIAGNOSIS — Z85118 Personal history of other malignant neoplasm of bronchus and lung: Secondary | ICD-10-CM

## 2015-02-26 DIAGNOSIS — C3492 Malignant neoplasm of unspecified part of left bronchus or lung: Secondary | ICD-10-CM

## 2015-02-26 NOTE — Progress Notes (Signed)
[facility] [phone]   Fax:([phone]) [place], MD [place] [address]  DIAGNOSIS: Recurrent non-small cell lung cancer initially diagnosed as stage IA (T1b, N0, MX) adenocarcinoma in November 2009.   PRIOR THERAPY:  #1 Status post left upper lobe trisegmentectomy with lymph node dissection under the care of Dr. Arlyce Dice on March 24, 2009.  #2 Concurrent chemoradiation with carboplatin for AUC of 2 and paclitaxel 45 mg/M2 given weekly with radiation.   CURRENT THERAPY: Observation.  CHEMOTHERAPY INTENT: Curative  CURRENT # OF CHEMOTHERAPY CYCLES: 0  CURRENT ANTIEMETICS: None  CURRENT SMOKING STATUS: Current smoker and I strongly encouraged her to quit smoking and altered her to smoke cessation program.  ORAL CHEMOTHERAPY AND CONSENT: None  CURRENT BISPHOSPHONATES USE: None  PAIN MANAGEMENT: 0/10  NARCOTICS INDUCED CONSTIPATION: None  LIVING WILL AND CODE STATUS: No CODE BLUE   INTERVAL HISTORY: Theresa [name] 49 y.o. female returns to the clinic today for six-month followup visit accompanied by her husband. The patient is feeling fine today with no specific complaints. She had recent acute bronchitis which is now resolving. She has no chest pain, shortness of breath, cough or hemoptysis. She denied having any significant fever or chills, no nausea or vomiting. No significant weight loss or night sweats. The patient denied having any fatigue or weakness. She had repeat CT scan of the chest performed recently and she is here for evaluation and discussion of her scan results.  MEDICAL HISTORY: Past Medical History  Diagnosis Date  . Hypertension   . Hyperlipidemia   . Blindness, legal     glaucoma and retinitis  pigmentosa  . Glaucoma   . Onychomycosis   . Retinitis pigmentosa   . Chronic eczema   . Anxiety   . Hx of radiation therapy 01-17-11 to 03-18-11    lung  . History of  chemotherapy 01/2011 to 03/2011    concurrent w/radiation therapy  . Chronic headaches 10-17-11    migraines  . Sleep apnea     cpap 5 yrs  . Asthma   . GERD (gastroesophageal reflux disease)   . Arthritis   . Family history of malignant hyperthermia     1 son 16 years ago  . Shortness of breath     with exertion  . Depression   . Diabetes mellitus type II     diet   . Constipation   . Fibromyalgia     denies, has not been diagnosited  . Lung cancer ([facility])     lung ca dx 2010  . Family history of anesthesia complication     1 son had malignant hyperthermia at 15yr  tonsils rained    ALLERGIES:  is allergic to codeine; morphine and related; penicillins; metformin and related; and tomato.  MEDICATIONS:  Current Outpatient Prescriptions  Medication Sig Dispense Refill  . albuterol (PROVENTIL HFA;VENTOLIN HFA) 108 (90 BASE) MCG/ACT inhaler Inhale 2 puffs into the lungs every 6 (six) hours as needed for wheezing or shortness of breath. 1 Inhaler 0  . albuterol (PROVENTIL HFA;VENTOLIN HFA) 108 (90 BASE) MCG/ACT inhaler Inhale 2 puffs into the lungs every 4 (four) hours as needed for wheezing or shortness of breath. 1 Inhaler 0  . amLODipine (NORVASC) 10 MG tablet Take 1 tablet (10 mg total) by mouth every morning. 90 tablet 3  . atorvastatin (LIPITOR) 40 MG tablet Take 1 tablet (40 mg total) by mouth  daily. 90 tablet 3  . brimonidine (ALPHAGAN) 0.15 % ophthalmic solution Place 1 drop into both eyes 2 (two) times daily.    . carvedilol (COREG) 25 MG tablet TAKE 1 TABLET BY MOUTH TWICE DAILY WITH A MEAL 180 tablet 3  . clobetasol cream (TEMOVATE) 6.29 % Apply 1 application topically daily. 45 g 1  . FLUoxetine (PROZAC) 20 MG tablet TAKE 1 TABLET (20 MG TOTAL) BY MOUTH DAILY. 90 tablet 1  . fluticasone (FLONASE) 50 MCG/ACT nasal spray Place 2 sprays into both nostrils daily. (Patient taking differently: Place 2 sprays into both nostrils 2 (two) times daily as needed for allergies. ) 16 g 6    . gabapentin (NEURONTIN) 300 MG capsule Take 1 capsule (300 mg total) by mouth 3 (three) times daily. 270 capsule 3  . Insulin Glargine (LANTUS SOLOSTAR) 100 UNIT/ML Solostar Pen Inject 10 units every morning (Patient taking differently: Inject 15 Units into the skin every morning. ) 15 mL 3  . Insulin Pen Needle (BD ULTRA-FINE PEN NEEDLES) 29G X 12.7MM MISC Use to inject insulin daily 100 each 1  . ipratropium (ATROVENT) 0.06 % nasal spray Place 2 sprays into both nostrils daily. 15 mL 12  . lidocaine (XYLOCAINE) 5 % ointment Apply 1 application topically daily as needed. 35.44 g 1  . Linaclotide (LINZESS) 290 MCG CAPS capsule Take 1 capsule (290 mcg total) by mouth daily. 30 capsule 1  . naproxen (NAPROSYN) 500 MG tablet Take 1 tablet (500 mg total) by mouth 2 (two) times daily with a meal. 30 tablet 0  . omeprazole (PRILOSEC) 40 MG capsule Take 1 tablet twice daily before a meal. 60 capsule 3  . PRESCRIPTION MEDICATION Apply 1 drop to eye at bedtime.    . ranitidine (ZANTAC) 150 MG tablet TAKE 1 TABLET BY MOUTH TWICE A DAY 60 tablet 3  . sucralfate (CARAFATE) 1 GM/10ML suspension Take 1 gram twice daily before breakfast and dinner. 420 mL 2  . tiZANidine (ZANAFLEX) 4 MG capsule Take 4 mg by mouth 3 (three) times daily.  0  . valsartan (DIOVAN) 160 MG tablet TAKE 1 TABLET (160 MG TOTAL) BY MOUTH DAILY. 90 tablet 3  . varenicline (CHANTIX) 1 MG tablet Take 1 tablet (1 mg total) by mouth 2 (two) times daily. 60 tablet 1  . guaiFENesin-dextromethorphan (ROBITUSSIN DM) 100-10 MG/5ML syrup Take 5 mLs by mouth every 4 (four) hours as needed for cough. 118 mL 0   No current facility-administered medications for this visit.    SURGICAL HISTORY:  Past Surgical History  Procedure Laterality Date  . Lung surgery  03/24/08    L vats, L thoracotomy and LUL trisegmentectomy with node dissection  . Chest tube insertion   04/01/08    L hydropneumothorax  . Portacath placement  02/11/2011    Procedure:  INSERTION PORT-A-CATH;  Surgeon: Pierre Bali, MD;  Location: [facility];  Service: Thoracic;  Laterality: Right;  9.6Fr. Pre-attached Power Port in Right Internal Jugular  -right chest-remains inplace 10-17-11  . Knee arthroscopy  10-17-11    bil. knee scope(left was torn ligament)  . Lumbar laminectomy/decompression microdiscectomy  10/27/2011    Procedure: LUMBAR LAMINECTOMY/DECOMPRESSION MICRODISCECTOMY;  Surgeon: Johnn Hai, MD;  Location: [facility];  Service: Orthopedics;  Laterality: N/A;  L4-L5  . Abdominal hysterectomy  09/19/2001    hx. of fibroids. TAH. Ovaries remain.   . Eye surgery Bilateral     lens implant  . Maximum access (mas)posterior lumbar interbody fusion (plif)  1 level N/A 07/03/2013    Procedure: FOR MAXIMUM ACCESS (MAS) POSTERIOR LUMBAR INTERBODY FUSION (PLIF) 1 LEVEL;  Surgeon: Eustace Moore, MD;  Location: [facility];  Service: Neurosurgery;  Laterality: N/A;  FOR MAXIMUM ACCESS (MAS) POSTERIOR LUMBAR INTERBODY FUSION (PLIF) 1 LEVEL (L4-L5)  . Back surgery  06/2013  . Port-a-cath removal Right 11/26/2013    Procedure: REMOVAL PORT-A-CATH;  Surgeon: Gaye Pollack, MD;  Location: [facility];  Service: Thoracic;  Laterality: Right;    REVIEW OF SYSTEMS:  A comprehensive review of systems was negative.   PHYSICAL EXAMINATION: General appearance: alert, cooperative and no distress Head: Normocephalic, without obvious abnormality, atraumatic Neck: no adenopathy Lymph nodes: Cervical, supraclavicular, and axillary nodes normal. Resp: clear to auscultation bilaterally Cardio: regular rate and rhythm, S1, S2 normal, no murmur, click, rub or gallop GI: soft, non-tender; bowel sounds normal; no masses,  no organomegaly Extremities: extremities normal, atraumatic, no cyanosis or edema  ECOG PERFORMANCE STATUS: 1 - Symptomatic but completely ambulatory  Blood pressure 139/74, pulse 71, temperature 98.9 F (37.2 C), temperature source Oral, resp. rate 24, height '5\' 2"'$  (1.575 m),  weight 221 lb 4.8 oz (100.381 kg), SpO2 98 %.  LABORATORY DATA: Lab Results  Component Value Date   WBC 8.1 02/23/2015   HGB 17.5* 02/23/2015   HCT 51.6* 02/23/2015   MCV 89.7 02/23/2015   PLT 187 02/23/2015      Chemistry      Component Value Date/Time   NA 137 02/23/2015 1712   NA 139 02/06/2015 0901   NA 141 07/14/2011 0934   K 3.7 02/23/2015 1712   K 3.9 02/06/2015 0901   K 3.4 07/14/2011 0934   CL 103 02/23/2015 1712   CL 102 01/18/2012 0805   CL 100 07/14/2011 0934   CO2 25 02/23/2015 1712   CO2 28 02/06/2015 0901   CO2 30 07/14/2011 0934   BUN 13 02/23/2015 1712   BUN 10.8 02/06/2015 0901   BUN 13 07/14/2011 0934   CREATININE 1.19* 02/23/2015 1712   CREATININE 1.5* 02/06/2015 0901   CREATININE 1.39* 11/06/2014 0923      Component Value Date/Time   CALCIUM 9.1 02/23/2015 1712   CALCIUM 9.6 02/06/2015 0901   CALCIUM 8.7 07/14/2011 0934   ALKPHOS 60 02/23/2015 1712   ALKPHOS 71 02/06/2015 0901   ALKPHOS 38 07/14/2011 0934   AST 16 02/23/2015 1712   AST 12 02/06/2015 0901   AST 19 07/14/2011 0934   ALT 17 02/23/2015 1712   ALT 16 02/06/2015 0901   ALT 20 07/14/2011 0934   BILITOT 1.5* 02/23/2015 1712   BILITOT 0.87 02/06/2015 0901   BILITOT 1.20 07/14/2011 0934       RADIOGRAPHIC STUDIES: Dg Chest 2 View  02/23/2015  CLINICAL DATA:  49 year old female with shortness breath and cough for the past 4 days. Dizziness. EXAM: CHEST  2 VIEW COMPARISON:  Chest x-ray 11/26/2013. FINDINGS: Postoperative changes of left upper lobe wedge resection and chronic postradiation changes in the apex of the left hemithorax are again noted, with evidence of chronic left-sided volume loss, including mild elevation of the left hemidiaphragm (all of which is very similar to the prior study). No acute consolidative airspace disease. No pleural effusions. No evidence of pulmonary edema. Heart size is normal. Upper mediastinal contours are grossly distorted by postoperative and post  treatment related changes, as above. IMPRESSION: 1. No radiographic evidence of acute cardiopulmonary disease. The radiographic appearance of the chest is very similar to  prior studies, as above. Electronically Signed   By: Vinnie Langton M.D.   On: 02/23/2015 18:21   Ct Chest W Contrast  02/06/2015  CLINICAL DATA:  Followup lung cancer. Status post chemo and XRT. Cough and shortness of breath. EXAM: CT CHEST WITH CONTRAST TECHNIQUE: Multidetector CT imaging of the chest was performed during intravenous contrast administration. CONTRAST:  49m OMNIPAQUE IOHEXOL 300 MG/ML  SOLN COMPARISON:  05/20/2014 FINDINGS: Mediastinum: Heart size is normal. No pericardial effusion. The trachea appears patent and is midline. Normal appearance of the esophagus. No enlarged mediastinal or hilar lymph nodes. No axillary or supraclavicular adenopathy. Lungs/Pleura: Pleural effusion. Postoperative changes from partial left upper lobectomy. Changes of external beam radiation are again noted within the medial aspect of the left upper lung zone. There is no suspicious pulmonary nodule or mass identified to suggest residual or recurrence of tumor. Upper Abdomen: There is no suspicious liver abnormality. The visualized portions of the pancreas and spleen are normal. Cyst is identified within the upper pole the left kidney. Musculoskeletal: No aggressive lytic or sclerotic bone lesions identified. IMPRESSION: 1. No acute findings and no evidence to suggest residual/recurrence of tumor. Electronically Signed   By: TKerby MoorsM.D.   On: 02/06/2015 11:21   ASSESSMENT AND PLAN: This is a very pleasant 49years old [place] female with recurrent non-small cell lung cancer status post concurrent chemoradiation and currently on observation with no evidence for disease progression. I discussed the scan results with the patient. I recommended for her to continue on observation with repeat CT scan of the chest in 6 months. For  smoke cessation, I strongly encouraged the patient to quit smoking and offered her smoke cessation program. She was advised to call immediately if she has any concerning symptoms in the interval. The patient voices understanding of current disease status and treatment options and is in agreement with the current care plan.  All questions were answered. The patient knows to call the clinic with any problems, questions or concerns. We can certainly see the patient much sooner if necessary.  Disclaimer: This note was dictated with voice recognition software. Similar sounding words can inadvertently be transcribed and may not be corrected upon review.

## 2015-02-26 NOTE — Telephone Encounter (Signed)
Gave and printed appt sched and avs for pt for DEC 2017

## 2015-03-16 ENCOUNTER — Other Ambulatory Visit: Payer: Self-pay | Admitting: Family Medicine

## 2015-03-17 ENCOUNTER — Encounter: Payer: Self-pay | Admitting: Internal Medicine

## 2015-04-06 ENCOUNTER — Other Ambulatory Visit: Payer: Self-pay | Admitting: Family Medicine

## 2015-04-16 ENCOUNTER — Other Ambulatory Visit: Payer: Self-pay | Admitting: Neurological Surgery

## 2015-04-16 DIAGNOSIS — M961 Postlaminectomy syndrome, not elsewhere classified: Secondary | ICD-10-CM

## 2015-04-22 ENCOUNTER — Ambulatory Visit
Admission: RE | Admit: 2015-04-22 | Discharge: 2015-04-22 | Disposition: A | Discharge status: Home / Self Care | Payer: Medicare Other | Source: Ambulatory Visit | Attending: Neurological Surgery | Admitting: Neurological Surgery

## 2015-04-22 VITALS — BP 144/78 | HR 66

## 2015-04-22 DIAGNOSIS — M961 Postlaminectomy syndrome, not elsewhere classified: Secondary | ICD-10-CM

## 2015-04-22 DIAGNOSIS — Z981 Arthrodesis status: Secondary | ICD-10-CM

## 2015-04-22 MED ORDER — IOHEXOL 180 MG/ML  SOLN
15.0000 mL | Freq: Once | INTRAMUSCULAR | Status: AC | PRN
  Administered 2015-04-22: 15 mL via INTRATHECAL

## 2015-04-22 MED ORDER — DIAZEPAM 5 MG PO TABS
10.0000 mg | ORAL_TABLET | Freq: Once | ORAL | Status: AC
  Administered 2015-04-22: 5 mg via ORAL

## 2015-04-22 MED ORDER — MEPERIDINE HCL 100 MG/ML IJ SOLN
75.0000 mg | Freq: Once | INTRAMUSCULAR | Status: AC
  Administered 2015-04-22: 75 mg via INTRAMUSCULAR

## 2015-04-22 MED ORDER — ONDANSETRON HCL 4 MG/2ML IJ SOLN
4.0000 mg | Freq: Once | INTRAMUSCULAR | Status: AC
Start: 2015-04-22 — End: 2015-04-22
  Administered 2015-04-22: 4 mg via INTRAMUSCULAR

## 2015-04-22 NOTE — Discharge Instructions (Signed)
Myelogram Discharge Instructions  1. Go home and rest quietly for the next 24 hours.  It is important to lie flat for the next 24 hours.  Get up only to go to the restroom.  You may lie in the bed or on a couch on your back, your stomach, your left side or your right side.  You may have one pillow under your head.  You may have pillows between your knees while you are on your side or under your knees while you are on your back.  2. DO NOT drive today.  Recline the seat as far back as it will go, while still wearing your seat belt, on the way home.  3. You may get up to go to the bathroom as needed.  You may sit up for 10 minutes to eat.  You may resume your normal diet and medications unless otherwise indicated.  Drink lots of extra fluids today and tomorrow.  4. The incidence of headache, nausea, or vomiting is about 5% (one in 20 patients).  If you develop a headache, lie flat and drink plenty of fluids until the headache goes away.  Caffeinated beverages may be helpful.  If you develop severe nausea and vomiting or a headache that does not go away with flat bed rest, call [phone].  5. You may resume normal activities after your 24 hours of bed rest is over; however, do not exert yourself strongly or do any heavy lifting tomorrow. If when you get up you have a headache when standing, go back to bed and force fluids for another 24 hours.  6. Call your physician for a follow-up appointment.  The results of your myelogram will be sent directly to your physician by the following day.  7. If you have any questions or if complications develop after you arrive home, please call [phone].  Discharge instructions have been explained to the patient.  The patient, or the person responsible for the patient, fully understands these instructions.      May resume Prozac on Feb. 9, 2017, after 11:00 am.

## 2015-04-22 NOTE — Progress Notes (Signed)
Patient states she has been off Prozac for the past two days.

## 2015-05-11 ENCOUNTER — Other Ambulatory Visit: Payer: Self-pay | Admitting: Family Medicine

## 2015-05-11 NOTE — Telephone Encounter (Signed)
LM for patient to call back.  Please assist her in making an appt to follow up on her diabetes and cholesterol. Theresa [name],CMA

## 2015-05-11 NOTE — Telephone Encounter (Signed)
Please call. Needs appointment to discuss diabetes and cholesterol. I have refilled her lipitor medication for 2 months but needs to be seen prior to additional refills.

## 2015-05-13 ENCOUNTER — Other Ambulatory Visit: Payer: Self-pay | Admitting: Family Medicine

## 2015-06-10 ENCOUNTER — Other Ambulatory Visit: Payer: Self-pay | Admitting: Family Medicine

## 2015-08-06 ENCOUNTER — Other Ambulatory Visit: Payer: Self-pay | Admitting: Family Medicine

## 2015-08-10 ENCOUNTER — Other Ambulatory Visit: Payer: Self-pay | Admitting: Family Medicine

## 2015-08-17 ENCOUNTER — Other Ambulatory Visit: Payer: Self-pay | Admitting: Family Medicine

## 2015-08-18 NOTE — Telephone Encounter (Signed)
Pt calling in again about her insulin refill. Keefe Zawistowski Kennon Holter, CMA

## 2015-11-11 ENCOUNTER — Other Ambulatory Visit: Payer: Self-pay | Admitting: Obstetrics and Gynecology

## 2015-11-11 ENCOUNTER — Other Ambulatory Visit: Payer: Self-pay | Admitting: Family Medicine

## 2015-11-11 NOTE — Telephone Encounter (Signed)
Needs appointment for additional refills. Has not been seen in a while.

## 2015-11-17 ENCOUNTER — Other Ambulatory Visit: Payer: Self-pay | Admitting: Family Medicine

## 2015-11-17 DIAGNOSIS — E785 Hyperlipidemia, unspecified: Secondary | ICD-10-CM

## 2015-11-17 DIAGNOSIS — I10 Essential (primary) hypertension: Secondary | ICD-10-CM

## 2015-12-17 ENCOUNTER — Other Ambulatory Visit: Payer: Self-pay | Admitting: Neurological Surgery

## 2015-12-24 ENCOUNTER — Telehealth: Payer: Self-pay | Admitting: *Deleted

## 2015-12-24 NOTE — Telephone Encounter (Signed)
Called patient to offer to schedule Annual Wellness Visit. Patient has upcoming back surgery on 01/01/2016. She would like to have AWV after the new year and will call back at that time to schedule. Velora Heckler, RN

## 2015-12-29 ENCOUNTER — Ambulatory Visit (HOSPITAL_COMMUNITY): Payer: Medicare Other | Admitting: Anesthesiology

## 2015-12-29 ENCOUNTER — Ambulatory Visit (HOSPITAL_COMMUNITY): Payer: Medicare Other | Admitting: Emergency Medicine

## 2015-12-30 ENCOUNTER — Encounter (HOSPITAL_COMMUNITY): Payer: Self-pay

## 2015-12-30 ENCOUNTER — Encounter (HOSPITAL_COMMUNITY)
Admission: RE | Admit: 2015-12-30 | Discharge: 2015-12-30 | Disposition: A | Discharge status: Home / Self Care | Payer: Medicare Other | Source: Ambulatory Visit | Attending: Neurological Surgery | Admitting: Neurological Surgery

## 2015-12-30 ENCOUNTER — Ambulatory Visit (HOSPITAL_COMMUNITY)
Admission: RE | Admit: 2015-12-30 | Discharge: 2015-12-30 | Disposition: A | Discharge status: Home / Self Care | Payer: Medicare Other | Source: Ambulatory Visit | Attending: Neurological Surgery | Admitting: Neurological Surgery

## 2015-12-30 DIAGNOSIS — E119 Type 2 diabetes mellitus without complications: Secondary | ICD-10-CM | POA: Diagnosis not present

## 2015-12-30 DIAGNOSIS — I10 Essential (primary) hypertension: Secondary | ICD-10-CM | POA: Diagnosis not present

## 2015-12-30 DIAGNOSIS — Z923 Personal history of irradiation: Secondary | ICD-10-CM | POA: Insufficient documentation

## 2015-12-30 DIAGNOSIS — Z01818 Encounter for other preprocedural examination: Secondary | ICD-10-CM | POA: Insufficient documentation

## 2015-12-30 DIAGNOSIS — E785 Hyperlipidemia, unspecified: Secondary | ICD-10-CM | POA: Diagnosis not present

## 2015-12-30 DIAGNOSIS — M961 Postlaminectomy syndrome, not elsewhere classified: Secondary | ICD-10-CM

## 2015-12-30 HISTORY — DX: Malignant hyperthermia due to anesthesia, initial encounter: T88.3XXA

## 2015-12-30 LAB — SURGICAL PCR SCREEN
MRSA, PCR: NEGATIVE
Staphylococcus aureus: NEGATIVE

## 2015-12-30 LAB — BASIC METABOLIC PANEL
Anion gap: 9 (ref 5–15)
BUN: 9 mg/dL (ref 6–20)
CO2: 25 mmol/L (ref 22–32)
Calcium: 9.3 mg/dL (ref 8.9–10.3)
Chloride: 98 mmol/L — ABNORMAL LOW (ref 101–111)
Creatinine, Ser: 1.39 mg/dL — ABNORMAL HIGH (ref 0.44–1.00)
GFR calc Af Amer: 50 mL/min — ABNORMAL LOW (ref >60)
GFR calc non Af Amer: 43 mL/min — ABNORMAL LOW (ref >60)
Glucose, Bld: 422 mg/dL — ABNORMAL HIGH (ref 65–99)
Potassium: 4.1 mmol/L (ref 3.5–5.1)
Sodium: 132 mmol/L — ABNORMAL LOW (ref 135–145)

## 2015-12-30 LAB — CBC WITH DIFFERENTIAL/PLATELET
Basophils Absolute: 0 10*3/uL (ref 0.0–0.1)
Basophils Relative: 0 %
Eosinophils Absolute: 0.3 10*3/uL (ref 0.0–0.7)
Eosinophils Relative: 3 %
HCT: 52.1 % — ABNORMAL HIGH (ref 36.0–46.0)
Hemoglobin: 18.1 g/dL — ABNORMAL HIGH (ref 12.0–15.0)
Lymphocytes Relative: 22 %
Lymphs Abs: 1.7 10*3/uL (ref 0.7–4.0)
MCH: 30 pg (ref 26.0–34.0)
MCHC: 34.7 g/dL (ref 30.0–36.0)
MCV: 86.3 fL (ref 78.0–100.0)
Monocytes Absolute: 0.7 10*3/uL (ref 0.1–1.0)
Monocytes Relative: 9 %
Neutro Abs: 5 10*3/uL (ref 1.7–7.7)
Neutrophils Relative %: 66 %
Platelets: 174 10*3/uL (ref 150–400)
RBC: 6.04 MIL/uL — ABNORMAL HIGH (ref 3.87–5.11)
RDW: 12.6 % (ref 11.5–15.5)
WBC: 7.6 10*3/uL (ref 4.0–10.5)

## 2015-12-30 LAB — PROTIME-INR
INR: 1.02
Prothrombin Time: 13.4 seconds (ref 11.4–15.2)

## 2015-12-30 LAB — GLUCOSE, CAPILLARY: Glucose-Capillary: 447 mg/dL — ABNORMAL HIGH (ref 65–99)

## 2015-12-30 NOTE — Pre-Procedure Instructions (Signed)
Theresa [name]  12/30/2015      Theresa [name], Theresa [name] [address] Phone: [phone] Fax: [phone]  [id number] [address]: [phone]: [phone] [facility] [address] [place]Phone: [phone]: [phone]   Your procedure is scheduled on Friday October 20   Report to [facility] 0730 A.M.  Call this number if you have problems the morning of surgery:  [phone]   Remember:  Do not eat food or drink liquids after midnight.   Take these medicines the morning of surgery with A SIP OF WATER albuterol (PROVENTIL HFA;VENTOLIN HFA) if needed, amLODipine (NORVASC), carvedilol (COREG) , gabapentin (NEURONTIN), ranitidine (ZANTAC) , Linzess  7 days prior to surgery STOP taking any Aspirin, Aleve, Naproxen, Ibuprofen, Motrin, Advil, Goody's, BC's, all herbal medications, fish oil, and all vitamins   How to Manage Your Diabetes Before and After Surgery  Why is it important to control my blood sugar before and after surgery? . Improving blood sugar levels before and after surgery helps healing and can limit problems. . A way of improving blood sugar control is eating a healthy diet by: o  Eating less sugar and carbohydrates o  Increasing activity/exercise o  Talking with your doctor about reaching your blood sugar goals . High blood sugars (greater than 180 mg/dL) can raise your risk of infections and slow your recovery, so you will need to focus on controlling your diabetes during the weeks before surgery. . Make sure that the doctor who takes care of your diabetes knows about your planned surgery including the date and location.  How do I manage my blood sugar before  surgery? . Check your blood sugar at least 4 times a day, starting 2 days before surgery, to make sure that the level is not too high or low. o Check your blood sugar the morning of your surgery when you wake up and every 2 hours until you get to the Short Stay unit. . If your blood sugar is less than 70 mg/dL, you will need to treat for low blood sugar: o Do not take insulin. o Treat a low blood sugar (less than 70 mg/dL) with  cup of clear juice (cranberry or apple), 4 glucose tablets, OR glucose gel. o Recheck blood sugar in 15 minutes after treatment (to make sure it is greater than 70 mg/dL). If your blood sugar is not greater than 70 mg/dL on recheck, call [phone] further instructions. . Report your blood sugar to the short stay nurse when you get to Short Stay.  . If you are admitted to the hospital after surgery: o Your blood sugar will be checked by the staff and you will probably be given insulin after surgery (instead of oral diabetes medicines) to make sure you have good blood sugar levels. o The goal for blood sugar control after surgery is 80-180 mg/dL.       WHAT DO I DO ABOUT MY DIABETES MEDICATION?   .Marland KitchenDo not take oral diabetes medicines (pills) the morning of surgery.       .Marland KitchenHE MORNING OF SURGERY, take _____7.5 units________ units of ____Lantus______insulin.     Do not wear jewelry,  make-up or nail polish.  Do not wear lotions, powders, or perfumes, or deoderant.  Do not shave 48 hours prior to surgery.    Do not bring valuables to the hospital.  [facility] is not responsible for any belongings or valuables.  Contacts, dentures or bridgework may not be worn into surgery.  Leave your suitcase in the car.  After surgery it may be brought to your room.  For patients admitted to the hospital, discharge time will be determined by your treatment team.  Patients discharged the day of surgery will not be allowed to drive home.    Special instructions:    [facility]- Preparing For Surgery  Before surgery, you can play an important role. Because skin is not sterile, your skin needs to be as free of germs as possible. You can reduce the number of germs on your skin by washing with CHG (chlorahexidine gluconate) Soap before surgery.  CHG is an antiseptic cleaner which kills germs and bonds with the skin to continue killing germs even after washing.  Please do not use if you have an allergy to CHG or antibacterial soaps. If your skin becomes reddened/irritated stop using the CHG.  Do not shave (including legs and underarms) for at least 48 hours prior to first CHG shower. It is OK to shave your face.  Please follow these instructions carefully.   1. Shower the NIGHT BEFORE SURGERY and the MORNING OF SURGERY with CHG.   2. If you chose to wash your hair, wash your hair first as usual with your normal shampoo.  3. After you shampoo, rinse your hair and body thoroughly to remove the shampoo.  4. Use CHG as you would any other liquid soap. You can apply CHG directly to the skin and wash gently with a scrungie or a clean washcloth.   5. Apply the CHG Soap to your body ONLY FROM THE NECK DOWN.  Do not use on open wounds or open sores. Avoid contact with your eyes, ears, mouth and genitals (private parts). Wash genitals (private parts) with your normal soap.  6. Wash thoroughly, paying special attention to the area where your surgery will be performed.  7. Thoroughly rinse your body with warm water from the neck down.  8. DO NOT shower/wash with your normal soap after using and rinsing off the CHG Soap.  9. Pat yourself dry with a CLEAN TOWEL.   10. Wear CLEAN PAJAMAS   11. Place CLEAN SHEETS on your bed the night of your first shower and DO NOT SLEEP WITH PETS.    Day of Surgery: Do not apply any deodorants/lotions. Please wear clean clothes to the hospital/surgery center.      Please read over the following fact sheets that you were  given.

## 2015-12-30 NOTE — Progress Notes (Signed)
PCP - Luiz Blare Cardiologist - denies saw Dr. Cathie Olden in the past for cardiac workup  Chest x-ray - 12/30/15 EKG - 12/30/15 Stress Test - 01/30/13 ECHO - patient stated that she had an echo but not documented in [place] that I could see Cardiac Cath - patient stated that [facility] but not documented in [facility]  Blood Sugar day of Pre admission visit 447 Patient ate 2 doughnuts and a half can of coke last night Took 15 units of lantus insulin this morning    Fasting Blood Sugar is 175-180 Patient verbalized understanding of blood sugar management before surgery  Son had [place] Hyperthermia about 16 years ago from a tonsil surgery - son has not formally been tested for [facility].  No other members of the family have been tested for MH.  Patient verbalized understanding of precautions reguarding the seriousness of MH.  Anesthesia saw patient and reviewed chart.  No changes with patient since last surgery in 2015  Wears CPAP at night unsure of settings.      Patient denies shortness of breath, fever, cough and chest pain at PAT appointment

## 2015-12-30 NOTE — Progress Notes (Addendum)
Anesthesia Note: Patient is a 50 year old female scheduled for lumbar spinal cord stimulator insertion on 01/01/16 by Dr. Ronnald Ramp. She is currently scheduled as a second case.  History includes smoking, HTN, HLD, glaucoma and retinitis pigmentosa, legally blind, DM2, anxiety, GERD, fibromyalgia, asthma, OSA (on CPAP), arthritis, chronic headaches, recurrent Ferdinand lung cancer s/p LUL trisegmentectomy with LN dissection '11 and chemoradiation, right IJ Port-a-cath 02/11/11 s/p removal 11/26/13, hysterectomy, L4-5 microdissection/foramintoimes with removal of synovial cyst 10/27/11, L4-5 PLIF 07/03/13. BMI is 40 consistent with morbid obesity. For anesthesia history, she reports that she is difficult for PIV access and that her son has a history of MALIGNANT HYPERTHERMIA.  She denies personal history of MH.  Her son's father is now deceased, but she is unaware if he had a history of MH.  In regards to her son's reported history of MH, she says that he was having a tonsillar abscess drained in the OR and was told his fever was very high.  He required ice packs and was taken to the ICU intubated.  He remained in the ICU for a few days but ultimately had no long-term adverse effects.  He or none of his family members have ever undergone any formal testing (ie, muscle biopsy).  Event happened approximately 17 years ago, and at the time they were told that if he required additional surgery that he would need to go somewhere like [facility]. Her son is now in the [employer]. I gave her information to the [facility] Anesthesia Department to inquire about their MH clinic/evaluation.    - HEM-ONC is Dr. Julien Nordmann.  - PCP is listed as Dr. Luiz Blare with [facility]. - She is not followed routinely by a cardiologist, but she was evaluated by Dr. Acie Fredrickson on 01/02/13 for palpitations (in the setting of hypokalemia) and abnormal EKG (inferolateral T wave inversion).   Meds include albuterol, amlodipine, Coreg, Neurontin, Lantus 15 units each  morning, Linzess, Zantac, Timoptic ophthalmic, Travatan ophthalmic, valsartan, Chantix.  BP 115/66   Pulse 88   Temp 36.8 C   Resp 20   Ht '5\' 2"'$  (1.575 m)   Wt 219 lb (99.3 kg)   SpO2 99%   BMI 40.06 kg/m   EKG 12/30/15: NSR, T wave abnormality, consider lateral ischemia. She has multiple comparison EKGs in Epic CHL and/or Muse. She had negative inferolateral T waves when she was seen by cardiology in 2014 and had subsequent testing. Inferior T wave abnormality has been less apparent in subsequent tracings. Lateral T wave abnormality has been fairly consistent, although today's tracings showed T wave inversion in high lateral leads as well, which has not been seen as much (except on on 10/17/11 tracing). She denied SOB and CP at PAT.   Nuclear stress test 01/24/13: Overall Impression: Low risk stress nuclear study with a small, mild, partially reversible defect most likely related to shifting breast attenuation. LV Ejection Fraction: 57%. LV Wall Motion: NL LV Function; NL Wall Motion.   Preoperative labs noted. Cr 1.39. K 4.1. Anion gap 9. H/H 189.1/52.1. Glucose is 422. She took her Lantus this morning. She reported eating two donuts and half a can of Coke last night, but had only had gum this morning. She reports that typically her fasting glucose runs in the 170s-180s range. Her A1c is still pending. Historically, her DM has not been well controlled since 01/2014 (A1c 9.7 07/31/14, but > 15.0 05/12/14). Patient was told that a significantly elevated glucose on the day of surgery may  cancel her case and that if her A1c was elevated her surgeon may chose to postpone her surgery due to associated risks. She was advised to monitor her glucose 2-3 times per day over the next few days and contact her PCP if numbers remain high.   In review of previous anesthesia records, desflurane was used 10/27/11 without known complication. (I don't see the family history of MH until entry on 06/25/13.) She did  receive a trigger free induction for her lumbar fusion 07/03/13.   Reviewed above with anesthesiologist Dr. Conrad Stuart. Unconfirmed family history of malignant hyperthermia. Has had both triggering and non-triggering anesthesia in the past. Case is posted for general anesthesia. Last case done trigger free. Anticipate trigger free induction if GA is indeed used. Several anesthesiologist felt okay to leave case as a second case, and could use filter or air out lines as needed. I forwarded this information to [facility], Immunologist.   Definitive anesthesia plan following anesthesiologist's evaluation of patient on the morning of surgery.   Chart will be left for follow-up A1c results.   George Hugh [facility] Stay [facility]/Anesthesiology Phone [phone] 12/30/2015 12:51 PM  Addendum: A1c results were still not up after 1 PM, so I contacted [employer] rep. Results faxed and noted to be elevated at 13.3 consistent with average glucose of 335. I have called these results and also her serum glucose of 422 to [facility] at Dr. Ronnald Ramp' office. Notified that if he does not want to postpone surgery then patient is at high risk to get cancelled due to hyperglycemia (particularly if she presents with fasting glucose > 200).  George Hugh [facility] Short Stay Center/Anesthesiology Phone [phone] 12/31/2015 2:55 PM

## 2015-12-31 LAB — HEMOGLOBIN A1C
Hgb A1c MFr Bld: 13.3 % — ABNORMAL HIGH (ref 4.8–5.6)
Mean Plasma Glucose: 335 mg/dL

## 2015-12-31 MED ORDER — VANCOMYCIN HCL 10 G IV SOLR
1500.0000 mg | INTRAVENOUS | Status: DC
  Filled 2015-12-31: qty 1500

## 2015-12-31 MED ORDER — DEXAMETHASONE SODIUM PHOSPHATE 10 MG/ML IJ SOLN
10.0000 mg | INTRAMUSCULAR | Status: DC
  Filled 2015-12-31: qty 1

## 2015-12-31 NOTE — Anesthesia Preprocedure Evaluation (Deleted)
Anesthesia Evaluation  Patient identified by MRN, date of birth, ID band Patient awake    Reviewed: Allergy & Precautions, NPO status , Patient's Chart, lab work & pertinent test results, reviewed documented beta blocker date and time   History of Anesthesia Complications (+) MALIGNANT HYPERTHERMIA, Family history of anesthesia reaction and history of anesthetic complications (Son with reported MH)  Airway Mallampati: III  TM Distance: >3 FB Neck ROM: Full    Dental  (+) Teeth Intact, Dental Advisory Given   Pulmonary asthma , sleep apnea and Continuous Positive Airway Pressure Ventilation , Current Smoker,    Pulmonary exam normal breath sounds clear to auscultation       Cardiovascular hypertension, Pt. on home beta blockers and Pt. on medications (-) angina(-) Past MI and (-) CHF Normal cardiovascular exam Rhythm:Regular Rate:Normal     Neuro/Psych  Headaches, PSYCHIATRIC DISORDERS Anxiety Depression Legally blind Failed back syndrome     GI/Hepatic Neg liver ROS, GERD  Medicated and Controlled,  Endo/Other  diabetes, Poorly Controlled, Type 2, Insulin DependentMorbid obesity  Renal/GU Renal InsufficiencyRenal disease     Musculoskeletal  (+) Arthritis , Osteoarthritis,  Fibromyalgia - (Neurontin)  Abdominal   Peds  Hematology negative hematology ROS (+)   Anesthesia Other Findings Day of surgery medications reviewed with the patient.  Reproductive/Obstetrics                            Anesthesia Physical Anesthesia Plan  ASA: III  Anesthesia Plan: General   Post-op Pain Management:    Induction: Intravenous  Airway Management Planned: Oral ETT  Additional Equipment:   Intra-op Plan:   Post-operative Plan: Extubation in OR  Informed Consent: I have reviewed the patients History and Physical, chart, labs and discussed the procedure including the risks, benefits and  alternatives for the proposed anesthesia with the patient or authorized representative who has indicated his/her understanding and acceptance.   Dental advisory given  Plan Discussed with: CRNA  Anesthesia Plan Comments: (Risks/benefits of general anesthesia discussed with patient including risk of damage to teeth, lips, gum, and tongue, nausea/vomiting, allergic reactions to medications, and the possibility of heart attack, stroke and death.  All patient questions answered.  Patient wishes to proceed.  **MH Precautions: flush anesthesia machine/circuit, MH filter, remove halogenated agents, no succinylcholine, plan for TIVA **Patient with poorly controlled diabetes. Will check DOS CBG.)        Anesthesia Quick Evaluation

## 2016-01-01 ENCOUNTER — Encounter (HOSPITAL_COMMUNITY): Payer: Self-pay | Admitting: *Deleted

## 2016-01-01 ENCOUNTER — Encounter (HOSPITAL_COMMUNITY)
Admission: RE | Disposition: A | Discharge status: Home / Self Care | Payer: Self-pay | Source: Ambulatory Visit | Attending: Neurological Surgery

## 2016-01-01 ENCOUNTER — Ambulatory Visit (HOSPITAL_COMMUNITY)
Admission: RE | Admit: 2016-01-01 | Discharge: 2016-01-01 | Disposition: A | Discharge status: Home / Self Care | Payer: Medicare Other | Source: Ambulatory Visit | Attending: Neurological Surgery | Admitting: Neurological Surgery

## 2016-01-01 DIAGNOSIS — Z6841 Body Mass Index (BMI) 40.0 and over, adult: Secondary | ICD-10-CM | POA: Diagnosis not present

## 2016-01-01 DIAGNOSIS — I10 Essential (primary) hypertension: Secondary | ICD-10-CM | POA: Diagnosis not present

## 2016-01-01 DIAGNOSIS — K219 Gastro-esophageal reflux disease without esophagitis: Secondary | ICD-10-CM | POA: Insufficient documentation

## 2016-01-01 DIAGNOSIS — M961 Postlaminectomy syndrome, not elsewhere classified: Secondary | ICD-10-CM | POA: Insufficient documentation

## 2016-01-01 DIAGNOSIS — M1990 Unspecified osteoarthritis, unspecified site: Secondary | ICD-10-CM | POA: Diagnosis not present

## 2016-01-01 DIAGNOSIS — Z5309 Procedure and treatment not carried out because of other contraindication: Secondary | ICD-10-CM | POA: Diagnosis not present

## 2016-01-01 DIAGNOSIS — Z9989 Dependence on other enabling machines and devices: Secondary | ICD-10-CM | POA: Diagnosis not present

## 2016-01-01 DIAGNOSIS — F17200 Nicotine dependence, unspecified, uncomplicated: Secondary | ICD-10-CM | POA: Insufficient documentation

## 2016-01-01 DIAGNOSIS — G4730 Sleep apnea, unspecified: Secondary | ICD-10-CM | POA: Insufficient documentation

## 2016-01-01 DIAGNOSIS — Z794 Long term (current) use of insulin: Secondary | ICD-10-CM | POA: Insufficient documentation

## 2016-01-01 DIAGNOSIS — H548 Legal blindness, as defined in USA: Secondary | ICD-10-CM | POA: Insufficient documentation

## 2016-01-01 DIAGNOSIS — E1165 Type 2 diabetes mellitus with hyperglycemia: Secondary | ICD-10-CM | POA: Diagnosis not present

## 2016-01-01 LAB — GLUCOSE, CAPILLARY: Glucose-Capillary: 206 mg/dL — ABNORMAL HIGH (ref 65–99)

## 2016-01-01 SURGERY — INSERTION, SPINAL CORD STIMULATOR, LUMBAR
Anesthesia: General | Site: Back

## 2016-01-01 MED ORDER — LIDOCAINE 2% (20 MG/ML) 5 ML SYRINGE
INTRAMUSCULAR | Status: AC
  Filled 2016-01-01: qty 5

## 2016-01-01 MED ORDER — MIDAZOLAM HCL 2 MG/2ML IJ SOLN
INTRAMUSCULAR | Status: AC
  Filled 2016-01-01: qty 2

## 2016-01-01 MED ORDER — DEXAMETHASONE SODIUM PHOSPHATE 10 MG/ML IJ SOLN
INTRAMUSCULAR | Status: AC
  Filled 2016-01-01: qty 1

## 2016-01-01 MED ORDER — SUGAMMADEX SODIUM 200 MG/2ML IV SOLN
INTRAVENOUS | Status: AC
  Filled 2016-01-01: qty 2

## 2016-01-01 MED ORDER — BUPIVACAINE HCL (PF) 0.25 % IJ SOLN
INTRAMUSCULAR | Status: AC
  Filled 2016-01-01: qty 30

## 2016-01-01 MED ORDER — PROPOFOL 10 MG/ML IV BOLUS
INTRAVENOUS | Status: AC
  Filled 2016-01-01: qty 20

## 2016-01-01 MED ORDER — CHLORHEXIDINE GLUCONATE CLOTH 2 % EX PADS: 6.0000 | MEDICATED_PAD | Freq: Once | CUTANEOUS | Status: DC

## 2016-01-01 MED ORDER — FENTANYL CITRATE (PF) 100 MCG/2ML IJ SOLN
INTRAMUSCULAR | Status: AC
Start: 2016-01-01 — End: 2016-01-01
  Filled 2016-01-01: qty 4

## 2016-01-01 MED ORDER — ROCURONIUM BROMIDE 10 MG/ML (PF) SYRINGE
PREFILLED_SYRINGE | INTRAVENOUS | Status: AC
  Filled 2016-01-01: qty 10

## 2016-01-01 MED ORDER — THROMBIN 5000 UNITS EX SOLR
CUTANEOUS | Status: AC
  Filled 2016-01-01: qty 15000

## 2016-01-01 MED ORDER — ONDANSETRON HCL 4 MG/2ML IJ SOLN
INTRAMUSCULAR | Status: AC
  Filled 2016-01-01: qty 2

## 2016-01-01 NOTE — Progress Notes (Signed)
Patient ID: Theresa [name], female   DOB: 06/26/1965, 50 y.o.   MRN: [id number] We will cancel her surgery this morning because of her blood glucose levels. Her HbA1c was 13.3 meaning that her sugars are very poorly controlled. This will significantly increase the risk of infection. Since we are placing a foreign body I think we should cancel and get her blood glucose levels under more tight control. I've explained this to her in detail. She has demonstrated understanding.

## 2016-01-01 NOTE — H&P (Signed)
Surgery was canceled today because of her uncontrolled serum glucose levels and she was discharged

## 2016-01-08 ENCOUNTER — Ambulatory Visit (INDEPENDENT_AMBULATORY_CARE_PROVIDER_SITE_OTHER): Payer: Medicare Other | Admitting: Obstetrics and Gynecology

## 2016-01-08 ENCOUNTER — Encounter: Payer: Self-pay | Admitting: Obstetrics and Gynecology

## 2016-01-08 VITALS — BP 135/86 | HR 70 | Temp 98.1°F | Wt 220.0 lb

## 2016-01-08 DIAGNOSIS — I10 Essential (primary) hypertension: Secondary | ICD-10-CM | POA: Diagnosis not present

## 2016-01-08 DIAGNOSIS — Z794 Long term (current) use of insulin: Secondary | ICD-10-CM

## 2016-01-08 DIAGNOSIS — R8290 Unspecified abnormal findings in urine: Secondary | ICD-10-CM | POA: Diagnosis not present

## 2016-01-08 DIAGNOSIS — N898 Other specified noninflammatory disorders of vagina: Secondary | ICD-10-CM | POA: Diagnosis not present

## 2016-01-08 DIAGNOSIS — Z981 Arthrodesis status: Secondary | ICD-10-CM

## 2016-01-08 DIAGNOSIS — F17200 Nicotine dependence, unspecified, uncomplicated: Secondary | ICD-10-CM

## 2016-01-08 DIAGNOSIS — E785 Hyperlipidemia, unspecified: Secondary | ICD-10-CM

## 2016-01-08 DIAGNOSIS — E1165 Type 2 diabetes mellitus with hyperglycemia: Secondary | ICD-10-CM

## 2016-01-08 LAB — POCT URINALYSIS DIPSTICK
Bilirubin, UA: NEGATIVE
Glucose, UA: NEGATIVE
Ketones, UA: NEGATIVE
Leukocytes, UA: NEGATIVE
Nitrite, UA: NEGATIVE
Protein, UA: 30
Spec Grav, UA: 1.015
Urobilinogen, UA: 4
pH, UA: 6.5

## 2016-01-08 LAB — POCT UA - MICROSCOPIC ONLY

## 2016-01-08 LAB — POCT WET PREP (WET MOUNT)
Clue Cells Wet Prep Whiff POC: NEGATIVE
Trichomonas Wet Prep HPF POC: ABSENT

## 2016-01-08 MED ORDER — METFORMIN HCL ER 500 MG PO TB24: 500.0000 mg | ORAL_TABLET | Freq: Every day | ORAL | 0 refills | Status: DC

## 2016-01-08 MED ORDER — ASPIRIN EC 81 MG PO TBEC: 81.0000 mg | DELAYED_RELEASE_TABLET | Freq: Every day | ORAL | 11 refills | Status: AC

## 2016-01-08 MED ORDER — INSULIN GLARGINE 100 UNIT/ML SOLOSTAR PEN: PEN_INJECTOR | SUBCUTANEOUS | 2 refills | Status: DC

## 2016-01-08 MED ORDER — VARENICLINE TARTRATE 1 MG PO TABS: 1.0000 mg | ORAL_TABLET | Freq: Two times a day (BID) | ORAL | 1 refills | Status: DC

## 2016-01-08 MED ORDER — RANITIDINE HCL 150 MG PO TABS: 150.0000 mg | ORAL_TABLET | Freq: Two times a day (BID) | ORAL | 3 refills | Status: DC

## 2016-01-08 MED ORDER — ATORVASTATIN CALCIUM 40 MG PO TABS: 40.0000 mg | ORAL_TABLET | Freq: Every day | ORAL | 3 refills | Status: DC

## 2016-01-08 MED ORDER — AMLODIPINE BESYLATE 10 MG PO TABS: ORAL_TABLET | ORAL | 3 refills | Status: DC

## 2016-01-08 MED ORDER — GABAPENTIN 300 MG PO CAPS: ORAL_CAPSULE | ORAL | 0 refills | Status: DC

## 2016-01-08 MED ORDER — VALSARTAN 160 MG PO TABS: ORAL_TABLET | ORAL | 3 refills | Status: DC

## 2016-01-08 MED ORDER — CARVEDILOL 25 MG PO TABS: 25.0000 mg | ORAL_TABLET | Freq: Two times a day (BID) | ORAL | 0 refills | Status: DC

## 2016-01-08 MED ORDER — GLIPIZIDE 10 MG PO TABS: 10.0000 mg | ORAL_TABLET | Freq: Every day | ORAL | 1 refills | Status: DC

## 2016-01-08 NOTE — Progress Notes (Signed)
Subjective: Chief Complaint  Patient presents with  . Diabetes     HPI: Theresa [name] is a 50 y.o. presenting to clinic today to discuss the following:  #Diabetes:  Last A1c 13.3 (9d ago) Sugars - first thing in the morning in the 200s  Taking medications: Lantus 15U only not Aspirin On statin ROS: denies fever, chills, dizziness, LOC, polyuria, polydipsia, numbness or tingling in extremities or chest pain, visual disturbances.  #Hypertension Blood pressure at home: controlled Blood pressure today: wnl Taking Meds: yes Side effects: none ROS: Denies headache, dizziness, visual changes, nausea, vomiting, chest pain, abdominal pain or shortness of breath.  #Tobacco use H/o lung cancer Continues to smoker States that a pack last her about 3 days Was on chantix which helped  #Lumbar pain Surgery canceled on 10/20 due to patient having hyperglycemia Surgery was to place lumbar spinal cord stimulator  Had spinal cord fusion last year Planning for surgery again Nov. 22  #Vaginal discharge Having vaginal discharge for a few days Medications tried: none Discharge consistency: thin Discharge color: white Associated odor, some itching Symptoms Fever: no Dysuria: no Vaginal bleeding: no Abdomen or Pelvic pain: no  Needs medication refills  ROS noted in HPI.  Past Medical, Surgical, Social, and Family History Reviewed & Updated per EMR.   Objective: BP 135/86   Pulse 70   Temp 98.1 F (36.7 C) (Oral)   Wt 220 lb (99.8 kg)   SpO2 96%   BMI 40.24 kg/m  Vitals and nursing notes reviewed  Physical Exam  Constitutional: She is well-developed, well-nourished, and in no distress.  Cardiovascular: Normal rate and regular rhythm.   Pulmonary/Chest: Effort normal and breath sounds normal.  Genitourinary: Vaginal discharge found.  Musculoskeletal:  Lumbar back pain  Neurological:  Grossly non-focal  Skin: Skin is warm and dry.    Assessment/Plan: Please  see problem based Assessment and Plan   Orders Placed This Encounter  Procedures  . POCT Wet Prep [employer])  . Urinalysis Dipstick  . POCT UA - Microscopic Only    Meds ordered this encounter  Medications  . Insulin Glargine (LANTUS SOLOSTAR) 100 UNIT/ML Solostar Pen    Sig: INJECT 20 UNITS EVERY MORNING    Dispense:  15 pen    Refill:  2  . amLODipine (NORVASC) 10 MG tablet    Sig: TAKE 1 TABLET BY MOUTH EVERY DAY, FOLLOW UP WITH PCP FOR REFILLS    Dispense:  90 tablet    Refill:  3    **Patient requests 90 days supply**  . carvedilol (COREG) 25 MG tablet    Sig: Take 1 tablet (25 mg total) by mouth 2 (two) times daily with a meal. Need PCP follow-up for further refills.    Dispense:  120 tablet    Refill:  0  . valsartan (DIOVAN) 160 MG tablet    Sig: TAKE 1 TABLET (160 MG TOTAL) BY MOUTH DAILY.    Dispense:  90 tablet    Refill:  3  . ranitidine (ZANTAC) 150 MG tablet    Sig: Take 1 tablet (150 mg total) by mouth 2 (two) times daily.    Dispense:  60 tablet    Refill:  3  . aspirin EC 81 MG tablet    Sig: Take 1 tablet (81 mg total) by mouth daily.    Dispense:  30 tablet    Refill:  11  . atorvastatin (LIPITOR) 40 MG tablet    Sig: Take  1 tablet (40 mg total) by mouth daily.    Dispense:  90 tablet    Refill:  3  . DISCONTD: glipiZIDE (GLUCOTROL) 10 MG tablet    Sig: Take 1 tablet (10 mg total) by mouth daily before breakfast.    Dispense:  30 tablet    Refill:  1  . gabapentin (NEURONTIN) 300 MG capsule    Sig: TAKE 1 CAPSULE BY MOUTH THREE TIMES DAILY    Dispense:  270 capsule    Refill:  0    **Patient requests 90 days supply**  . varenicline (CHANTIX) 1 MG tablet    Sig: Take 1 tablet (1 mg total) by mouth 2 (two) times daily.    Dispense:  60 tablet    Refill:  1  . metFORMIN (GLUCOPHAGE-XR) 500 MG 24 hr tablet    Sig: Take 1 tablet (500 mg total) by mouth daily with breakfast.    Dispense:  30 tablet    Refill:  0     Luiz Blare,  DO 01/08/2016, 9:08 AM PGY-3, [place]

## 2016-01-08 NOTE — Patient Instructions (Addendum)
It was nice meeting you today!  Here are some of the things we discussed today: Medication refills given Lantus increased to 20U daily Metformin started again call if you are unable to tolerate Will call you about results collected today QUIT SMOKING!!!    Please schedule a follow-up appointment for pharmacy clinic in 1-2 weeks and then follow-up in 3-4 weeks for a physical with me. You have several health maintenance topics that are due.    Thanks for allowing me to be a part of your care! Dr. Gerarda Fraction

## 2016-01-11 ENCOUNTER — Telehealth: Payer: Self-pay | Admitting: Obstetrics and Gynecology

## 2016-01-11 NOTE — Telephone Encounter (Signed)
Pt reports even with new medication sugars are still running in 200 range. Pt states she is not binge eating or eating sweets. Please advise. Thanks! ep

## 2016-01-13 ENCOUNTER — Other Ambulatory Visit: Payer: Self-pay | Admitting: *Deleted

## 2016-01-13 ENCOUNTER — Other Ambulatory Visit: Payer: Self-pay | Admitting: Obstetrics and Gynecology

## 2016-01-13 DIAGNOSIS — N898 Other specified noninflammatory disorders of vagina: Secondary | ICD-10-CM | POA: Insufficient documentation

## 2016-01-13 NOTE — Telephone Encounter (Signed)
Left message for pt to return my call.

## 2016-01-13 NOTE — Telephone Encounter (Signed)
Called but no answer from patient. Left voicemail to call back. Please relay to patient that if she has tolerated her metformin she can increase to 2 pills (totalling '1000mg'$ ) daily. However, metformin usually takes about 2 weeks to see affects on blood sugars. We will continue to monitor. She should keep her follow-up appointment.

## 2016-01-13 NOTE — Assessment & Plan Note (Signed)
Uncontrolled diabetes. Most recent A1c 13. Will increase her Lantus to 20U daily. Long discussion with patient about starting an oral medication or adding another injectable to regimen. She wants to start with oral medication. Allergies state she had intolerance to metformin. Was previously on glipizide but discontinued for unknown reason. Patient would like to restart metformin. She understands side effects. Will give extended release metformin '500mg'$  daily. Titrate up slowly as tolerated. Start ASA. Follow-up pharmacy clinic in 1-2 weeks and then follow-up in 3-4 weeks with me.

## 2016-01-13 NOTE — Assessment & Plan Note (Signed)
Wet prep and UA normal. No concerning discharge present on physical exam. Follow-up prn. Symptoms most likely secondary to uncontrolled blood sugars.

## 2016-01-13 NOTE — Assessment & Plan Note (Signed)
Long-standing hypertension. Blood pressure at goal today. Continue amlodipine, valsartan, and coreg. Refills given.

## 2016-01-13 NOTE — Assessment & Plan Note (Signed)
Continues to smoke tobacco despite h/o lung cancer. Would like to try chantix again. Rx given. Encouraged tobacco cessation.

## 2016-01-13 NOTE — Telephone Encounter (Signed)
Medications were just refilled at most recent visit. Will refuse this refill.

## 2016-01-13 NOTE — Assessment & Plan Note (Signed)
Lumbar fusion in 2015. Was to have lumbar spinal cord stimulator but surgery canceled due to uncontrolled diabetes that could affect post-procedural wound care and increases patient's infectious risk. Rescheduled for Nov. 22.

## 2016-01-14 ENCOUNTER — Other Ambulatory Visit: Payer: Self-pay | Admitting: Obstetrics and Gynecology

## 2016-01-14 DIAGNOSIS — I10 Essential (primary) hypertension: Secondary | ICD-10-CM

## 2016-01-14 DIAGNOSIS — E785 Hyperlipidemia, unspecified: Secondary | ICD-10-CM

## 2016-01-18 ENCOUNTER — Telehealth: Payer: Self-pay | Admitting: Obstetrics and Gynecology

## 2016-01-18 NOTE — Telephone Encounter (Signed)
Per pt in another telephone encounter her sugars are better. This has been routed to [place] for her information .

## 2016-01-18 NOTE — Telephone Encounter (Signed)
Pt wanted to let Dr. Gerarda Fraction know her sugars are fine now. Please advise. Thanks! ep

## 2016-01-18 NOTE — Telephone Encounter (Signed)
Noted  

## 2016-01-18 NOTE — Telephone Encounter (Signed)
Will forward to MD to make aware. Donevan Biller,CMA

## 2016-01-19 ENCOUNTER — Other Ambulatory Visit: Payer: Self-pay | Admitting: *Deleted

## 2016-01-19 ENCOUNTER — Ambulatory Visit (INDEPENDENT_AMBULATORY_CARE_PROVIDER_SITE_OTHER): Payer: Medicare Other | Admitting: Family Medicine

## 2016-01-19 ENCOUNTER — Encounter: Payer: Self-pay | Admitting: Family Medicine

## 2016-01-19 VITALS — BP 134/76 | HR 63 | Temp 97.9°F

## 2016-01-19 DIAGNOSIS — J029 Acute pharyngitis, unspecified: Secondary | ICD-10-CM

## 2016-01-19 DIAGNOSIS — K219 Gastro-esophageal reflux disease without esophagitis: Secondary | ICD-10-CM

## 2016-01-19 LAB — POCT RAPID STREP A (OFFICE): Rapid Strep A Screen: NEGATIVE

## 2016-01-19 MED ORDER — ALBUTEROL SULFATE HFA 108 (90 BASE) MCG/ACT IN AERS: 2.0000 | INHALATION_SPRAY | RESPIRATORY_TRACT | 2 refills | Status: DC | PRN

## 2016-01-19 MED ORDER — OMEPRAZOLE 20 MG PO CPDR: 20.0000 mg | DELAYED_RELEASE_CAPSULE | Freq: Every day | ORAL | 0 refills | Status: DC

## 2016-01-19 MED ORDER — PEN NEEDLES 32G X 4 MM MISC: 1.0000 | Freq: Every day | 1 refills | Status: DC

## 2016-01-19 MED ORDER — METFORMIN HCL ER 500 MG PO TB24: 500.0000 mg | ORAL_TABLET | Freq: Every day | ORAL | 6 refills | Status: DC

## 2016-01-19 MED ORDER — LINACLOTIDE 290 MCG PO CAPS: ORAL_CAPSULE | ORAL | 1 refills | Status: DC

## 2016-01-19 NOTE — Patient Instructions (Addendum)
It was nice seeing you today. Your strep test is negative. You likely have GERD/Reflux as the cause of your symptoms. We will have you try Omeprazole for two weeks. Please your PCP soon if no improvement. She will refer you to GI for endoscopy and colonoscopy. You may hold Ranitidin for now.  Gastroesophageal Reflux Disease, Adult Normally, food travels down the esophagus and stays in the stomach to be digested. If a person has gastroesophageal reflux disease (GERD), food and stomach acid move back up into the esophagus. When this happens, the esophagus becomes sore and swollen (inflamed). Over time, GERD can make small holes (ulcers) in the lining of the esophagus. HOME CARE Diet  Follow a diet as told by your doctor. You may need to avoid foods and drinks such as:  Coffee and tea (with or without caffeine).  Drinks that contain alcohol.  Energy drinks and sports drinks.  Carbonated drinks or sodas.  Chocolate and cocoa.  Peppermint and mint flavorings.  Garlic and onions.  Horseradish.  Spicy and acidic foods, such as peppers, chili powder, curry powder, vinegar, hot sauces, and BBQ sauce.  Citrus fruit juices and citrus fruits, such as oranges, lemons, and limes.  Tomato-based foods, such as red sauce, chili, salsa, and pizza with red sauce.  Fried and fatty foods, such as donuts, french fries, potato chips, and high-fat dressings.  High-fat meats, such as hot dogs, rib eye steak, sausage, ham, and bacon.  High-fat dairy items, such as whole milk, butter, and cream cheese.  Eat small meals often. Avoid eating large meals.  Avoid drinking large amounts of liquid with your meals.  Avoid eating meals during the 2-3 hours before bedtime.  Avoid lying down right after you eat.  Do not exercise right after you eat. General Instructions  Pay attention to any changes in your symptoms.  Take over-the-counter and prescription medicines only as told by your doctor. Do not  take aspirin, ibuprofen, or other NSAIDs unless your doctor says it is okay.  Do not use any tobacco products, including cigarettes, chewing tobacco, and e-cigarettes. If you need help quitting, ask your doctor.  Wear loose clothes. Do not wear anything tight around your waist.  Raise (elevate) the head of your bed about 6 inches (15 cm).  Try to lower your stress. If you need help doing this, ask your doctor.  If you are overweight, lose an amount of weight that is healthy for you. Ask your doctor about a safe weight loss goal.  Keep all follow-up visits as told by your doctor. This is important. GET HELP IF:  You have new symptoms.  You lose weight and you do not know why it is happening.  You have trouble swallowing, or it hurts to swallow.  You have wheezing or a cough that keeps happening.  Your symptoms do not get better with treatment.  You have a hoarse voice. GET HELP RIGHT AWAY IF:  You have pain in your arms, neck, jaw, teeth, or back.  You feel sweaty, dizzy, or light-headed.  You have chest pain or shortness of breath.  You throw up (vomit) and your throw up looks like blood or coffee grounds.  You pass out (faint).  Your poop (stool) is bloody or black.  You cannot swallow, drink, or eat.   This information is not intended to replace advice given to you by your health care provider. Make sure you discuss any questions you have with your health care provider.  Document Released: 08/17/2007 Document Revised: 11/19/2014 Document Reviewed: 06/25/2014 Elsevier Interactive Patient Education [employer].

## 2016-01-19 NOTE — Progress Notes (Signed)
Subjective:     Patient ID: Theresa [name], female   DOB: Dec 29, 1965, 50 y.o.   MRN: [id number]  HPI Throat pain: This started 3 days ago and worsened yesterday. Feels like something is stuck in her throat, she coughs a little bit but gets a horrible throat pain, she described the pain as being a sharp-dull pain radiating to her chest. She denies fever. She endorsed reflux of fluid from her chest to her mouth worsening in the last few days. Symptoms also worse when she belches, she also gets a strong taste in her mouth whenever she belches. She feels this is related to her heart burn, she is on Ranitidine which is not working, she tried Omeprazole in the past which was helpful. No N/V, no diarrhea, she has constipation from IBS. No blood in her stool She denies food trigger, at times she feels food choking in her throat.  Current Outpatient Prescriptions on File Prior to Visit  Medication Sig Dispense Refill  . albuterol (PROVENTIL HFA;VENTOLIN HFA) 108 (90 BASE) MCG/ACT inhaler Inhale 2 puffs into the lungs every 4 (four) hours as needed for wheezing or shortness of breath. 1 Inhaler 0  . amLODipine (NORVASC) 10 MG tablet TAKE 1 TABLET BY MOUTH EVERY DAY, FOLLOW UP WITH PCP FOR REFILLS 90 tablet 3  . aspirin EC 81 MG tablet Take 1 tablet (81 mg total) by mouth daily. 30 tablet 11  . atorvastatin (LIPITOR) 40 MG tablet Take 1 tablet (40 mg total) by mouth daily. 90 tablet 3  . carvedilol (COREG) 25 MG tablet Take 1 tablet (25 mg total) by mouth 2 (two) times daily with a meal. Need PCP follow-up for further refills. 120 tablet 0  . gabapentin (NEURONTIN) 300 MG capsule TAKE 1 CAPSULE BY MOUTH THREE TIMES DAILY 270 capsule 0  . Insulin Glargine (LANTUS SOLOSTAR) 100 UNIT/ML Solostar Pen INJECT 20 UNITS EVERY MORNING 15 pen 2  . LINZESS 290 MCG CAPS capsule TAKE 1 CAPSULE (290 MCG TOTAL) BY MOUTH DAILY. 30 capsule 1  . metFORMIN (GLUCOPHAGE-XR) 500 MG 24 hr tablet Take 1 tablet (500 mg total) by mouth  daily with breakfast. 30 tablet 0  . ranitidine (ZANTAC) 150 MG tablet Take 1 tablet (150 mg total) by mouth 2 (two) times daily. 60 tablet 3  . timolol (TIMOPTIC) 0.5 % ophthalmic solution Place 1 drop into both eyes 2 (two) times daily.    . Travoprost, BAK Free, (TRAVATAN) 0.004 % SOLN ophthalmic solution Place 1 drop into both eyes at bedtime.    . valsartan (DIOVAN) 160 MG tablet TAKE 1 TABLET (160 MG TOTAL) BY MOUTH DAILY. 90 tablet 3  . varenicline (CHANTIX) 1 MG tablet Take 1 tablet (1 mg total) by mouth 2 (two) times daily. 60 tablet 1   No current facility-administered medications on file prior to visit.    Past Medical History:  Diagnosis Date  . Anxiety   . Arthritis   . Asthma   . Blindness, legal    glaucoma and retinitis  pigmentosa  . Chronic eczema   . Chronic headaches 10-17-11   migraines  . Constipation   . Depression   . Diabetes mellitus type II    diet   . Family history of anesthesia complication    1 son had malignant hyperthermia at 5yr  tonsils rained  . Family history of malignant hyperthermia    1 son 16 years ago  . Fibromyalgia    denies, has not been diagnosited  .  GERD (gastroesophageal reflux disease)   . Glaucoma   . History of chemotherapy 01/2011 to 03/2011   concurrent w/radiation therapy  . Hx of radiation therapy 01-17-11 to 03-18-11   lung  . Hyperlipidemia   . Hypertension   . Lung cancer ([place])    lung ca dx 2010  . Malignant hyperthermia    As of 12/30/15, no personal MH history, but reported her son has a history of MH ~ 2000 (no confirmatory testing done)  . Onychomycosis   . Retinitis pigmentosa   . Shortness of breath    with exertion  . Sleep apnea    cpap 5 yrs     Review of Systems  HENT:       Throat pain  Respiratory: Negative.   Cardiovascular: Negative.   Gastrointestinal: Positive for constipation. Negative for abdominal pain, blood in stool, diarrhea and nausea.  All other systems reviewed and are  negative.      Vitals:   01/19/16 0901  BP: 134/76  Pulse: 63  Temp: 97.9 F (36.6 C)  TempSrc: Oral  SpO2: 98%    Objective:   Physical Exam  Constitutional: She appears well-developed and well-nourished. No distress.  HENT:  Head: Normocephalic.  Right Ear: Tympanic membrane and ear canal normal. Tympanic membrane is not injected and not erythematous. No hemotympanum.  Left Ear: Tympanic membrane and ear canal normal. Tympanic membrane is not injected. No hemotympanum.  Mouth/Throat: Uvula is midline, oropharynx is clear and moist and mucous membranes are normal. No oropharyngeal exudate, posterior oropharyngeal edema, posterior oropharyngeal erythema or tonsillar abscesses.  Neck: Neck supple.  Cardiovascular: Normal rate, regular rhythm and normal heart sounds.   Pulmonary/Chest: Effort normal and breath sounds normal. No respiratory distress.  Abdominal: Soft. Bowel sounds are normal. She exhibits no distension. There is no tenderness.  Lymphadenopathy:       Right cervical: No superficial cervical, no deep cervical and no posterior cervical adenopathy present.      Left cervical: No superficial cervical, no deep cervical and no posterior cervical adenopathy present.  Nursing note and vitals reviewed.      Assessment:     Throat pain: Likely GERD    Plan:     Likely GERD. Rapid Strep Neg. Tylenol as needed for pain. Omeprazole prescribed, to use for 2 wks. As discussed with her, she will benefit from Endoscopy and Colonoscopy given her age. She is advised to schedule two weeks follow up with her PCP for reassessment and referral to GI. She verbalized understanding.   Note: I e-scribed Omeprazole initially to [facility], but she stated she prefers [facility]. I called [facility], spoke with Glenard Haring) to cancel prescription.

## 2016-01-21 ENCOUNTER — Encounter (HOSPITAL_COMMUNITY): Payer: Self-pay | Admitting: Emergency Medicine

## 2016-01-21 ENCOUNTER — Ambulatory Visit (HOSPITAL_COMMUNITY)
Admission: EM | Admit: 2016-01-21 | Discharge: 2016-01-21 | Disposition: A | Discharge status: Home / Self Care | Payer: Medicare Other | Attending: Emergency Medicine | Admitting: Emergency Medicine

## 2016-01-21 DIAGNOSIS — M940 Chondrocostal junction syndrome [Tietze]: Secondary | ICD-10-CM | POA: Diagnosis not present

## 2016-01-21 DIAGNOSIS — K219 Gastro-esophageal reflux disease without esophagitis: Secondary | ICD-10-CM

## 2016-01-21 MED ORDER — SUCRALFATE 1 GM/10ML PO SUSP: 1.0000 g | Freq: Three times a day (TID) | ORAL | 0 refills | Status: DC

## 2016-01-21 MED ORDER — GI COCKTAIL ~~LOC~~
ORAL | Status: AC
  Filled 2016-01-21: qty 30

## 2016-01-21 MED ORDER — OMEPRAZOLE 20 MG PO CPDR: 20.0000 mg | DELAYED_RELEASE_CAPSULE | Freq: Every day | ORAL | 0 refills | Status: DC

## 2016-01-21 MED ORDER — GI COCKTAIL ~~LOC~~
30.0000 mL | Freq: Once | ORAL | Status: AC
  Administered 2016-01-21: 30 mL via ORAL

## 2016-01-21 MED ORDER — PREDNISONE 20 MG PO TABS: 20.0000 mg | ORAL_TABLET | Freq: Every day | ORAL | 0 refills | Status: DC

## 2016-01-21 NOTE — ED Triage Notes (Signed)
Patient reports pain in chest and with swallowing x 2 days. Patient reports she was diagnosed with GERD and given a prescription but it is not helping. She has recently started on Metformin and her blood sugars have been controlled. Patient is SOB on triage.

## 2016-01-21 NOTE — ED Provider Notes (Signed)
[facility]    CSN: [id number] Arrival date & time: 01/21/16  1755     History   Chief Complaint Chief Complaint  Patient presents with  . Gastroesophageal Reflux    HPI Theresa [name] is a 50 y.o. female.   HPI She is a 50 year old woman here with her son for evaluation of chest and throat pain. She was seen by her PCP 2 days ago for the same symptoms and diagnosed with reflux. She was started on omeprazole. She's been taking omeprazole multiple times a day without improvement. She is also supposed be taking Zantac, but has not been taking this. She reports pain in her throat and in her central chest. It is described as sharp. It is worse when she lays down. She has had some intermittent nausea, but no vomiting. Denies abdominal pain. She is concerned that she has an infection in her chest from her CPAP machine.  Past Medical History:  Diagnosis Date  . Anxiety   . Arthritis   . Asthma   . Blindness, legal    glaucoma and retinitis  pigmentosa  . Chronic eczema   . Chronic headaches 10-17-11   migraines  . Constipation   . Depression   . Diabetes mellitus type II    diet   . Family history of anesthesia complication    1 son had malignant hyperthermia at 61yr  tonsils rained  . Family history of malignant hyperthermia    1 son 16 years ago  . Fibromyalgia    denies, has not been diagnosited  . GERD (gastroesophageal reflux disease)   . Glaucoma   . History of chemotherapy 01/2011 to 03/2011   concurrent w/radiation therapy  . Hx of radiation therapy 01-17-11 to 03-18-11   lung  . Hyperlipidemia   . Hypertension   . [place]    lung ca dx 2010  . Malignant hyperthermia    As of 12/30/15, no personal MH history, but reported her son has a history of MH ~ 2000 (no confirmatory testing done)  . Onychomycosis   . Retinitis pigmentosa   . Shortness of breath    with exertion  . Sleep apnea    cpap 5 yrs    Patient Active Problem List   Diagnosis Date Noted  . Vaginal discharge 01/13/2016  . Joint pain 12/23/2014  . Hair loss 08/15/2014  . CN (constipation) 05/23/2014  . Abdominal pain, left upper quadrant 05/23/2014  . Neck pain on left side 01/29/2014  . Polycythemia, secondary 11/24/2013  . Abdominal pain, epigastric 11/22/2013  . S/P lumbar spinal fusion 07/03/2013  . Obstructive sleep apnea 12/21/2012  . Hirsutism 12/13/2011  . Hx of radiation therapy   . Blindness, legal   . Migraine 01/24/2011  . Bronchogenic cancer of left lung ([facility] 01/20/2011  . Glaucoma   . Retinitis pigmentosa   . Onychomycosis   . Chronic eczema   . Neuropathic pain 09/29/2010  . Chronic kidney disease (CKD), stage III (moderate) 05/17/2010  . LOW BACK PAIN 03/19/2010  . Hypercholesterolemia 04/17/2009  . Essential hypertension 04/17/2009  . Morbid obesity ([facility] [place] 10/02/2008  . Diabetes mellitus type 2, uncontrolled ([place] 02/01/2007  . Major depressive disorder, recurrent episode ([facility] [place] 05/11/2006  . TOBACCO DEPENDENCE 05/11/2006  . Osteoarthrosis, unspecified whether generalized or localized, involving lower leg 05/11/2006    Past Surgical History:  Procedure Laterality Date  . ABDOMINAL HYSTERECTOMY  09/19/2001   hx. of fibroids. TAH. Ovaries remain.   .Marland Kitchen  BACK SURGERY  06/2013  . CHEST TUBE INSERTION   04/01/08   L hydropneumothorax  . EYE SURGERY Bilateral    lens implant  . KNEE ARTHROSCOPY  10-17-11   bil. knee scope(left was torn ligament)  . LUMBAR LAMINECTOMY/DECOMPRESSION MICRODISCECTOMY  10/27/2011   Procedure: LUMBAR LAMINECTOMY/DECOMPRESSION MICRODISCECTOMY;  Surgeon: Johnn Hai, MD;  Location: [facility];  Service: Orthopedics;  Laterality: N/A;  L4-L5  . lung surgery  03/24/08   L vats, L thoracotomy and LUL trisegmentectomy with node dissection  . MAXIMUM ACCESS (MAS)POSTERIOR LUMBAR INTERBODY FUSION (PLIF) 1 LEVEL N/A 07/03/2013   Procedure: FOR MAXIMUM ACCESS (MAS) POSTERIOR LUMBAR INTERBODY FUSION (PLIF) 1 LEVEL;   Surgeon: Eustace Moore, MD;  Location: [place] [facility];  Service: Neurosurgery;  Laterality: N/A;  FOR MAXIMUM ACCESS (MAS) POSTERIOR LUMBAR INTERBODY FUSION (PLIF) 1 LEVEL (L4-L5)  . PORT-A-CATH REMOVAL Right 11/26/2013   Procedure: REMOVAL PORT-A-CATH;  Surgeon: Gaye Pollack, MD;  Location: [facility];  Service: Thoracic;  Laterality: Right;  . PORTACATH PLACEMENT  02/11/2011   Procedure: INSERTION PORT-A-CATH;  Surgeon: Pierre Bali, MD;  Location: [facility];  Service: Thoracic;  Laterality: Right;  9.6Fr. Pre-attached Power Port in Right Internal Jugular  -right chest-remains inplace 10-17-11    OB History    No data available       Home Medications    Prior to Admission medications   Medication Sig Start Date End Date Taking? Authorizing Provider  albuterol (PROVENTIL HFA;VENTOLIN HFA) 108 (90 Base) MCG/ACT inhaler Inhale 2 puffs into the lungs every 4 (four) hours as needed for wheezing or shortness of breath. 01/19/16  Yes Katheren Shams, DO  amLODipine (NORVASC) 10 MG tablet TAKE 1 TABLET BY MOUTH EVERY DAY, FOLLOW UP WITH PCP FOR REFILLS 01/08/16  Yes Katheren Shams, DO  aspirin EC 81 MG tablet Take 1 tablet (81 mg total) by mouth daily. 01/08/16  Yes Katheren Shams, DO  atorvastatin (LIPITOR) 40 MG tablet Take 1 tablet (40 mg total) by mouth daily. 01/08/16  Yes Katheren Shams, DO  carvedilol (COREG) 25 MG tablet Take 1 tablet (25 mg total) by mouth 2 (two) times daily with a meal. Need PCP follow-up for further refills. 01/08/16  Yes Katheren Shams, DO  clobetasol cream (TEMOVATE) 4.16 % Apply 1 application topically 3 (three) times daily.   Yes Historical Provider, MD  gabapentin (NEURONTIN) 300 MG capsule TAKE 1 CAPSULE BY MOUTH THREE TIMES DAILY 01/08/16  Yes Katheren Shams, DO  Insulin Glargine (LANTUS SOLOSTAR) 100 UNIT/ML Solostar Pen INJECT 20 UNITS EVERY MORNING 01/08/16  Yes Katheren Shams, DO  Insulin Pen Needle (PEN NEEDLES) 32G X 4 MM MISC 1 Stick by Does not apply route daily.  01/19/16  Yes Katheren Shams, DO  linaclotide (LINZESS) 290 MCG CAPS capsule TAKE 1 CAPSULE (290 MCG TOTAL) BY MOUTH DAILY. 01/19/16  Yes Katheren Shams, DO  metFORMIN (GLUCOPHAGE-XR) 500 MG 24 hr tablet Take 1 tablet (500 mg total) by mouth daily with breakfast. 01/19/16  Yes Katheren Shams, DO  timolol (TIMOPTIC) 0.5 % ophthalmic solution Place 1 drop into both eyes 2 (two) times daily.   Yes Historical Provider, MD  Travoprost, BAK Free, (TRAVATAN) 0.004 % SOLN ophthalmic solution Place 1 drop into both eyes at bedtime.   Yes Historical Provider, MD  valsartan (DIOVAN) 160 MG tablet TAKE 1 TABLET (160 MG TOTAL) BY MOUTH DAILY. 01/08/16  Yes Katheren Shams, DO  varenicline (  CHANTIX) 1 MG tablet Take 1 tablet (1 mg total) by mouth 2 (two) times daily. 01/08/16  Yes Katheren Shams, DO  omeprazole (PRILOSEC) 20 MG capsule Take 1 capsule (20 mg total) by mouth daily. 01/21/16 02/04/16  Melony Overly, MD  predniSONE (DELTASONE) 20 MG tablet Take 1 tablet (20 mg total) by mouth daily. 01/21/16   Melony Overly, MD  ranitidine (ZANTAC) 150 MG tablet Take 1 tablet (150 mg total) by mouth 2 (two) times daily. 01/08/16   Katheren Shams, DO  sucralfate (CARAFATE) 1 GM/10ML suspension Take 10 mLs (1 g total) by mouth 4 (four) times daily -  with meals and at bedtime. 01/21/16   Melony Overly, MD    Family History Family History  Problem Relation Age of Onset  . Brain cancer Maternal Aunt   . Depression Mother   . Heart disease Mother   . Depression Son   . Diabetes Maternal Grandmother   . Stroke Maternal Grandmother   . Depression Maternal Grandmother   . Depression Daughter   . Malignant hyperthermia Son 16    tonsils drained    Social History Social History  Substance Use Topics  . Smoking status: Current Some Day Smoker    Packs/day: 0.50    Years: 36.00    Types: Cigarettes    Start date: 03/14/1978  . Smokeless tobacco: Never Used     Comment: starting chantix, hx 1 1/2 PPD(has decrease to 2  cigs per day)  . Alcohol use No     Allergies   Metformin and related; Penicillins; Codeine; Morphine and related; and Tomato   Review of Systems Review of Systems As in history of present illness  Physical Exam Triage Vital Signs ED Triage Vitals  Enc Vitals Group     BP 01/21/16 1808 162/94     Pulse Rate 01/21/16 1808 74     Resp --      Temp 01/21/16 1808 98.2 F (36.8 C)     Temp Source 01/21/16 1808 Oral     SpO2 01/21/16 1808 97 %     Weight --      Height --      Head Circumference --      Peak Flow --      Pain Score 01/21/16 1815 8     Pain Loc --      Pain Edu? --      Excl. in [place]? --    No data found.   Updated Vital Signs BP 162/94 (BP Location: Left Arm)   Pulse 74   Temp 98.2 F (36.8 C) (Oral)   SpO2 97%   Visual Acuity Right Eye Distance:   Left Eye Distance:   Bilateral Distance:    Right Eye Near:   Left Eye Near:    Bilateral Near:     Physical Exam  Constitutional: She is oriented to person, place, and time. She appears well-developed and well-nourished. No distress.  Cardiovascular: Normal rate, regular rhythm and normal heart sounds.   No murmur heard. Pulmonary/Chest: Effort normal and breath sounds normal. No respiratory distress. She has no wheezes. She has no rales. She exhibits tenderness (tender along left costal margin; reproduces her pain).  Abdominal: Soft. She exhibits no distension. There is tenderness (mild in left abdomen). There is no rebound and no guarding.  Neurological: She is alert and oriented to person, place, and time.     UC Treatments / Results  Labs (all labs  ordered are listed, but only abnormal results are displayed) Labs Reviewed - No data to display  EKG  EKG Interpretation None       Radiology No results found.  Procedures Procedures (including critical care time)  Medications Ordered in UC Medications  gi cocktail (Maalox,Lidocaine,Donnatal) (30 mLs Oral Given 01/21/16 1829)      Initial Impression / Assessment and Plan / UC Course  I have reviewed the triage vital signs and the nursing notes.  Pertinent labs & imaging results that were available during my care of the patient were reviewed by me and considered in my medical decision making (see chart for details).  Clinical Course     Her pain is completely resolved with the GI cocktail. She continues to have some discomfort in the chest wall, particularly with deep breaths.  Recommended she take omeprazole once daily and Zantac twice daily. Prescription given for Carafate. We'll treat chest wall pain with prednisone. Discussed with patient that if there is an early infection, it is likely viral and the prednisone will help with that as well.  Final Clinical Impressions(s) / UC Diagnoses   Final diagnoses:  Gastroesophageal reflux disease, esophagitis presence not specified  Costochondritis    New Prescriptions New Prescriptions   PREDNISONE (DELTASONE) 20 MG TABLET    Take 1 tablet (20 mg total) by mouth daily.   SUCRALFATE (CARAFATE) 1 GM/10ML SUSPENSION    Take 10 mLs (1 g total) by mouth 4 (four) times daily -  with meals and at bedtime.     Melony Overly, MD 01/21/16 [phone]

## 2016-01-21 NOTE — Discharge Instructions (Signed)
A lot of your pain is coming from heartburn or reflux. Take the omeprazole daily. Take your ranitidine twice a day. Use the Carafate 4 times a day.  Take prednisone daily for 5 days to help with the chest pain and possible infection.  Follow-up with your regular doctor as needed.

## 2016-01-21 NOTE — ED Notes (Signed)
Spoke to patient she feels better but still hqs pain when taking a deep breath.

## 2016-02-02 ENCOUNTER — Encounter (HOSPITAL_COMMUNITY): Payer: Self-pay | Admitting: *Deleted

## 2016-02-02 ENCOUNTER — Other Ambulatory Visit: Payer: Self-pay | Admitting: Neurological Surgery

## 2016-02-02 ENCOUNTER — Encounter: Payer: Medicare Other | Admitting: Obstetrics and Gynecology

## 2016-02-02 MED ORDER — FENTANYL CITRATE (PF) 100 MCG/2ML IJ SOLN: 25.0000 ug | INTRAMUSCULAR | Status: DC | PRN

## 2016-02-02 MED ORDER — PROMETHAZINE HCL 25 MG/ML IJ SOLN: 6.2500 mg | INTRAMUSCULAR | Status: DC | PRN

## 2016-02-02 NOTE — Progress Notes (Addendum)
Anesthesia Chart Review: SAME DAY WORK-UP. Patient is a 50 year old female scheduled for lumbar spinal cord stimulator insertion on 02/03/16 by Dr. Ronnald Ramp. Surgery was initially scheduled for 01/01/16, but was postponed due to poorly controlled DM2 with A1c > 13. Since then her Lantus was increased from 15 to 20 Units Q AM and metformin XR 500 mg daily started. She reports that her fasting CBGs are now in the high 90's to low 100's.  I evaluated her on 12/30/15 due to unconfirmed family history of malignant hyperthermia (see below). Currently she is posted as a first case.   History includes smoking, HTN, HLD, glaucoma and retinitis pigmentosa, legally blind, DM2, anxiety, GERD, fibromyalgia, asthma, OSA (on CPAP), arthritis, chronic headaches, recurrent [place] lung cancer s/p LUL trisegmentectomy with LN dissection '11 and chemoradiation, right IJ Port-a-cath 02/11/11 s/p removal 11/26/13, hysterectomy, L4-5 microdissection/foramintoimes with removal of synovial cyst 10/27/11, L4-5 PLIF 07/03/13. BMI is 40 consistent with morbid obesity. For anesthesia history, she reports that she is difficult for PIV access and that her son has a history of MALIGNANT HYPERTHERMIA. She denies personal history of MH. Her son's father is now deceased, but she is unaware if he had a history of MH.  In regards to her son's reported history of MH, she says that he was having a tonsillar abscess drained in the OR and was told his fever was very high. He required ice packs and was taken to the ICU intubated. He remained in the ICU for a few days but ultimately had no long-term adverse effects. He or none of his family members have ever undergone any formal testing (ie, muscle biopsy). Event happened approximately 17 years ago, and at the time they were told that if he required additional surgery that he would need to go somewhere like [facility]. Her son is now in the [employer]. I gave her information to the [facility] Anesthesia Department to  inquire about their MH clinic/evaluation.    - HEM-ONC is Dr. Julien Nordmann.  - PCP is listed as Dr. Luiz Blare with [facility], next visit scheduled for 02/02/16 at 2:30 PM. - She is not followed routinely by a cardiologist, but she was evaluated by Dr. Acie Fredrickson on 01/02/13 for palpitations (in the setting of hypokalemia) and abnormal EKG (inferolateral T wave inversion).   Meds include albuterol, amlodipine, ASA, Lipitor, Coreg, Neurontin, Lantus 20 units each morning, Linzess, metformin, Prilosec, Zantac, Carafate, Timoptic ophthalmic, Travatan ophthalmic, valsartan, Chantix.  EKG 12/30/15: NSR, T wave abnormality, consider lateral ischemia. She has multiple comparison EKGs in Epic CH[facility] and/or Muse. She had negative inferolateral T waves when she was seen by cardiology in 2014 and had subsequent testing. Inferior T wave abnormality has been less apparent in subsequent tracings. Lateral T wave abnormality has been fairly consistent, although today's tracings showed T wave inversion in high lateral leads as well, which has not been seen as much (except on on 10/17/11 tracing). She denied SOB and CP at PAT.   Nuclear stress test 01/24/13: Overall Impression: Low risk stress nuclear study with a small, mild, partially reversible defect most likely related to shifting breast attenuation. LV Ejection Fraction: 57%. LV Wall Motion: NL LV Function; NL Wall Motion.   CXR 12/30/15: IMPRESSION: Posttherapy changes LEFT upper hemithorax, stable. No acute abnormalities.  She is for labs on arrival.   In review of previous anesthesia records, desflurane was used 10/27/11 without known complication. (I don't see the family history of MH until entry on 06/25/13.) She did  receive a trigger free induction for her lumbar fusion 07/03/13.   Reviewed above with anesthesiologist Dr. Conrad Weston back on 12/30/15. Unconfirmed family history of malignant hyperthermia. Has had both triggering and non-triggering anesthesia in the  past. Case is posted for general anesthesia. Last case done trigger free. Anticipate trigger free induction if GA is indeed used. I forwarded this information to [facility], CRNA and will relay this information on the anesthesiologists' communication board.   Definitive anesthesia plan following anesthesiologist's evaluation of patient on the morning of surgery.   [facility] Short Stay Center/Anesthesiology Phone [phone] 02/02/2016 12:26 PM

## 2016-02-02 NOTE — Anesthesia Preprocedure Evaluation (Addendum)
Anesthesia Evaluation  Patient identified by MRN, date of birth, ID band Patient awake    Reviewed: Allergy & Precautions, NPO status , Patient's Chart, lab work & pertinent test results, reviewed documented beta blocker date and time   History of Anesthesia Complications (+) MALIGNANT HYPERTHERMIA, Family history of anesthesia reaction and history of anesthetic complications  Airway Mallampati: III  TM Distance: >3 FB Neck ROM: Full    Dental no notable dental hx. (+) Loose,    Pulmonary shortness of breath and with exertion, sleep apnea and Continuous Positive Airway Pressure Ventilation , Current Smoker,  Hx/o lung Ca   Pulmonary exam normal breath sounds clear to auscultation       Cardiovascular hypertension, Pt. on medications and Pt. on home beta blockers Normal cardiovascular exam Rhythm:Regular Rate:Normal     Neuro/Psych  Headaches, PSYCHIATRIC DISORDERS Anxiety Hx/o Glaucoma Hx/o retinitis pigmentosa Legally blind Neuropathic pain    GI/Hepatic GERD  Medicated and Controlled,  Endo/Other  diabetes, Well Controlled, Type 2, Insulin Dependent, Oral Hypoglycemic AgentsHyperlipidemia  Renal/GU Renal disease  negative genitourinary   Musculoskeletal  (+) Arthritis , Osteoarthritis,  Fibromyalgia -Failed back surgery Chronic low back pain S/P lumbar laminectomy and fusion Chronic eczema   Abdominal (+) + obese,   Peds  Hematology   Anesthesia Other Findings   Reproductive/Obstetrics                              Chemistry      Component Value Date/Time   NA 132 (L) 12/30/2015 1015   NA 139 02/06/2015 0901   K 4.1 12/30/2015 1015   K 3.9 02/06/2015 0901   CL 98 (L) 12/30/2015 1015   CL 102 01/18/2012 0805   CO2 25 12/30/2015 1015   CO2 28 02/06/2015 0901   BUN 9 12/30/2015 1015   BUN 10.8 02/06/2015 0901   CREATININE 1.39 (H) 12/30/2015 1015   CREATININE 1.5 (H) 02/06/2015  0901      Component Value Date/Time   CALCIUM 9.3 12/30/2015 1015   CALCIUM 9.6 02/06/2015 0901   ALKPHOS 60 02/23/2015 1712   ALKPHOS 71 02/06/2015 0901   AST 16 02/23/2015 1712   AST 12 02/06/2015 0901   ALT 17 02/23/2015 1712   ALT 16 02/06/2015 0901   BILITOT 1.5 (H) 02/23/2015 1712   BILITOT 0.87 02/06/2015 0901     Lab Results  Component Value Date   WBC 7.6 12/30/2015   HGB 18.1 (H) 12/30/2015   HCT 52.1 (H) 12/30/2015   MCV 86.3 12/30/2015   PLT 174 12/30/2015   EKG: unchanged from previous tracings, normal sinus rhythm, nonspecific ST and T waves changes, consider lateral ischemia.  Anesthesia Physical Anesthesia Plan  ASA: III  Anesthesia Plan: General   Post-op Pain Management:    Induction: Intravenous, Rapid sequence and Cricoid pressure planned  Airway Management Planned: Oral ETT  Additional Equipment:   Intra-op Plan:   Post-operative Plan: Extubation in OR  Informed Consent: I have reviewed the patients History and Physical, chart, labs and discussed the procedure including the risks, benefits and alternatives for the proposed anesthesia with the patient or authorized representative who has indicated his/her understanding and acceptance.   Dental advisory given  Plan Discussed with: Anesthesiologist, CRNA and Surgeon  Anesthesia Plan Comments:         Anesthesia Quick Evaluation

## 2016-02-02 NOTE — Progress Notes (Deleted)
     Subjective: No chief complaint on file.    HPI: Theresa [name] is a 50 y.o. female presents for well woman/preventative visit and annual GYN examination.  Acute Concerns:  Diabetes:  Most recent A1c 13.3  Sugars - High at home:     Low at home: Taking medications: Side effects: Hypoglycemia (***) On Aspirin On statin Last eye exam: Due Last foot exam: Due Nephropathy screen indicated?:  ROS: denies fever, chills, dizziness, LOC, polyuria, polydipsia, numbness or tingling in extremities or chest pain, visual disturbances.  Hypertension Blood pressure at home: Blood pressure today:  Taking Meds: Side effects: ROS: Denies headache, dizziness, visual changes, nausea, vomiting, chest pain, abdominal pain or shortness of breath.  #CKD  #Lung cancer  # Tobacco Use:   #Obesity  #MDD  ASA dose??  Diet:  Exercise:  Sexual History: Birth history:  LMP:  Birth Control:  POA/Living Will:  Social:  Social History   Social History  . Marital status: Married    Spouse name: N/A  . Number of children: 3  . Years of education: N/A   Social History Main Topics  . Smoking status: Current Some Day Smoker    Packs/day: 0.18    Years: 36.00    Types: Cigarettes    Start date: 03/14/1978  . Smokeless tobacco: Never Used     Comment: starting chantix, hx 1 1/2 PPD(has decrease to 2 cigs per day)  . Alcohol use No  . Drug use: No  . Sexual activity: Yes     Comment: 11/25/13 - smoking 1/2 of 1.   Other Topics Concern  . Not on file   Social History Narrative  . No narrative on file     Immunization:  Tdap/TD: Due  Influenza: Due  Pneumococcal: Due  Cancer Screening:  Pap Smear: Due  Mammogram:  Due  Colonoscopy: Due  ROS reviewed and were negative unless otherwise noted in HPI.    # # #  Health Maintenance: ***    ROS noted in HPI.  Past Medical, Surgical, Social, and Family History Reviewed & Updated per EMR. Smoking status -  ***   Objective: There were no vitals taken for this visit. Vitals and nursing notes reviewed  Physical Exam   No results found for this or any previous visit (from the past 72 hour(s)).  Assessment/Plan: 50 y.o. female presents for annual well woman/preventative exam and GYN exam. Please see problem specific assessment and plan.    Health Maintainance:   No orders of the defined types were placed in this encounter.   No orders of the defined types were placed in this encounter.    Luiz Blare, DO 02/02/2016, 11:45 AM PGY-3, [place]

## 2016-02-02 NOTE — Progress Notes (Signed)
Theresa [name] states that her CBGs have been great- this am it was 52. " It has not been in the 200s at all.  It runs in the low 100's.  Patient reported that she was on Prednisone earlier this month for a lung infection and CBG stayed in the 100's. A1C has not been rechecked. I instructed patient to check CBG to check CBG and if it is less than 70 to treat it with Glucose Gel, Glucose tablets or 1/2 cup of clear juice like apple juice or cranberry juice, or 1/2 cup of regular soda. (not cream soda). I instructed patient to recheck CBG in 15 minutes and if CBG is not greater than 70, to  Call [phone] (pre- op). If it is before pre-op opens to retreat as before and recheck CBG in 15 minutes. I told patient to make note of time that liquid is taken and amount, that surgical time may have to be adjusted.

## 2016-02-03 ENCOUNTER — Encounter (HOSPITAL_COMMUNITY)
Admission: RE | Disposition: A | Discharge status: Home / Self Care | Payer: Self-pay | Source: Ambulatory Visit | Attending: Neurological Surgery

## 2016-02-03 ENCOUNTER — Ambulatory Visit (HOSPITAL_COMMUNITY): Payer: Medicare Other | Admitting: Vascular Surgery

## 2016-02-03 ENCOUNTER — Ambulatory Visit (HOSPITAL_COMMUNITY)
Admission: RE | Admit: 2016-02-03 | Discharge: 2016-02-03 | Disposition: A | Discharge status: Home / Self Care | Payer: Medicare Other | Source: Ambulatory Visit | Attending: Neurological Surgery | Admitting: Neurological Surgery

## 2016-02-03 ENCOUNTER — Encounter (HOSPITAL_COMMUNITY): Payer: Self-pay | Admitting: *Deleted

## 2016-02-03 ENCOUNTER — Ambulatory Visit (HOSPITAL_COMMUNITY): Payer: Medicare Other

## 2016-02-03 DIAGNOSIS — E785 Hyperlipidemia, unspecified: Secondary | ICD-10-CM | POA: Insufficient documentation

## 2016-02-03 DIAGNOSIS — Z7982 Long term (current) use of aspirin: Secondary | ICD-10-CM | POA: Diagnosis not present

## 2016-02-03 DIAGNOSIS — F17210 Nicotine dependence, cigarettes, uncomplicated: Secondary | ICD-10-CM | POA: Insufficient documentation

## 2016-02-03 DIAGNOSIS — M797 Fibromyalgia: Secondary | ICD-10-CM | POA: Diagnosis not present

## 2016-02-03 DIAGNOSIS — H548 Legal blindness, as defined in USA: Secondary | ICD-10-CM | POA: Insufficient documentation

## 2016-02-03 DIAGNOSIS — M961 Postlaminectomy syndrome, not elsewhere classified: Secondary | ICD-10-CM | POA: Insufficient documentation

## 2016-02-03 DIAGNOSIS — K219 Gastro-esophageal reflux disease without esophagitis: Secondary | ICD-10-CM | POA: Insufficient documentation

## 2016-02-03 DIAGNOSIS — G4733 Obstructive sleep apnea (adult) (pediatric): Secondary | ICD-10-CM | POA: Insufficient documentation

## 2016-02-03 DIAGNOSIS — Z85118 Personal history of other malignant neoplasm of bronchus and lung: Secondary | ICD-10-CM | POA: Insufficient documentation

## 2016-02-03 DIAGNOSIS — Z9889 Other specified postprocedural states: Secondary | ICD-10-CM

## 2016-02-03 DIAGNOSIS — Z880 Allergy status to penicillin: Secondary | ICD-10-CM | POA: Insufficient documentation

## 2016-02-03 DIAGNOSIS — Z9689 Presence of other specified functional implants: Secondary | ICD-10-CM

## 2016-02-03 DIAGNOSIS — Z794 Long term (current) use of insulin: Secondary | ICD-10-CM | POA: Diagnosis not present

## 2016-02-03 DIAGNOSIS — Z419 Encounter for procedure for purposes other than remedying health state, unspecified: Secondary | ICD-10-CM

## 2016-02-03 DIAGNOSIS — Z6841 Body Mass Index (BMI) 40.0 and over, adult: Secondary | ICD-10-CM | POA: Insufficient documentation

## 2016-02-03 DIAGNOSIS — I10 Essential (primary) hypertension: Secondary | ICD-10-CM | POA: Diagnosis not present

## 2016-02-03 DIAGNOSIS — F419 Anxiety disorder, unspecified: Secondary | ICD-10-CM | POA: Diagnosis not present

## 2016-02-03 DIAGNOSIS — Z9221 Personal history of antineoplastic chemotherapy: Secondary | ICD-10-CM | POA: Diagnosis not present

## 2016-02-03 DIAGNOSIS — Z8489 Family history of other specified conditions: Secondary | ICD-10-CM | POA: Diagnosis not present

## 2016-02-03 DIAGNOSIS — E119 Type 2 diabetes mellitus without complications: Secondary | ICD-10-CM | POA: Diagnosis not present

## 2016-02-03 DIAGNOSIS — Z923 Personal history of irradiation: Secondary | ICD-10-CM | POA: Diagnosis not present

## 2016-02-03 HISTORY — PX: SPINAL CORD STIMULATOR INSERTION: SHX5378

## 2016-02-03 LAB — CBC
HCT: 50.1 % — ABNORMAL HIGH (ref 36.0–46.0)
Hemoglobin: 16.9 g/dL — ABNORMAL HIGH (ref 12.0–15.0)
MCH: 29.4 pg (ref 26.0–34.0)
MCHC: 33.7 g/dL (ref 30.0–36.0)
MCV: 87.3 fL (ref 78.0–100.0)
Platelets: 202 10*3/uL (ref 150–400)
RBC: 5.74 MIL/uL — ABNORMAL HIGH (ref 3.87–5.11)
RDW: 12.6 % (ref 11.5–15.5)
WBC: 10.9 10*3/uL — ABNORMAL HIGH (ref 4.0–10.5)

## 2016-02-03 LAB — BASIC METABOLIC PANEL
Anion gap: 11 (ref 5–15)
BUN: 11 mg/dL (ref 6–20)
CO2: 23 mmol/L (ref 22–32)
Calcium: 9.2 mg/dL (ref 8.9–10.3)
Chloride: 104 mmol/L (ref 101–111)
Creatinine, Ser: 1.34 mg/dL — ABNORMAL HIGH (ref 0.44–1.00)
GFR calc Af Amer: 52 mL/min — ABNORMAL LOW (ref >60)
GFR calc non Af Amer: 45 mL/min — ABNORMAL LOW (ref >60)
Glucose, Bld: 187 mg/dL — ABNORMAL HIGH (ref 65–99)
Potassium: 3.4 mmol/L — ABNORMAL LOW (ref 3.5–5.1)
Sodium: 138 mmol/L (ref 135–145)

## 2016-02-03 LAB — GLUCOSE, CAPILLARY
Glucose-Capillary: 199 mg/dL — ABNORMAL HIGH (ref 65–99)
Glucose-Capillary: 154 mg/dL — ABNORMAL HIGH (ref 65–99)

## 2016-02-03 LAB — SURGICAL PCR SCREEN
MRSA, PCR: NEGATIVE
Staphylococcus aureus: NEGATIVE

## 2016-02-03 SURGERY — INSERTION, SPINAL CORD STIMULATOR, LUMBAR
Anesthesia: General | Site: Back

## 2016-02-03 MED ORDER — BUPIVACAINE HCL (PF) 0.25 % IJ SOLN
INTRAMUSCULAR | Status: AC
  Filled 2016-02-03: qty 30

## 2016-02-03 MED ORDER — THROMBIN 5000 UNITS EX SOLR
CUTANEOUS | Status: AC
  Filled 2016-02-03: qty 15000

## 2016-02-03 MED ORDER — PANTOPRAZOLE SODIUM 40 MG PO TBEC: 40.0000 mg | DELAYED_RELEASE_TABLET | Freq: Every day | ORAL | Status: DC

## 2016-02-03 MED ORDER — AMLODIPINE BESYLATE 10 MG PO TABS: 10.0000 mg | ORAL_TABLET | Freq: Every day | ORAL | Status: DC

## 2016-02-03 MED ORDER — VARENICLINE TARTRATE 1 MG PO TABS
1.0000 mg | ORAL_TABLET | Freq: Two times a day (BID) | ORAL | Status: DC
Start: 2016-02-03 — End: 2016-02-03
  Filled 2016-02-03: qty 1

## 2016-02-03 MED ORDER — FENTANYL CITRATE (PF) 100 MCG/2ML IJ SOLN
INTRAMUSCULAR | Status: AC
  Filled 2016-02-03: qty 2

## 2016-02-03 MED ORDER — PROPOFOL 10 MG/ML IV BOLUS
INTRAVENOUS | Status: DC | PRN
Start: 2016-02-03 — End: 2016-02-03
  Administered 2016-02-03: 120 mg via INTRAVENOUS

## 2016-02-03 MED ORDER — HEMOSTATIC AGENTS (NO CHARGE) OPTIME
TOPICAL | Status: DC | PRN
  Administered 2016-02-03: 1 via TOPICAL

## 2016-02-03 MED ORDER — GELATIN ABSORBABLE MT POWD
OROMUCOSAL | Status: DC | PRN
  Administered 2016-02-03: 10:00:00 via TOPICAL

## 2016-02-03 MED ORDER — OXYCODONE-ACETAMINOPHEN 5-325 MG PO TABS
ORAL_TABLET | ORAL | Status: AC
  Filled 2016-02-03: qty 2

## 2016-02-03 MED ORDER — TIMOLOL MALEATE 0.5 % OP SOLN
1.0000 [drp] | Freq: Two times a day (BID) | OPHTHALMIC | Status: DC
  Filled 2016-02-03: qty 5

## 2016-02-03 MED ORDER — PHENYLEPHRINE HCL 10 MG/ML IJ SOLN
INTRAVENOUS | Status: DC | PRN
  Administered 2016-02-03: 40 ug/min via INTRAVENOUS

## 2016-02-03 MED ORDER — MIDAZOLAM HCL 5 MG/5ML IJ SOLN
INTRAMUSCULAR | Status: DC | PRN
  Administered 2016-02-03: 2 mg via INTRAVENOUS

## 2016-02-03 MED ORDER — SODIUM CHLORIDE 0.9 % IV SOLN: 250.0000 mL | INTRAVENOUS | Status: DC

## 2016-02-03 MED ORDER — LATANOPROST 0.005 % OP SOLN
1.0000 [drp] | Freq: Every day | OPHTHALMIC | Status: DC
  Filled 2016-02-03: qty 2.5

## 2016-02-03 MED ORDER — MEPERIDINE HCL 25 MG/ML IJ SOLN: 6.2500 mg | INTRAMUSCULAR | Status: DC | PRN

## 2016-02-03 MED ORDER — ROCURONIUM BROMIDE 10 MG/ML (PF) SYRINGE
PREFILLED_SYRINGE | INTRAVENOUS | Status: AC
  Filled 2016-02-03: qty 10

## 2016-02-03 MED ORDER — MUPIROCIN 2 % EX OINT
TOPICAL_OINTMENT | Freq: Once | CUTANEOUS | Status: AC
  Administered 2016-02-03: 1 via NASAL

## 2016-02-03 MED ORDER — SODIUM CHLORIDE 0.9% FLUSH: 3.0000 mL | INTRAVENOUS | Status: DC | PRN

## 2016-02-03 MED ORDER — EPHEDRINE SULFATE 50 MG/ML IJ SOLN
INTRAMUSCULAR | Status: DC | PRN
  Administered 2016-02-03: 10 mg via INTRAVENOUS
  Administered 2016-02-03 (×3): 5 mg via INTRAVENOUS

## 2016-02-03 MED ORDER — ONDANSETRON HCL 4 MG/2ML IJ SOLN
INTRAMUSCULAR | Status: DC | PRN
  Administered 2016-02-03: 4 mg via INTRAVENOUS

## 2016-02-03 MED ORDER — SODIUM CHLORIDE 0.9 % IR SOLN
Status: DC | PRN
  Administered 2016-02-03: 10:00:00

## 2016-02-03 MED ORDER — VANCOMYCIN HCL 10 G IV SOLR
1500.0000 mg | INTRAVENOUS | Status: AC
  Administered 2016-02-03: 1.5 g via INTRAVENOUS
  Filled 2016-02-03: qty 1500

## 2016-02-03 MED ORDER — IRBESARTAN 75 MG PO TABS: 37.5000 mg | ORAL_TABLET | Freq: Every day | ORAL | Status: DC

## 2016-02-03 MED ORDER — SODIUM CHLORIDE 0.9 % IV SOLN
0.0500 ug/kg/min | INTRAVENOUS | Status: AC
  Administered 2016-02-03: 0.1 ug/kg/min via INTRAVENOUS
  Filled 2016-02-03: qty 5000

## 2016-02-03 MED ORDER — PHENYLEPHRINE 40 MCG/ML (10ML) SYRINGE FOR IV PUSH (FOR BLOOD PRESSURE SUPPORT)
PREFILLED_SYRINGE | INTRAVENOUS | Status: AC
  Filled 2016-02-03: qty 10

## 2016-02-03 MED ORDER — MENTHOL 3 MG MT LOZG: 1.0000 | LOZENGE | OROMUCOSAL | Status: DC | PRN

## 2016-02-03 MED ORDER — PROPOFOL 10 MG/ML IV BOLUS
INTRAVENOUS | Status: AC
  Filled 2016-02-03: qty 20

## 2016-02-03 MED ORDER — ALBUTEROL SULFATE (2.5 MG/3ML) 0.083% IN NEBU: 3.0000 mL | INHALATION_SOLUTION | RESPIRATORY_TRACT | Status: DC | PRN

## 2016-02-03 MED ORDER — SUCRALFATE 1 GM/10ML PO SUSP
1.0000 g | Freq: Three times a day (TID) | ORAL | Status: DC
  Filled 2016-02-03 (×2): qty 10

## 2016-02-03 MED ORDER — INSULIN ASPART 100 UNIT/ML ~~LOC~~ SOLN: 0.0000 [IU] | Freq: Three times a day (TID) | SUBCUTANEOUS | Status: DC

## 2016-02-03 MED ORDER — ONDANSETRON HCL 4 MG/2ML IJ SOLN: 4.0000 mg | INTRAMUSCULAR | Status: DC | PRN

## 2016-02-03 MED ORDER — MORPHINE SULFATE (PF) 4 MG/ML IV SOLN: 1.0000 mg | INTRAVENOUS | Status: DC | PRN

## 2016-02-03 MED ORDER — PHENOL 1.4 % MT LIQD
1.0000 | OROMUCOSAL | Status: DC | PRN
Start: 2016-02-03 — End: 2016-02-03

## 2016-02-03 MED ORDER — HYDROCODONE-ACETAMINOPHEN 7.5-325 MG PO TABS: 1.0000 | ORAL_TABLET | Freq: Once | ORAL | Status: DC | PRN

## 2016-02-03 MED ORDER — 0.9 % SODIUM CHLORIDE (POUR BTL) OPTIME
TOPICAL | Status: DC | PRN
  Administered 2016-02-03: 1000 mL

## 2016-02-03 MED ORDER — VANCOMYCIN HCL 1000 MG IV SOLR
INTRAVENOUS | Status: AC
  Filled 2016-02-03: qty 1000

## 2016-02-03 MED ORDER — PROPOFOL 500 MG/50ML IV EMUL
INTRAVENOUS | Status: DC | PRN
  Administered 2016-02-03: 150 ug/kg/min via INTRAVENOUS
  Administered 2016-02-03: 10:00:00 via INTRAVENOUS

## 2016-02-03 MED ORDER — SODIUM CHLORIDE 0.9% FLUSH: 3.0000 mL | Freq: Two times a day (BID) | INTRAVENOUS | Status: DC

## 2016-02-03 MED ORDER — FENTANYL CITRATE (PF) 100 MCG/2ML IJ SOLN
INTRAMUSCULAR | Status: DC | PRN
  Administered 2016-02-03: 50 ug via INTRAVENOUS

## 2016-02-03 MED ORDER — VANCOMYCIN HCL IN DEXTROSE 1-5 GM/200ML-% IV SOLN: 1000.0000 mg | Freq: Once | INTRAVENOUS | Status: DC

## 2016-02-03 MED ORDER — LINACLOTIDE 290 MCG PO CAPS: 290.0000 ug | ORAL_CAPSULE | Freq: Every day | ORAL | Status: DC

## 2016-02-03 MED ORDER — PHENYLEPHRINE HCL 10 MG/ML IJ SOLN
INTRAMUSCULAR | Status: DC | PRN
  Administered 2016-02-03: 80 ug via INTRAVENOUS
  Administered 2016-02-03: 40 ug via INTRAVENOUS
  Administered 2016-02-03: 120 ug via INTRAVENOUS
  Administered 2016-02-03: 40 ug via INTRAVENOUS

## 2016-02-03 MED ORDER — ACETAMINOPHEN 650 MG RE SUPP: 650.0000 mg | RECTAL | Status: DC | PRN

## 2016-02-03 MED ORDER — EPHEDRINE 5 MG/ML INJ
INTRAVENOUS | Status: AC
  Filled 2016-02-03: qty 10

## 2016-02-03 MED ORDER — FENTANYL CITRATE (PF) 100 MCG/2ML IJ SOLN
25.0000 ug | INTRAMUSCULAR | Status: DC | PRN
  Administered 2016-02-03: 50 ug via INTRAVENOUS
  Administered 2016-02-03 (×2): 25 ug via INTRAVENOUS
  Administered 2016-02-03: 50 ug via INTRAVENOUS

## 2016-02-03 MED ORDER — BUPIVACAINE HCL (PF) 0.25 % IJ SOLN
INTRAMUSCULAR | Status: DC | PRN
  Administered 2016-02-03: 2 mL

## 2016-02-03 MED ORDER — ROCURONIUM BROMIDE 100 MG/10ML IV SOLN
INTRAVENOUS | Status: DC | PRN
  Administered 2016-02-03: 20 mg via INTRAVENOUS
  Administered 2016-02-03: 40 mg via INTRAVENOUS

## 2016-02-03 MED ORDER — SUGAMMADEX SODIUM 200 MG/2ML IV SOLN
INTRAVENOUS | Status: DC | PRN
  Administered 2016-02-03: 200 mg via INTRAVENOUS

## 2016-02-03 MED ORDER — LIDOCAINE 2% (20 MG/ML) 5 ML SYRINGE
INTRAMUSCULAR | Status: AC
  Filled 2016-02-03: qty 5

## 2016-02-03 MED ORDER — GABAPENTIN 300 MG PO CAPS: 300.0000 mg | ORAL_CAPSULE | Freq: Three times a day (TID) | ORAL | Status: DC

## 2016-02-03 MED ORDER — METOCLOPRAMIDE HCL 5 MG/ML IJ SOLN: 10.0000 mg | Freq: Once | INTRAMUSCULAR | Status: DC | PRN

## 2016-02-03 MED ORDER — MUPIROCIN 2 % EX OINT
TOPICAL_OINTMENT | Freq: Two times a day (BID) | CUTANEOUS | Status: DC
  Filled 2016-02-03 (×2): qty 22

## 2016-02-03 MED ORDER — MIDAZOLAM HCL 2 MG/2ML IJ SOLN
INTRAMUSCULAR | Status: AC
  Filled 2016-02-03: qty 2

## 2016-02-03 MED ORDER — OXYCODONE-ACETAMINOPHEN 5-325 MG PO TABS
1.0000 | ORAL_TABLET | ORAL | Status: DC | PRN
  Administered 2016-02-03: 2 via ORAL

## 2016-02-03 MED ORDER — CARVEDILOL 6.25 MG PO TABS: 25.0000 mg | ORAL_TABLET | Freq: Two times a day (BID) | ORAL | Status: DC

## 2016-02-03 MED ORDER — ONDANSETRON HCL 4 MG/2ML IJ SOLN
INTRAMUSCULAR | Status: AC
  Filled 2016-02-03: qty 2

## 2016-02-03 MED ORDER — THROMBIN 5000 UNITS EX SOLR
CUTANEOUS | Status: DC | PRN
  Administered 2016-02-03 (×2): 5000 [IU] via TOPICAL

## 2016-02-03 MED ORDER — VANCOMYCIN HCL 1000 MG IV SOLR
INTRAVENOUS | Status: DC | PRN
  Administered 2016-02-03: 1000 mg

## 2016-02-03 MED ORDER — METFORMIN HCL ER 500 MG PO TB24: 500.0000 mg | ORAL_TABLET | Freq: Every day | ORAL | Status: DC

## 2016-02-03 MED ORDER — LIDOCAINE HCL (CARDIAC) 20 MG/ML IV SOLN
INTRAVENOUS | Status: DC | PRN
  Administered 2016-02-03: 80 mg via INTRAVENOUS

## 2016-02-03 MED ORDER — LACTATED RINGERS IV SOLN
INTRAVENOUS | Status: DC | PRN
  Administered 2016-02-03: 08:00:00 via INTRAVENOUS

## 2016-02-03 MED ORDER — ACETAMINOPHEN 325 MG PO TABS: 650.0000 mg | ORAL_TABLET | ORAL | Status: DC | PRN

## 2016-02-03 MED ORDER — POTASSIUM CHLORIDE IN NACL 20-0.9 MEQ/L-% IV SOLN
INTRAVENOUS | Status: DC
Start: 2016-02-03 — End: 2016-02-03

## 2016-02-03 SURGICAL SUPPLY — 54 items
APL SKNCLS STERI-STRIP NONHPOA (GAUZE/BANDAGES/DRESSINGS) ×1
BAG DECANTER FOR FLEXI CONT (MISCELLANEOUS) ×2 IMPLANT
BENZOIN TINCTURE PRP APPL 2/3 (GAUZE/BANDAGES/DRESSINGS) ×2 IMPLANT
BUR MATCHSTICK NEURO 3.0 LAGG (BURR) ×2 IMPLANT
CANISTER SUCT 3000ML PPV (MISCELLANEOUS) ×2 IMPLANT
CARTRIDGE OIL MAESTRO DRILL (MISCELLANEOUS) ×1 IMPLANT
DIFFUSER DRILL AIR PNEUMATIC (MISCELLANEOUS) ×2 IMPLANT
DRAPE C-ARM 42X72 X-RAY (DRAPES) ×4 IMPLANT
DRAPE LAPAROTOMY 100X72X124 (DRAPES) ×2 IMPLANT
DRAPE POUCH INSTRU U-SHP 10X18 (DRAPES) ×2 IMPLANT
DRAPE SURG 17X23 STRL (DRAPES) ×2 IMPLANT
DRSG OPSITE POSTOP 3X4 (GAUZE/BANDAGES/DRESSINGS) ×1 IMPLANT
DRSG OPSITE POSTOP 4X6 (GAUZE/BANDAGES/DRESSINGS) ×1 IMPLANT
DURAPREP 26ML APPLICATOR (WOUND CARE) ×2 IMPLANT
ELECT REM PT RETURN 9FT ADLT (ELECTROSURGICAL) ×2
ELECTRODE REM PT RTRN 9FT ADLT (ELECTROSURGICAL) ×1 IMPLANT
ELEVATER PASSER (SPINAL CORD STIMULATOR) ×2
GAUZE SPONGE 4X4 16PLY XRAY LF (GAUZE/BANDAGES/DRESSINGS) IMPLANT
GLOVE BIO SURGEON STRL SZ8 (GLOVE) ×2 IMPLANT
GOWN STRL REUS W/ TWL LRG LVL3 (GOWN DISPOSABLE) IMPLANT
GOWN STRL REUS W/ TWL XL LVL3 (GOWN DISPOSABLE) ×1 IMPLANT
GOWN STRL REUS W/TWL 2XL LVL3 (GOWN DISPOSABLE) IMPLANT
GOWN STRL REUS W/TWL LRG LVL3 (GOWN DISPOSABLE)
GOWN STRL REUS W/TWL XL LVL3 (GOWN DISPOSABLE) ×2
HEMOSTAT POWDER KIT SURGIFOAM (HEMOSTASIS) ×1 IMPLANT
IPG PRECISION SPECTRA (Stimulator) ×1 IMPLANT
KIT BASIN OR (CUSTOM PROCEDURE TRAY) ×2 IMPLANT
KIT CHARGING (KITS) ×1
KIT CHARGING PRECISION NEURO (KITS) IMPLANT
KIT PAT PROGRAM FREELINK (KITS) IMPLANT
KIT ROOM TURNOVER OR (KITS) ×2 IMPLANT
LEAD COVER EDGE 50CM STIM KIT (Lead) ×1 IMPLANT
MARKER SKIN DUAL TIP RULER LAB (MISCELLANEOUS) ×1 IMPLANT
NEEDLE HYPO 25X1 1.5 SAFETY (NEEDLE) ×2 IMPLANT
NEEDLE SPNL 20GX3.5 QUINCKE YW (NEEDLE) ×2 IMPLANT
NS IRRIG 1000ML POUR BTL (IV SOLUTION) ×2 IMPLANT
OIL CARTRIDGE MAESTRO DRILL (MISCELLANEOUS) ×2
PACK LAMINECTOMY NEURO (CUSTOM PROCEDURE TRAY) ×2 IMPLANT
PAD ARMBOARD 7.5X6 YLW CONV (MISCELLANEOUS) ×6 IMPLANT
PADDLE BLANK SIMULATOR LEAD 32 (MISCELLANEOUS) ×1 IMPLANT
PASSER ELEVATOR (SPINAL CORD STIMULATOR) ×1 IMPLANT
REMOTE CONTROL KIT (KITS) ×2
RUBBERBAND STERILE (MISCELLANEOUS) IMPLANT
SPONGE SURGIFOAM ABS GEL SZ50 (HEMOSTASIS) ×2 IMPLANT
STRIP CLOSURE SKIN 1/2X4 (GAUZE/BANDAGES/DRESSINGS) ×3 IMPLANT
SUT SILK 3 0 SH 30 (SUTURE) ×2 IMPLANT
SUT VIC AB 0 CT1 18XCR BRD8 (SUTURE) ×1 IMPLANT
SUT VIC AB 0 CT1 8-18 (SUTURE) ×2
SUT VIC AB 2-0 CP2 18 (SUTURE) ×3 IMPLANT
SUT VIC AB 3-0 SH 8-18 (SUTURE) ×3 IMPLANT
TOOL LONG TUNNEL (SPINAL CORD STIMULATOR) ×1 IMPLANT
TOWEL OR 17X24 6PK STRL BLUE (TOWEL DISPOSABLE) ×2 IMPLANT
TOWEL OR 17X26 10 PK STRL BLUE (TOWEL DISPOSABLE) ×2 IMPLANT
WATER STERILE IRR 1000ML POUR (IV SOLUTION) ×2 IMPLANT

## 2016-02-03 NOTE — Discharge Summary (Signed)
Physician Discharge Summary  Patient ID: Theresa [name] MRN: [id number] DOB/AGE: February 13, 1966 50 y.o.  Admit date: 02/03/2016 Discharge date: 02/03/2016  Admission Diagnoses: failed back surgery syndrome    Discharge Diagnoses: same   Discharged Condition: good  Hospital Course: The patient was admitted on 02/03/2016 and taken to the operating room where the patient underwent spinal cord stimulator placement. The patient tolerated the procedure well and was taken to the recovery room and then to the floor in stable condition. The hospital course was routine. There were no complications. The wound remained clean dry and intact. Pt had appropriate back soreness. No complaints of leg pain or new N/T/W. The patient remained afebrile with stable vital signs, and tolerated a regular diet. The patient continued to increase activities, and pain was well controlled with oral pain medications.   Consults: None  Significant Diagnostic Studies:  Results for orders placed or performed during the hospital encounter of 02/03/16  Surgical pcr screen  Result Value Ref Range   MRSA, PCR NEGATIVE NEGATIVE   Staphylococcus aureus NEGATIVE NEGATIVE  Basic metabolic panel  Result Value Ref Range   Sodium 138 135 - 145 mmol/L   Potassium 3.4 (L) 3.5 - 5.1 mmol/L   Chloride 104 101 - 111 mmol/L   CO2 23 22 - 32 mmol/L   Glucose, Bld 187 (H) 65 - 99 mg/dL   BUN 11 6 - 20 mg/dL   Creatinine, Ser 1.34 (H) 0.44 - 1.00 mg/dL   Calcium 9.2 8.9 - 10.3 mg/dL   GFR calc non Af Amer 45 (L) >60 mL/min   GFR calc Af Amer 52 (L) >60 mL/min   Anion gap 11 5 - 15  CBC  Result Value Ref Range   WBC 10.9 (H) 4.0 - 10.5 K/uL   RBC 5.74 (H) 3.87 - 5.11 MIL/uL   Hemoglobin 16.9 (H) 12.0 - 15.0 g/dL   HCT 50.1 (H) 36.0 - 46.0 %   MCV 87.3 78.0 - 100.0 fL   MCH 29.4 26.0 - 34.0 pg   MCHC 33.7 30.0 - 36.0 g/dL   RDW 12.6 11.5 - 15.5 %   Platelets 202 150 - 400 K/uL  Glucose, capillary  Result Value Ref Range   Glucose-Capillary 199 (H) 65 - 99 mg/dL   Comment 1 Notify RN    Comment 2 Document in Chart   Glucose, capillary  Result Value Ref Range   Glucose-Capillary 154 (H) 65 - 99 mg/dL   Comment 1 Notify RN    Comment 2 Document in Chart     Dg Thoracic Spine 1 View  Result Date: 02/03/2016 CLINICAL DATA:  Spinal cord stimulator insertion EXAM: OPERATIVE THORACIC SPINE single VIEW() COMPARISON:  None. FINDINGS: Single intraoperative view of thoracic spine submitted. Four wires spinal cord stimulator are noted in the region of lower thoracic spine. Fluoroscopy time was 25 seconds.  Please see the operative report. IMPRESSION: 4 wires spinal cord stimulator leads are noted in the region of lower thoracic spine. Please see the operative report. Electronically Signed   By: Lahoma Crocker M.D.   On: 02/03/2016 10:34   Dg C-arm 61-120 Min  Result Date: 02/03/2016 CLINICAL DATA:  Spinal cord stimulator insertion EXAM: OPERATIVE THORACIC SPINE single VIEW() COMPARISON:  None. FINDINGS: Single intraoperative view of thoracic spine submitted. Four wires spinal cord stimulator are noted in the region of lower thoracic spine. Fluoroscopy time was 25 seconds.  Please see the operative report. IMPRESSION: 4 wires spinal cord stimulator leads are  noted in the region of lower thoracic spine. Please see the operative report. Electronically Signed   By: Lahoma Crocker M.D.   On: 02/03/2016 10:34    Antibiotics:  Anti-infectives    Start     Dose/Rate Route Frequency Ordered Stop   02/03/16 2130  vancomycin (VANCOCIN) IVPB 1000 mg/200 mL premix     1,000 mg 200 mL/hr over 60 Minutes Intravenous  Once 02/03/16 1407     02/03/16 1023  vancomycin (VANCOCIN) powder  Status:  Discontinued       As needed 02/03/16 1023 02/03/16 1059   02/03/16 0935  bacitracin 50,000 Units in sodium chloride irrigation 0.9 % 500 mL irrigation  Status:  Discontinued       As needed 02/03/16 0935 02/03/16 1059   02/03/16 0930  vancomycin  (VANCOCIN) 1,500 mg in sodium chloride 0.9 % 500 mL IVPB     1,500 mg 250 mL/hr over 120 Minutes Intravenous To Surgery 02/03/16 0927 02/03/16 1032      Discharge Exam: Blood pressure 108/70, pulse 68, temperature 98.5 F (36.9 C), resp. rate 16, height '5\' 2"'$  (1.575 m), weight 100.2 kg (221 lb), SpO2 98 %. Neurologic: Grossly normal Dressings dry  Discharge Medications:     Medication List    TAKE these medications   albuterol 108 (90 Base) MCG/ACT inhaler Commonly known as:  PROVENTIL HFA;VENTOLIN HFA Inhale 2 puffs into the lungs every 4 (four) hours as needed for wheezing or shortness of breath.   amLODipine 10 MG tablet Commonly known as:  NORVASC TAKE 1 TABLET BY MOUTH EVERY DAY, FOLLOW UP WITH PCP FOR REFILLS   aspirin 325 MG tablet Take 650 mg by mouth daily.   aspirin EC 81 MG tablet Take 1 tablet (81 mg total) by mouth daily.   atorvastatin 40 MG tablet Commonly known as:  LIPITOR Take 1 tablet (40 mg total) by mouth daily.   carvedilol 25 MG tablet Commonly known as:  COREG Take 1 tablet (25 mg total) by mouth 2 (two) times daily with a meal. Need PCP follow-up for further refills.   clobetasol cream 0.05 % Commonly known as:  TEMOVATE Apply 1 application topically 3 (three) times daily.   gabapentin 300 MG capsule Commonly known as:  NEURONTIN TAKE 1 CAPSULE BY MOUTH THREE TIMES DAILY   Insulin Glargine 100 UNIT/ML Solostar Pen Commonly known as:  LANTUS SOLOSTAR INJECT 20 UNITS EVERY MORNING   linaclotide 290 MCG Caps capsule Commonly known as:  LINZESS TAKE 1 CAPSULE (290 MCG TOTAL) BY MOUTH DAILY.   metFORMIN 500 MG 24 hr tablet Commonly known as:  GLUCOPHAGE-XR Take 1 tablet (500 mg total) by mouth daily with breakfast.   omeprazole 20 MG capsule Commonly known as:  PRILOSEC Take 1 capsule (20 mg total) by mouth daily.   Pen Needles 32G X 4 MM Misc 1 Stick by Does not apply route daily.   ranitidine 150 MG tablet Commonly known as:   ZANTAC Take 1 tablet (150 mg total) by mouth 2 (two) times daily.   sucralfate 1 GM/10ML suspension Commonly known as:  CARAFATE Take 10 mLs (1 g total) by mouth 4 (four) times daily -  with meals and at bedtime.   timolol 0.5 % ophthalmic solution Commonly known as:  TIMOPTIC Place 1 drop into both eyes 2 (two) times daily.   Travoprost (BAK Free) 0.004 % Soln ophthalmic solution Commonly known as:  TRAVATAN Place 1 drop into both eyes at bedtime.   valsartan  160 MG tablet Commonly known as:  DIOVAN TAKE 1 TABLET (160 MG TOTAL) BY MOUTH DAILY.   varenicline 1 MG tablet Commonly known as:  CHANTIX Take 1 tablet (1 mg total) by mouth 2 (two) times daily.       Disposition: home   Final Dx: spinal cord stimulator replacement  Discharge Instructions     Remove dressing in 72 hours    Complete by:  As directed    Call MD for:  difficulty breathing, headache or visual disturbances    Complete by:  As directed    Call MD for:  persistant nausea and vomiting    Complete by:  As directed    Call MD for:  redness, tenderness, or signs of infection (pain, swelling, redness, odor or green/yellow discharge around incision site)    Complete by:  As directed    Call MD for:  severe uncontrolled pain    Complete by:  As directed    Call MD for:  temperature >100.4    Complete by:  As directed    Diet - low sodium heart healthy    Complete by:  As directed    Increase activity slowly    Complete by:  As directed       Follow-up Information    Jewels Langone S, MD Follow up.   Specialty:  Neurosurgery Contact information: [address] [place] [address] [place] [address] [phone]            Signed: Eustace Moore 02/03/2016, 3:54 PM

## 2016-02-03 NOTE — H&P (Signed)
Subjective: Patient is a 50 y.o. female admitted for SCS. Onset of symptoms was several years ago, gradually worsening since that time.  The pain is rated severe, and is located at the across the lower back and radiates to legs. The pain is described as aching and occurs all day. The symptoms have been progressive. Symptoms are exacerbated by nothing in particular.   Past Medical History:  Diagnosis Date  . Anxiety   . Arthritis   . Blindness, legal    glaucoma and retinitis  pigmentosa  . Chronic eczema   . Chronic headaches 10-17-11   not migraines- just regular  . Constipation   . Depression   . Diabetes mellitus type II    diet   . Family history of anesthesia complication    1 son had malignant hyperthermia at 89yr  tonsils rained  . Family history of malignant hyperthermia    1 son 16 years ago  . Fibromyalgia    denies, has not been diagnosited  . GERD (gastroesophageal reflux disease)   . Glaucoma   . History of chemotherapy 01/2011 to 03/2011   concurrent w/radiation therapy  . Hx of radiation therapy 01-17-11 to 03-18-11   lung  . Hyperlipidemia   . Hypertension   . Lung cancer (HEdna    lung ca dx 2010  . Malignant hyperthermia    As of 12/30/15, no personal MH history, but reported her son has a history of MH ~ 2000 (no confirmatory testing done)  . Onychomycosis   . Retinitis pigmentosa   . Shortness of breath    with exertion  . Sleep apnea    cpap 5 yrs    Past Surgical History:  Procedure Laterality Date  . ABDOMINAL HYSTERECTOMY  09/19/2001   hx. of fibroids. TAH. Ovaries remain.   .Marland KitchenBACK SURGERY  06/2013  . CHEST TUBE INSERTION   04/01/08   L hydropneumothorax  . EYE SURGERY Bilateral    lens implant  . KNEE ARTHROSCOPY  10-17-11   bil. knee scope(left was torn ligament)  . LUMBAR LAMINECTOMY/DECOMPRESSION MICRODISCECTOMY  10/27/2011   Procedure: LUMBAR LAMINECTOMY/DECOMPRESSION MICRODISCECTOMY;  Surgeon: JJohnn Hai MD;  Location: [facility];  Service:  Orthopedics;  Laterality: N/A;  L4-L5  . lung surgery  03/24/08   L vats, L thoracotomy and LUL trisegmentectomy with node dissection  . MAXIMUM ACCESS (MAS)POSTERIOR LUMBAR INTERBODY FUSION (PLIF) 1 LEVEL N/A 07/03/2013   Procedure: FOR MAXIMUM ACCESS (MAS) POSTERIOR LUMBAR INTERBODY FUSION (PLIF) 1 LEVEL;  Surgeon: DEustace Moore MD;  [facility];  Service: Neurosurgery;  Laterality: N/A;  FOR MAXIMUM ACCESS (MAS) POSTERIOR LUMBAR INTERBODY FUSION (PLIF) 1 LEVEL (L4-L5)  . PORT-A-CATH REMOVAL Right 11/26/2013   Procedure: REMOVAL PORT-A-CATH;  Surgeon: BGaye Pollack MD;  [facility]  Service: Thoracic;  Laterality: Right;  . PORTACATH PLACEMENT  02/11/2011   Procedure: INSERTION PORT-A-CATH;  Surgeon: DPierre Bali MD;  [facility]  Service: Thoracic;  Laterality: Right;  9.6Fr. Pre-attached Power Port in Right Internal Jugular  -right chest-remains inplace 10-17-11    Prior to Admission medications   Medication Sig Start Date End Date Taking? Authorizing Provider  albuterol (PROVENTIL HFA;VENTOLIN HFA) 108 (90 Base) MCG/ACT inhaler Inhale 2 puffs into the lungs every 4 (four) hours as needed for wheezing or shortness of breath. 01/19/16  Yes JKatheren Shams DO  amLODipine (NORVASC) 10 MG tablet TAKE 1 TABLET BY MOUTH EVERY DAY, FOLLOW UP WITH PCP FOR REFILLS  01/08/16  Yes Katheren Shams, DO  aspirin 325 MG tablet Take 650 mg by mouth daily.   Yes Historical Provider, MD  aspirin EC 81 MG tablet Take 1 tablet (81 mg total) by mouth daily. 01/08/16  Yes Katheren Shams, DO  atorvastatin (LIPITOR) 40 MG tablet Take 1 tablet (40 mg total) by mouth daily. 01/08/16  Yes Katheren Shams, DO  carvedilol (COREG) 25 MG tablet Take 1 tablet (25 mg total) by mouth 2 (two) times daily with a meal. Need PCP follow-up for further refills. 01/08/16  Yes Katheren Shams, DO  clobetasol cream (TEMOVATE) 3.29 % Apply 1 application topically 3 (three) times daily.   Yes Historical Provider, MD   gabapentin (NEURONTIN) 300 MG capsule TAKE 1 CAPSULE BY MOUTH THREE TIMES DAILY 01/08/16  Yes Katheren Shams, DO  Insulin Glargine (LANTUS SOLOSTAR) 100 UNIT/ML Solostar Pen INJECT 20 UNITS EVERY MORNING 01/08/16  Yes Katheren Shams, DO  linaclotide (LINZESS) 290 MCG CAPS capsule TAKE 1 CAPSULE (290 MCG TOTAL) BY MOUTH DAILY. 01/19/16  Yes Katheren Shams, DO  metFORMIN (GLUCOPHAGE-XR) 500 MG 24 hr tablet Take 1 tablet (500 mg total) by mouth daily with breakfast. 01/19/16  Yes Katheren Shams, DO  omeprazole (PRILOSEC) 20 MG capsule Take 1 capsule (20 mg total) by mouth daily. 01/21/16 02/04/16 Yes Melony Overly, MD  ranitidine (ZANTAC) 150 MG tablet Take 1 tablet (150 mg total) by mouth 2 (two) times daily. 01/08/16  Yes Katheren Shams, DO  sucralfate (CARAFATE) 1 GM/10ML suspension Take 10 mLs (1 g total) by mouth 4 (four) times daily -  with meals and at bedtime. 01/21/16  Yes Melony Overly, MD  timolol (TIMOPTIC) 0.5 % ophthalmic solution Place 1 drop into both eyes 2 (two) times daily.   Yes Historical Provider, MD  Travoprost, BAK Free, (TRAVATAN) 0.004 % SOLN ophthalmic solution Place 1 drop into both eyes at bedtime.   Yes Historical Provider, MD  valsartan (DIOVAN) 160 MG tablet TAKE 1 TABLET (160 MG TOTAL) BY MOUTH DAILY. 01/08/16  Yes Katheren Shams, DO  varenicline (CHANTIX) 1 MG tablet Take 1 tablet (1 mg total) by mouth 2 (two) times daily. 01/08/16  Yes Katheren Shams, DO  Insulin Pen Needle (PEN NEEDLES) 32G X 4 MM MISC 1 Stick by Does not apply route daily. 01/19/16   Katheren Shams, DO   Allergies  Allergen Reactions  . Penicillins Hives and Rash    Has patient had a PCN reaction causing immediate rash, facial/tongue/throat swelling, SOB or lightheadedness with hypotension:   # # YES # #   Has patient had a PCN reaction causing severe rash involving mucus membranes or skin necrosis:  # # NO # #  Has patient had a PCN reaction that required hospitalization  # # NO # #  Has patient had a  PCN reaction occurring within the last 10 years:  # # NO # #  If all of the above answers are "NO", then may proceed with Cephalosporin use.   . Codeine Nausea And Vomiting  . Morphine And Related Nausea Only  . Tomato Hives and Itching    Social History  Substance Use Topics  . Smoking status: Current Some Day Smoker    Packs/day: 0.18    Years: 36.00    Types: Cigarettes    Start date: 03/14/1978  . Smokeless tobacco: Never Used     Comment: starting chantix, hx 1 1/2 PPD(has  decrease to 2 cigs per day)  . Alcohol use No    Family History  Problem Relation Age of Onset  . Brain cancer Maternal Aunt   . Depression Mother   . Heart disease Mother   . Depression Son   . Diabetes Maternal Grandmother   . Stroke Maternal Grandmother   . Depression Maternal Grandmother   . Depression Daughter   . Malignant hyperthermia Son 16    tonsils drained     Review of Systems  Positive ROS: neg  All other systems have been reviewed and were otherwise negative with the exception of those mentioned in the HPI and as above.  Objective: Vital signs in last 24 hours: Temp:  [98.4 F (36.9 C)] 98.4 F (36.9 C) (11/22 0634) Pulse Rate:  [84] 84 (11/22 0634) Resp:  [20] 20 (11/22 0634) BP: (144)/(87) 144/87 (11/22 0634) SpO2:  [99 %] 99 % (11/22 0634) Weight:  [100.2 kg (221 lb)] 100.2 kg (221 lb) (11/22 7591)  General Appearance: Alert, cooperative, no distress, appears stated age Head: Normocephalic, without obvious abnormality, atraumatic Eyes: PERRL, conjunctiva/corneas clear, EOM'[name] intact    Neck: Supple, symmetrical, trachea midline Back: Symmetric, no curvature, ROM normal, no CVA tenderness Lungs:  respirations unlabored Heart: Regular rate and rhythm Abdomen: Soft, non-tender Extremities: Extremities normal, atraumatic, no cyanosis or edema Pulses: 2+ and symmetric all extremities Skin: Skin color, texture, turgor normal, no rashes or lesions  NEUROLOGIC:   Mental  status: Alert and oriented x4,  no aphasia, good attention span, fund of knowledge, and memory Motor Exam - grossly normal Sensory Exam - grossly normal Reflexes: trace Coordination - grossly normal Gait - grossly normal Balance - grossly normal Cranial Nerves: I: smell Not tested  II: visual acuity  OS: nl    OD: nl  II: visual fields Full to confrontation  II: pupils Equal, round, reactive to light  III,VII: ptosis None  III,IV,VI: extraocular muscles  Full ROM  V: mastication Normal  V: facial light touch sensation  Normal  V,VII: corneal reflex  Present  VII: facial muscle function - upper  Normal  VII: facial muscle function - lower Normal  VIII: hearing Not tested  IX: soft palate elevation  Normal  IX,X: gag reflex Present  XI: trapezius strength  5/5  XI: sternocleidomastoid strength 5/5  XI: neck flexion strength  5/5  XII: tongue strength  Normal    Data Review Lab Results  Component Value Date   WBC 10.9 (H) 02/03/2016   HGB 16.9 (H) 02/03/2016   HCT 50.1 (H) 02/03/2016   MCV 87.3 02/03/2016   PLT 202 02/03/2016   Lab Results  Component Value Date   NA 138 02/03/2016   K 3.4 (L) 02/03/2016   CL 104 02/03/2016   CO2 23 02/03/2016   BUN 11 02/03/2016   CREATININE 1.34 (H) 02/03/2016   GLUCOSE 187 (H) 02/03/2016   Lab Results  Component Value Date   INR 1.02 12/30/2015    Assessment/Plan: Patient admitted for SCS. Patient has failed a reasonable attempt at conservative therapy.  I explained the condition and procedure to the patient and answered any questions.  Patient wishes to proceed with procedure as planned. Understands risks/ benefits and typical outcomes of procedure.   Theresa [name] 02/03/2016 8:35 AM

## 2016-02-03 NOTE — Progress Notes (Signed)
Pharmacy Antibiotic Note  Theresa [name] is a 50 y.o. female admitted on 02/03/2016 for a planned permanent spinal cord stimulator lead and IPG battery placement. Pharmacy has been consulted for Vancoycin dosing for surgical prophylaxis post-op.  The patient had a dose of Vancomycin pre-op given at 0930. No drain is place - will schedule the patient to receive one dose post-op. SCr 1.34, CrCl~50-60 ml/min.   Plan: 1. Vancomycin 1g IV x 1 dose at 2130 (~12 hours after pre-op dose given) 2. Pharmacy will sign off as no further doses expected at this time  Height: '5\' 2"'$  (157.5 cm) Weight: 221 lb (100.2 kg) IBW/kg (Calculated) : 50.1  Temp (24hrs), Avg:98.1 F (36.7 C), Min:97.7 F (36.5 C), Max:98.5 F (36.9 C)   Recent Labs Lab 02/03/16 0645  WBC 10.9*  CREATININE 1.34*    Estimated Creatinine Clearance: 54.3 mL/min (by C-G formula based on SCr of 1.34 mg/dL (H)).    Allergies  Allergen Reactions  . Penicillins Hives and Rash    Has patient had a PCN reaction causing immediate rash, facial/tongue/throat swelling, SOB or lightheadedness with hypotension:   # # YES # #   Has patient had a PCN reaction causing severe rash involving mucus membranes or skin necrosis:  # # NO # #  Has patient had a PCN reaction that required hospitalization  # # NO # #  Has patient had a PCN reaction occurring within the last 10 years:  # # NO # #  If all of the above answers are "NO", then may proceed with Cephalosporin use.   . Codeine Nausea And Vomiting  . Morphine And Related Nausea Only  . Tomato Hives and Itching    Lawson Radar 02/03/2016 2:04 PM

## 2016-02-03 NOTE — Transfer of Care (Signed)
Immediate Anesthesia Transfer of Care Note  Patient: Theresa [name]  Procedure(s) Performed: Procedure(s): LUMBAR SPINAL CORD STIMULATOR INSERTION (N/A)  Patient Location: PACU  Anesthesia Type:General  Level of Consciousness: awake, alert  and oriented  Airway & Oxygen Therapy: Patient Spontanous Breathing and Patient connected to nasal cannula oxygen  Post-op Assessment: Report given to RN and Post -op Vital signs reviewed and stable  Post vital signs: Reviewed and stable  Last Vitals:  Vitals:   02/03/16 0634 02/03/16 1103  BP: (!) 144/87   Pulse: 84   Resp: 20   Temp: 36.9 C 36.5 C    Last Pain:  Vitals:   02/03/16 0648  TempSrc:   PainSc: 9       Patients Stated Pain Goal: 5 (25/49/82 6415)  Complications: No apparent anesthesia complications

## 2016-02-03 NOTE — Op Note (Signed)
02/03/2016  10:53 AM  PATIENT:  Theresa [name]  50 y.o. female  PRE-OPERATIVE DIAGNOSIS:  Postlaminectomy syndrome  POST-OPERATIVE DIAGNOSIS:  Same  PROCEDURE:  Insertion of permanent spinal cord stimulator lead and IPG battery  SURGEON:  Sherley Bounds, MD  ASSISTANTS: None  ANESTHESIA:   General  EBL: 50 ml  Total I/O In: -  Out: 50 [Blood:50]  BLOOD ADMINISTERED:none  DRAINS: None   SPECIMEN:  No Specimen  INDICATION FOR PROCEDURE: This patient underwent a lumbar fusion in the past. She had chronic back and leg pain. She was managed in pain management. She did well with a spinal cord stimulator percutaneous trial. She presents today for placement of permanent spinal cord stimulator and battery.  Patient understood the risks, benefits, and alternatives and potential outcomes and wished to proceed.  PROCEDURE DETAILS: The patient was taken to the operating room and after induction of adequate generalized endotracheal anesthesia she was rolled into the prone position on chest rolls, pressure points were padded. We used AP fluoroscopy to identify the T9-10 region in order to determine where to place our incision. Her thoracic and lumbar region was then cleaned with Betadine scrub and then prepped with DuraPrep and draped in usual sterile fashion. A timeout was performed. I then made an incision in the left flank as well as an incision in the thoracic region over T9-10. In the left flank I dissected through the soft tissues to create a pocket for the battery. I then placed a bacitracin-soaked sponge into the pocket. I then continued by thoracic dissection by a taking down the paraspinous musculature and a subperiosteal fashion to expose T9-10. Intraoperative fluoroscopy confirmed my level. I then used combination of high-speed drill and Kerrison punches to perform laminectomy and medial facetectomy at T9-10. I then used a Hydrographic surveyor passer with AP fluoroscopy to create space for the  final lead. We then passed the lead to the T7-8 disc space to cover T8. The lead was then sewn to the interspinous ligament. A shunt passer was used to pass between the 2 incisions and then the leads were passed to the flank incision. There were then attached to the battery and impedances were checked. They were then locked into the battery and the battery was then placed into the pocket. I then irrigated with saline containing bacitracin. I then closed the fascia with 0 Vicryl. I closed the subcutaneous tissues with 2-0 Vicryl. I closed the cuticular tissues with 3-0 Vicryl. The vancomycin powder was used in the wounds prior to closure. Then closed the skin with benzoin and Steri-Strips. Sterile dressings were applied. The drapes were removed. The patient was awakened and transported to the recovery room in stable condition. At the end of the procedure all sponge needle and instrument counts were correct.  PLAN OF CARE: Admit for overnight observation  PATIENT DISPOSITION:  PACU - hemodynamically stable.   Delay start of Pharmacological VTE agent (>24hrs) due to surgical blood loss or risk of bleeding:  yes

## 2016-02-03 NOTE — Anesthesia Procedure Notes (Signed)
Procedure Name: Intubation Date/Time: 02/03/2016 8:47 AM Performed by: Candis Shine Pre-anesthesia Checklist: Patient identified, Emergency Drugs available, Suction available and Patient being monitored Patient Re-evaluated:Patient Re-evaluated prior to inductionOxygen Delivery Method: Circle System Utilized Preoxygenation: Pre-oxygenation with 100% oxygen Intubation Type: IV induction Ventilation: Mask ventilation without difficulty Laryngoscope Size: Glidescope and 3 Grade View: Grade I Tube type: Oral Tube size: 7.5 mm Number of attempts: 1 Airway Equipment and Method: Stylet and Oral airway Placement Confirmation: ETT inserted through vocal cords under direct vision,  positive ETCO2 and breath sounds checked- equal and bilateral Secured at: 21 cm Tube secured with: Tape Dental Injury: Teeth and Oropharynx as per pre-operative assessment

## 2016-02-03 NOTE — Anesthesia Postprocedure Evaluation (Signed)
Anesthesia Post Note  Patient: SUSI GOSLIN  Procedure(s) Performed: Procedure(s) (LRB): LUMBAR SPINAL CORD STIMULATOR INSERTION (N/A)  Patient location during evaluation: PACU Anesthesia Type: General Level of consciousness: awake and alert and oriented Pain management: pain level controlled Vital Signs Assessment: post-procedure vital signs reviewed and stable Respiratory status: spontaneous breathing, nonlabored ventilation and respiratory function stable Cardiovascular status: blood pressure returned to baseline and stable Postop Assessment: no signs of nausea or vomiting Anesthetic complications: no    Last Vitals:  Vitals:   02/03/16 1311 02/03/16 1315  BP:    Pulse: 66 70  Resp:    Temp:      Last Pain:  Vitals:   02/03/16 1311  TempSrc:   PainSc: 10-Worst pain ever                 Duante Arocho A.

## 2016-02-03 NOTE — Progress Notes (Signed)
Patient alert and oriented, mae's well, voiding adequate amount of urine, swallowing without difficulty, c/o mild pain at time of discharge. Patient discharged home with family. Script and discharged instructions given to patient. Patient and family stated understanding of instructions given. Patient has an appointment with Dr. Jones   

## 2016-02-05 ENCOUNTER — Encounter (HOSPITAL_COMMUNITY): Payer: Self-pay | Admitting: Neurological Surgery

## 2016-02-25 ENCOUNTER — Other Ambulatory Visit: Payer: Medicare Other

## 2016-02-25 ENCOUNTER — Ambulatory Visit (HOSPITAL_COMMUNITY): Payer: Medicare Other

## 2016-03-01 ENCOUNTER — Ambulatory Visit (HOSPITAL_COMMUNITY)
Admission: RE | Admit: 2016-03-01 | Discharge: 2016-03-01 | Disposition: A | Discharge status: Home / Self Care | Payer: Medicare Other | Source: Ambulatory Visit | Attending: Internal Medicine | Admitting: Internal Medicine

## 2016-03-01 ENCOUNTER — Encounter (HOSPITAL_COMMUNITY): Payer: Self-pay

## 2016-03-01 DIAGNOSIS — C3492 Malignant neoplasm of unspecified part of left bronchus or lung: Secondary | ICD-10-CM | POA: Diagnosis not present

## 2016-03-03 ENCOUNTER — Encounter: Payer: Self-pay | Admitting: Internal Medicine

## 2016-03-03 ENCOUNTER — Ambulatory Visit (HOSPITAL_BASED_OUTPATIENT_CLINIC_OR_DEPARTMENT_OTHER): Payer: Medicare Other | Admitting: Internal Medicine

## 2016-03-03 ENCOUNTER — Telehealth: Payer: Self-pay | Admitting: Internal Medicine

## 2016-03-03 VITALS — BP 120/63 | HR 76 | Temp 98.9°F | Resp 17 | Ht 62.0 in | Wt 209.3 lb

## 2016-03-03 DIAGNOSIS — Z85118 Personal history of other malignant neoplasm of bronchus and lung: Secondary | ICD-10-CM

## 2016-03-03 DIAGNOSIS — C3492 Malignant neoplasm of unspecified part of left bronchus or lung: Secondary | ICD-10-CM

## 2016-03-03 NOTE — Telephone Encounter (Signed)
No LOS per 03/03/16 date of service. Message sent to Dr Julien Nordmann.

## 2016-03-03 NOTE — Progress Notes (Signed)
Theresa [name] Telephone:([phone]) [phone]   Fax:(336) [phone]  OFFICE PROGRESS NOTE  [address] [place] [address]  DIAGNOSIS: Recurrent non-small cell lung cancer initially diagnosed as stage IA (T1b, N0, MX) adenocarcinoma in November 2009.   PRIOR THERAPY:  #1 Status post left upper lobe trisegmentectomy with lymph node dissection under the care of Dr. Arlyce Dice on March 24, 2009.  #2 Concurrent chemoradiation with carboplatin for AUC of 2 and paclitaxel 45 mg/M2 given weekly with radiation.   CURRENT THERAPY: Observation.  CHEMOTHERAPY INTENT: Curative  CURRENT # OF CHEMOTHERAPY CYCLES: 0  CURRENT ANTIEMETICS: None  CURRENT SMOKING STATUS: Current smoker and I strongly encouraged her to quit smoking and altered her to smoke cessation program.  ORAL CHEMOTHERAPY AND CONSENT: None  CURRENT BISPHOSPHONATES USE: None  PAIN MANAGEMENT: 0/10  NARCOTICS INDUCED CONSTIPATION: None  LIVING WILL AND CODE STATUS: No CODE BLUE   INTERVAL HISTORY: Theresa [name] 50 y.o. female came to the clinic today for follow-up visit accompanied by her daughter. The patient is feeling fine today with no specific complaints except for some cold symptoms and she is taking vitamin C and over-the-counter medications. She denied having any chest pain, shortness of breath but has mild cough with no hemoptysis. She denied having any weight loss or night sweats. She has no nausea or vomiting. She had repeat CT scan of the chest performed recently and she is here for evaluation and discussion of her scan results.  MEDICAL HISTORY: Past Medical History:  Diagnosis Date  . Anxiety   . Arthritis   . Blindness, legal    glaucoma and retinitis  pigmentosa  . Chronic eczema   . Chronic headaches 10-17-11   not migraines- just regular  . Constipation   . Depression   . Diabetes mellitus type II    diet   . Family history of anesthesia complication    1 son  had malignant hyperthermia at 29yr  tonsils rained  . Family history of malignant hyperthermia    1 son 16 years ago  . Fibromyalgia    denies, has not been diagnosited  . GERD (gastroesophageal reflux disease)   . Glaucoma   . History of chemotherapy 01/2011 to 03/2011   concurrent w/radiation therapy  . Hx of radiation therapy 01-17-11 to 03-18-11   lung  . Hyperlipidemia   . Hypertension   . Lung cancer ([facility]    lung ca dx 2010  . Malignant hyperthermia    As of 12/30/15, no personal MH history, but reported her son has a history of MH ~ 2000 (no confirmatory testing done)  . Onychomycosis   . Retinitis pigmentosa   . Shortness of breath    with exertion  . Sleep apnea    cpap 5 yrs    ALLERGIES:  is allergic to penicillins; codeine; morphine and related; and tomato.  MEDICATIONS:  Current Outpatient Prescriptions  Medication Sig Dispense Refill  . albuterol (PROVENTIL HFA;VENTOLIN HFA) 108 (90 Base) MCG/ACT inhaler Inhale 2 puffs into the lungs every 4 (four) hours as needed for wheezing or shortness of breath. 1 Inhaler 2  . amLODipine (NORVASC) 10 MG tablet TAKE 1 TABLET BY MOUTH EVERY DAY, FOLLOW UP WITH PCP FOR REFILLS 90 tablet 3  . aspirin 325 MG tablet Take 650 mg by mouth daily.    .Marland Kitchenaspirin EC 81 MG tablet Take 1 tablet (81 mg total) by mouth daily. 30 tablet 11  .  atorvastatin (LIPITOR) 40 MG tablet Take 1 tablet (40 mg total) by mouth daily. 90 tablet 3  . carvedilol (COREG) 25 MG tablet Take 1 tablet (25 mg total) by mouth 2 (two) times daily with a meal. Need PCP follow-up for further refills. 120 tablet 0  . clobetasol cream (TEMOVATE) 8.34 % Apply 1 application topically 3 (three) times daily.    Marland Kitchen gabapentin (NEURONTIN) 300 MG capsule TAKE 1 CAPSULE BY MOUTH THREE TIMES DAILY 270 capsule 0  . Insulin Glargine (LANTUS SOLOSTAR) 100 UNIT/ML Solostar Pen INJECT 20 UNITS EVERY MORNING 15 pen 2  . Insulin Pen Needle (PEN NEEDLES) 32G X 4 MM MISC 1 Stick by Does not  apply route daily. 100 each 1  . linaclotide (LINZESS) 290 MCG CAPS capsule TAKE 1 CAPSULE (290 MCG TOTAL) BY MOUTH DAILY. 30 capsule 1  . metFORMIN (GLUCOPHAGE-XR) 500 MG 24 hr tablet Take 1 tablet (500 mg total) by mouth daily with breakfast. 30 tablet 6  . omeprazole (PRILOSEC) 20 MG capsule Take 1 capsule (20 mg total) by mouth daily. 30 capsule 0  . ranitidine (ZANTAC) 150 MG tablet Take 1 tablet (150 mg total) by mouth 2 (two) times daily. 60 tablet 3  . sucralfate (CARAFATE) 1 GM/10ML suspension Take 10 mLs (1 g total) by mouth 4 (four) times daily -  with meals and at bedtime. 420 mL 0  . timolol (TIMOPTIC) 0.5 % ophthalmic solution Place 1 drop into both eyes 2 (two) times daily.    . Travoprost, BAK Free, (TRAVATAN) 0.004 % SOLN ophthalmic solution Place 1 drop into both eyes at bedtime.    . valsartan (DIOVAN) 160 MG tablet TAKE 1 TABLET (160 MG TOTAL) BY MOUTH DAILY. 90 tablet 3  . varenicline (CHANTIX) 1 MG tablet Take 1 tablet (1 mg total) by mouth 2 (two) times daily. 60 tablet 1   No current facility-administered medications for this visit.     SURGICAL HISTORY:  Past Surgical History:  Procedure Laterality Date  . ABDOMINAL HYSTERECTOMY  09/19/2001   hx. of fibroids. TAH. Ovaries remain.   Marland Kitchen BACK SURGERY  06/2013  . CHEST TUBE INSERTION   04/01/08   L hydropneumothorax  . EYE SURGERY Bilateral    lens implant  . KNEE ARTHROSCOPY  10-17-11   bil. knee scope(left was torn ligament)  . LUMBAR LAMINECTOMY/DECOMPRESSION MICRODISCECTOMY  10/27/2011   Procedure: LUMBAR LAMINECTOMY/DECOMPRESSION MICRODISCECTOMY;  Surgeon: Johnn Hai, MD;  Location: [facility];  Service: Orthopedics;  Laterality: N/A;  L4-L5  . lung surgery  03/24/08   L vats, L thoracotomy and LUL trisegmentectomy with node dissection  . MAXIMUM ACCESS (MAS)POSTERIOR LUMBAR INTERBODY FUSION (PLIF) 1 LEVEL N/A 07/03/2013   Procedure: FOR MAXIMUM ACCESS (MAS) POSTERIOR LUMBAR INTERBODY FUSION (PLIF) 1 LEVEL;  Surgeon:  Eustace Moore, MD;  Location: [facility];  Service: Neurosurgery;  Laterality: N/A;  FOR MAXIMUM ACCESS (MAS) POSTERIOR LUMBAR INTERBODY FUSION (PLIF) 1 LEVEL (L4-L5)  . PORT-A-CATH REMOVAL Right 11/26/2013   Procedure: REMOVAL PORT-A-CATH;  Surgeon: Gaye Pollack, MD;  Location: [facility];  Service: Thoracic;  Laterality: Right;  . PORTACATH PLACEMENT  02/11/2011   Procedure: INSERTION PORT-A-CATH;  Surgeon: Pierre Bali, MD;  Location: [facility];  Service: Thoracic;  Laterality: Right;  9.6Fr. Pre-attached Power Port in Right Internal Jugular  -right chest-remains inplace 10-17-11  . SPINAL CORD STIMULATOR INSERTION N/A 02/03/2016   Procedure: LUMBAR SPINAL CORD STIMULATOR INSERTION;  Surgeon: Eustace Moore, MD;  [facility];  Service: Neurosurgery;  Laterality: N/A;    REVIEW OF SYSTEMS:  A comprehensive review of systems was negative except for: Respiratory: positive for cough   PHYSICAL EXAMINATION: General appearance: alert, cooperative and no distress Head: Normocephalic, without obvious abnormality, atraumatic Neck: no adenopathy Lymph nodes: Cervical, supraclavicular, and axillary nodes normal. Resp: clear to auscultation bilaterally Back: symmetric, no curvature. ROM normal. No CVA tenderness. Cardio: regular rate and rhythm, S1, S2 normal, no murmur, click, rub or gallop GI: soft, non-tender; bowel sounds normal; no masses,  no organomegaly Extremities: extremities normal, atraumatic, no cyanosis or edema  ECOG PERFORMANCE STATUS: 1 - Symptomatic but completely ambulatory  Blood pressure 120/63, pulse 76, temperature 98.9 F (37.2 C), temperature source Oral, resp. rate 17, height '5\' 2"'$  (1.575 m), weight 209 lb 4.8 oz (94.9 kg), SpO2 98 %.  LABORATORY DATA: Lab Results  Component Value Date   WBC 10.9 (H) 02/03/2016   HGB 16.9 (H) 02/03/2016   HCT 50.1 (H) 02/03/2016   MCV 87.3 02/03/2016   PLT 202 02/03/2016      Chemistry      Component Value Date/Time   NA 138  02/03/2016 0645   NA 139 02/06/2015 0901   K 3.4 (L) 02/03/2016 0645   K 3.9 02/06/2015 0901   CL 104 02/03/2016 0645   CL 102 01/18/2012 0805   CO2 23 02/03/2016 0645   CO2 28 02/06/2015 0901   BUN 11 02/03/2016 0645   BUN 10.8 02/06/2015 0901   CREATININE 1.34 (H) 02/03/2016 0645   CREATININE 1.5 (H) 02/06/2015 0901      Component Value Date/Time   CALCIUM 9.2 02/03/2016 0645   CALCIUM 9.6 02/06/2015 0901   ALKPHOS 60 02/23/2015 1712   ALKPHOS 71 02/06/2015 0901   AST 16 02/23/2015 1712   AST 12 02/06/2015 0901   ALT 17 02/23/2015 1712   ALT 16 02/06/2015 0901   BILITOT 1.5 (H) 02/23/2015 1712   BILITOT 0.87 02/06/2015 0901       RADIOGRAPHIC STUDIES: Ct Chest Wo Contrast  Result Date: 03/01/2016 CLINICAL DATA:  Lung cancer diagnosed 2010. Post radiation and chemotherapy EXAM: CT CHEST WITHOUT CONTRAST TECHNIQUE: Multidetector CT imaging of the chest was performed following the standard protocol without IV contrast. COMPARISON:  CT 02/06/2015 FINDINGS: Cardiovascular: No significant vascular findings. Normal heart size. No pericardial effusion. Mediastinum/Nodes: No axillary or supraclavicular lymphadenopathy. Mediastinal adenopathy. Esophagus normal. Lungs/Pleura: There is volume loss in the LEFT hemithorax and LEFT upper lobectomy. There is stable suprahilar consolidation with air bronchograms. Pleural thickening in the LEFT upper pleural space. Note new discrete nodules. RIGHT lung is clear. Upper Abdomen: Limited view of the liver, kidneys, pancreas are unremarkable. Normal adrenal glands. Set Musculoskeletal: No aggressive osseous lesion. Spinal stimulator noted. IMPRESSION: Post therapy change in the LEFT hemithorax. No evidence of recurrence. Electronically Signed   By: Suzy Bouchard M.D.   On: 03/01/2016 17:08   Dg Thoracic Spine 1 View  Result Date: 02/03/2016 CLINICAL DATA:  Spinal cord stimulator insertion EXAM: OPERATIVE THORACIC SPINE single VIEW() COMPARISON:   None. FINDINGS: Single intraoperative view of thoracic spine submitted. Four wires spinal cord stimulator are noted in the region of lower thoracic spine. Fluoroscopy time was 25 seconds.  Please see the operative report. IMPRESSION: 4 wires spinal cord stimulator leads are noted in the region of lower thoracic spine. Please see the operative report. Electronically Signed   By: Lahoma Crocker M.D.   On: 02/03/2016 10:34   Dg C-arm  61-120 Min  Result Date: 02/03/2016 CLINICAL DATA:  Spinal cord stimulator insertion EXAM: OPERATIVE THORACIC SPINE single VIEW() COMPARISON:  None. FINDINGS: Single intraoperative view of thoracic spine submitted. Four wires spinal cord stimulator are noted in the region of lower thoracic spine. Fluoroscopy time was 25 seconds.  Please see the operative report. IMPRESSION: 4 wires spinal cord stimulator leads are noted in the region of lower thoracic spine. Please see the operative report. Electronically Signed   By: Lahoma Crocker M.D.   On: 02/03/2016 10:34   ASSESSMENT AND PLAN: This is a very pleasant 50 years old African-American female with history of non-small cell lung cancer diagnosed in 2009 status post concurrent chemoradiation. She has been observation for more than 8 years now. The recent CT scan of the chest showed no evidence for disease recurrence. I discussed the scan results with the patient and her daughter. I recommended for her to continue on observation with routine follow-up visit by her primary care physician at this point. I will be happy to see the patient in the future if needed. The patient was advised to call immediately if she has any concerning symptoms. The patient voices understanding of current disease status and treatment options and is in agreement with the current care plan.  All questions were answered. The patient knows to call the clinic with any problems, questions or concerns. We can certainly see the patient much sooner if necessary. I  spent 10 minutes counseling the patient face to face. The total time spent in the appointment was 15 minutes. Disclaimer: This note was dictated with voice recognition software. Similar sounding words can inadvertently be transcribed and may not be corrected upon review.

## 2016-03-04 ENCOUNTER — Telehealth: Payer: Self-pay | Admitting: Internal Medicine

## 2016-03-04 NOTE — Telephone Encounter (Signed)
No follow up appointment needed, per Dr Julien Nordmann, per schedule message, per 03/03/16 los/visit.

## 2016-03-17 ENCOUNTER — Other Ambulatory Visit: Payer: Self-pay | Admitting: Obstetrics and Gynecology

## 2016-04-14 ENCOUNTER — Other Ambulatory Visit: Payer: Self-pay | Admitting: Obstetrics and Gynecology

## 2016-04-14 DIAGNOSIS — R1013 Epigastric pain: Secondary | ICD-10-CM

## 2016-04-27 ENCOUNTER — Other Ambulatory Visit: Payer: Self-pay | Admitting: Obstetrics and Gynecology

## 2016-04-27 DIAGNOSIS — I10 Essential (primary) hypertension: Secondary | ICD-10-CM

## 2016-04-27 DIAGNOSIS — E785 Hyperlipidemia, unspecified: Secondary | ICD-10-CM

## 2016-04-27 MED ORDER — CARVEDILOL 25 MG PO TABS: 25.0000 mg | ORAL_TABLET | Freq: Two times a day (BID) | ORAL | 0 refills | Status: DC

## 2016-04-27 NOTE — Telephone Encounter (Signed)
Needs refill on cardvelol. [facility] [address].  She has an appt Feb 22

## 2016-05-05 ENCOUNTER — Other Ambulatory Visit: Payer: Self-pay | Admitting: Obstetrics and Gynecology

## 2016-05-05 ENCOUNTER — Encounter: Payer: Self-pay | Admitting: Obstetrics and Gynecology

## 2016-05-05 ENCOUNTER — Ambulatory Visit (INDEPENDENT_AMBULATORY_CARE_PROVIDER_SITE_OTHER): Payer: Medicare Other | Admitting: Obstetrics and Gynecology

## 2016-05-05 VITALS — BP 126/74 | HR 74 | Temp 98.5°F | Ht 62.0 in | Wt 213.0 lb

## 2016-05-05 DIAGNOSIS — E785 Hyperlipidemia, unspecified: Secondary | ICD-10-CM | POA: Diagnosis not present

## 2016-05-05 DIAGNOSIS — I10 Essential (primary) hypertension: Secondary | ICD-10-CM

## 2016-05-05 DIAGNOSIS — F331 Major depressive disorder, recurrent, moderate: Secondary | ICD-10-CM | POA: Diagnosis not present

## 2016-05-05 DIAGNOSIS — E1165 Type 2 diabetes mellitus with hyperglycemia: Secondary | ICD-10-CM | POA: Diagnosis not present

## 2016-05-05 DIAGNOSIS — N183 Chronic kidney disease, stage 3 unspecified: Secondary | ICD-10-CM

## 2016-05-05 DIAGNOSIS — C3492 Malignant neoplasm of unspecified part of left bronchus or lung: Secondary | ICD-10-CM | POA: Diagnosis not present

## 2016-05-05 DIAGNOSIS — F17200 Nicotine dependence, unspecified, uncomplicated: Secondary | ICD-10-CM | POA: Diagnosis not present

## 2016-05-05 DIAGNOSIS — Z794 Long term (current) use of insulin: Secondary | ICD-10-CM

## 2016-05-05 LAB — POCT GLYCOSYLATED HEMOGLOBIN (HGB A1C): Hemoglobin A1C: 7.3

## 2016-05-05 MED ORDER — LINACLOTIDE 290 MCG PO CAPS: ORAL_CAPSULE | ORAL | 1 refills | Status: DC

## 2016-05-05 MED ORDER — VALSARTAN 160 MG PO TABS: ORAL_TABLET | ORAL | 3 refills | Status: DC

## 2016-05-05 MED ORDER — METFORMIN HCL ER (OSM) 1000 MG PO TB24: 1000.0000 mg | ORAL_TABLET | Freq: Two times a day (BID) | ORAL | 3 refills | Status: DC

## 2016-05-05 MED ORDER — ATORVASTATIN CALCIUM 40 MG PO TABS: 40.0000 mg | ORAL_TABLET | Freq: Every day | ORAL | 3 refills | Status: DC

## 2016-05-05 MED ORDER — INSULIN GLARGINE 100 UNIT/ML SOLOSTAR PEN: PEN_INJECTOR | SUBCUTANEOUS | 2 refills | Status: DC

## 2016-05-05 MED ORDER — CARVEDILOL 25 MG PO TABS: 25.0000 mg | ORAL_TABLET | Freq: Two times a day (BID) | ORAL | 0 refills | Status: DC

## 2016-05-05 MED ORDER — ALBUTEROL SULFATE HFA 108 (90 BASE) MCG/ACT IN AERS: 2.0000 | INHALATION_SPRAY | RESPIRATORY_TRACT | 2 refills | Status: DC | PRN

## 2016-05-05 MED ORDER — ALBUTEROL SULFATE HFA 108 (90 BASE) MCG/ACT IN AERS: 2.0000 | INHALATION_SPRAY | RESPIRATORY_TRACT | 1 refills | Status: DC | PRN

## 2016-05-05 MED ORDER — GABAPENTIN 300 MG PO CAPS: ORAL_CAPSULE | ORAL | 0 refills | Status: DC

## 2016-05-05 MED ORDER — CLOBETASOL PROPIONATE 0.05 % EX CREA: 1.0000 "application " | TOPICAL_CREAM | Freq: Two times a day (BID) | CUTANEOUS | 1 refills | Status: DC

## 2016-05-05 MED ORDER — AMLODIPINE BESYLATE 10 MG PO TABS: ORAL_TABLET | ORAL | 3 refills | Status: DC

## 2016-05-05 NOTE — Telephone Encounter (Signed)
Pt was seen today and forgot to ask for a refill on gabapentin. Pt would like a 90 day supply on that and all other refills. Pt uses [facility] on [address]

## 2016-05-05 NOTE — Patient Instructions (Addendum)
Refilled all your medications. Can pick up in a hour. Restarted depression medication  Continue diabetes regimen  Follow-up in 3 months

## 2016-05-05 NOTE — Progress Notes (Signed)
Subjective: Chief Complaint  Patient presents with  . Diabetes  . Back Pain     HPI: Theresa [name] is a 51 y.o. presenting to clinic today to discuss the following:  #Diabetes:  Following up on diabetes Last A1c 13.3 Sugars have been improving at home This AM CBG was 230 Taking medications: lantus 20U, restarted metformin last time  Side effects: tolerating metformin, denies hypoglycemia On Aspirin On statin Last foot exam: Due ROS: denies fever, chills, dizziness, LOC, polyuria, polydipsia, numbness or tingling in extremities, chest pain, +visual disturbances (baseline)  #Tobacco Use Continues to smoke  Has cut back Now down to 3 cigs/day Used chantix some  #s/p lung cancer Has been dismissed by oncologist In remission  #CKD Not following with nephrologist Denies any blood in urine, dysuria, oliguria  #MDD Feels as though she needs to be restarted on depression medication Was previously on prozac States that health of mother is making her depressed denies HI/SI  Needs medication refills     ROS noted in HPI.   Past Medical, Surgical, Social, and Family History Reviewed & Updated per EMR.   Past Medical History:  Diagnosis Date  . Anxiety   . Arthritis   . Blindness, legal    glaucoma and retinitis  pigmentosa  . Chronic eczema   . Chronic headaches 10-17-11   not migraines- just regular  . Constipation   . Depression   . Diabetes mellitus type II    diet   . Family history of anesthesia complication    1 son had malignant hyperthermia at 51yr  tonsils rained  . Family history of malignant hyperthermia    1 son 16 years ago  . Fibromyalgia    denies, has not been diagnosited  . GERD (gastroesophageal reflux disease)   . Glaucoma   . History of chemotherapy 01/2011 to 03/2011   concurrent w/radiation therapy  . Hx of radiation therapy 01-17-11 to 03-18-11   lung  . Hyperlipidemia   . Hypertension   . [place]    lung ca dx 2010    . Malignant hyperthermia    As of 12/30/15, no personal MH history, but reported her son has a history of MH ~ 2000 (no confirmatory testing done)  . Onychomycosis   . Retinitis pigmentosa   . Shortness of breath    with exertion  . Sleep apnea    cpap 5 yrs    History  Smoking Status  . Current Some Day Smoker  . Packs/day: 0.18  . Years: 36.00  . Types: Cigarettes  . Start date: 03/14/1978  Smokeless Tobacco  . Never Used    Comment: starting chantix, hx 1 1/2 PPD(has decrease to 2 cigs per day)    Objective: BP 126/74   Pulse 74   Temp 98.5 F (36.9 C) (Oral)   Ht '5\' 2"'$  (1.575 m)   Wt 213 lb (96.6 kg)   SpO2 97%   BMI 38.96 kg/m  Vitals and nursing notes reviewed  Physical Exam  Constitutional: She is well-developed, well-nourished, and in no distress.  Obese  HENT:  Mouth/Throat: Oropharynx is clear and moist.  Eyes: EOM are normal.  Blurred vision  Cardiovascular: Normal rate and regular rhythm.   Pulmonary/Chest: Effort normal and breath sounds normal.  Musculoskeletal:  Back tenderness   Skin: Skin is warm and dry.  Psychiatric: Mood and affect normal.    Diabetic Foot Exam - Simple   Simple Foot Form  Diabetic Foot exam was performed with the following findings:  Yes 05/05/2016  1:41 PM  Visual Inspection No deformities, no ulcerations, no other skin breakdown bilaterally:  Yes Sensation Testing Intact to touch and monofilament testing bilaterally:  Yes Pulse Check Posterior Tibialis and Dorsalis pulse intact bilaterally:  Yes Comments    Depression screen PHQ 2/9 05/05/2016  Decreased Interest -  Down, Depressed, Hopeless 3  PHQ - 2 Score 3  Altered sleeping 3  Tired, decreased energy 3  Change in appetite 3  Feeling bad or failure about yourself  0  Trouble concentrating 3  Moving slowly or fidgety/restless 3  Suicidal thoughts 0  PHQ-9 Score 18  Difficult doing work/chores Somewhat difficult  Some recent data might be hidden     Recent Results (from the past 2160 hour(s))  HgB A1c     Status: None   Collection Time: 05/05/16  1:47 PM  Result Value Ref Range   Hemoglobin A1C 7.3     Assessment/Plan: Please see problem based Assessment and Plan PATIENT EDUCATION PROVIDED: See AVS    Diagnosis and plan along with any newly prescribed medication(s) were discussed in detail with this patient today. The patient verbalized understanding and agreed with the plan. Patient advised if symptoms worsen return to clinic or ER.    Orders Placed This Encounter  Procedures  . HgB A1c    Meds ordered this encounter  Medications  . valsartan (DIOVAN) 160 MG tablet    Sig: TAKE 1 TABLET (160 MG TOTAL) BY MOUTH DAILY.    Dispense:  90 tablet    Refill:  3  . carvedilol (COREG) 25 MG tablet    Sig: Take 1 tablet (25 mg total) by mouth 2 (two) times daily with a meal. Need PCP follow-up for further refills.    Dispense:  30 tablet    Refill:  0  . DISCONTD: albuterol (PROVENTIL HFA;VENTOLIN HFA) 108 (90 Base) MCG/ACT inhaler    Sig: Inhale 2 puffs into the lungs every 4 (four) hours as needed for wheezing or shortness of breath.    Dispense:  1 Inhaler    Refill:  2  . amLODipine (NORVASC) 10 MG tablet    Sig: TAKE 1 TABLET BY MOUTH EVERY DAY, FOLLOW UP WITH PCP FOR REFILLS    Dispense:  90 tablet    Refill:  3    **Patient requests 90 days supply**  . atorvastatin (LIPITOR) 40 MG tablet    Sig: Take 1 tablet (40 mg total) by mouth daily.    Dispense:  90 tablet    Refill:  3  . clobetasol cream (TEMOVATE) 0.05 %    Sig: Apply 1 application topically 2 (two) times daily.    Dispense:  60 g    Refill:  1  . Insulin Glargine (LANTUS SOLOSTAR) 100 UNIT/ML Solostar Pen    Sig: INJECT 20 UNITS EVERY MORNING    Dispense:  15 pen    Refill:  2  . linaclotide (LINZESS) 290 MCG CAPS capsule    Sig: TAKE 1 CAPSULE (290 MCG TOTAL) BY MOUTH DAILY.    Dispense:  30 capsule    Refill:  1  . DISCONTD: metformin  (FORTAMET) 1000 MG (OSM) 24 hr tablet    Sig: Take 1 tablet (1,000 mg total) by mouth 2 (two) times daily with a meal.    Dispense:  60 tablet    Refill:  3  . albuterol (PROVENTIL HFA;VENTOLIN HFA) 108 (90 Base) MCG/ACT  inhaler    Sig: Inhale 2 puffs into the lungs every 4 (four) hours as needed for wheezing or shortness of breath.    Dispense:  1 Inhaler    Refill:  [place], DO 05/05/2016, 1:46 PM PGY-3, [place]

## 2016-05-06 ENCOUNTER — Other Ambulatory Visit: Payer: Self-pay | Admitting: *Deleted

## 2016-05-06 MED ORDER — PEN NEEDLES 32G X 4 MM MISC: 1.0000 | Freq: Every day | 1 refills | Status: DC

## 2016-05-06 MED ORDER — FLUOXETINE HCL 20 MG PO CAPS: 20.0000 mg | ORAL_CAPSULE | Freq: Every day | ORAL | 0 refills | Status: DC

## 2016-05-06 MED ORDER — METFORMIN HCL ER 500 MG PO TB24: 1000.0000 mg | ORAL_TABLET | Freq: Two times a day (BID) | ORAL | 0 refills | Status: DC

## 2016-05-06 MED ORDER — GABAPENTIN 300 MG PO CAPS: ORAL_CAPSULE | ORAL | 0 refills | Status: DC

## 2016-05-06 NOTE — Telephone Encounter (Signed)
Patient advised [facility]  Derl Barrow, RN

## 2016-05-06 NOTE — Telephone Encounter (Signed)
Pt was told by the pharmacy they didn't receive metformin, gabapentin and insulin needles and Prozac.  [facility] [address]

## 2016-05-06 NOTE — Telephone Encounter (Signed)
Pt is calling back because her pharmacy is still telling her that they have not received the medication we sent today. Metformin, Pen Needles, gabapentin, Prozac. Can we just call have then check again. Je

## 2016-05-06 NOTE — Telephone Encounter (Signed)
[facility] stating Metformin ER 1000 mg OSM is not covered.  Please change to Glucophage XR.  Derl Barrow, RN

## 2016-05-06 NOTE — Telephone Encounter (Signed)
Medications corrected and sent to pharmacy.

## 2016-05-09 NOTE — Assessment & Plan Note (Signed)
DM better controlled today. A1c 7.3 which is down from 13 at last visit. Continue patient's Lantus 20U and metformin '1000mg'$  BID. Recheck in 3 months. Counseled on continued lifestyle modifications to help have continued control of sugars. Diabetic foot exam performed and was normal.

## 2016-05-09 NOTE — Assessment & Plan Note (Signed)
Patient with increased signs of depression. Wasn't on medication. No SI/HI. Restart Prozac. PHQ-9 today was 18.

## 2016-05-09 NOTE — Assessment & Plan Note (Signed)
History of non-small cell lung cancer diagnosed in 2009 status post concurrent chemoradiation. She has been observation for more than 8 years now. The recent CT scan of the chest showed no evidence for disease recurrence. Will continue observation. Oncology has signed off.

## 2016-05-09 NOTE — Assessment & Plan Note (Signed)
Stable. Controlled on current therapies. Continue. Refills given.

## 2016-05-09 NOTE — Assessment & Plan Note (Signed)
Stable. Continue to monitor. WIll eventually need nephrology referral.

## 2016-05-09 NOTE — Assessment & Plan Note (Signed)
Continues to smoke. Tobacco cessation again discussed. Patient not fully ready to quit and commit.

## 2016-05-16 ENCOUNTER — Other Ambulatory Visit: Payer: Self-pay | Admitting: Obstetrics and Gynecology

## 2016-05-16 DIAGNOSIS — E785 Hyperlipidemia, unspecified: Secondary | ICD-10-CM

## 2016-05-16 DIAGNOSIS — I10 Essential (primary) hypertension: Secondary | ICD-10-CM

## 2016-05-16 MED ORDER — CARVEDILOL 25 MG PO TABS: 25.0000 mg | ORAL_TABLET | Freq: Two times a day (BID) | ORAL | 1 refills | Status: DC

## 2016-05-16 NOTE — Telephone Encounter (Signed)
Pt called because her pharmacy didn't get the refill request for her Caredilol. We sent this is on 05/05/16 with al her other medications, but this one wasn't received can we call this in today please. jw

## 2016-05-16 NOTE — Telephone Encounter (Signed)
Refill request for carvedilol sent to PCP.  Burna Forts, BSN, RN-BC

## 2016-06-23 ENCOUNTER — Other Ambulatory Visit: Payer: Self-pay | Admitting: Obstetrics and Gynecology

## 2016-06-23 NOTE — Telephone Encounter (Signed)
Requesting refill too early. She had 90 day supply. Refusing refill.

## 2016-07-10 ENCOUNTER — Other Ambulatory Visit: Payer: Self-pay | Admitting: Obstetrics and Gynecology

## 2016-08-01 ENCOUNTER — Ambulatory Visit (INDEPENDENT_AMBULATORY_CARE_PROVIDER_SITE_OTHER): Payer: Medicare Other | Admitting: Obstetrics and Gynecology

## 2016-08-01 ENCOUNTER — Encounter: Payer: Self-pay | Admitting: Obstetrics and Gynecology

## 2016-08-01 VITALS — BP 122/82 | HR 77 | Temp 98.4°F | Wt 207.6 lb

## 2016-08-01 DIAGNOSIS — Z794 Long term (current) use of insulin: Secondary | ICD-10-CM | POA: Diagnosis not present

## 2016-08-01 DIAGNOSIS — F330 Major depressive disorder, recurrent, mild: Secondary | ICD-10-CM | POA: Diagnosis not present

## 2016-08-01 DIAGNOSIS — G43709 Chronic migraine without aura, not intractable, without status migrainosus: Secondary | ICD-10-CM | POA: Diagnosis not present

## 2016-08-01 DIAGNOSIS — M62838 Other muscle spasm: Secondary | ICD-10-CM | POA: Diagnosis not present

## 2016-08-01 DIAGNOSIS — E1165 Type 2 diabetes mellitus with hyperglycemia: Secondary | ICD-10-CM | POA: Diagnosis not present

## 2016-08-01 MED ORDER — KETOROLAC TROMETHAMINE 30 MG/ML IJ SOLN
30.0000 mg | Freq: Once | INTRAMUSCULAR | Status: AC
  Administered 2016-08-01: 30 mg via INTRAMUSCULAR

## 2016-08-01 MED ORDER — FLUOXETINE HCL 20 MG PO CAPS: 20.0000 mg | ORAL_CAPSULE | Freq: Every day | ORAL | 1 refills | Status: DC

## 2016-08-01 MED ORDER — CYCLOBENZAPRINE HCL 10 MG PO TABS: 10.0000 mg | ORAL_TABLET | Freq: Three times a day (TID) | ORAL | 0 refills | Status: DC | PRN

## 2016-08-01 MED ORDER — SUMATRIPTAN SUCCINATE 50 MG PO TABS: ORAL_TABLET | ORAL | 0 refills | Status: DC

## 2016-08-01 MED ORDER — PROMETHAZINE HCL 25 MG/ML IJ SOLN
25.0000 mg | Freq: Once | INTRAMUSCULAR | Status: AC
  Administered 2016-08-01: 12.5 mg via INTRAMUSCULAR

## 2016-08-01 NOTE — Assessment & Plan Note (Signed)
Toradol and Phenergan injection given today in clinic for headache relief. Patient given Rx for sumatriptan for other abortive therapy. She does not have any contraindications to medication. Instructions on proper use and side effects given to patient.

## 2016-08-01 NOTE — Assessment & Plan Note (Signed)
Patient was started on Prozac at last visit. Patient with improved symptoms. PHQ-9 on today 6 which is down from 18 at last visit. Denies any SI/HI. Continue medication at current dose. Refills given.

## 2016-08-01 NOTE — Patient Instructions (Addendum)
[employer]  [place], [place], [place] [address]  Migraine medicine given Muscle relaxant given Take as prescribed No changes to depression medication

## 2016-08-01 NOTE — Assessment & Plan Note (Signed)
Distinct trapezius muscle spasm. Rx given for Flexeril. 2 other conservative therapies. Patient also given information for local massage business. Follow-up in 2 weeks if not improved.

## 2016-08-01 NOTE — Progress Notes (Signed)
     Subjective: Chief Complaint  Patient presents with  . Medication Refill  . Neck Pain    knots on neck affect right arm ROM and headaches     HPI: Theresa [name] is a 51 y.o. presenting to clinic today to discuss the following:  #Knots Noticed some knots on back of neck and shoulder Just on the left side Painful to touch  Has been present for over a month now She has tried heating pad, tylenol, muscle rubs, Aspercreme without improvement  No radiation  Has associated headaches  Denies any increased stress   #MDD Back on Prozac at '20mg'$  at last visit States she feels much better Thinks her dose is appropriate No SI/ HI PHQ-9 today 6 down from 18  #DM Last A1c 7.3 about 3 months ago Sugar 120 this AM Taking her Lantus 20U and metformin '1000mg'$  BID Denies any hypoglycemic events  #Headache Has h/o migraines currently with migraine Severity 8/10 Has taken tylenol without relief Left sided headache No associated vomiting, some nausea  ROS noted in HPI.   Past Medical, Surgical, Social, and Family History Reviewed & Updated per EMR.    History  Smoking Status  . Current Some Day Smoker  . Packs/day: 0.18  . Years: 36.00  . Types: Cigarettes  . Start date: 03/14/1978  Smokeless Tobacco  . Never Used    Comment: starting chantix, hx 1 1/2 PPD(has decrease to 2 cigs per day)    Objective: BP 122/82 (BP Location: Right Arm, Patient Position: Sitting, Cuff Size: Normal)   Pulse 77   Temp 98.4 F (36.9 C) (Oral)   Wt 207 lb 9.6 oz (94.2 kg)   SpO2 96%   BMI 37.97 kg/m  Vitals and nursing notes reviewed  Physical Exam Constitutional: She is well-developed, well-nourished, and in no distress.  Obese  HENT:  Mouth/Throat: Oropharynx is clear and moist.  Neck: decreased ROM. Muscle spasm of trapezius muscle on left side. Three distinct knots in muscle palpated and tender. No lymphadenopathy  Eyes: EOM are normal.  Blurred vision  Cardiovascular: Normal  rate and regular rhythm.   Pulmonary/Chest: Effort normal and breath sounds normal.  Skin: Skin is warm and dry.    Assessment/Plan: Please see problem based Assessment and Plan PATIENT EDUCATION PROVIDED: See AVS    Diagnosis and plan along with any newly prescribed medication(s) were discussed in detail with this patient today. The patient verbalized understanding and agreed with the plan. Patient advised if symptoms worsen return to clinic.   Meds ordered this encounter  Medications  . FLUoxetine (PROZAC) 20 MG capsule    Sig: Take 1 capsule (20 mg total) by mouth daily.    Dispense:  90 capsule    Refill:  1  . cyclobenzaprine (FLEXERIL) 10 MG tablet    Sig: Take 1 tablet (10 mg total) by mouth 3 (three) times daily as needed for muscle spasms.    Dispense:  30 tablet    Refill:  0  . SUMAtriptan (IMITREX) 50 MG tablet    Sig: Take 1 tablet ('50mg'$ ) by mouth at onset of migraine. May repeat in 2 hours if headache persists or recurs. Do not take more than 3 doses in a day.    Dispense:  15 tablet    Refill:  [place], DO 08/01/2016, 10:07 AM PGY-3, [place]

## 2016-08-01 NOTE — Addendum Note (Signed)
Addended by: Leonia Corona R on: 08/01/2016 12:24 PM   Modules accepted: Orders

## 2016-08-01 NOTE — Assessment & Plan Note (Signed)
Controlled better with metformin. Continue current therapies.

## 2016-08-08 ENCOUNTER — Other Ambulatory Visit: Payer: Self-pay | Admitting: Obstetrics and Gynecology

## 2016-08-25 ENCOUNTER — Other Ambulatory Visit: Payer: Self-pay | Admitting: Obstetrics and Gynecology

## 2016-09-02 ENCOUNTER — Other Ambulatory Visit: Payer: Self-pay | Admitting: Obstetrics and Gynecology

## 2016-09-02 MED ORDER — GABAPENTIN 300 MG PO CAPS: ORAL_CAPSULE | ORAL | 0 refills | Status: DC

## 2016-09-02 NOTE — Telephone Encounter (Signed)
Pt wants a refill on gabapentin. [facility] ep

## 2016-09-29 ENCOUNTER — Other Ambulatory Visit: Payer: Self-pay | Admitting: Obstetrics and Gynecology

## 2016-11-01 ENCOUNTER — Telehealth: Payer: Self-pay | Admitting: Family Medicine

## 2016-11-01 ENCOUNTER — Telehealth: Payer: Self-pay | Admitting: *Deleted

## 2016-11-01 NOTE — Telephone Encounter (Signed)
Patient called requesting to speak with her provider.  Patient stated she is in [place] visiting her mother.  [facility] was [facility].  Patient is reporting a rash on forehead, vision changes, dizziness, confusion, blood sugars are altered, arm and shoulder pain, and headaches are worse.  Patient think it is coming from Valsartan. Advised patient to stop the medication and go to local ED or urgent care for her symptoms.  Patient to follow up with PCP once she returns to [facility].  Derl Barrow, RN

## 2016-11-01 NOTE — Telephone Encounter (Signed)
Patient call noted.  Will discuss when patient comes for an appointment.

## 2016-11-01 NOTE — Telephone Encounter (Signed)
Patient reported via phone adverse reaction to Valsartan including headache,

## 2016-11-07 ENCOUNTER — Other Ambulatory Visit: Payer: Self-pay | Admitting: Obstetrics and Gynecology

## 2016-11-08 NOTE — Telephone Encounter (Signed)
Pt contacted and informed of medication refill to her pharmacy. Pt scheduled with pcp for 9/20.

## 2016-11-20 ENCOUNTER — Other Ambulatory Visit: Payer: Self-pay | Admitting: Internal Medicine

## 2016-11-20 ENCOUNTER — Other Ambulatory Visit: Payer: Self-pay | Admitting: Obstetrics and Gynecology

## 2016-11-21 NOTE — Telephone Encounter (Signed)
Patient has an appointment on 12-01-16. Jazmin Hartsell,CMA

## 2016-11-23 IMAGING — CT CT CHEST W/ CM
2 of 4 series · 15 of 36 positions shown, 18 images · IV contrast (omnipaque)
Comparison: Chest CT 11/12/2013

CLINICAL DATA: Subsequent treatment strategy for lung cancer. Left
upper lobe lobectomy 2060.

EXAM:
CT CHEST WITH CONTRAST
TECHNIQUE: Multidetector CT imaging of the chest was performed during
intravenous contrast administration.
CONTRAST:  80mL OMNIPAQUE IOHEXOL 300 MG/ML  SOLN

[Series 2: chest with st · axial · 0.74mm/px · z∈[-309,-59]mm · 12 of 60 slices shown, 15 images]
[im 5/60  mediastinal]
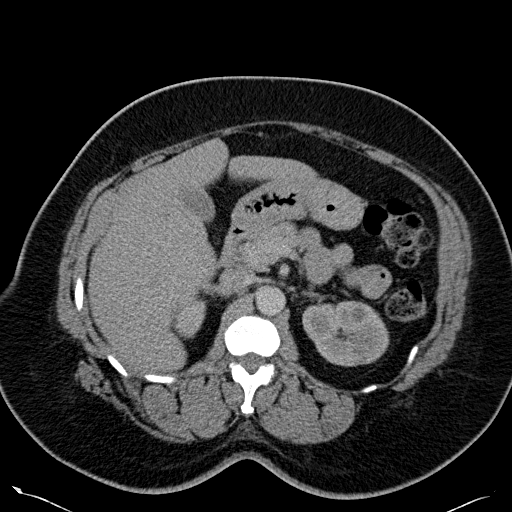
[im 5/60  lung]
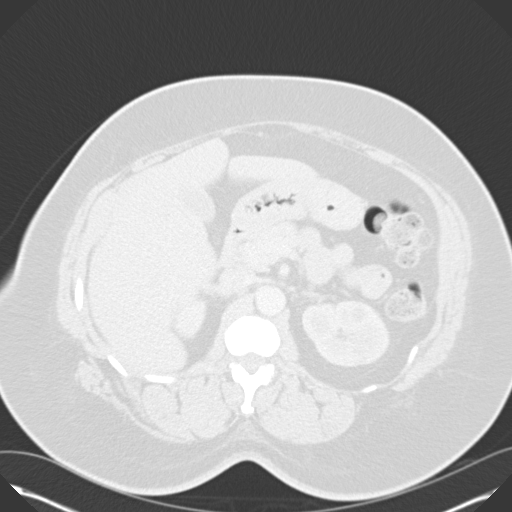
[im 9/60  lung]
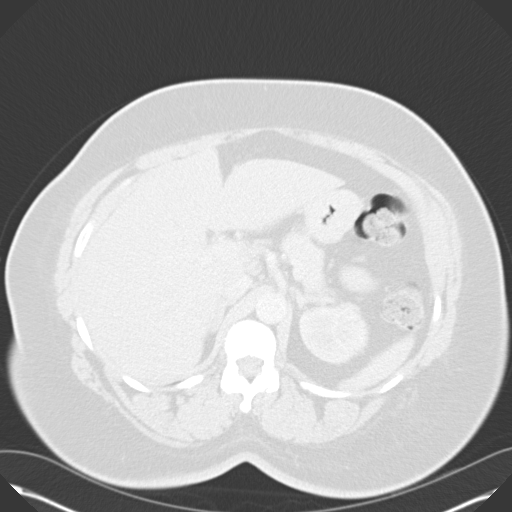
[im 13/60  lung]
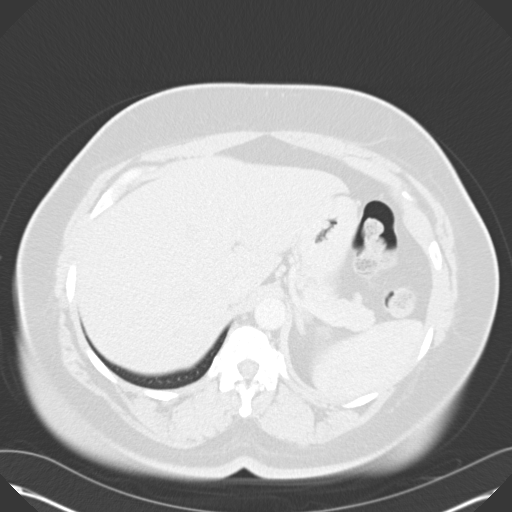
[im 17/60  lung]
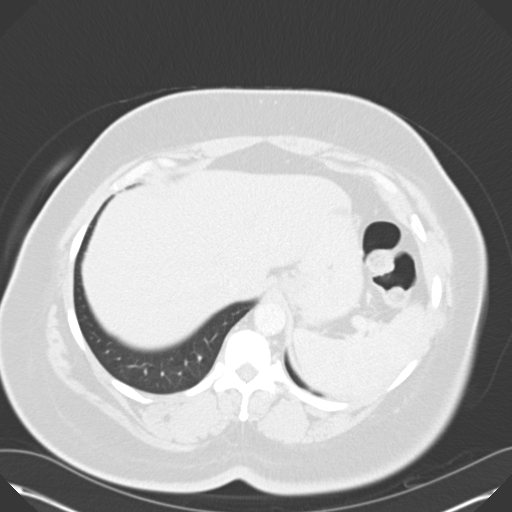
[im 22/60  mediastinal]
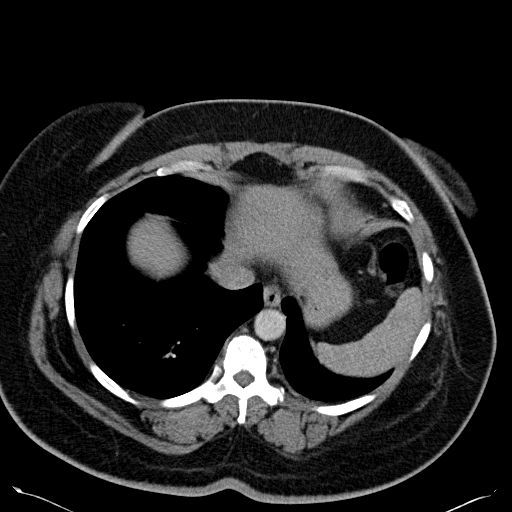
[im 22/60  lung]
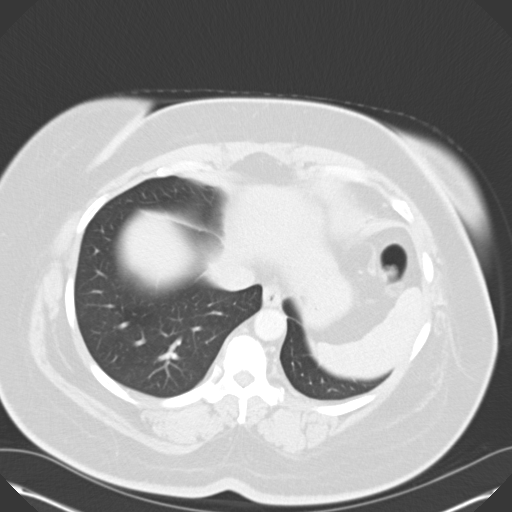
[im 26/60  lung]
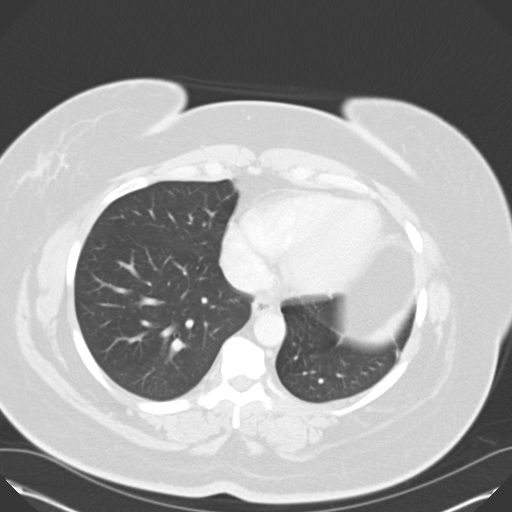
[im 34/60  lung]
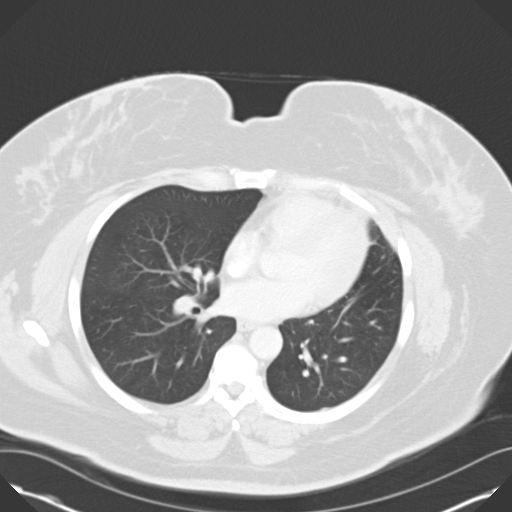
[im 38/60  lung]
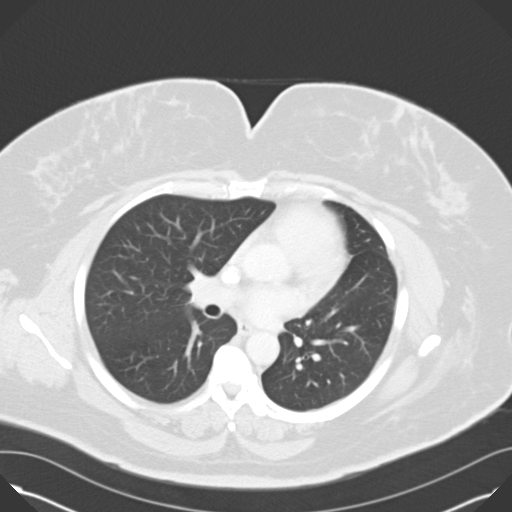
[im 43/60  mediastinal]
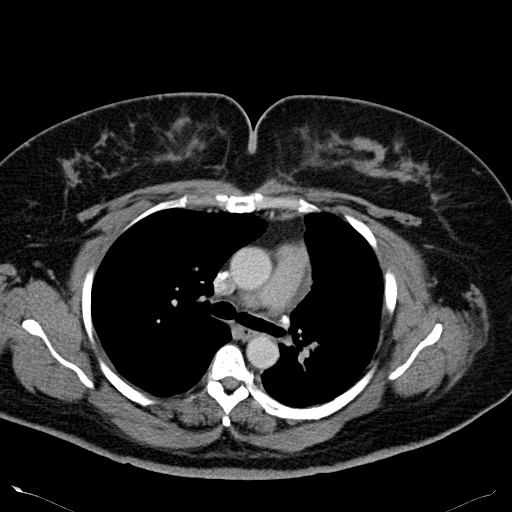
[im 43/60  lung]
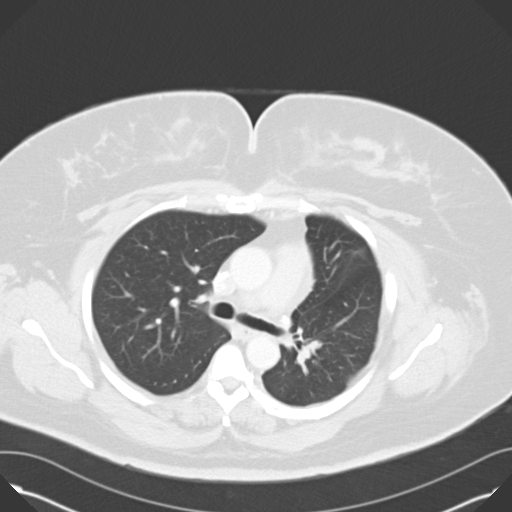
[im 47/60  lung]
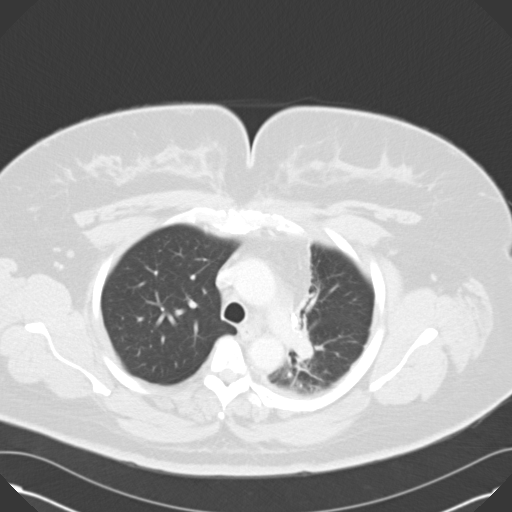
[im 51/60  lung]
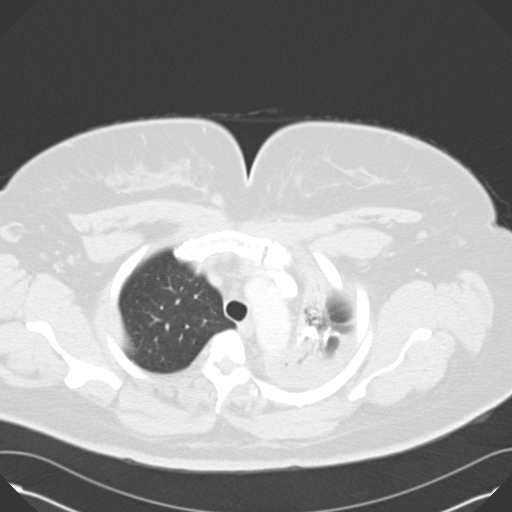
[im 55/60  lung]
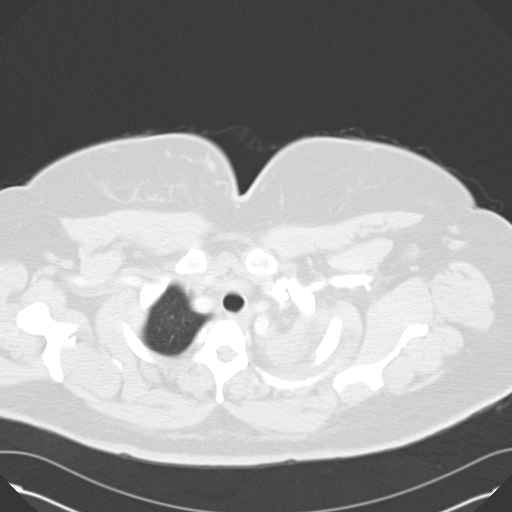

[Series 603: <mpr thick range> · coronal · 0.74mm/px · 3 of 107 slices shown]
[im 22/107  lung]
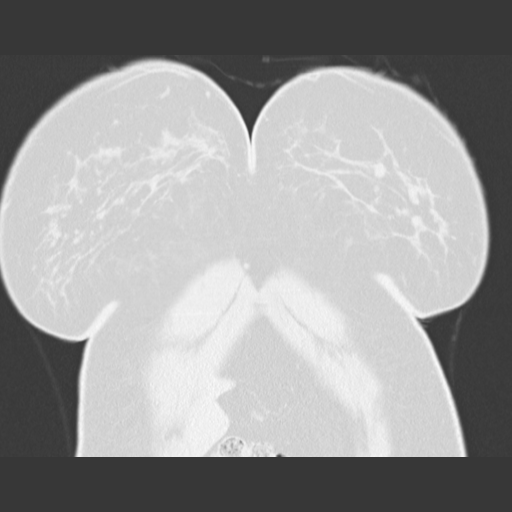
[im 43/107  lung]
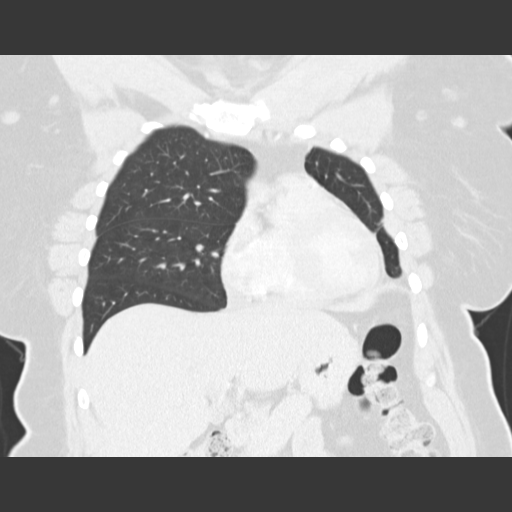
[im 64/107  lung]
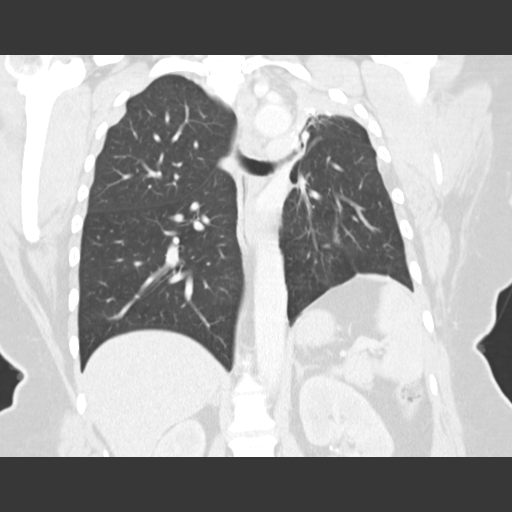

[15 of 36 positions shown; findings below may reference images not displayed]

FINDINGS: Mediastinum/Nodes: No axillary or supraclavicular lymphadenopathy.
No mediastinal hilar lymphadenopathy. No pericardial fluid.

Lungs/Pleura: Volume loss in the left hemi thorax. There is
consolidation in the left upper lobe with air bronchograms and
surgical material. This consolidative pattern which is not changed
from comparison exam. No measurable nodularity.

Upper abdomen: Limited view of the liver, kidneys, pancreas are
unremarkable. Normal adrenal glands.

Musculoskeletal: No aggressive osseous lesion.
IMPRESSION: 1. Stable consolidation in the left upper lobe at site of surgery
and radiotherapy.
2. No evidence of disease progression in the thorax.

## 2016-11-29 NOTE — Progress Notes (Signed)
Subjective:   Patient ID: Theresa [name]    DOB: [birth year], 51 y.o. female   MRN: [id number]  Theresa [name] is a 51 y.o. female with a history of Diabetes, HTN, CKD3 here for:  #Medication reaction States she has been taking valsartan for months but noticed 6 months ago she had hair coming out in chunks when washing, diffuse rash on her face, diminished sight, R breast pain, fatigue, headaches, and sinus drainage.   [facility] saying to [facility] due to [facility]. She subsequently stopped the medication about a month ago and reportedly felt better afterwards and states her confusion, vision, hair loss and fatigue has improved. She takes her BP at home but doesn't remember readings but knows they were normal.  #R breast pain Feels swollen and tender for about 6 months. Stopped Valsartan but pain persists. Pain does not fluctuate with times of the month and is more tender with palpation. Tylenol not helping No menstrual cycles - hysterectomy. Mammogram - last one not documented, states it's "been a while"  #Diabetes A1C - 7.3 05/05/16 Lantus 20u Metformin 1000mg  BID Eye exam 12/2012, has appointment next month CBGs at home 90-120  #Healthcare Maintenance Flu shot - declined Tetanus Colonoscopy - no FH of colon cancer, never had one Mammogram - no FH of breast cancer, been a while since the last one  Review of Systems:  Per HPI.   [place]: reviewed. Smoking status reviewed. Medications reviewed.  Objective:   BP 120/80   Pulse 71   Temp 98.8 F (37.1 C) (Oral)   Wt 210 lb (95.3 kg)   SpO2 95%   BMI 38.41 kg/m  Vitals and nursing note reviewed.  General: well nourished, well developed, in no acute distress with non-toxic appearance HEENT: normocephalic, atraumatic, moist mucous membranes Neck: supple, non-tender without lymphadenopathy CV: regular rate and rhythm without murmurs, rubs, or gallops Lungs: clear to auscultation bilaterally with normal work  of breathing Skin: warm, dry, no rashes or lesions Breasts: right breast normal without mass, skin or nipple changes or axillary nodes. Extremities: warm and well perfused, normal tone MSK: Full ROM, strength intact, gait normal Neuro: Alert and oriented, speech normal  Assessment & Plan:   Breast pain R breast pain persistent for 6 months. No documented mammogram, no FH of breast cancer. Breast exam wnl. - schedule diagnostic mammogram  Diabetes mellitus type 2, uncontrolled A1C today 7.3. - continue current therapies: Metformin 1000mg  BID, Lantus 20u - keep record of daily CBG - Add Lisinopril 2.5mg  for renal protection.  Essential hypertension BP 120/80 today. Patient stopped Valsartan 1 month ago due to perceived side effects and recall.   - Continue current management with Coreg 25mg  BID - Add Lisinopril 2.5mg  daily - Record BP readings at home and follow up in one month. - BMP today  Orders Placed This Encounter  Procedures  . MM Digital Diagnostic Bilat    Breast pain for 6 months    Standing Status:   Future    Standing Expiration Date:   01/31/2018    Order Specific Question:   Reason for Exam (SYMPTOM  OR DIAGNOSIS REQUIRED)    Answer:   pain    Order Specific Question:   Is the patient pregnant?    Answer:   No    Order Specific Question:   Preferred imaging location?    Answer:   [facility]  . Tdap vaccine greater than or equal to 7yo IM  .  Basic Metabolic Panel  . HgB A1c   Meds ordered this encounter  Medications  . lisinopril (PRINIVIL,ZESTRIL) 5 MG tablet    Sig: Take 0.5 tablets (2.5 mg total) by mouth daily.    Dispense:  90 tablet    Refill:  0    Rory Percy, DO PGY-1, [facility] 12/01/2016 3:55 PM

## 2016-12-01 ENCOUNTER — Encounter: Payer: Self-pay | Admitting: Family Medicine

## 2016-12-01 ENCOUNTER — Ambulatory Visit (INDEPENDENT_AMBULATORY_CARE_PROVIDER_SITE_OTHER): Payer: Medicare Other | Admitting: Family Medicine

## 2016-12-01 VITALS — BP 120/80 | HR 71 | Temp 98.8°F | Wt 210.0 lb

## 2016-12-01 DIAGNOSIS — E1165 Type 2 diabetes mellitus with hyperglycemia: Secondary | ICD-10-CM

## 2016-12-01 DIAGNOSIS — Z23 Encounter for immunization: Secondary | ICD-10-CM | POA: Diagnosis not present

## 2016-12-01 DIAGNOSIS — N644 Mastodynia: Secondary | ICD-10-CM

## 2016-12-01 DIAGNOSIS — I10 Essential (primary) hypertension: Secondary | ICD-10-CM | POA: Diagnosis not present

## 2016-12-01 DIAGNOSIS — Z794 Long term (current) use of insulin: Secondary | ICD-10-CM | POA: Diagnosis not present

## 2016-12-01 DIAGNOSIS — Z0000 Encounter for general adult medical examination without abnormal findings: Secondary | ICD-10-CM | POA: Diagnosis not present

## 2016-12-01 LAB — POCT GLYCOSYLATED HEMOGLOBIN (HGB A1C): Hemoglobin A1C: 7.3

## 2016-12-01 MED ORDER — LISINOPRIL 5 MG PO TABS: 2.5000 mg | ORAL_TABLET | Freq: Every day | ORAL | 0 refills | Status: DC

## 2016-12-01 NOTE — Assessment & Plan Note (Signed)
R breast pain persistent for 6 months. No documented mammogram, no FH of breast cancer. Breast exam wnl. - schedule diagnostic mammogram

## 2016-12-01 NOTE — Assessment & Plan Note (Addendum)
A1C today 7.3. - continue current therapies: Metformin 1000mg  BID, Lantus 20u - keep record of daily CBG - Add Lisinopril 2.5mg  for renal protection.

## 2016-12-01 NOTE — Assessment & Plan Note (Signed)
BP 120/80 today. Patient stopped Valsartan 1 month ago due to perceived side effects and recall.   - Continue current management with Coreg 25mg  BID - Add Lisinopril 2.5mg  daily - Record BP readings at home and follow up in one month. - BMP today

## 2016-12-01 NOTE — Patient Instructions (Signed)
It was great to see you!  For your Diabetes,  - We are continuing your current regimen of Lantus 20units and Metformin 1000mg  BID - We are starting Lisinopril 2.5mg  to help protect your kidneys. - Continue checking your blood sugars daily and keep a record  For your blood pressure, - We are keeping your current regimen and not restarting your Valsartan.  For your breast pain, - We are scheduling a mammogram and will let you know that appointment time.  We are checking some labs today, we will call you or send you a letter if they are abnormal.   Take care and seek immediate care sooner if you develop any concerns.   Be well, Rory Percy, DO [place]

## 2016-12-02 LAB — BASIC METABOLIC PANEL
BUN/Creatinine Ratio: 11 (ref 9–23)
BUN: 15 mg/dL (ref 6–24)
CO2: 24 mmol/L (ref 20–29)
Calcium: 9.4 mg/dL (ref 8.7–10.2)
Chloride: 103 mmol/L (ref 96–106)
Creatinine, Ser: 1.4 mg/dL — ABNORMAL HIGH (ref 0.57–1.00)
GFR calc Af Amer: 49 mL/min/{1.73_m2} — ABNORMAL LOW (ref >59)
GFR calc non Af Amer: 43 mL/min/{1.73_m2} — ABNORMAL LOW (ref >59)
Glucose: 106 mg/dL — ABNORMAL HIGH (ref 65–99)
Potassium: 4.2 mmol/L (ref 3.5–5.2)
Sodium: 141 mmol/L (ref 134–144)

## 2016-12-07 ENCOUNTER — Other Ambulatory Visit: Payer: Self-pay | Admitting: Family Medicine

## 2016-12-07 DIAGNOSIS — N644 Mastodynia: Secondary | ICD-10-CM

## 2016-12-08 ENCOUNTER — Other Ambulatory Visit: Payer: Self-pay | Admitting: Obstetrics and Gynecology

## 2016-12-08 DIAGNOSIS — E785 Hyperlipidemia, unspecified: Secondary | ICD-10-CM

## 2016-12-08 DIAGNOSIS — I10 Essential (primary) hypertension: Secondary | ICD-10-CM

## 2016-12-14 ENCOUNTER — Telehealth: Payer: Self-pay | Admitting: *Deleted

## 2016-12-14 NOTE — Telephone Encounter (Signed)
Will forward to MD to check box before leaving today to see if form arrived. Jazmin Hartsell,CMA

## 2016-12-14 NOTE — Telephone Encounter (Signed)
Form completed and placed up front to be faxed.

## 2016-12-14 NOTE — Telephone Encounter (Signed)
Patient called stating a form will be faxed to provider regarding approval for CPAP mask.  Form should be faxed sometime today.  Please complete the form and fax back ASAP.  Please call patient with questions. Number on file is correct.  Derl Barrow, RN

## 2016-12-15 ENCOUNTER — Other Ambulatory Visit: Payer: Medicare Other

## 2016-12-15 ENCOUNTER — Inpatient Hospital Stay: Admission: RE | Admit: 2016-12-15 | Payer: Medicare Other | Source: Ambulatory Visit

## 2017-01-04 ENCOUNTER — Encounter (HOSPITAL_COMMUNITY): Payer: Self-pay | Admitting: Emergency Medicine

## 2017-01-04 ENCOUNTER — Ambulatory Visit (HOSPITAL_COMMUNITY)
Admission: EM | Admit: 2017-01-04 | Discharge: 2017-01-04 | Disposition: A | Discharge status: Nursing Home (Includes ICF) | Payer: Medicare Other | Attending: Emergency Medicine | Admitting: Emergency Medicine

## 2017-01-04 DIAGNOSIS — R3915 Urgency of urination: Secondary | ICD-10-CM | POA: Diagnosis not present

## 2017-01-04 DIAGNOSIS — R1033 Periumbilical pain: Secondary | ICD-10-CM | POA: Diagnosis not present

## 2017-01-04 LAB — POCT URINALYSIS DIP (DEVICE)
Bilirubin Urine: NEGATIVE
Glucose, UA: NEGATIVE mg/dL
Ketones, ur: NEGATIVE mg/dL
Leukocytes, UA: NEGATIVE
Nitrite: NEGATIVE
Protein, ur: 30 mg/dL — AB
Specific Gravity, Urine: 1.015 (ref 1.005–1.030)
Urobilinogen, UA: 0.2 mg/dL (ref 0.0–1.0)
pH: 7 (ref 5.0–8.0)

## 2017-01-04 NOTE — Discharge Instructions (Signed)
Go immediately to the emergency department. Left them know if your abdominal pain changes or gets worse.

## 2017-01-04 NOTE — ED Provider Notes (Signed)
HPI  SUBJECTIVE:  Theresa [name] is a 51 y.o. female who presents with 3 days of constant upper abdominal pain with radiation to her back. She states that it is dull, achy, feels like fullness. She reports nausea, abdominal distention. States she is still passing gas.  Last bowel movement 2 days ago. She reports urinary urgency and perhaps cloudy urine. She reports shortness of breath secondary to pain, increased belching. She tried Tylenol, heat, omeprazole without improvement in her symptoms. Symptoms are slightly better with defecation, worse with eating, moving. No vomiting, fevers, anorexia. No dysuria, frequency, odorous urine, hematuria. No chest pain. No water brash. States that the car ride over here was painful. Last dose of Tylenol was 4 hours prior to evaluation. She has an extensive medical history including diabetes, states that her glucose is running within normal limits. Also history of chronic kidney disease stage III, hypertension, lung cancer, GERD, IBS. No history of gastroparesis, alcohol abuse, UTI, pyelonephritis, nephrolithiasis. She is status post C-section and partial hysterectomy. No history of pancreatitis, gallbladder disease. PMD: [facility].    Past Medical History:  Diagnosis Date  . Anxiety   . Arthritis   . Blindness, legal    glaucoma and retinitis  pigmentosa  . Chronic eczema   . Chronic headaches 10-17-11   not migraines- just regular  . Constipation   . Depression   . Diabetes mellitus type II    diet   . Family history of anesthesia complication    1 son had malignant hyperthermia at 7yrs  tonsils rained  . Family history of malignant hyperthermia    1 son 16 years ago  . Fibromyalgia    denies, has not been diagnosited  . GERD (gastroesophageal reflux disease)   . Glaucoma   . History of chemotherapy 01/2011 to 03/2011   concurrent w/radiation therapy  . Hx of radiation therapy 01-17-11 to 03-18-11   lung  . Hyperlipidemia   .  Hypertension   . Lung cancer ([place])    lung ca dx 2010  . Malignant hyperthermia    As of 12/30/15, no personal MH history, but reported her son has a history of MH ~ 2000 (no confirmatory testing done)  . Onychomycosis   . Retinitis pigmentosa   . Shortness of breath    with exertion  . Sleep apnea    cpap 5 yrs    Past Surgical History:  Procedure Laterality Date  . ABDOMINAL HYSTERECTOMY  09/19/2001   hx. of fibroids. TAH. Ovaries remain.   [facility] Kitchen BACK SURGERY  06/2013  . CHEST TUBE INSERTION   04/01/08   L hydropneumothorax  . EYE SURGERY Bilateral    lens implant  . KNEE ARTHROSCOPY  10-17-11   bil. knee scope(left was torn ligament)  . LUMBAR LAMINECTOMY/DECOMPRESSION MICRODISCECTOMY  10/27/2011   Procedure: LUMBAR LAMINECTOMY/DECOMPRESSION MICRODISCECTOMY;  Surgeon: Johnn Hai, MD;  Location: [facility];  Service: Orthopedics;  Laterality: N/A;  L4-L5  . lung surgery  03/24/08   L vats, L thoracotomy and LUL trisegmentectomy with node dissection  . MAXIMUM ACCESS (MAS)POSTERIOR LUMBAR INTERBODY FUSION (PLIF) 1 LEVEL N/A 07/03/2013   Procedure: FOR MAXIMUM ACCESS (MAS) POSTERIOR LUMBAR INTERBODY FUSION (PLIF) 1 LEVEL;  Surgeon: Eustace Moore, MD;  Location: [place] [facility];  Service: Neurosurgery;  Laterality: N/A;  FOR MAXIMUM ACCESS (MAS) POSTERIOR LUMBAR INTERBODY FUSION (PLIF) 1 LEVEL (L4-L5)  . PORT-A-CATH REMOVAL Right 11/26/2013   Procedure: REMOVAL PORT-A-CATH;  Surgeon: Gaye Pollack,  MD;  Location: [place];  Service: Thoracic;  Laterality: Right;  . PORTACATH PLACEMENT  02/11/2011   Procedure: INSERTION PORT-A-CATH;  Surgeon: Pierre Bali, MD;  Location: [place];  Service: Thoracic;  Laterality: Right;  9.6Fr. Pre-attached Power Port in Right Internal Jugular  -right chest-remains inplace 10-17-11  . SPINAL CORD STIMULATOR INSERTION N/A 02/03/2016   Procedure: LUMBAR SPINAL CORD STIMULATOR INSERTION;  Surgeon: Eustace Moore, MD;  Location: [place];  Service: Neurosurgery;  Laterality:  N/A;    Family History  Problem Relation Age of Onset  . Brain cancer Maternal Aunt   . Depression Mother   . Heart disease Mother   . Depression Son   . Diabetes Maternal Grandmother   . Stroke Maternal Grandmother   . Depression Maternal Grandmother   . Depression Daughter   . Malignant hyperthermia Son 16       tonsils drained    Social History  Substance Use Topics  . Smoking status: Current Some Day Smoker    Packs/day: 0.18    Years: 36.00    Types: Cigarettes    Start date: 03/14/1978  . Smokeless tobacco: Never Used     Comment: starting chantix, hx 1 1/2 PPD(has decrease to 2 cigs per day)  . Alcohol use No    No current facility-administered medications for this encounter.   Current Outpatient Prescriptions:  .  albuterol (PROVENTIL HFA;VENTOLIN HFA) 108 (90 Base) MCG/ACT inhaler, Inhale 2 puffs into the lungs every 4 (four) hours as needed for wheezing or shortness of breath., Disp: 1 Inhaler, Rfl: 1 .  amLODipine (NORVASC) 10 MG tablet, TAKE 1 TABLET BY MOUTH EVERY DAY, FOLLOW UP WITH PCP FOR REFILLS, Disp: 90 tablet, Rfl: 3 .  aspirin EC 81 MG tablet, Take 1 tablet (81 mg total) by mouth daily., Disp: 30 tablet, Rfl: 11 .  atorvastatin (LIPITOR) 40 MG tablet, Take 1 tablet (40 mg total) by mouth daily., Disp: 90 tablet, Rfl: 3 .  carvedilol (COREG) 25 MG tablet, TAKE 1 TABLET (25 MG TOTAL) BY MOUTH 2 (TWO) TIMES DAILY WITH A MEAL., Disp: 180 tablet, Rfl: 1 .  clobetasol cream (TEMOVATE) 1.75 %, Apply 1 application topically 2 (two) times daily., Disp: 60 g, Rfl: 1 .  cyclobenzaprine (FLEXERIL) 10 MG tablet, TAKE 1 TABLET BY MOUTH THREE TIMES A DAY AS NEEDED FOR MUSCLE SPASMS, Disp: 30 tablet, Rfl: 0 .  FLUoxetine (PROZAC) 20 MG capsule, Take 1 capsule (20 mg total) by mouth daily., Disp: 90 capsule, Rfl: 1 .  gabapentin (NEURONTIN) 300 MG capsule, TAKE 1 CAPSULE BY MOUTH THREE TIMES DAILY, Disp: 90 capsule, Rfl: 0 .  Insulin Glargine (LANTUS SOLOSTAR) 100 UNIT/ML  Solostar Pen, INJECT 20 UNITS EVERY MORNING, Disp: 15 pen, Rfl: 2 .  Insulin Pen Needle (PEN NEEDLES) 32G X 4 MM MISC, 1 Stick by Does not apply route daily., Disp: 100 each, Rfl: 1 .  linaclotide (LINZESS) 290 MCG CAPS capsule, Take 1 capsule (290 mcg total) by mouth daily. Follow-up with PCP for further refills, Disp: 30 capsule, Rfl: 1 .  lisinopril (PRINIVIL,ZESTRIL) 5 MG tablet, Take 0.5 tablets (2.5 mg total) by mouth daily., Disp: 90 tablet, Rfl: 0 .  metFORMIN (GLUCOPHAGE-XR) 500 MG 24 hr tablet, TAKE 2 TABLETS (1,000 MG TOTAL) BY MOUTH 2 (TWO) TIMES DAILY., Disp: 360 tablet, Rfl: 0 .  ranitidine (ZANTAC) 150 MG tablet, Take 1 tablet (150 mg total) by mouth 2 (two) times daily., Disp: 60 tablet, Rfl: 3 .  sucralfate (CARAFATE) 1 GM/10ML suspension, Take 10 mLs (1 g total) by mouth 4 (four) times daily -  with meals and at bedtime., Disp: 420 mL, Rfl: 0 .  SUMAtriptan (IMITREX) 50 MG tablet, TAKE 1 TABLET BY MOUTH AT ONSET OF MIGRAINE. MAY REPEAT IN 2 HOURS IF HEADACHE PERSISTS. MAX 3/DAY, Disp: 15 tablet, Rfl: 0 .  timolol (TIMOPTIC) 0.5 % ophthalmic solution, Place 1 drop into both eyes 2 (two) times daily., Disp: , Rfl:  .  Travoprost, BAK Free, (TRAVATAN) 0.004 % SOLN ophthalmic solution, Place 1 drop into both eyes at bedtime., Disp: , Rfl:  .  varenicline (CHANTIX) 1 MG tablet, Take 1 tablet (1 mg total) by mouth 2 (two) times daily., Disp: 60 tablet, Rfl: 1  Allergies  Allergen Reactions  . Penicillins Hives and Rash    Has patient had a PCN reaction causing immediate rash, facial/tongue/throat swelling, SOB or lightheadedness with hypotension:   # # YES # #   Has patient had a PCN reaction causing severe rash involving mucus membranes or skin necrosis:  # # NO # #  Has patient had a PCN reaction that required hospitalization  # # NO # #  Has patient had a PCN reaction occurring within the last 10 years:  # # NO # #  If all of the above answers are "NO", then may proceed with  Cephalosporin use.   . Codeine Nausea And Vomiting  . Morphine And Related Nausea Only  . Tomato Hives and Itching     ROS  As noted in HPI.   Physical Exam  BP (!) 154/89   Pulse 73   Temp 98.9 F (37.2 C) (Oral)   Resp 16   Wt 210 lb (95.3 kg)   SpO2 100%   BMI 38.41 kg/m   Constitutional: Well developed, well nourished, no acute distress Eyes:  EOMI, conjunctiva normal bilaterally HENT: Normocephalic, atraumatic,mucus membranes moist Respiratory: Normal inspiratory effort, lungs clear bilaterally Cardiovascular: Normal rate regular rhythm no murmurs rubs or gallops GI: nondistended soft, positive exquisite periumbilical tenderness and left upper quadrant tenderness. No rebound, guarding. Negative Murphy. Negative McBurney. No suprapubic tenderness. Positive tap Table test. skin: No rash, skin intact Musculoskeletal: no deformities Neurologic: Alert & oriented x 3, no focal neuro deficits Psychiatric: Speech and behavior appropriate   ED Course   Medications - No data to display  Orders Placed This Encounter  Procedures  . POCT urinalysis dip (device)    Standing Status:   Standing    Number of Occurrences:   1    Results for orders placed or performed during the hospital encounter of 01/04/17 (from the past 24 hour(s))  POCT urinalysis dip (device)     Status: Abnormal   Collection Time: 01/04/17  8:00 PM  Result Value Ref Range   Glucose, UA NEGATIVE NEGATIVE mg/dL   Bilirubin Urine NEGATIVE NEGATIVE   Ketones, ur NEGATIVE NEGATIVE mg/dL   Specific Gravity, Urine 1.015 1.005 - 1.030   Hgb urine dipstick TRACE (A) NEGATIVE   pH 7.0 5.0 - 8.0   Protein, ur 30 (A) NEGATIVE mg/dL   Urobilinogen, UA 0.2 0.0 - 1.0 mg/dL   Nitrite NEGATIVE NEGATIVE   Leukocytes, UA NEGATIVE NEGATIVE   No results found.  ED Clinical Impression  Periumbilical abdominal pain   ED Assessment/Plan  trace hematuria, proteinuria. No UTI.  Concern for pancreatitis vs  perforation. She does have some peritoneal signs, multiple medical comorbidities. Transferring to the ED for comprehensive  workup. Feel that she is stable to go by private vehicle. Discussed medical decision-making, rationale for transfer to the emergency department. Patient agrees with plan.   No orders of the defined types were placed in this encounter.   *This clinic note was created using Dragon dictation software. Therefore, there may be occasional mistakes despite careful proofreading.  ?   Melynda Ripple, MD 01/04/17 2040

## 2017-01-04 NOTE — ED Triage Notes (Signed)
PT reports history of IBS. PT reports abdominal pain and lower back pain that started Sunday. PT reports pain is in LUQ. PT reports she feels bloated. PT has tried omeprazole and Gas X. PT reports last BM was Monday.

## 2017-01-05 ENCOUNTER — Emergency Department (HOSPITAL_COMMUNITY)
Admission: EM | Admit: 2017-01-05 | Discharge: 2017-01-05 | Disposition: A | Discharge status: Home / Self Care | Payer: Medicare Other | Attending: Emergency Medicine | Admitting: Emergency Medicine

## 2017-01-05 ENCOUNTER — Emergency Department (HOSPITAL_COMMUNITY): Payer: Medicare Other

## 2017-01-05 ENCOUNTER — Encounter (HOSPITAL_COMMUNITY): Payer: Self-pay | Admitting: *Deleted

## 2017-01-05 DIAGNOSIS — Z880 Allergy status to penicillin: Secondary | ICD-10-CM | POA: Insufficient documentation

## 2017-01-05 DIAGNOSIS — Z85118 Personal history of other malignant neoplasm of bronchus and lung: Secondary | ICD-10-CM | POA: Insufficient documentation

## 2017-01-05 DIAGNOSIS — Z794 Long term (current) use of insulin: Secondary | ICD-10-CM | POA: Diagnosis not present

## 2017-01-05 DIAGNOSIS — Z885 Allergy status to narcotic agent status: Secondary | ICD-10-CM | POA: Diagnosis not present

## 2017-01-05 DIAGNOSIS — Z7982 Long term (current) use of aspirin: Secondary | ICD-10-CM | POA: Insufficient documentation

## 2017-01-05 DIAGNOSIS — E1122 Type 2 diabetes mellitus with diabetic chronic kidney disease: Secondary | ICD-10-CM | POA: Insufficient documentation

## 2017-01-05 DIAGNOSIS — I129 Hypertensive chronic kidney disease with stage 1 through stage 4 chronic kidney disease, or unspecified chronic kidney disease: Secondary | ICD-10-CM | POA: Diagnosis not present

## 2017-01-05 DIAGNOSIS — Z79899 Other long term (current) drug therapy: Secondary | ICD-10-CM | POA: Insufficient documentation

## 2017-01-05 DIAGNOSIS — F17210 Nicotine dependence, cigarettes, uncomplicated: Secondary | ICD-10-CM | POA: Diagnosis not present

## 2017-01-05 DIAGNOSIS — N183 Chronic kidney disease, stage 3 (moderate): Secondary | ICD-10-CM | POA: Insufficient documentation

## 2017-01-05 DIAGNOSIS — K2900 Acute gastritis without bleeding: Secondary | ICD-10-CM | POA: Insufficient documentation

## 2017-01-05 DIAGNOSIS — R1012 Left upper quadrant pain: Secondary | ICD-10-CM | POA: Diagnosis present

## 2017-01-05 LAB — COMPREHENSIVE METABOLIC PANEL
ALT: 16 U/L (ref 14–54)
AST: 17 U/L (ref 15–41)
Albumin: 4.1 g/dL (ref 3.5–5.0)
Alkaline Phosphatase: 56 U/L (ref 38–126)
Anion gap: 12 (ref 5–15)
BUN: 10 mg/dL (ref 6–20)
CO2: 25 mmol/L (ref 22–32)
Calcium: 9.2 mg/dL (ref 8.9–10.3)
Chloride: 100 mmol/L — ABNORMAL LOW (ref 101–111)
Creatinine, Ser: 1.37 mg/dL — ABNORMAL HIGH (ref 0.44–1.00)
GFR calc Af Amer: 50 mL/min — ABNORMAL LOW (ref >60)
GFR calc non Af Amer: 43 mL/min — ABNORMAL LOW (ref >60)
Glucose, Bld: 164 mg/dL — ABNORMAL HIGH (ref 65–99)
Potassium: 3.4 mmol/L — ABNORMAL LOW (ref 3.5–5.1)
Sodium: 137 mmol/L (ref 135–145)
Total Bilirubin: 1.1 mg/dL (ref 0.3–1.2)
Total Protein: 7.1 g/dL (ref 6.5–8.1)

## 2017-01-05 LAB — CBC WITH DIFFERENTIAL/PLATELET
Basophils Absolute: 0 10*3/uL (ref 0.0–0.1)
Basophils Relative: 0 %
Eosinophils Absolute: 0.3 10*3/uL (ref 0.0–0.7)
Eosinophils Relative: 4 %
HCT: 46.1 % — ABNORMAL HIGH (ref 36.0–46.0)
Hemoglobin: 15.8 g/dL — ABNORMAL HIGH (ref 12.0–15.0)
Lymphocytes Relative: 23 %
Lymphs Abs: 1.6 10*3/uL (ref 0.7–4.0)
MCH: 30.9 pg (ref 26.0–34.0)
MCHC: 34.3 g/dL (ref 30.0–36.0)
MCV: 90 fL (ref 78.0–100.0)
Monocytes Absolute: 0.8 10*3/uL (ref 0.1–1.0)
Monocytes Relative: 11 %
Neutro Abs: 4.4 10*3/uL (ref 1.7–7.7)
Neutrophils Relative %: 62 %
Platelets: 207 10*3/uL (ref 150–400)
RBC: 5.12 MIL/uL — ABNORMAL HIGH (ref 3.87–5.11)
RDW: 13.2 % (ref 11.5–15.5)
WBC: 7.1 10*3/uL (ref 4.0–10.5)

## 2017-01-05 LAB — I-STAT TROPONIN, ED: Troponin i, poc: 0 ng/mL (ref 0.00–0.08)

## 2017-01-05 LAB — LIPASE, BLOOD: Lipase: 300 U/L — ABNORMAL HIGH (ref 11–51)

## 2017-01-05 MED ORDER — HYDROCODONE-ACETAMINOPHEN 5-325 MG PO TABS: 1.0000 | ORAL_TABLET | Freq: Four times a day (QID) | ORAL | 0 refills | Status: DC | PRN

## 2017-01-05 MED ORDER — IOPAMIDOL (ISOVUE-300) INJECTION 61%
INTRAVENOUS | Status: AC
  Administered 2017-01-05: 100 mL
  Filled 2017-01-05: qty 75

## 2017-01-05 MED ORDER — FENTANYL CITRATE (PF) 100 MCG/2ML IJ SOLN
50.0000 ug | Freq: Once | INTRAMUSCULAR | Status: AC
  Administered 2017-01-05: 50 ug via INTRAVENOUS
  Filled 2017-01-05: qty 2

## 2017-01-05 MED ORDER — ONDANSETRON HCL 4 MG/2ML IJ SOLN
4.0000 mg | Freq: Once | INTRAMUSCULAR | Status: AC
  Administered 2017-01-05: 4 mg via INTRAVENOUS
  Filled 2017-01-05: qty 2

## 2017-01-05 MED ORDER — GI COCKTAIL ~~LOC~~
30.0000 mL | Freq: Once | ORAL | Status: AC
  Administered 2017-01-05: 30 mL via ORAL
  Filled 2017-01-05: qty 30

## 2017-01-05 MED ORDER — SUCRALFATE 1 GM/10ML PO SUSP
1.0000 g | Freq: Three times a day (TID) | ORAL | 0 refills | Status: DC
Start: 2017-01-05 — End: 2017-01-18

## 2017-01-05 MED ORDER — PANTOPRAZOLE SODIUM 40 MG IV SOLR
40.0000 mg | Freq: Once | INTRAVENOUS | Status: AC
  Administered 2017-01-05: 40 mg via INTRAVENOUS
  Filled 2017-01-05: qty 40

## 2017-01-05 MED ORDER — OMEPRAZOLE 20 MG PO CPDR: 20.0000 mg | DELAYED_RELEASE_CAPSULE | Freq: Every day | ORAL | 0 refills | Status: DC

## 2017-01-05 MED ORDER — SODIUM CHLORIDE 0.9 % IV BOLUS (SEPSIS)
500.0000 mL | Freq: Once | INTRAVENOUS | Status: AC
  Administered 2017-01-05: 500 mL via INTRAVENOUS

## 2017-01-05 NOTE — ED Notes (Signed)
Patient transported to CT 

## 2017-01-05 NOTE — ED Triage Notes (Signed)
To ED for eval of LUQ pain since Sunday. Seen last pm at [facility] and told to come to ED today for further eval. No vomiting. No diarrhea. Pt states she has IBS and without meds she can't have BM. Pain increases after eating solid foods.

## 2017-01-05 NOTE — ED Provider Notes (Signed)
[facility] Provider Note   CSN: [id number] Arrival date & time: 01/05/17  0708     History   Chief Complaint Chief Complaint  Patient presents with  . Abdominal Pain    HPI Theresa [name] is a 51 y.o. female.  The history is provided by the patient. No language interpreter was used.  Abdominal Pain      Theresa [name] is a 51 y.o. female who presents to the Emergency Department complaining of abdominal pain.  She reports 4 days of left upper quadrant pain.  Pain is described as dull and aching in nature.  It is constant but worse with meals or movement.  She has associated nausea.  At times radiates to the right upper quadrant as well as the back.  No fevers, vomiting, diarrhea, dysuria.  She does have chronic constipation, unchanged from baseline.  She has a history of prior hysterectomy, no additional abdominal surgeries.  Symptoms are moderate and constant in nature.  Past Medical History:  Diagnosis Date  . Anxiety   . Arthritis   . Blindness, legal    glaucoma and retinitis  pigmentosa  . Chronic eczema   . Chronic headaches 10-17-11   not migraines- just regular  . Constipation   . Depression   . Diabetes mellitus type II    diet   . Family history of anesthesia complication    1 son had malignant hyperthermia at 62yrs  tonsils rained  . Family history of malignant hyperthermia    1 son 16 years ago  . Fibromyalgia    denies, has not been diagnosited  . GERD (gastroesophageal reflux disease)   . Glaucoma   . History of chemotherapy 01/2011 to 03/2011   concurrent w/radiation therapy  . Hx of radiation therapy 01-17-11 to 03-18-11   lung  . Hyperlipidemia   . Hypertension   . Lung cancer ([place])    lung ca dx 2010  . Malignant hyperthermia    As of 12/30/15, no personal MH history, but reported her son has a history of MH ~ 2000 (no confirmatory testing done)  . Onychomycosis   . Retinitis pigmentosa   . Shortness of  breath    with exertion  . Sleep apnea    cpap 5 yrs    Patient Active Problem List   Diagnosis Date Noted  . Breast pain 12/01/2016  . Trapezius muscle spasm 08/01/2016  . S/P insertion of spinal cord stimulator 02/03/2016  . Polycythemia, secondary 11/24/2013  . S/P lumbar spinal fusion 07/03/2013  . Obstructive sleep apnea 12/21/2012  . Hirsutism 12/13/2011  . Blindness, legal   . Migraine 01/24/2011  . Bronchogenic cancer of left lung ([place]) 01/20/2011  . Glaucoma   . Retinitis pigmentosa   . Chronic eczema   . Neuropathic pain 09/29/2010  . Chronic kidney disease (CKD), stage III (moderate) ([place]) 05/17/2010  . Hypercholesterolemia 04/17/2009  . Essential hypertension 04/17/2009  . Morbid obesity ([place]) 10/02/2008  . Diabetes mellitus type 2, uncontrolled ([place]) 02/01/2007  . Major depressive disorder, recurrent episode ([place]) 05/11/2006  . TOBACCO DEPENDENCE 05/11/2006    Past Surgical History:  Procedure Laterality Date  . ABDOMINAL HYSTERECTOMY  09/19/2001   hx. of fibroids. TAH. Ovaries remain.   Marland Kitchen BACK SURGERY  06/2013  . CHEST TUBE INSERTION   04/01/08   L hydropneumothorax  . EYE SURGERY Bilateral    lens implant  . KNEE ARTHROSCOPY  10-17-11   bil. knee scope(left  was torn ligament)  . LUMBAR LAMINECTOMY/DECOMPRESSION MICRODISCECTOMY  10/27/2011   Procedure: LUMBAR LAMINECTOMY/DECOMPRESSION MICRODISCECTOMY;  Surgeon: Johnn Hai, MD;  Location: [facility];  Service: Orthopedics;  Laterality: N/A;  L4-L5  . lung surgery  03/24/08   L vats, L thoracotomy and LUL trisegmentectomy with node dissection  . MAXIMUM ACCESS (MAS)POSTERIOR LUMBAR INTERBODY FUSION (PLIF) 1 LEVEL N/A 07/03/2013   Procedure: FOR MAXIMUM ACCESS (MAS) POSTERIOR LUMBAR INTERBODY FUSION (PLIF) 1 LEVEL;  Surgeon: Eustace Moore, MD;  Location: [facility];  Service: Neurosurgery;  Laterality: N/A;  FOR MAXIMUM ACCESS (MAS) POSTERIOR LUMBAR INTERBODY FUSION (PLIF) 1 LEVEL (L4-L5)  . PORT-A-CATH REMOVAL  Right 11/26/2013   Procedure: REMOVAL PORT-A-CATH;  Surgeon: Gaye Pollack, MD;  Location: [facility];  Service: Thoracic;  Laterality: Right;  . PORTACATH PLACEMENT  02/11/2011   Procedure: INSERTION PORT-A-CATH;  Surgeon: Pierre Bali, MD;  Location: [facility];  Service: Thoracic;  Laterality: Right;  9.6Fr. Pre-attached Power Port in Right Internal Jugular  -right chest-remains inplace 10-17-11  . SPINAL CORD STIMULATOR INSERTION N/A 02/03/2016   Procedure: LUMBAR SPINAL CORD STIMULATOR INSERTION;  Surgeon: Eustace Moore, MD;  Location: [facility];  Service: Neurosurgery;  Laterality: N/A;    OB History    No data available       Home Medications    Prior to Admission medications   Medication Sig Start Date End Date Taking? Authorizing Provider  albuterol (PROVENTIL HFA;VENTOLIN HFA) 108 (90 Base) MCG/ACT inhaler Inhale 2 puffs into the lungs every 4 (four) hours as needed for wheezing or shortness of breath. 05/05/16  Yes Phelps, Jazma Y, DO  amLODipine (NORVASC) 10 MG tablet TAKE 1 TABLET BY MOUTH EVERY DAY, FOLLOW UP WITH PCP FOR REFILLS 05/05/16  Yes Luiz Blare Y, DO  aspirin EC 81 MG tablet Take 1 tablet (81 mg total) by mouth daily. 01/08/16  Yes Luiz Blare Y, DO  atorvastatin (LIPITOR) 40 MG tablet Take 1 tablet (40 mg total) by mouth daily. 05/05/16  Yes Luiz Blare Y, DO  carvedilol (COREG) 25 MG tablet TAKE 1 TABLET (25 MG TOTAL) BY MOUTH 2 (TWO) TIMES DAILY WITH A MEAL. 12/08/16  Yes Rory Percy, DO  clobetasol cream (TEMOVATE) 4.09 % Apply 1 application topically 2 (two) times daily. 05/05/16  Yes Phelps, Aviva Signs Y, DO  cyclobenzaprine (FLEXERIL) 10 MG tablet TAKE 1 TABLET BY MOUTH THREE TIMES A DAY AS NEEDED FOR MUSCLE SPASMS 08/25/16  Yes Mayo, Pete Pelt, MD  FLUoxetine (PROZAC) 20 MG capsule Take 1 capsule (20 mg total) by mouth daily. 08/01/16  Yes Luiz Blare Y, DO  gabapentin (NEURONTIN) 300 MG capsule TAKE 1 CAPSULE BY MOUTH THREE TIMES DAILY 11/21/16 01/05/17 Yes Rory Percy, DO  Insulin Glargine (LANTUS SOLOSTAR) 100 UNIT/ML Solostar Pen INJECT 20 UNITS EVERY MORNING 05/05/16  Yes Luiz Blare Y, DO  Insulin Pen Needle (PEN NEEDLES) 32G X 4 MM MISC 1 Stick by Does not apply route daily. 05/06/16  Yes Katheren Shams, DO  linaclotide (LINZESS) 290 MCG CAPS capsule Take 1 capsule (290 mcg total) by mouth daily. Follow-up with PCP for further refills 07/12/16  Yes Luiz Blare Y, DO  lisinopril (PRINIVIL,ZESTRIL) 5 MG tablet Take 0.5 tablets (2.5 mg total) by mouth daily. 12/01/16  Yes Rory Percy, DO  metFORMIN (GLUCOPHAGE-XR) 500 MG 24 hr tablet TAKE 2 TABLETS (1,000 MG TOTAL) BY MOUTH 2 (TWO) TIMES DAILY. 11/07/16  Yes Rory Percy, DO  ranitidine (ZANTAC) 150 MG tablet Take  1 tablet (150 mg total) by mouth 2 (two) times daily. 01/08/16  Yes Phelps, Aviva Signs Y, DO  SUMAtriptan (IMITREX) 50 MG tablet TAKE 1 TABLET BY MOUTH AT ONSET OF MIGRAINE. MAY REPEAT IN 2 HOURS IF HEADACHE PERSISTS. MAX 3/DAY 09/30/16  Yes Rumball, Bryson Ha, DO  timolol (TIMOPTIC) 0.5 % ophthalmic solution Place 1 drop into both eyes 2 (two) times daily.   Yes [provider]  Travoprost, BAK Free, (TRAVATAN) 0.004 % SOLN ophthalmic solution Place 1 drop into both eyes at bedtime.   Yes [provider]  varenicline (CHANTIX) 1 MG tablet Take 1 tablet (1 mg total) by mouth 2 (two) times daily. 01/08/16  Yes Katheren Shams, DO  HYDROcodone-acetaminophen (NORCO/VICODIN) 5-325 MG tablet Take 1 tablet by mouth every 6 (six) hours as needed for severe pain. 01/05/17   Quintella Reichert, MD  omeprazole (PRILOSEC) 20 MG capsule Take 1 capsule (20 mg total) by mouth daily. 01/05/17   Quintella Reichert, MD  sucralfate (CARAFATE) 1 GM/10ML suspension Take 10 mLs (1 g total) by mouth 4 (four) times daily -  with meals and at bedtime. 01/05/17   Quintella Reichert, MD    Family History Family History  Problem Relation Age of Onset  . Brain cancer Maternal Aunt   . Depression Mother   . Heart  disease Mother   . Depression Son   . Diabetes Maternal Grandmother   . Stroke Maternal Grandmother   . Depression Maternal Grandmother   . Depression Daughter   . Malignant hyperthermia Son 16       tonsils drained    Social History Social History  Substance Use Topics  . Smoking status: Current Some Day Smoker    Packs/day: 0.18    Years: 36.00    Types: Cigarettes    Start date: 03/14/1978  . Smokeless tobacco: Never Used     Comment: starting chantix, hx 1 1/2 PPD(has decrease to 2 cigs per day)  . Alcohol use No     Allergies   Penicillins; Codeine; Morphine and related; and Tomato   Review of Systems Review of Systems  Gastrointestinal: Positive for abdominal pain.  All other systems reviewed and are negative.    Physical Exam Updated Vital Signs BP 116/77   Pulse 62   Temp 98.6 F (37 C) (Oral)   Resp 14   Ht 5\' 2"  (1.575 m)   Wt 95.3 kg (210 lb)   SpO2 100%   BMI 38.41 kg/m   Physical Exam  Constitutional: She is oriented to person, place, and time. She appears well-developed and well-nourished.  HENT:  Head: Normocephalic and atraumatic.  Cardiovascular: Normal rate and regular rhythm.   No murmur heard. Pulmonary/Chest: Effort normal and breath sounds normal. No respiratory distress.  Abdominal: Soft. There is no rebound and no guarding.  Mild diffuse abdominal tenderness with moderate left upper quadrant tenderness to palpation  Musculoskeletal: She exhibits no edema or tenderness.  Neurological: She is alert and oriented to person, place, and time.  Skin: Skin is warm and dry.  Psychiatric: She has a normal mood and affect. Her behavior is normal.  Nursing note and vitals reviewed.    ED Treatments / Results  Labs (all labs ordered are listed, but only abnormal results are displayed) Labs Reviewed  COMPREHENSIVE METABOLIC PANEL - Abnormal; Notable for the following:       Result Value   Potassium 3.4 (*)    Chloride 100 (*)     Glucose, Bld  164 (*)    Creatinine, Ser 1.37 (*)    GFR calc non Af Amer 43 (*)    GFR calc Af Amer 50 (*)    All other components within normal limits  LIPASE, BLOOD - Abnormal; Notable for the following:    Lipase 300 (*)    All other components within normal limits  CBC WITH DIFFERENTIAL/PLATELET - Abnormal; Notable for the following:    RBC 5.12 (*)    Hemoglobin 15.8 (*)    HCT 46.1 (*)    All other components within normal limits  I-STAT TROPONIN, ED    EKG  EKG Interpretation None       Radiology Ct Abdomen Pelvis W Contrast  Result Date: 01/05/2017 CLINICAL DATA:  Abdominal pain. EXAM: CT ABDOMEN AND PELVIS WITH CONTRAST TECHNIQUE: Multidetector CT imaging of the abdomen and pelvis was performed using the standard protocol following bolus administration of intravenous contrast. CONTRAST:  17mL ISOVUE-300 IOPAMIDOL (ISOVUE-300) INJECTION 61% COMPARISON:  November 08, 2013 FINDINGS: Lower chest: No acute abnormality. Hepatobiliary: No focal liver abnormality is seen. No gallstones, gallbladder wall thickening, or biliary dilatation. Pancreas: Unremarkable. No pancreatic ductal dilatation or surrounding inflammatory changes. Spleen: Normal in size without focal abnormality. Adrenals/Urinary Tract: The adrenal glands are normal. Multiple low-attenuation lesions in both kidneys are likely cysts but too small to characterize. The largest in the left kidney on series 3, image 25 a in series 6, image 88 measures up to 1.8 cm with an attenuation of 22 Hounsfield units. No other suspicious renal masses. The ureters are normal in appearance with no stones. The bladder is unremarkable. Stomach/Bowel: The stomach and small bowel are normal. No obstruction. The colon is normal in appearance. No evidence of colitis. The appendix is well seen with no appendicitis. Vascular/Lymphatic: No significant vascular findings are present. No enlarged abdominal or pelvic lymph nodes. Reproductive: The patient  is status post hysterectomy. The uterus is surgically absent. The left ovary is normal in appearance. The right ovary is unremarkable measuring 3.6 cm, similar since the May 16, 2012 ultrasound. Fluid along the medial aspect of the right ovary was thought to represent mild right hydrosalpinx at the time of the previous ultrasound. Other: No free air or free fluid. A fat containing umbilical hernia is noted. Musculoskeletal: Surgical changes are seen in the lower lumbar spine with pedicle screws and a disc spacer device at these levels. Hardware is intact. No other acute bony abnormalities are seen. IMPRESSION: 1. No acute abnormalities to explain the patient's pain is identified. 2. There is an 18 mm low-attenuation lesion in the upper left kidney. It is indeterminate on today's study but may represent a mildly complicated cyst. Recommend an ultrasound for better evaluation. 3. No other acute abnormalities. Electronically Signed   By: Dorise Bullion III M.D   On: 01/05/2017 10:02    Procedures Procedures (including critical care time)  Medications Ordered in ED Medications  sodium chloride 0.9 % bolus 500 mL (0 mLs Intravenous Stopped 01/05/17 1056)  ondansetron (ZOFRAN) injection 4 mg (4 mg Intravenous Given 01/05/17 0813)  fentaNYL (SUBLIMAZE) injection 50 mcg (50 mcg Intravenous Given 01/05/17 0813)  iopamidol (ISOVUE-300) 61 % injection (100 mLs  Contrast Given 01/05/17 0917)  gi cocktail (Maalox,Lidocaine,Donnatal) (30 mLs Oral Given 01/05/17 1046)  pantoprazole (PROTONIX) injection 40 mg (40 mg Intravenous Given 01/05/17 1047)  fentaNYL (SUBLIMAZE) injection 50 mcg (50 mcg Intravenous Given 01/05/17 1047)     Initial Impression / Assessment and Plan / ED Course  I have reviewed the triage vital signs and the nursing notes.  Pertinent labs & imaging results that were available during my care of the patient were reviewed by me and considered in my medical decision making (see chart for  details).    Patient here for evaluation of 4 days of left upper quadrant tenderness, worse with meals.  She does have some tenderness on examination.  Lipase is elevated at 300.  CT abdomen with no clear source of her symptoms, no evidence of pancreatitis on imaging.  She is hydrated on examination and tolerating oral fluids, improved on repeat assessment.  Discussed with patient incidental finding on CT scan of renal lesion.  Discussed with patient gastritis versus pancreatitis.  Discussed oral fluid hydration, symptom control, GI follow-up and return precautions.  Final Clinical Impressions(s) / ED Diagnoses   Final diagnoses:  Acute gastritis without hemorrhage, unspecified gastritis type    New Prescriptions Discharge Medication List as of 01/05/2017 11:39 AM    START taking these medications   Details  HYDROcodone-acetaminophen (NORCO/VICODIN) 5-325 MG tablet Take 1 tablet by mouth every 6 (six) hours as needed for severe pain., Starting Thu 01/05/2017, Print    omeprazole (PRILOSEC) 20 MG capsule Take 1 capsule (20 mg total) by mouth daily., Starting Thu 01/05/2017, Print         Quintella Reichert, MD 01/05/17 [phone]

## 2017-01-13 ENCOUNTER — Other Ambulatory Visit: Payer: Self-pay | Admitting: Family Medicine

## 2017-01-13 MED ORDER — GABAPENTIN 300 MG PO CAPS: 300.0000 mg | ORAL_CAPSULE | Freq: Three times a day (TID) | ORAL | 2 refills | Status: DC

## 2017-01-18 ENCOUNTER — Other Ambulatory Visit: Payer: Self-pay | Admitting: Family Medicine

## 2017-01-18 ENCOUNTER — Ambulatory Visit: Payer: Medicare Other | Admitting: Nurse Practitioner

## 2017-01-18 ENCOUNTER — Encounter: Payer: Self-pay | Admitting: Nurse Practitioner

## 2017-01-18 ENCOUNTER — Other Ambulatory Visit (INDEPENDENT_AMBULATORY_CARE_PROVIDER_SITE_OTHER): Payer: Medicare Other

## 2017-01-18 VITALS — BP 112/72 | HR 76 | Ht 62.0 in | Wt 213.1 lb

## 2017-01-18 DIAGNOSIS — R1010 Upper abdominal pain, unspecified: Secondary | ICD-10-CM | POA: Diagnosis not present

## 2017-01-18 DIAGNOSIS — R748 Abnormal levels of other serum enzymes: Secondary | ICD-10-CM

## 2017-01-18 LAB — COMPREHENSIVE METABOLIC PANEL
ALT: 20 U/L (ref 0–35)
AST: 14 U/L (ref 0–37)
Albumin: 4.3 g/dL (ref 3.5–5.2)
Alkaline Phosphatase: 66 U/L (ref 39–117)
BUN: 12 mg/dL (ref 6–23)
CO2: 29 mEq/L (ref 19–32)
Calcium: 9.5 mg/dL (ref 8.4–10.5)
Chloride: 100 mEq/L (ref 96–112)
Creatinine, Ser: 1.38 mg/dL — ABNORMAL HIGH (ref 0.40–1.20)
GFR: 51.31 mL/min — ABNORMAL LOW (ref >60.00)
Glucose, Bld: 376 mg/dL — ABNORMAL HIGH (ref 70–99)
Potassium: 3.7 mEq/L (ref 3.5–5.1)
Sodium: 137 mEq/L (ref 135–145)
Total Bilirubin: 0.8 mg/dL (ref 0.2–1.2)
Total Protein: 7.2 g/dL (ref 6.0–8.3)

## 2017-01-18 LAB — CBC
HCT: 49.2 % — ABNORMAL HIGH (ref 36.0–46.0)
Hemoglobin: 16.2 g/dL — ABNORMAL HIGH (ref 12.0–15.0)
MCHC: 32.9 g/dL (ref 30.0–36.0)
MCV: 92.4 fl (ref 78.0–100.0)
Platelets: 213 10*3/uL (ref 150.0–400.0)
RBC: 5.32 Mil/uL — ABNORMAL HIGH (ref 3.87–5.11)
RDW: 13.7 % (ref 11.5–15.5)
WBC: 7 10*3/uL (ref 4.0–10.5)

## 2017-01-18 LAB — LIPASE: Lipase: 180 U/L — ABNORMAL HIGH (ref 11.0–59.0)

## 2017-01-18 LAB — TRIGLYCERIDES: Triglycerides: 136 mg/dL (ref 0.0–149.0)

## 2017-01-18 MED ORDER — SUCRALFATE 1 GM/10ML PO SUSP
1.0000 g | Freq: Two times a day (BID) | ORAL | 0 refills | Status: DC
Start: 2017-01-18 — End: 2017-10-31

## 2017-01-18 MED ORDER — OMEPRAZOLE 20 MG PO CPDR: 20.0000 mg | DELAYED_RELEASE_CAPSULE | ORAL | 0 refills | Status: DC

## 2017-01-18 MED ORDER — TRAMADOL HCL 50 MG PO TABS: 50.0000 mg | ORAL_TABLET | Freq: Four times a day (QID) | ORAL | 0 refills | Status: DC | PRN

## 2017-01-18 NOTE — Patient Instructions (Signed)
If you are age 53 or older, your body mass index should be between 23-30. Your Body mass index is 38.98 kg/m. If this is out of the aforementioned range listed, please consider follow up with your Primary Care Provider.  If you are age 28 or younger, your body mass index should be between 19-25. Your Body mass index is 38.98 kg/m. If this is out of the aformentioned range listed, please consider follow up with your Primary Care Provider.   Your physician has requested that you go to the basement for lab work before leaving today.   We have sent the following medications to your pharmacy for you to pick up at your convenience: Ultram Carafate Omeprazole  You have been scheduled for an abdominal ultrasound at [facility] (1st floor of hospital) on 01/19/17 at 1130 am. Please arrive 15 minutes prior to your appointment for registration. Make certain not to have anything to eat or drink after midnight the day prior to your appointment. Should you need to reschedule your appointment, please contact radiology at [phone]. This test typically takes about 30 minutes to perform.   Thank you for choosing me and [facility].   Tye Savoy, NP

## 2017-01-18 NOTE — Progress Notes (Signed)
Initial assessment and plans noted 

## 2017-01-18 NOTE — Progress Notes (Signed)
HPI: Patient is a 51 yo female known to Dr. Henrene Pastor, last seen March 2016. She has chronic constipation, takes Linzess as needed. She was last seen in 2016.   Patient presents with ~ 2 week hx of early satiety and LUQ cramping radiating through to back. Pain is constant, eating exacerbates the pain. Given Carafate, prilosec and hydrocodone in ED and symptoms significantly improved within a week. Took the hydrocodone only if pain broke through carafate and prilosec. Ran out of all meds several days ago and pain has returned. Mild nausea. Trying to manage by eating bland foods.  Thinks weight is down a few pounds, by Epic she up 3 pounds from late Sept . No NSAID use. Went to ED on 10/25 for the pain.   ED workup: Lipase 300. CKD- creatinine stable at 1.37.  Potassium slightly low at 3.4.  Her white count was normal.  Hemoglobin normal at 15.8.  Urinalysis unremarkable except for presence of protein  CT scan of the abdomen and pelvis with IV contrast revealing.  Multiple renal lesions felt to be cysts but too small to characterize.     Past Medical History:  Diagnosis Date  . Anxiety   . Arthritis   . Blindness, legal    glaucoma and retinitis  pigmentosa  . Chronic eczema   . Chronic headaches 10-17-11   not migraines- just regular  . Constipation   . Depression   . Diabetes mellitus type II    diet   . Family history of anesthesia complication    1 son had malignant hyperthermia at 19yrs  tonsils rained  . Family history of malignant hyperthermia    1 son 16 years ago  . Fibromyalgia    denies, has not been diagnosited  . GERD (gastroesophageal reflux disease)   . Glaucoma   . History of chemotherapy 01/2011 to 03/2011   concurrent w/radiation therapy  . Hx of radiation therapy 01-17-11 to 03-18-11   lung  . Hyperlipidemia   . Hypertension   . Lung cancer ([place])    lung ca dx 2010  . Malignant hyperthermia    As of 12/30/15, no personal MH history, but reported her son has a  history of MH ~ 2000 (no confirmatory testing done)  . Onychomycosis   . Retinitis pigmentosa   . Shortness of breath    with exertion  . Sleep apnea    cpap 5 yrs     Past Surgical History:  Procedure Laterality Date  . ABDOMINAL HYSTERECTOMY  09/19/2001   hx. of fibroids. TAH. Ovaries remain.   [facility] Kitchen BACK SURGERY  06/2013  . CHEST TUBE INSERTION   04/01/08   L hydropneumothorax  . EYE SURGERY Bilateral    lens implant  . KNEE ARTHROSCOPY  10-17-11   bil. knee scope(left was torn ligament)  . lung surgery  03/24/08   L vats, L thoracotomy and LUL trisegmentectomy with node dissection  . PORTACATH PLACEMENT  02/11/2011   Procedure: INSERTION PORT-A-CATH;  Surgeon: Pierre Bali, MD;  Location: [facility];  Service: Thoracic;  Laterality: Right;  9.6Fr. Pre-attached Power Port in Right Internal Jugular  -right chest-remains inplace 10-17-11   Family History  Problem Relation Age of Onset  . Brain cancer Maternal Aunt   . Depression Mother   . Heart disease Mother   . Depression Son   . Diabetes Maternal Grandmother   . Stroke Maternal Grandmother   . Depression Maternal Grandmother   .  Depression Daughter   . Malignant hyperthermia Son 16       tonsils drained   Social History   Tobacco Use  . Smoking status: Current Some Day Smoker    Packs/day: 0.18    Years: 36.00    Pack years: 6.48    Types: Cigarettes    Start date: 03/14/1978  . Smokeless tobacco: Never Used  . Tobacco comment: starting chantix, hx 1 1/2 PPD(has decrease to 2 cigs per day)  Substance Use Topics  . Alcohol use: No    Alcohol/week: 0.0 oz  . Drug use: No   Current Outpatient Medications  Medication Sig Dispense Refill  . albuterol (PROVENTIL HFA;VENTOLIN HFA) 108 (90 Base) MCG/ACT inhaler Inhale 2 puffs into the lungs every 4 (four) hours as needed for wheezing or shortness of breath. 1 Inhaler 1  . amLODipine (NORVASC) 10 MG tablet TAKE 1 TABLET BY MOUTH EVERY DAY, FOLLOW UP WITH PCP FOR REFILLS 90  tablet 3  . aspirin EC 81 MG tablet Take 1 tablet (81 mg total) by mouth daily. 30 tablet 11  . atorvastatin (LIPITOR) 40 MG tablet Take 1 tablet (40 mg total) by mouth daily. 90 tablet 3  . carvedilol (COREG) 25 MG tablet TAKE 1 TABLET (25 MG TOTAL) BY MOUTH 2 (TWO) TIMES DAILY WITH A MEAL. 180 tablet 1  . clobetasol cream (TEMOVATE) 9.38 % Apply 1 application topically 2 (two) times daily. 60 g 1  . cyclobenzaprine (FLEXERIL) 10 MG tablet TAKE 1 TABLET BY MOUTH THREE TIMES A DAY AS NEEDED FOR MUSCLE SPASMS 30 tablet 0  . FLUoxetine (PROZAC) 20 MG capsule Take 1 capsule (20 mg total) by mouth daily. 90 capsule 1  . gabapentin (NEURONTIN) 300 MG capsule Take 1 capsule (300 mg total) by mouth 3 (three) times daily. 90 capsule 2  . HYDROcodone-acetaminophen (NORCO/VICODIN) 5-325 MG tablet Take 1 tablet by mouth every 6 (six) hours as needed for severe pain. 5 tablet 0  . Insulin Glargine (LANTUS SOLOSTAR) 100 UNIT/ML Solostar Pen INJECT 20 UNITS EVERY MORNING 15 pen 2  . Insulin Pen Needle (PEN NEEDLES) 32G X 4 MM MISC 1 Stick by Does not apply route daily. 100 each 1  . linaclotide (LINZESS) 290 MCG CAPS capsule Take 1 capsule (290 mcg total) by mouth daily. Follow-up with PCP for further refills 30 capsule 1  . lisinopril (PRINIVIL,ZESTRIL) 5 MG tablet Take 0.5 tablets (2.5 mg total) by mouth daily. 90 tablet 0  . metFORMIN (GLUCOPHAGE-XR) 500 MG 24 hr tablet TAKE 2 TABLETS (1,000 MG TOTAL) BY MOUTH 2 (TWO) TIMES DAILY. 360 tablet 0  . omeprazole (PRILOSEC) 20 MG capsule Take 1 capsule (20 mg total) by mouth daily. 14 capsule 0  . ranitidine (ZANTAC) 150 MG tablet Take 1 tablet (150 mg total) by mouth 2 (two) times daily. 60 tablet 3  . sucralfate (CARAFATE) 1 GM/10ML suspension Take 10 mLs (1 g total) by mouth 4 (four) times daily -  with meals and at bedtime. 420 mL 0  . SUMAtriptan (IMITREX) 50 MG tablet TAKE 1 TABLET BY MOUTH AT ONSET OF MIGRAINE. MAY REPEAT IN 2 HOURS IF HEADACHE PERSISTS. MAX  3/DAY 15 tablet 0  . timolol (TIMOPTIC) 0.5 % ophthalmic solution Place 1 drop into both eyes 2 (two) times daily.    . Travoprost, BAK Free, (TRAVATAN) 0.004 % SOLN ophthalmic solution Place 1 drop into both eyes at bedtime.    . varenicline (CHANTIX) 1 MG tablet Take 1 tablet (1  mg total) by mouth 2 (two) times daily. 60 tablet 1   No current facility-administered medications for this visit.    Allergies  Allergen Reactions  . Penicillins Hives and Rash    Has patient had a PCN reaction causing immediate rash, facial/tongue/throat swelling, SOB or lightheadedness with hypotension:   # # YES # #   Has patient had a PCN reaction causing severe rash involving mucus membranes or skin necrosis:  # # NO # #  Has patient had a PCN reaction that required hospitalization  # # NO # #  Has patient had a PCN reaction occurring within the last 10 years:  # # NO # #  If all of the above answers are "NO", then may proceed with Cephalosporin use.   . Codeine Nausea And Vomiting  . Morphine And Related Nausea Only  . Tomato Hives and Itching     Review of Systems: All systems reviewed and negative except where noted in HPI.    Physical Exam: BP 112/72 (BP Location: Left Arm, Patient Position: Sitting, Cuff Size: Large)   Pulse 76   Ht 5\' 2"  (1.575 m)   Wt 213 lb 2 oz (96.7 kg)   BMI 38.98 kg/m  Constitutional:  Well-developed, black female in no acute distress. Psychiatric: Normal mood and affect. Behavior is normal. EENT: Pupils normal.  Conjunctivae are normal. No scleral icterus. Neck supple.  Cardiovascular: Normal rate, regular rhythm. No edema Pulmonary/chest: Effort normal and breath sounds normal. No wheezing, rales or rhonchi. Abdominal: Soft, nondistended. Mild epigastric / LUQ tenderness. Bowel sounds active throughout. There are no masses palpable. No hepatomegaly. Neurological: Alert and oriented to person place and time. Skin: Skin is warm and dry. No rashes  noted.   ASSESSMENT AND PLAN:  51 yo female with two week hx of constant LUQ pain, worse with meals. ED evaluation on 10/25 remarkable only for lipase of 300. Nondrinker. No hx of pancreatitis. No radiographic findings of pancreatitis on CT scan. No pancreatic lesions. No gallstones / biliary duct dilation. She could have mild pancreatitis but then not sure why pain improved significantly with carafate and prilosec.  -repeat lipase today -Repeat CMET , CBC -triglyceride level -ultrasound to look for cholelithiasis since gallstones can be missed on CTscan -clear liquids for now -Continue  carafate and prilosec until pain can be sorted out,  especially since it helps her pains. She has a hx of constipation so will try and avoid narcotics. Trial of ultram.  -I will call her with results and to get a condition update in a day or two -if proves to be pancreatitis and u/s negative then will need to look at meds for potential causes    Tye Savoy, NP  01/18/2017, 9:06 AM

## 2017-01-19 ENCOUNTER — Ambulatory Visit (HOSPITAL_COMMUNITY)
Admission: RE | Admit: 2017-01-19 | Discharge: 2017-01-19 | Disposition: A | Discharge status: Home / Self Care | Payer: Medicare Other | Source: Ambulatory Visit | Attending: Nurse Practitioner | Admitting: Nurse Practitioner

## 2017-01-19 DIAGNOSIS — R1010 Upper abdominal pain, unspecified: Secondary | ICD-10-CM

## 2017-01-19 DIAGNOSIS — R748 Abnormal levels of other serum enzymes: Secondary | ICD-10-CM | POA: Diagnosis present

## 2017-01-19 DIAGNOSIS — N281 Cyst of kidney, acquired: Secondary | ICD-10-CM | POA: Insufficient documentation

## 2017-01-20 ENCOUNTER — Telehealth: Payer: Self-pay

## 2017-01-20 NOTE — Telephone Encounter (Signed)
-----   Message from Willia Craze, NP sent at 01/19/2017  2:58 PM EST ----- Eustaquio Maize, please see how she feels. I hope ultram helping. Would stay on clear and advance to low fat when tolerated. She may be dehydrated, need to push fluids. I can't tolerate PO or pain worsening then to ED. U/S was negative. Thanks

## 2017-01-20 NOTE — Telephone Encounter (Signed)
The patient has advanced her diet. She is advised of the improved lipase and the extremely high glucose from recent labs. She is advised of the u/s of her abdomen that was without acute findings. Her nausea has persisted. The patient would like medication for her nausea. She is maintaining hydration. Please advise.

## 2017-02-07 NOTE — Telephone Encounter (Signed)
Theresa [name], I left message on patient's voice mail asking her to call with an update. If still nauseated she can have some Zofran 4mg  /q6 hour # 20 but will also need to be seen in follow up because nausea from pancreatitis should not be ongoing. Thanks

## 2017-02-12 NOTE — Progress Notes (Signed)
Subjective:   Patient ID: Theresa [name]    DOB: 11-30-1965, 51 y.o. female   MRN: [id number]  Theresa [name] is a 51 y.o. female with a history of HTN, DM2, obesity, CKD3 here for   Hypertension: - Medications: Norvasc 10mg , Coreg 25mg , lisinopril 5mg  - Compliance: no issues - Checking BP at home: no.  - Denies any CP, vision changes, LE edema, medication SEs, or symptoms of hypotension - Diet and Exercise: see below  Diabetes, Type 2 - Last A1c 7.3 11/2016, 9.1 today - Medications: Lantus 20u, metformin 1000mg  BID. Lipitor 40mg . - Compliance: takes metformin every day, depending on what sugars are running in the afternoon, sometimes won't take lantus. - Checking BG at home: yes, running 200s - Diet: egg and toast for breakfast around 0800, skips lunch, eats dinner at 8pm. Baked chicken, potatoes, salad. Sometimes her "stomach will get messed up" with larger portions and tends to eat small portions. - Exercise: walks  - Eye exam: has appointment in January. - Foot exam: last 04/2016 - Denies symptoms of hypoglycemia, foot ulcers/trauma - Endorses polyuria, polydipsia, tingling in hands and feet  Healthcare Maintenance - Vaccines: pneumococcal, flu (previously declined) - Colonoscopy: due since 2015 - Mammogram: due, ordered this visit - Eye exam: due, has appointment in January - Pap Smear: hysterectomy - HIV screening: due  Review of Systems:  Per HPI.   [place]: reviewed. Smoking status reviewed. Medications reviewed.  Objective:   BP 124/78   Pulse 87   Temp 98.7 F (37.1 C) (Oral)   Ht 5\' 2"  (1.575 m)   Wt 210 lb 12.8 oz (95.6 kg)   SpO2 99%   BMI 38.56 kg/m  Vitals and nursing note reviewed.  General: obese female, in no acute distress with non-toxic appearance HEENT: normocephalic, atraumatic, moist mucous membranes Neck: supple, non-tender without lymphadenopathy CV: regular rate and rhythm without murmurs, rubs, or gallops Lungs: clear to auscultation  bilaterally with normal work of breathing Skin: warm, dry, no rashes or lesions Extremities: warm and well perfused, normal tone MSK: ROM grossly intact, strength intact, gait normal Neuro: Alert and oriented, speech normal  Assessment & Plan:   Diabetes mellitus type 2, uncontrolled A1c 9.1 today. Elevated blood sugars likely multifactorial in etiology: diet, limited compliance with medication. Instructed to continue blood glucose monitoring and to take Lantus and Metformin every day. Encouraged to keep food diary and to make appointment with Dr. Jenne [name]. Information given regarding diabetic diet.  Essential hypertension 124/78 today. Controlled on current regimen of Lisinopril, amlodipine, coreg. Labs drawn after last dose change with stable Cr, K. - No changes made, refills given as needed.  Orders Placed This Encounter  Procedures  . MM DIGITAL SCREENING BILATERAL    Standing Status:   Future    Standing Expiration Date:   02/13/2018    Order Specific Question:   Reason for Exam (SYMPTOM  OR DIAGNOSIS REQUIRED)    Answer:   screening for breast cancer    Order Specific Question:   Is the patient pregnant?    Answer:   No    Order Specific Question:   Preferred imaging location?    Answer:   [facility]  . Flu Vaccine QUAD 36+ mos IM  . Pneumococcal polysaccharide vaccine 23-valent greater than or equal to 2yo subcutaneous/IM  . Amb ref to Medical Nutrition Therapy-MNT    Referral Priority:   Routine    Referral Type:   Consultation    Referral Reason:  Specialty Services Required    Requested Specialty:   Nutrition    Number of Visits Requested:   1  . HgB A1c   Meds ordered this encounter  Medications  . Insulin Glargine (LANTUS SOLOSTAR) 100 UNIT/ML Solostar Pen    Sig: INJECT 20 UNITS EVERY MORNING    Dispense:  15 pen    Refill:  2  . amLODipine (NORVASC) 10 MG tablet    Sig: TAKE 1 TABLET BY MOUTH EVERY DAY, FOLLOW UP WITH PCP FOR REFILLS    Dispense:  90  tablet    Refill:  3    **Patient requests 90 days supply**  . atorvastatin (LIPITOR) 40 MG tablet    Sig: Take 1 tablet (40 mg total) by mouth daily.    Dispense:  90 tablet    Refill:  [place], DO PGY-1, [facility] 02/13/2017 6:23 PM

## 2017-02-13 ENCOUNTER — Ambulatory Visit: Payer: Medicare Other | Admitting: Family Medicine

## 2017-02-13 ENCOUNTER — Encounter: Payer: Self-pay | Admitting: Family Medicine

## 2017-02-13 ENCOUNTER — Other Ambulatory Visit: Payer: Self-pay

## 2017-02-13 VITALS — BP 124/78 | HR 87 | Temp 98.7°F | Ht 62.0 in | Wt 210.8 lb

## 2017-02-13 DIAGNOSIS — E1165 Type 2 diabetes mellitus with hyperglycemia: Secondary | ICD-10-CM | POA: Diagnosis not present

## 2017-02-13 DIAGNOSIS — Z1231 Encounter for screening mammogram for malignant neoplasm of breast: Secondary | ICD-10-CM

## 2017-02-13 DIAGNOSIS — I10 Essential (primary) hypertension: Secondary | ICD-10-CM

## 2017-02-13 DIAGNOSIS — Z23 Encounter for immunization: Secondary | ICD-10-CM

## 2017-02-13 DIAGNOSIS — Z1239 Encounter for other screening for malignant neoplasm of breast: Secondary | ICD-10-CM

## 2017-02-13 LAB — POCT GLYCOSYLATED HEMOGLOBIN (HGB A1C): Hemoglobin A1C: 9.1

## 2017-02-13 MED ORDER — INSULIN GLARGINE 100 UNIT/ML SOLOSTAR PEN: PEN_INJECTOR | SUBCUTANEOUS | 2 refills | Status: DC

## 2017-02-13 MED ORDER — ATORVASTATIN CALCIUM 40 MG PO TABS: 40.0000 mg | ORAL_TABLET | Freq: Every day | ORAL | 3 refills | Status: DC

## 2017-02-13 MED ORDER — AMLODIPINE BESYLATE 10 MG PO TABS
ORAL_TABLET | ORAL | 3 refills | Status: DC
Start: 2017-02-13 — End: 2018-04-09

## 2017-02-13 NOTE — Assessment & Plan Note (Addendum)
124/78 today. Controlled on current regimen of Lisinopril, amlodipine, coreg. Labs drawn after last dose change with stable Cr, K. - No changes made, refills given as needed.

## 2017-02-13 NOTE — Assessment & Plan Note (Addendum)
A1c 9.1 today. Elevated blood sugars likely multifactorial in etiology: diet, limited compliance with medication. Instructed to continue blood glucose monitoring and to take Lantus and Metformin every day. Encouraged to keep food diary and to make appointment with Dr. Jenne Campus. Information given regarding diabetic diet.

## 2017-02-13 NOTE — Patient Instructions (Addendum)
It was great to see you!  For your diabetes,  - Make to to take your Metformin and Lantus every day. - continue to check your blood sugars - I am recommending you make an appointment with Dr. Jenne Campus, our nutritionist at [phone] to help with a diabetes diet. I also included some information below.  For your blood pressure, - It is well controlled on your current regimen, no changes today.  Please make an appointment to get your screening mammogram.  Take care and seek immediate care sooner if you develop any concerns.   Rory Percy, DO [facility]   Diet Recommendations for Diabetes   1. Eat at least 3 meals and 1-2 snacks per day. Never go more than 4-5 hours while awake without eating. Eat breakfast within the first hour of getting up.   2. Limit starchy foods to TWO per meal and ONE per snack. ONE portion of a starchy  food is equal to the following:   - ONE slice of bread (or its equivalent, such as half of a hamburger bun).   - 1/2 cup of a "scoopable" starchy food such as potatoes or rice.   - 15 grams of Total Carbohydrate as shown on food label.  3. Include at every meal: a protein food, a carb food, and vegetables and/or fruit.   - Obtain twice the volume of vegetables as protein or carbohydrate foods for both lunch and dinner.   - Fresh or frozen vegetables are best.   - Keep frozen vegetables on hand for a quick vegetable serving.       Starchy (carb) foods: Bread, rice, pasta, potatoes, corn, cereal, grits, crackers, bagels, muffins, all baked goods.  (Fruits, milk, and yogurt also have carbohydrate, but most of these foods will not spike your blood sugar as most starchy foods will.)  A few fruits do cause high blood sugars; use small portions of bananas (limit to 1/2 at a time), grapes, watermelon, oranges, and most tropical fruits.    Protein foods: Meat, fish, poultry, eggs, dairy foods, and beans such as pinto and kidney beans (beans also provide  carbohydrate).     Here is an example of what a healthy plate looks like:    ? Make half your plate fruits and vegetables.     ? Focus on whole fruits.     ? Vary your veggies.  ? Make half your grains whole grains. -     ? Look for the word "whole" at the beginning of the ingredients list    ? Some whole-grain ingredients include whole oats, whole-wheat flour,        whole-grain corn, whole-grain brown rice, and whole rye.  ? Move to low-fat and fat-free milk or yogurt.  ? Vary your protein routine. - Meat, fish, poultry (chicken, [place]), eggs, beans (kidney, pinto), dairy.  ? Drink and eat less sodium, saturated fat, and added sugars.

## 2017-02-14 ENCOUNTER — Other Ambulatory Visit: Payer: Self-pay | Admitting: Nurse Practitioner

## 2017-02-24 ENCOUNTER — Other Ambulatory Visit: Payer: Self-pay | Admitting: Family Medicine

## 2017-02-24 DIAGNOSIS — I10 Essential (primary) hypertension: Secondary | ICD-10-CM

## 2017-02-24 DIAGNOSIS — Z794 Long term (current) use of insulin: Secondary | ICD-10-CM

## 2017-02-24 DIAGNOSIS — E1165 Type 2 diabetes mellitus with hyperglycemia: Secondary | ICD-10-CM

## 2017-02-24 MED ORDER — LISINOPRIL 5 MG PO TABS: 2.5000 mg | ORAL_TABLET | Freq: Every day | ORAL | 2 refills | Status: DC

## 2017-03-20 ENCOUNTER — Telehealth: Payer: Self-pay | Admitting: *Deleted

## 2017-03-20 NOTE — Telephone Encounter (Signed)
Pt lm on nurse line.  States that her cholesterol medication breaks her out.  Wants to know if it can be changed. Fle[place], Salome Spotted, CMA

## 2017-03-22 ENCOUNTER — Other Ambulatory Visit: Payer: Self-pay

## 2017-03-22 ENCOUNTER — Encounter: Payer: Self-pay | Admitting: Family Medicine

## 2017-03-22 ENCOUNTER — Ambulatory Visit: Payer: Medicare Other | Admitting: Family Medicine

## 2017-03-22 ENCOUNTER — Other Ambulatory Visit: Payer: Self-pay | Admitting: Nurse Practitioner

## 2017-03-22 VITALS — BP 130/80 | HR 66 | Temp 98.6°F | Wt 210.0 lb

## 2017-03-22 DIAGNOSIS — E785 Hyperlipidemia, unspecified: Secondary | ICD-10-CM | POA: Diagnosis not present

## 2017-03-22 DIAGNOSIS — E1165 Type 2 diabetes mellitus with hyperglycemia: Secondary | ICD-10-CM

## 2017-03-22 DIAGNOSIS — E7800 Pure hypercholesterolemia, unspecified: Secondary | ICD-10-CM

## 2017-03-22 DIAGNOSIS — Z794 Long term (current) use of insulin: Secondary | ICD-10-CM

## 2017-03-22 DIAGNOSIS — I10 Essential (primary) hypertension: Secondary | ICD-10-CM

## 2017-03-22 MED ORDER — LISINOPRIL 5 MG PO TABS: 2.5000 mg | ORAL_TABLET | Freq: Every day | ORAL | 2 refills | Status: DC

## 2017-03-22 MED ORDER — OMEPRAZOLE 20 MG PO CPDR: 20.0000 mg | DELAYED_RELEASE_CAPSULE | ORAL | 0 refills | Status: DC

## 2017-03-22 NOTE — Patient Instructions (Addendum)
We will check lipids today. Half your cholesterol medication for now.   Try spoonful of honey and warm liquids for the throat irritation.    Sign up for My Chart to have easy access to your labs results, and communication with your primary care physician.  Feel free to call with any questions or concerns at any time, at [phone].   Take care,  Dr. Bufford Lope, [place]

## 2017-03-22 NOTE — Assessment & Plan Note (Signed)
History is most consistent with a statin induced myalgia.  Likely patient's headaches and hair loss and acne or reaction to her significant stress in dealing with her muscle aches as physical exam is benign today. Patient's last lipid panel was in 2016 so we will recheck today with direct LDL to better ascertain her ASCVD risk score.  In the meantime advised decreasing to atorvastatin 20 mg daily to see if that is better tolerated.  Follow-up in 4 weeks with PCP.

## 2017-03-22 NOTE — Progress Notes (Signed)
    Subjective:  Theresa [name] is a 52 y.o. female who presents to the [facility] today with a chief complaint of medication reaction.   HPI:  Patient states that she has been on atorvastatin for a long time however developed muscle aches over the past several years.  She states that these muscle aches have been slowly worsening chronically but was at a tolerable level but worsened acutely over the past several days to week. Lately has aches in her neck and arms and legs that are significant enough to impede her ADLs, give her headaches, and overall cause a tremendous amount of stress in her life which she thinks may be leading to hair loss and some acne breakouts on her face. No abdominal pain or RUQ pain. Is willing to stay on statin but is wondering if any options for decreasing muscle aches. No falls or easy bruising or rashes.   ROS: Per HPI  Objective:  Physical Exam: BP 130/80   Pulse 66   Temp 98.6 F (37 C) (Oral)   Wt 210 lb (95.3 kg)   SpO2 98%   BMI 38.41 kg/m   Gen: NAD, resting comfortably CV: RRR with no murmurs appreciated Pulm: NWOB, CTAB with no crackles, wheezes, or rhonchi GI: Normal bowel sounds present. Soft, Nontender, Nondistended. MSK: no edema, cyanosis, or clubbing noted. TTP over L cervical paraspinal muscles. Normal strength and ROM. Skin: warm, dry. No rashes Neuro: grossly normal, moves all extremities Psych: Normal affect and thought content    Assessment/Plan:  Hypercholesterolemia History is most consistent with a statin induced myalgia.  Likely patient's headaches and hair loss and acne or reaction to her significant stress in dealing with her muscle aches as physical exam is benign today. Patient's last lipid panel was in 2016 so we will recheck today with direct LDL to better ascertain her ASCVD risk score.  In the meantime advised decreasing to atorvastatin 20 mg daily to see if that is better tolerated.  Follow-up in 4 weeks with PCP.   Bufford Lope, DO PGY-2, [facility] 03/22/2017 2:25 PM

## 2017-03-23 LAB — LIPID PANEL
Chol/HDL Ratio: 6.8 ratio — ABNORMAL HIGH (ref 0.0–4.4)
Cholesterol, Total: 250 mg/dL — ABNORMAL HIGH (ref 100–199)
HDL: 37 mg/dL — ABNORMAL LOW (ref >39)
LDL Calculated: 185 mg/dL — ABNORMAL HIGH (ref 0–99)
Triglycerides: 141 mg/dL (ref 0–149)
VLDL Cholesterol Cal: 28 mg/dL (ref 5–40)

## 2017-03-23 LAB — LDL CHOLESTEROL, DIRECT: LDL Direct: 196 mg/dL — ABNORMAL HIGH (ref 0–99)

## 2017-03-23 NOTE — Telephone Encounter (Signed)
Patient recently seen yesterday with subsequent decrease in statin and lipid panel drawn. Will follow.   Rory Percy, DO PGY-1, [facility] 03/23/2017 11:41 AM

## 2017-03-24 ENCOUNTER — Other Ambulatory Visit: Payer: Self-pay

## 2017-03-24 MED ORDER — OMEPRAZOLE 20 MG PO CPDR: 20.0000 mg | DELAYED_RELEASE_CAPSULE | ORAL | 2 refills | Status: DC

## 2017-03-27 ENCOUNTER — Ambulatory Visit: Payer: Medicare Other | Admitting: Family Medicine

## 2017-04-12 ENCOUNTER — Other Ambulatory Visit: Payer: Self-pay

## 2017-04-12 MED ORDER — OMEPRAZOLE 20 MG PO CPDR: 20.0000 mg | DELAYED_RELEASE_CAPSULE | ORAL | 2 refills | Status: DC

## 2017-05-15 NOTE — Progress Notes (Signed)
Subjective:   Patient ID: Theresa [name]    DOB: 1965/10/20, 52 y.o. female   MRN: [id number]  Theresa [name] is a 52 y.o. female with a history of HTN, DM2 here for   R Breast pain - evaluated in 11/2016, states feels the same since then, tenderness off and on. Ordered diagnostic mammogram at that time, has not gotten. - denies discharge, skin changes.  Myalgias - seen 1/9 for myalgias thought to be statin-induced. Lipid panel drawn and atorvastatin reduced to 20mg . ASCVD risk 20.7%.  - States has been taking full 40mg  pill but breaks apart to take 20mg  in the am and pm. Has helped.   Diabetes, Type 2  - Last A1c 9.1 02/2017, 7.9 today - Medications: Lantus 20u, metformin 1000mg  BID, Lipitor 20mg  (at last visit wasn't taking Lantus regularly). Taking metformin once daily. States has been on glipizide with the last time she took it was a few months ago in November/December. Can't recall who has been refilling. Per chart review d/ced in 2017. - Compliance: good - Checking BG at home: yes (120-130s) - Diet: was supposed to make appointment with Dr. Jenne Campus. Couldn't get set up because it is hard to walk.  - Eye exam: March 19 - Denies symptoms of hypoglycemia, foot ulcers/trauma - endorses numbness/tingling in hands and feet, blurry vision, polydipsia, polyuria.   Review of Systems:  Per HPI.   [place]: reviewed. Smoking status reviewed. Medications reviewed.  Objective:   BP 110/70   Pulse 70   Temp 98 F (36.7 C) (Oral)   Wt 214 lb (97.1 kg)   SpO2 95%   BMI 39.14 kg/m  Vitals and nursing note reviewed.  General: obese, in no acute distress with non-toxic appearance HEENT: normocephalic, atraumatic, moist mucous membranes CV: regular rate, no lower extremity edema Lungs: normal work of breathing Breast: no overt masses or evidence of infection. No dimpling or skin changes. Tenderness diffusely with light palpation of R breast. Skin: warm, dry, no rashes or  lesions Extremities: warm and well perfused, normal tone MSK: ROM grossly intact, strength intact, gait normal Neuro: Alert and oriented, speech normal  Lipid Panel     Component Value Date/Time   CHOL 250 (H) 03/22/2017 1450   TRIG 141 03/22/2017 1450   HDL 37 (L) 03/22/2017 1450   CHOLHDL 6.8 (H) 03/22/2017 1450   CHOLHDL 5.9 07/31/2014 1004   VLDL 37 07/31/2014 1004   LDLCALC 185 (H) 03/22/2017 1450   LDLDIRECT 196 (H) 03/22/2017 1450   LDLDIRECT 157 (H) 07/29/2011 1021    Assessment & Plan:   Diabetes mellitus type 2, uncontrolled A1c improved today, not yet at goal. Patient not taking metformin as prescribed, will refill 1000mg  tabs to better increase adherence. Instructed to take CBGs daily and increase Lantus by 1u for every CBG >150. Can consider adding GLP-1 agonist at next visit if A1c still uncontrolled. Encouraged patient to make appointment with nutritionist, Dr. Jenne Campus.  Breast pain, right Persistent pain off/on. Last evaluated in 11/2016 and ordered diagnostic mammogram at that time which she hasn't gotten. Breast exam wnl. Encouraged patient to make appointment for mammogram.  Hypercholesterolemia ASCVD risk 20%. Patient taking 40mg  split BID with some relief. Recommended continuing given she is receiving full high-intensity dose. If her myalgias worsen or are persistent at next visit, can consider switching statins.  Orders Placed This Encounter  Procedures  . POCT glycosylated hemoglobin (Hb A1C)   Meds ordered this encounter  Medications  . metFORMIN (  GLUCOPHAGE) 1000 MG tablet    Sig: Take 1 tablet (1,000 mg total) by mouth 2 (two) times daily with a meal.    Dispense:  180 tablet    Refill:  3  . Insulin Glargine (LANTUS SOLOSTAR) 100 UNIT/ML Solostar Pen    Sig: INJECT 20 UNITS EVERY MORNING    Dispense:  15 pen    Refill:  2  . Insulin Pen Needle (PEN NEEDLES) 32G X 4 MM MISC    Sig: 1 Stick by Does not apply route daily.    Dispense:  100 each     Refill:  1  . gabapentin (NEURONTIN) 300 MG capsule    Sig: Take 1 capsule (300 mg total) by mouth 3 (three) times daily.    Dispense:  90 capsule    Refill:  2   Rory Percy, DO PGY-1, [facility] 05/16/2017 9:38 AM

## 2017-05-16 ENCOUNTER — Ambulatory Visit: Payer: Medicare Other | Admitting: Family Medicine

## 2017-05-16 ENCOUNTER — Other Ambulatory Visit: Payer: Self-pay

## 2017-05-16 ENCOUNTER — Encounter: Payer: Self-pay | Admitting: Family Medicine

## 2017-05-16 VITALS — BP 110/70 | HR 70 | Temp 98.0°F | Wt 214.0 lb

## 2017-05-16 DIAGNOSIS — E118 Type 2 diabetes mellitus with unspecified complications: Secondary | ICD-10-CM

## 2017-05-16 DIAGNOSIS — Z794 Long term (current) use of insulin: Secondary | ICD-10-CM

## 2017-05-16 DIAGNOSIS — E1165 Type 2 diabetes mellitus with hyperglycemia: Secondary | ICD-10-CM | POA: Diagnosis not present

## 2017-05-16 DIAGNOSIS — N644 Mastodynia: Secondary | ICD-10-CM

## 2017-05-16 DIAGNOSIS — E7800 Pure hypercholesterolemia, unspecified: Secondary | ICD-10-CM | POA: Diagnosis not present

## 2017-05-16 LAB — POCT GLYCOSYLATED HEMOGLOBIN (HGB A1C): Hemoglobin A1C: 7.9

## 2017-05-16 MED ORDER — GABAPENTIN 300 MG PO CAPS: 300.0000 mg | ORAL_CAPSULE | Freq: Three times a day (TID) | ORAL | 2 refills | Status: DC

## 2017-05-16 MED ORDER — INSULIN GLARGINE 100 UNIT/ML SOLOSTAR PEN: PEN_INJECTOR | SUBCUTANEOUS | 2 refills | Status: DC

## 2017-05-16 MED ORDER — METFORMIN HCL 1000 MG PO TABS: 1000.0000 mg | ORAL_TABLET | Freq: Two times a day (BID) | ORAL | 3 refills | Status: DC

## 2017-05-16 MED ORDER — PEN NEEDLES 32G X 4 MM MISC: 1.0000 | Freq: Every day | 1 refills | Status: DC

## 2017-05-16 NOTE — Patient Instructions (Signed)
It was great to see you!  For your diabetes,  - Your A1c is 7.9 today which is improved but still not at goal. - Increase your metformin to twice daily, I called in refills for you. - Check your blood sugar every day. For every day your blood sugars are above 150, increase your Lantus by 1u. - Please call our nutritionist, Dr. Jenne Campus, to set up an appointment. [phone]. - Please return in 3 months for follow up.  For your cholesterol, - Continue to take Lipitor 40mg , you can continue to break the pill in half if that helps with your muscle pain.  For your breast pain, - There aren't any signs of infection or overt mass on exam today, but you will need to get your mammogram. I have provided the number to call.  Take care and seek immediate care sooner if you develop any concerns.   Rory Percy, DO [facility]   Diet Recommendations for Diabetes   1. Eat at least 3 meals and 1-2 snacks per day. Never go more than 4-5 hours while awake without eating. Eat breakfast within the first hour of getting up.   2. Limit starchy foods to TWO per meal and ONE per snack. ONE portion of a starchy  food is equal to the following:   - ONE slice of bread (or its equivalent, such as half of a hamburger bun).   - 1/2 cup of a "scoopable" starchy food such as potatoes or rice.   - 15 grams of Total Carbohydrate as shown on food label.  3. Include at every meal: a protein food, a carb food, and vegetables and/or fruit.   - Obtain twice the volume of vegetables as protein or carbohydrate foods for both lunch and dinner.   - Fresh or frozen vegetables are best.   - Keep frozen vegetables on hand for a quick vegetable serving.      Starchy (carb) foods: Bread, rice, pasta, potatoes, corn, cereal, grits, crackers, bagels, muffins, all baked goods.  (Fruits, milk, and yogurt also have carbohydrate, but most of these foods will not spike your blood sugar as most starchy foods will.)  A few  fruits do cause high blood sugars; use small portions of bananas (limit to 1/2 at a time), grapes, watermelon, oranges, and most tropical fruits.    Protein foods: Meat, fish, poultry, eggs, dairy foods, and beans such as pinto and kidney beans (beans also provide carbohydrate).    Here is an example of what a healthy plate looks like:    ? Make half your plate fruits and vegetables.     ? Focus on whole fruits.     ? Vary your veggies.  ? Make half your grains whole grains. -     ? Look for the word "whole" at the beginning of the ingredients list    ? Some whole-grain ingredients include whole oats, whole-wheat flour,        whole-grain corn, whole-grain brown rice, and whole rye.  ? Move to low-fat and fat-free milk or yogurt.  ? Vary your protein routine. - Meat, fish, poultry (chicken, [place]), eggs, beans (kidney, pinto), dairy.  ? Drink and eat less sodium, saturated fat, and added sugars.

## 2017-05-16 NOTE — Assessment & Plan Note (Signed)
Persistent pain off/on. Last evaluated in 11/2016 and ordered diagnostic mammogram at that time which she hasn't gotten. Breast exam wnl. Encouraged patient to make appointment for mammogram.

## 2017-05-16 NOTE — Assessment & Plan Note (Signed)
A1c improved today, not yet at goal. Patient not taking metformin as prescribed, will refill 1000mg  tabs to better increase adherence. Instructed to take CBGs daily and increase Lantus by 1u for every CBG >150. Can consider adding GLP-1 agonist at next visit if A1c still uncontrolled. Encouraged patient to make appointment with nutritionist, Dr. Jenne Campus.

## 2017-05-16 NOTE — Assessment & Plan Note (Signed)
ASCVD risk 20%. Patient taking 40mg  split BID with some relief. Recommended continuing given she is receiving full high-intensity dose. If her myalgias worsen or are persistent at next visit, can consider switching statins.

## 2017-05-30 LAB — HM DIABETES EYE EXAM

## 2017-06-05 ENCOUNTER — Ambulatory Visit: Payer: Medicare Other | Admitting: Family Medicine

## 2017-06-05 VITALS — BP 140/84 | HR 78 | Temp 98.9°F | Ht 62.0 in | Wt 216.0 lb

## 2017-06-05 DIAGNOSIS — J441 Chronic obstructive pulmonary disease with (acute) exacerbation: Secondary | ICD-10-CM

## 2017-06-05 DIAGNOSIS — J449 Chronic obstructive pulmonary disease, unspecified: Secondary | ICD-10-CM | POA: Insufficient documentation

## 2017-06-05 MED ORDER — TIOTROPIUM BROMIDE MONOHYDRATE 18 MCG IN CAPS: 18.0000 ug | ORAL_CAPSULE | Freq: Two times a day (BID) | RESPIRATORY_TRACT | 12 refills | Status: DC

## 2017-06-05 MED ORDER — DOXYCYCLINE HYCLATE 50 MG PO CAPS: 100.0000 mg | ORAL_CAPSULE | Freq: Two times a day (BID) | ORAL | 0 refills | Status: AC

## 2017-06-05 MED ORDER — PREDNISONE 50 MG PO TABS: ORAL_TABLET | ORAL | 0 refills | Status: DC

## 2017-06-05 NOTE — Patient Instructions (Addendum)
It was nice meeting you today Ms. Theresa [name]!  You are having a COPD exacerbation, which means that your symptoms of COPD (shortness of breath, cough) are worse right now.  We are prescribing an antibiotic called Doxycycline, which you will need to take twice daily (2 pills each time.  We are also prescribing prednisone 50 mg once daily for 5 days and Spiriva, which is a long-acting medicine that people with COPD take every day.  When you pick up Spiriva, you can ask the pharmacist to show you how to use it if you need to.    You can also use Tylenol, ibuprofen, and mucinex to help your symptoms.    I would like for you to be seen within one week to check on your progress.  I hope you feel better soon!  If you have any questions or concerns, please feel free to call the clinic.   Be well,  Dr. Shan Levans  Chronic Obstructive Pulmonary Disease Exacerbation Chronic obstructive pulmonary disease (COPD) is a long-term (chronic) condition that affects the lungs. COPD is a general term that can be used to describe many different lung problems that cause lung swelling (inflammation) and limit airflow, including chronic bronchitis and emphysema. COPD exacerbations are episodes when breathing symptoms become much worse and require extra treatment. COPD exacerbations are usually caused by infections. Without treatment, COPD exacerbations can be severe and even life threatening. Frequent COPD exacerbations can cause further damage to the lungs. What are the causes? This condition may be caused by:  Respiratory infections, including viral and bacterial infections.  Exposure to smoke.  Exposure to air pollution, chemical fumes, or dust.  Things that give you an allergic reaction (allergens).  Not taking your usual COPD medicines as directed.  Underlying medical problems, such as congestive heart failure or infections not involving the lungs.  In many cases, the cause (trigger) of this condition is not  known. What increases the risk? The following factors may make you more likely to develop this condition:  Smoking cigarettes.  Old age.  Frequent prior COPD exacerbations.  What are the signs or symptoms? Symptoms of this condition include:  Increased coughing.  Increased production of mucus from your lungs (sputum).  Increased wheezing.  Increased shortness of breath.  Rapid or labored breathing.  Chest tightness.  Less energy than usual.  Sleep disruption from symptoms.  Confusion or increased sleepiness.  Often these symptoms happen or get worse even with the use of medicines. How is this diagnosed? This condition is diagnosed based on:  Your medical history.  A physical exam.  You may also have tests, including:  A chest X-ray.  Blood tests.  Lung (pulmonary) function tests.  How is this treated? Treatment for this condition depends on the severity and cause of the symptoms. You may need to be admitted to a hospital for treatment. Some of the treatments commonly used to treat COPD exacerbations are:  Antibiotic medicines. These may be used for severe exacerbations caused by a lung infection, such as pneumonia.  Bronchodilators. These are inhaled medicines that expand the air passages and allow increased airflow.  Steroid medicines. These act to reduce inflammation in the airways. They may be given with an inhaler, taken by mouth, or given through an IV tube inserted into one of your veins.  Supplemental oxygen therapy.  Airway clearing techniques, such as noninvasive ventilation (NIV) and positive expiratory pressure (PEP). These provide respiratory support through a mask or other noninvasive device. An example  of this would be using a continuous positive airway pressure (CPAP) machine to improve delivery of oxygen into your lungs.  Follow these instructions at home: Medicines  Take over-the-counter and prescription medicines only as told by your  health care provider. It is important to use correct technique with inhaled medicines.  If you were prescribed an antibiotic medicine or oral steroid, take it as told by your health care provider. Do not stop taking the medicine even if you start to feel better. Lifestyle  Eat a healthy diet.  Exercise regularly.  Get plenty of sleep.  Avoid exposure to all substances that irritate the airway, especially to tobacco smoke.  Wash your hands often with soap and water to reduce the risk of infection. If soap and water are not available, use hand sanitizer.  During flu season, avoid enclosed spaces that are crowded with people. General instructions  Drink enough fluid to keep your urine clear or pale yellow (unless you have a medical condition that requires fluid restriction).  Use a cool mist vaporizer. This humidifies the air and makes it easier for you to clear your chest when you cough.  If you have a home nebulizer and oxygen, continue to use them as told by your health care provider.  Keep all follow-up visits as told by your health care provider. This is important. How is this prevented?  Stay up-to-date on pneumococcal and influenza (flu) vaccines. A flu shot is recommended every year to help prevent exacerbations.  Do not use any products that contain nicotine or tobacco, such as cigarettes and e-cigarettes. Quitting smoking is very important in preventing COPD from getting worse and in preventing exacerbations from happening as often. If you need help quitting, ask your health care provider.  Follow all instructions for pulmonary rehabilitation after a recent exacerbation. This can help prevent future exacerbations.  Work with your health care provider to develop and follow an action plan. This tells you what steps to take when you experience certain symptoms. Contact a health care provider if:  You have a worsening of your regular COPD symptoms. Get help right away  if:  You have worsening shortness of breath, even when resting.  You have trouble talking.  You have severe chest pain.  You cough up blood.  You have a fever.  You have weakness, vomit repeatedly, or faint.  You feel confused.  You are not able to sleep because of your symptoms.  You have trouble doing daily activities. Summary  COPD exacerbations are episodes when breathing symptoms become much worse and require extra treatment above your normal treatment.  Exacerbations can be severe and even life threatening. Frequent COPD exacerbations can cause further damage to your lungs.  COPD exacerbations are usually triggered by infections such as the flu, colds, and even pneumonia.  Treatment for this condition depends on the severity and cause of the symptoms. You may need to be admitted to a hospital for treatment.  Quitting smoking is very important to prevent COPD from getting worse and to prevent exacerbations from happening as often. This information is not intended to replace advice given to you by your health care provider. Make sure you discuss any questions you have with your health care provider. Document Released: 12/26/2006 Document Revised: 04/04/2016 Document Reviewed: 04/04/2016 Elsevier Interactive Patient Education  [employer].

## 2017-06-05 NOTE — Assessment & Plan Note (Addendum)
Patient's symptoms appear consistent with a COPD exacerbation that is mild enough to be treated on an outpatient basis.  Will prescribe: - Spiriva 18 mcg two inhalations daily (should be continued long-term) - doxycycline 100 mg BID x 7 days - prednisone 50 mg x 5 days Advised patient that she can use Tylenol or Mucinex for symptom relief as well.  Would like for her to follow up within one week.

## 2017-06-05 NOTE — Progress Notes (Signed)
Subjective:    Theresa [name] - 52 y.o. female MRN [id number]  Date of birth: 10-09-1965  HPI  Theresa [name] is here for shortness of breath, cough, and congestion.  She says she felt congested for a couple of weeks, but her symptoms suddenly worsened two days ago.  She has had an increase in coughing, so much so that her ribs now hurt.  Her ears feel full, and her throat hurts as well.  She denies muscle aches.  She endorses a subjective fever and had a temperature of 100.1 at home yesterday.  Her shortness of breath has made it difficult for her to walk or talk.  She has tried to smoke, but this is impossible due to her shortness of breath.  She has taken two puffs of albuterol four times and it has not helped.  She has not noticed increased swelling in her legs and denies a history of heart problems.  Health Maintenance:  Health Maintenance Due  Topic Date Due  . HIV Screening  06/03/1978  . MAMMOGRAM  06/02/2013  . COLONOSCOPY  06/02/2013  . OPHTHALMOLOGY EXAM  01/11/2014  . FOOT EXAM  05/05/2017    -  reports that she has been smoking cigarettes.  She started smoking about 39 years ago. She has a 6.48 pack-year smoking history. She has never used smokeless tobacco. - Review of Systems: Per HPI. - Past Medical History: Patient Active Problem List   Diagnosis Date Noted  . COPD (chronic obstructive pulmonary disease) ([place]) 06/05/2017  . COPD exacerbation ([place]) 06/05/2017  . Breast pain, right 12/01/2016  . S/P insertion of spinal cord stimulator 02/03/2016  . Polycythemia, secondary 11/24/2013  . S/P lumbar spinal fusion 07/03/2013  . Obstructive sleep apnea 12/21/2012  . Hirsutism 12/13/2011  . Blindness, legal   . Migraine 01/24/2011  . Bronchogenic cancer of left lung ([place]) 01/20/2011  . Glaucoma   . Retinitis pigmentosa   . Chronic eczema   . Neuropathic pain 09/29/2010  . Chronic kidney disease (CKD), stage III (moderate) ([place]) 05/17/2010  . Hypercholesterolemia  04/17/2009  . Essential hypertension 04/17/2009  . Morbid obesity ([place]) 10/02/2008  . Diabetes mellitus type 2, uncontrolled ([place]) 02/01/2007  . Major depressive disorder, recurrent episode ([place]) 05/11/2006  . TOBACCO DEPENDENCE 05/11/2006   - Medications: reviewed and updated   Objective:   Physical Exam BP 140/84 (BP Location: Right Arm, Patient Position: Sitting, Cuff Size: Large)   Pulse 78   Temp 98.9 F (37.2 C) (Oral)   Ht 5\' 2"  (1.575 m)   Wt 216 lb (98 kg)   SpO2 92%   BMI 39.51 kg/m  Gen: NAD, alert, cooperative with exam, appears short of breath, speaking in a whisper HEENT: NCAT, clear conjunctiva, oropharynx clear, supple neck, TMs clear bilaterally CV: RRR, good S1/S2, no murmur, trace pitting edema bilaterally Resp: upper airway wheezes, no rhonchi or crackles Abd: SNTND, BS present, no guarding or organomegaly Skin: no rashes, normal turgor  Neuro: no gross deficits.  Psych: good insight, alert and oriented        Assessment & Plan:   COPD exacerbation ([place]) Patient's symptoms appear consistent with a COPD exacerbation that is mild enough to be treated on an outpatient basis.  Will prescribe: - Spiriva 18 mcg two inhalations daily (should be continued long-term) - doxycycline 100 mg BID x 7 days - prednisone 50 mg x 5 days Advised patient that she can use Tylenol or Mucinex for symptom relief as well.  Would like for her to follow up within one week.    Maia Breslow, M.D. 06/05/2017, 11:52 AM PGY-1, [place]

## 2017-06-14 ENCOUNTER — Other Ambulatory Visit: Payer: Self-pay | Admitting: Family Medicine

## 2017-06-14 ENCOUNTER — Encounter: Payer: Self-pay | Admitting: Family Medicine

## 2017-06-14 DIAGNOSIS — I10 Essential (primary) hypertension: Secondary | ICD-10-CM

## 2017-06-14 DIAGNOSIS — E785 Hyperlipidemia, unspecified: Secondary | ICD-10-CM

## 2017-06-15 ENCOUNTER — Other Ambulatory Visit: Payer: Self-pay

## 2017-06-15 ENCOUNTER — Encounter: Payer: Self-pay | Admitting: Family Medicine

## 2017-06-15 ENCOUNTER — Ambulatory Visit: Payer: Medicare Other | Admitting: Family Medicine

## 2017-06-15 VITALS — BP 104/60 | HR 75 | Temp 98.5°F | Ht 62.0 in | Wt 209.2 lb

## 2017-06-15 DIAGNOSIS — F17200 Nicotine dependence, unspecified, uncomplicated: Secondary | ICD-10-CM | POA: Diagnosis not present

## 2017-06-15 DIAGNOSIS — J329 Chronic sinusitis, unspecified: Secondary | ICD-10-CM | POA: Diagnosis not present

## 2017-06-15 DIAGNOSIS — M5412 Radiculopathy, cervical region: Secondary | ICD-10-CM | POA: Diagnosis not present

## 2017-06-15 MED ORDER — VARENICLINE TARTRATE 1 MG PO TABS: 1.0000 mg | ORAL_TABLET | Freq: Two times a day (BID) | ORAL | 1 refills | Status: DC

## 2017-06-15 MED ORDER — GABAPENTIN 300 MG PO CAPS: 300.0000 mg | ORAL_CAPSULE | Freq: Three times a day (TID) | ORAL | 2 refills | Status: DC

## 2017-06-15 MED ORDER — FEXOFENADINE HCL 30 MG PO TBDP: 30.0000 mg | ORAL_TABLET | Freq: Every day | ORAL | 5 refills | Status: DC

## 2017-06-15 MED ORDER — MONTELUKAST SODIUM 10 MG PO TABS: 10.0000 mg | ORAL_TABLET | Freq: Every day | ORAL | 5 refills | Status: DC

## 2017-06-15 MED ORDER — DULOXETINE HCL 20 MG PO CPEP: 20.0000 mg | ORAL_CAPSULE | Freq: Every day | ORAL | 3 refills | Status: DC

## 2017-06-15 NOTE — Progress Notes (Signed)
Subjective:    Theresa [name] - 52 y.o. female MRN [id number]  Date of birth: 23-Apr-1965  HPI  ARIONA DESCHENE is here for sinus drainage and R shoulder/arm weakness and pain.  Sinus drainage: This has been occurring for several months and is associated with congestion and throat pain.  She has also experienced hoarseness that she thinks is due to the drainage.  She has tried flonase which has been minimally helpful.  Zyrtec and Claritin have also not given much relief.  R shoulder and arm weakness and pain: Patient has chronic weakness and pain that extends from her R shoulder to her R hand.  She experiences frequent tingling and numbness in her R arm as well.  She is already taking gabapentin without much relief.    Health Maintenance:  - Continues to smoke; has tried Chantix in the past which worked well for her, but she tends to start smoking again when in a stressful situation.  Would like to try Chantix again.  Health Maintenance Due  Topic Date Due  . HIV Screening  06/03/1978  . MAMMOGRAM  06/02/2013  . COLONOSCOPY  06/02/2013  . FOOT EXAM  05/05/2017    -  reports that she has been smoking cigarettes.  She started smoking about 39 years ago. She has a 6.48 pack-year smoking history. She has never used smokeless tobacco. - Review of Systems: Per HPI. - Past Medical History: Patient Active Problem List   Diagnosis Date Noted  . Sinusitis, chronic 06/18/2017  . Cervical radiculopathy 06/18/2017  . COPD (chronic obstructive pulmonary disease) ([place]) 06/05/2017  . COPD exacerbation ([place]) 06/05/2017  . Breast pain, right 12/01/2016  . S/P insertion of spinal cord stimulator 02/03/2016  . Polycythemia, secondary 11/24/2013  . S/P lumbar spinal fusion 07/03/2013  . Obstructive sleep apnea 12/21/2012  . Hirsutism 12/13/2011  . Blindness, legal   . Migraine 01/24/2011  . Bronchogenic cancer of left lung ([place]) 01/20/2011  . Glaucoma   . Retinitis pigmentosa   . Chronic  eczema   . Neuropathic pain 09/29/2010  . Chronic kidney disease (CKD), stage III (moderate) ([place]) 05/17/2010  . Hypercholesterolemia 04/17/2009  . Essential hypertension 04/17/2009  . Morbid obesity ([place]) 10/02/2008  . Diabetes mellitus type 2, uncontrolled ([place]) 02/01/2007  . Major depressive disorder, recurrent episode ([place]) 05/11/2006  . TOBACCO DEPENDENCE 05/11/2006   - Medications: reviewed and updated   Objective:   Physical Exam BP 104/60   Pulse 75   Temp 98.5 F (36.9 C) (Oral)   Ht 5\' 2"  (1.575 m)   Wt 209 lb 3.2 oz (94.9 kg)   SpO2 98%   BMI 38.26 kg/m  Gen: NAD, alert, cooperative with exam, well-appearing HEENT: NCAT, PERRL, clear conjunctiva, oropharynx clear, supple neck, tender to palpation of frontal and maxillary sinuses  CV: RRR, good S1/S2, no murmur Resp: no rhonchi or rales, expiratory wheezes in all lung fields Abd: SNTND, BS present, no guarding or organomegaly Skin: no rashes, normal turgor  Neuro: no gross deficits.  Psych: good insight, alert and oriented Musculoskeletal: 4/5 strength in RUE, nontender to palpation of RUE        Assessment & Plan:   Sinusitis, chronic I do not think this is a sinus infection requiring antibiotics, since this has been affecting patient for multiple months, and patient recently finished a course of doxycyline for a COPD exacerbation.  Will prescribe Singulair and Allegra to see if these alleviate patient's symptoms.  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  Will start low dose cymbalta.  Advised patient that the full affect of this medication may take a couple months to be achieved.  Refilled patient's gabapentin.  TOBACCO DEPENDENCE Will prescribe Chantix per patient request since this has worked well for her, and she would greatly benefit from tobacco cessation given her history of lung cancer and COPD.  Patient advised that tobacco cessation  will also improve her sinusitis.    Maia Breslow, M.D. 06/18/2017, 12:57 PM PGY-1, [place]

## 2017-06-15 NOTE — Patient Instructions (Signed)
It was nice seeing you today Ms. Langston!  Today, we talked about the drainage in your throat as well as your shoulder and arm pain and weakness.  The drainage that you feel is likely due to chronic sinusitis due to allergies.  We will start singulair and allegra for this today.  We can consider surgical evaluation of your sinuses in the future if they continue to bother you.  Your shoulder and arm pain and weakness are probably due to a pinched nerve in your neck.  We will try cymbalta to help alleviate this pain.  I will also refill your gabapentin.  Since tobacco use is making your sinus problems and lung health worse, we will represcribe Chantix to help you try to quit.  Most people have to try to quit about 8 times before they finally quit for good, so don't feel discouraged - you can do this!  If you have any questions or concerns, please feel free to call the clinic.   Be well,  Dr. Shan Levans

## 2017-06-18 DIAGNOSIS — J329 Chronic sinusitis, unspecified: Secondary | ICD-10-CM | POA: Insufficient documentation

## 2017-06-18 DIAGNOSIS — M5412 Radiculopathy, cervical region: Secondary | ICD-10-CM | POA: Insufficient documentation

## 2017-06-18 NOTE — Assessment & Plan Note (Signed)
I do not think this is a sinus infection requiring antibiotics, since this has been affecting patient for multiple months, and patient recently finished a course of doxycyline for a COPD exacerbation.  Will prescribe Singulair and Allegra to see if these alleviate patient's symptoms.

## 2017-06-18 NOTE — Assessment & Plan Note (Signed)
Will prescribe Chantix per patient request since this has worked well for her, and she would greatly benefit from tobacco cessation given her history of lung cancer and COPD.  Patient advised that tobacco cessation will also improve her sinusitis.

## 2017-06-18 NOTE — Assessment & Plan Note (Signed)
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  Will start low dose cymbalta.  Advised patient that the full affect of this medication may take a couple months to be achieved.  Refilled patient's gabapentin.

## 2017-06-20 ENCOUNTER — Other Ambulatory Visit: Payer: Self-pay | Admitting: *Deleted

## 2017-06-20 MED ORDER — VARENICLINE TARTRATE 1 MG PO TABS: 1.0000 mg | ORAL_TABLET | Freq: Two times a day (BID) | ORAL | 1 refills | Status: DC

## 2017-07-07 ENCOUNTER — Other Ambulatory Visit: Payer: Self-pay | Admitting: *Deleted

## 2017-07-11 MED ORDER — VARENICLINE TARTRATE 1 MG PO TABS: 1.0000 mg | ORAL_TABLET | Freq: Two times a day (BID) | ORAL | 1 refills | Status: DC

## 2017-07-11 MED ORDER — DULOXETINE HCL 20 MG PO CPEP: 20.0000 mg | ORAL_CAPSULE | Freq: Every day | ORAL | 0 refills | Status: DC

## 2017-07-12 ENCOUNTER — Other Ambulatory Visit: Payer: Self-pay | Admitting: *Deleted

## 2017-07-14 ENCOUNTER — Other Ambulatory Visit: Payer: Self-pay

## 2017-07-14 ENCOUNTER — Telehealth: Payer: Self-pay | Admitting: Family Medicine

## 2017-07-14 NOTE — Telephone Encounter (Signed)
Called patient and left message x2 to review medication, would like to speak with patient regarding indication of linzess, how often she has been taking it, etc. prior to refilling.  Rory Percy, DO PGY-1, [facility] 07/14/2017 7:51 PM

## 2017-07-14 NOTE — Telephone Encounter (Signed)
Error

## 2017-07-28 MED ORDER — LINACLOTIDE 290 MCG PO CAPS: 290.0000 ug | ORAL_CAPSULE | Freq: Every day | ORAL | 0 refills | Status: DC

## 2017-08-13 NOTE — Progress Notes (Signed)
   Subjective:   Patient ID: Theresa [name]    DOB: 12-15-1965, 52 y.o. female   MRN: [id number]  Theresa [name] is a 52 y.o. female with a history of HTN, OSA, COPD, DM2, CKD, Obesity, MDD here for   Diabetes, Type 2 - Last A1c 7.9 - Medications: Lantus 20u (instructed to increase 1u for every CBG>150), metformin 1000mg  BID (once daily), Lipitor 20mg  - Compliance: taking metformin once daily instead of BID, taking Lantus PRN when "sugars are high" - Checking BG at home: yes, 140-170 - Diet: no changes since last visit, was supposed to make appointment with Nutrition, has not done yet. - Eye exam: 3/19, no retinopathy - Denies symptoms of hypoglycemia, polyuria, polydipsia, foot ulcers/trauma - has chronic numbness and tingling in hands/feet, blurry vision.  Review of Systems:  Per HPI.   [place]: reviewed. Smoking status reviewed. Medications reviewed.  Objective:   BP 134/84   Pulse 69   Temp 98.8 F (37.1 C) (Oral)   Ht 5\' 2"  (1.575 m)   Wt 95.7 kg (211 lb)   SpO2 98%   BMI 38.59 kg/m  Vitals and nursing note reviewed.  General: obese female in no acute distress with non-toxic appearance HEENT: normocephalic, atraumatic, moist mucous membranes CV: regular rate and rhythm without murmurs, rubs, or gallops Lungs: clear to auscultation bilaterally with normal work of breathing Skin: warm, dry, no rashes or lesions Extremities: warm and well perfused, normal tone MSK: ROM grossly intact, strength intact, gait normal Neuro: Alert and oriented, speech normal  Assessment & Plan:   Diabetes mellitus type 2, uncontrolled A1c slightly improved, though largely unchanged from previous. Discussed weight loss as benefit. Encouraged compliance with medication as prescribed. No medication changes today as I would like to see where her diabetic control is once she takes medication as directed. Will recheck A1c in 3 months but advised her to follow up in 1 month when she returns from her  trip to see how progress is going.  Morbid obesity Discussed weight loss today. Patient reports trying to lose weight however no changes in diet or exercise since last visit. Again encouraged patient to make appointment with Nutrition. Handout provided last visit. Believe weight loss will help better control many of her chronic conditions including diabetes, HTN, OSA.  Orders Placed This Encounter  Procedures  . MM Digital Screening    Standing Status:   Future    Standing Expiration Date:   10/15/2018    Order Specific Question:   Reason for Exam (SYMPTOM  OR DIAGNOSIS REQUIRED)    Answer:   screening for breast cancer    Order Specific Question:   Is the patient pregnant?    Answer:   No    Order Specific Question:   Preferred imaging location?    Answer:   [facility]  . POCT glycosylated hemoglobin (Hb A1C)    Rory Percy, DO PGY-1, [facility] 08/14/2017 12:25 PM

## 2017-08-14 ENCOUNTER — Other Ambulatory Visit: Payer: Self-pay

## 2017-08-14 ENCOUNTER — Encounter: Payer: Self-pay | Admitting: Family Medicine

## 2017-08-14 ENCOUNTER — Ambulatory Visit: Payer: Medicare Other | Admitting: Family Medicine

## 2017-08-14 VITALS — BP 134/84 | HR 69 | Temp 98.8°F | Ht 62.0 in | Wt 211.0 lb

## 2017-08-14 DIAGNOSIS — E1165 Type 2 diabetes mellitus with hyperglycemia: Secondary | ICD-10-CM

## 2017-08-14 DIAGNOSIS — Z1231 Encounter for screening mammogram for malignant neoplasm of breast: Secondary | ICD-10-CM | POA: Diagnosis not present

## 2017-08-14 DIAGNOSIS — Z1239 Encounter for other screening for malignant neoplasm of breast: Secondary | ICD-10-CM

## 2017-08-14 LAB — POCT GLYCOSYLATED HEMOGLOBIN (HGB A1C): HbA1c, POC (controlled diabetic range): 7.7 % — AB (ref 0.0–7.0)

## 2017-08-14 MED ORDER — CLOBETASOL PROPIONATE 0.05 % EX CREA: 1.0000 "application " | TOPICAL_CREAM | Freq: Two times a day (BID) | CUTANEOUS | 1 refills | Status: DC

## 2017-08-14 NOTE — Assessment & Plan Note (Addendum)
A1c slightly improved, though largely unchanged from previous. Discussed weight loss as benefit. Encouraged compliance with medication as prescribed. No medication changes today as I would like to see where her diabetic control is once she takes medication as directed. Will recheck A1c in 3 months but advised her to follow up in 1 month when she returns from her trip to see how progress is going.

## 2017-08-14 NOTE — Patient Instructions (Addendum)
It was great to see you!  For your diabetes,  - It is important to take your Lantus 20u EVERY DAY, not just when you're not feeling good. - Take metformin TWICE daily every day. - We will recheck your A1c in 3 months but I would like to see you back whenever you get back from your trip to see how things are going - Weight loss will be very helpful in gaining better control of your diabetes as well. - Please call our nutritionist, Dr. Jenne Campus, to set up an appointment. [phone].  Have a great trip and we'll see you when you get back!  Take care and seek immediate care sooner if you develop any concerns.   Dr. [facility]

## 2017-08-14 NOTE — Assessment & Plan Note (Signed)
Discussed weight loss today. Patient reports trying to lose weight however no changes in diet or exercise since last visit. Again encouraged patient to make appointment with Nutrition. Handout provided last visit. Believe weight loss will help better control many of her chronic conditions including diabetes, HTN, OSA.

## 2017-10-09 ENCOUNTER — Other Ambulatory Visit: Payer: Self-pay | Admitting: Family Medicine

## 2017-10-29 NOTE — Progress Notes (Signed)
Subjective:   Patient ID: Theresa [name]    DOB: 1965-12-20, 52 y.o. female   MRN: [id number]  Theresa [name] is a 52 y.o. female with a history of COPD, DM2, CKD3, depression here for   Diabetes, Type 2 - Last A1c 7.7 08/14/17 - Medications: Lantus 20u (last instructed to increase 1u for every CBG>150, hasn't had to), metformin 1000mg  BID  - Checking BG at home: yes 83 in the am, sometimes 117. Doesn't write them down. - Diet: not eating as much due to grief - Eye exam: 05/2017, no retinopathy - Foot exam: due  Depression with Grief episode Reports mom recently was placed on hospice. She lives in [place] and is leaving tomorrow to meet her sister to help get her mother's affairs in order. She is requesting refills on her chronic medications to have as she anticipates being in [place] until her mother passes. Also requesting dose increase of antidepressant, Cymbalta which she takes for cervical myalgia. Previously on Prozac but no longer taking. She is currently not handling her mother's hospice state very well. States not much helps her mood during this time. Also not sleeping very well. Denies TV use before bed. Typically reads before bed but currently having a hard time concentrating. Also not eating as much. Abdominal cramping getting worse as she is out of Linzess but thinks it is exacerbated by being upset.  Review of Systems:  Per HPI.  [place], medications and smoking status reviewed.  Objective:   BP 106/65   Pulse 69   Temp 98.8 F (37.1 C)   Ht 5\' 2"  (1.575 m)   Wt 203 lb 12.8 oz (92.4 kg)   SpO2 97%   BMI 37.28 kg/m  Vitals and nursing note reviewed.  General: obese female, tearful on exam  HEENT: normocephalic, atraumatic, moist mucous membranes CV: regular rate and rhythm without murmurs, rubs, or gallops Lungs: clear to auscultation bilaterally with normal work of breathing Skin: warm, dry, no rashes or lesions Extremities: warm and well perfused, normal tone MSK:  ROM grossly intact, strength intact, gait normal Neuro: Alert and oriented, speech normal Psych: appropriately dressed and well groomed. No SI.  Depression screen PHQ 2/9 10/31/2017  Decreased Interest 3  Down, Depressed, Hopeless 3  PHQ - 2 Score 6  Altered sleeping 3  Tired, decreased energy 3  Change in appetite 3  Feeling bad or failure about yourself  3  Trouble concentrating 3  Moving slowly or fidgety/restless 1  Suicidal thoughts 0  PHQ-9 Score 22  Difficult doing work/chores -  Some recent data might be hidden    Assessment & Plan:   Major depressive disorder, recurrent episode Currently dealing with grief over chronically ill mother who is on hospice. Requesting dose increase of Cymbalta which she takes for cervical myalgia. Previously on Prozac but not currently taking. PHQ9 22 today without suicidal features. [place] warm hand off offered today but patient declined. Will increase Cymbalta dose to 60mg  daily for therapeutic dosing for depression. Instructed patient to follow up as soon as she returns from her trip. Strict return precautions and emergency precautions given.  Diabetes mellitus type 2, uncontrolled Reports hasn't had to use Lantus dose increase as instructed since last visit. Suspect weight loss (8lbs since 08/2017) from grief has contributed to better glycemic control, unfortunately. Will not make any medication adjustments at this time. Instructed patient to follow up after returning from her trip with documented CBGs. Will obtain A1c at that time.  No  orders of the defined types were placed in this encounter.  Meds ordered this encounter  Medications  . albuterol (PROVENTIL HFA;VENTOLIN HFA) 108 (90 Base) MCG/ACT inhaler    Sig: Inhale 2 puffs into the lungs every 4 (four) hours as needed for wheezing or shortness of breath.    Dispense:  1 Inhaler    Refill:  1  . linaclotide (LINZESS) 290 MCG CAPS capsule    Sig: Take 1 capsule (290 mcg total) by mouth  daily. Follow-up with PCP for further refills    Dispense:  30 capsule    Refill:  0  . sucralfate (CARAFATE) 1 GM/10ML suspension    Sig: Take 10 mLs (1 g total) by mouth 2 (two) times daily.    Dispense:  600 mL    Refill:  0  . DULoxetine (CYMBALTA) 60 MG capsule    Sig: Take 1 capsule (60 mg total) by mouth daily.    Dispense:  90 capsule    Refill:  0    Rory Percy, DO PGY-2, [facility] 10/31/2017 8:10 PM

## 2017-10-31 ENCOUNTER — Ambulatory Visit: Payer: Medicare Other | Admitting: Family Medicine

## 2017-10-31 ENCOUNTER — Encounter: Payer: Self-pay | Admitting: Family Medicine

## 2017-10-31 VITALS — BP 106/65 | HR 69 | Temp 98.8°F | Ht 62.0 in | Wt 203.8 lb

## 2017-10-31 DIAGNOSIS — F331 Major depressive disorder, recurrent, moderate: Secondary | ICD-10-CM | POA: Diagnosis not present

## 2017-10-31 DIAGNOSIS — F4321 Adjustment disorder with depressed mood: Secondary | ICD-10-CM | POA: Diagnosis not present

## 2017-10-31 DIAGNOSIS — E1165 Type 2 diabetes mellitus with hyperglycemia: Secondary | ICD-10-CM

## 2017-10-31 MED ORDER — DULOXETINE HCL 60 MG PO CPEP: 60.0000 mg | ORAL_CAPSULE | Freq: Every day | ORAL | 0 refills | Status: DC

## 2017-10-31 MED ORDER — ALBUTEROL SULFATE HFA 108 (90 BASE) MCG/ACT IN AERS
2.0000 | INHALATION_SPRAY | RESPIRATORY_TRACT | 1 refills | Status: DC | PRN
Start: 2017-10-31 — End: 2018-11-12

## 2017-10-31 MED ORDER — SUCRALFATE 1 GM/10ML PO SUSP: 1.0000 g | Freq: Two times a day (BID) | ORAL | 0 refills | Status: DC

## 2017-10-31 MED ORDER — LINACLOTIDE 290 MCG PO CAPS: 290.0000 ug | ORAL_CAPSULE | Freq: Every day | ORAL | 0 refills | Status: DC

## 2017-10-31 NOTE — Assessment & Plan Note (Addendum)
Currently dealing with grief over chronically ill mother who is on hospice. Requesting dose increase of Cymbalta which she takes for cervical myalgia. Previously on Prozac but not currently taking. PHQ9 22 today without suicidal features. [place] warm hand off offered today but patient declined. Will increase Cymbalta dose to 60mg  daily for therapeutic dosing for depression. Instructed patient to follow up as soon as she returns from her trip. Strict return precautions and emergency precautions given.

## 2017-10-31 NOTE — Assessment & Plan Note (Addendum)
Reports hasn't had to use Lantus dose increase as instructed since last visit. Suspect weight loss (8lbs since 08/2017) from grief has contributed to better glycemic control, unfortunately. Will not make any medication adjustments at this time. Instructed patient to follow up after returning from her trip with documented CBGs. Will obtain A1c at that time.

## 2017-10-31 NOTE — Patient Instructions (Signed)
It was great to see you! I am so sorry your mom is on hospice and you are going through so much right now. I refilled the medications you asked. If you need additional refills, be sure to call the clinic with the desired pharmacy.  Our plans for today:  - I increased your antidepressant dose. I would like to see you back whenever you get back from your trip to see how you are doing. - Let us know if you need any additional refills.  Take care and seek immediate care sooner if you develop any concerns.   Dr. [facility]

## 2018-01-05 ENCOUNTER — Other Ambulatory Visit: Payer: Self-pay | Admitting: Family Medicine

## 2018-01-11 ENCOUNTER — Ambulatory Visit: Payer: Medicare Other

## 2018-01-12 ENCOUNTER — Other Ambulatory Visit: Payer: Self-pay

## 2018-01-12 MED ORDER — DULOXETINE HCL 60 MG PO CPEP: 60.0000 mg | ORAL_CAPSULE | Freq: Every day | ORAL | 0 refills | Status: DC

## 2018-02-25 NOTE — Progress Notes (Signed)
Subjective:   Patient ID: Theresa [name]    DOB: 05-26-1965, 52 y.o. female   MRN: [id number]  Theresa [name] is a 52 y.o. female with a history of HTN, migraine, h/o lung cancer, OSA, COPD, DM2, cervical radiculopathy, tobacco use, morbid obesity, HLD, glaucoma, MDD here for   Congestion Patient endorses ongoing head and chest congestion since 12/26/2022 when her mom passed away.  Endorses minimally productive cough with thick clear yellowish mucus.  Has used albuterol inhaler without much relief, is getting better relief with granddaughters albuterol nebulizer machine.  She states she initially had to use it every 2-4 hours, now is using about twice per day.  Endorses associated stuffy nose, headaches, sore throat, chills.  Denies any sick contacts.  Denies fever.  She is currently not taking Spiriva as it is too expensive.  She is also not taking Singulair.  She has been taking NyQuil and Benadryl which have been helping.  States Mucinex does not help.  Diabetes, Type 2 - Last A1c 7.7 08/2017 - Medications: Lantus 20u, metformin 1000mg  BID - Compliance: good - Checking BG at home: 98-120s - Diet: Meals per day varies ever since the passing of her mom.  Sometimes forces herself to eat given she is on insulin. - Exercise: None - Eye exam: 05/2017 no retinopathy. +glaucoma, retinitis pigmentosa - Foot exam: due - Microalbumin: N/A - Denies symptoms of polyuria, foot ulcers/trauma. - endorses some tingling in hands, polydipsia. Was nauseous last night, has not been checking CBG when she is nauseous.  Hypertension: - Medications: amlodipine 10mg  daily, coreg 25mg  BID, lisinopril 5mg  - Compliance: good - Checking BP at home: no - Denies any vision changes, LE edema, medication SEs, or symptoms of hypotension  Review of Systems:  Per HPI.  [place], medications and smoking status reviewed.  Objective:   BP 140/72   Pulse 78   Temp 98.4 F (36.9 C) (Oral)   Ht 5\' 2"  (1.575 m)   Wt 206 lb  (93.4 kg)   SpO2 97%   BMI 37.68 kg/m  Vitals and nursing note reviewed.  General: Obese female, in no acute distress with non-toxic appearance HEENT: normocephalic, atraumatic, moist mucous membranes.  TMs clear bilaterally without bulging, erythema, purulence.  Oropharynx clear without erythema or exudate. CV: regular rate and rhythm without murmurs, rubs, or gallops, no lower extremity edema Lungs: clear to auscultation bilaterally with normal work of breathing.  No rales or wheezes.  Appropriate saturation on room air. Abdomen: soft, non-tender, non-distended, normoactive bowel sounds Skin: warm, dry, no rashes or lesions Extremities: warm and well perfused, normal tone MSK: ROM grossly intact, strength intact, gait normal Neuro: Alert and oriented, speech normal  Assessment & Plan:   Cough Lungs CTA B without rales or wheezing, however productive cough during exam.  Will provide nebulizer given she has had better relief with this.  If develops fever or worsening symptoms, return to clinic for reevaluation.  Major depressive disorder, recurrent episode States she is doing better since increase in Cymbalta at last visit, however is experiencing grief from passing of her mother in December 26, 2022.  Has good support system with family and prayer.  Instructed to follow-up at earliest convenience for updated PHQ 9 and possible medication adjustment.  No SI/HI.  Essential hypertension No medication changes made today.  Diabetes mellitus type 2, uncontrolled A1c 6.6 today.  Suspect better glycemic control related to decrease in appetite from grief.  Will have patient decrease Lantus dose to 15 units  daily and follow-up in 1 month.  Foot exam completed today without abnormalities.  Orders Placed This Encounter  Procedures  . Ambulatory referral to Gastroenterology    Referral Priority:   Routine    Referral Type:   Consultation    Referral Reason:   Specialty Services Required    Number of  Visits Requested:   1  . HgB A1c   Meds ordered this encounter  Medications  . lisinopril (PRINIVIL,ZESTRIL) 5 MG tablet    Sig: Take 0.5 tablets (2.5 mg total) by mouth daily.    Dispense:  45 tablet    Refill:  2  . gabapentin (NEURONTIN) 300 MG capsule    Sig: Take 1 capsule (300 mg total) by mouth 3 (three) times daily.    Dispense:  90 capsule    Refill:  2  . linaclotide (LINZESS) 290 MCG CAPS capsule    Sig: Take 1 capsule (290 mcg total) by mouth daily. Follow-up with PCP for further refills    Dispense:  30 capsule    Refill:  0  . omeprazole (PRILOSEC) 20 MG capsule    Sig: Take 1 capsule (20 mg total) by mouth every morning.    Dispense:  90 capsule    Refill:  2  . Insulin Pen Needle (PEN NEEDLES) 32G X 4 MM MISC    Sig: 1 Stick by Does not apply route daily.    Dispense:  100 each    Refill:  1  . DISCONTD: albuterol (PROVENTIL) (5 MG/ML) 0.5% nebulizer solution    Sig: Take 1 mL (5 mg total) by nebulization every 6 (six) hours as needed for wheezing or shortness of breath.    Dispense:  20 mL    Refill:  12  . albuterol (PROVENTIL) (2.5 MG/3ML) 0.083% nebulizer solution    Sig: Take 6 mLs (5 mg total) by nebulization every 6 (six) hours as needed for wheezing or shortness of breath.    Dispense:  150 mL    Refill:  [place], DO PGY-2, [facility] 02/26/2018 12:29 PM

## 2018-02-26 ENCOUNTER — Ambulatory Visit (INDEPENDENT_AMBULATORY_CARE_PROVIDER_SITE_OTHER): Payer: Medicare Other | Admitting: Family Medicine

## 2018-02-26 ENCOUNTER — Other Ambulatory Visit: Payer: Self-pay

## 2018-02-26 ENCOUNTER — Telehealth: Payer: Self-pay | Admitting: Family Medicine

## 2018-02-26 ENCOUNTER — Encounter: Payer: Self-pay | Admitting: Family Medicine

## 2018-02-26 VITALS — BP 140/72 | HR 78 | Temp 98.4°F | Ht 62.0 in | Wt 206.0 lb

## 2018-02-26 DIAGNOSIS — Z1211 Encounter for screening for malignant neoplasm of colon: Secondary | ICD-10-CM

## 2018-02-26 DIAGNOSIS — R05 Cough: Secondary | ICD-10-CM

## 2018-02-26 DIAGNOSIS — Z794 Long term (current) use of insulin: Secondary | ICD-10-CM

## 2018-02-26 DIAGNOSIS — F331 Major depressive disorder, recurrent, moderate: Secondary | ICD-10-CM

## 2018-02-26 DIAGNOSIS — I10 Essential (primary) hypertension: Secondary | ICD-10-CM | POA: Diagnosis not present

## 2018-02-26 DIAGNOSIS — E1165 Type 2 diabetes mellitus with hyperglycemia: Secondary | ICD-10-CM | POA: Diagnosis not present

## 2018-02-26 DIAGNOSIS — J449 Chronic obstructive pulmonary disease, unspecified: Secondary | ICD-10-CM | POA: Diagnosis not present

## 2018-02-26 DIAGNOSIS — R059 Cough, unspecified: Secondary | ICD-10-CM

## 2018-02-26 LAB — POCT GLYCOSYLATED HEMOGLOBIN (HGB A1C): HbA1c, POC (controlled diabetic range): 6.6 % (ref 0.0–7.0)

## 2018-02-26 MED ORDER — LINACLOTIDE 290 MCG PO CAPS: 290.0000 ug | ORAL_CAPSULE | Freq: Every day | ORAL | 0 refills | Status: DC

## 2018-02-26 MED ORDER — ALBUTEROL SULFATE (2.5 MG/3ML) 0.083% IN NEBU: 5.0000 mg | INHALATION_SOLUTION | Freq: Four times a day (QID) | RESPIRATORY_TRACT | 1 refills | Status: AC | PRN

## 2018-02-26 MED ORDER — TIOTROPIUM BROMIDE MONOHYDRATE 18 MCG IN CAPS: 18.0000 ug | ORAL_CAPSULE | Freq: Every day | RESPIRATORY_TRACT | 11 refills | Status: DC

## 2018-02-26 MED ORDER — GABAPENTIN 300 MG PO CAPS: 300.0000 mg | ORAL_CAPSULE | Freq: Three times a day (TID) | ORAL | 2 refills | Status: DC

## 2018-02-26 MED ORDER — ALBUTEROL SULFATE (5 MG/ML) 0.5% IN NEBU: 5.0000 mg | INHALATION_SOLUTION | Freq: Four times a day (QID) | RESPIRATORY_TRACT | 12 refills | Status: DC | PRN

## 2018-02-26 MED ORDER — PEN NEEDLES 32G X 4 MM MISC: 1.0000 | Freq: Every day | 1 refills | Status: DC

## 2018-02-26 MED ORDER — OMEPRAZOLE 20 MG PO CPDR: 20.0000 mg | DELAYED_RELEASE_CAPSULE | ORAL | 2 refills | Status: DC

## 2018-02-26 MED ORDER — LISINOPRIL 5 MG PO TABS: 2.5000 mg | ORAL_TABLET | Freq: Every day | ORAL | 2 refills | Status: DC

## 2018-02-26 NOTE — Addendum Note (Signed)
Addended by: Myles Gip on: 02/26/2018 01:40 PM   Modules accepted: Orders

## 2018-02-26 NOTE — Patient Instructions (Addendum)
It was great to see you!  Our plans for today:  - Follow up soon for your depression. - Have your daughter and husband bring by their FMLA paperwork to be filled out. - We will call you if we need to make adjustments to your diabetic regimen. - We sent your refills to the pharmacy.  Take care and seek immediate care sooner if you develop any concerns.   Dr. [facility]   Diet Recommendations for Diabetes   1. Eat at least 3 meals and 1-2 snacks per day. Never go more than 4-5 hours while awake without eating. Eat breakfast within the first hour of getting up.   2. Limit starchy foods to TWO per meal and ONE per snack. ONE portion of a starchy  food is equal to the following:   - ONE slice of bread (or its equivalent, such as half of a hamburger bun).   - 1/2 cup of a "scoopable" starchy food such as potatoes or rice.   - 15 grams of Total Carbohydrate as shown on food label.  3. Include at every meal: a protein food, a carb food, and vegetables and/or fruit.   - Obtain twice the volume of vegetables as protein or carbohydrate foods for both lunch and dinner.   - Fresh or frozen vegetables are best.   - Keep frozen vegetables on hand for a quick vegetable serving.       Starchy (carb) foods: Bread, rice, pasta, potatoes, corn, cereal, grits, crackers, bagels, muffins, all baked goods.  (Fruits, milk, and yogurt also have carbohydrate, but most of these foods will not spike your blood sugar as most starchy foods will.)  A few fruits do cause high blood sugars; use small portions of bananas (limit to 1/2 at a time), grapes, watermelon, oranges, and most tropical fruits.    Protein foods: Meat, fish, poultry, eggs, dairy foods, and beans such as pinto and kidney beans (beans also provide carbohydrate).       Here is an example of what a healthy plate looks like:    ? Make half your plate fruits and vegetables.     ? Focus on whole fruits.     ? Vary your  veggies.  ? Make half your grains whole grains. -     ? Look for the word "whole" at the beginning of the ingredients list    ? Some whole-grain ingredients include whole oats, whole-wheat flour,        whole-grain corn, whole-grain brown rice, and whole rye.  ? Move to low-fat and fat-free milk or yogurt.  ? Vary your protein routine. - Meat, fish, poultry (chicken, [place]), eggs, beans (kidney, pinto), dairy.  ? Drink and eat less sodium, saturated fat, and added sugars.

## 2018-02-26 NOTE — Telephone Encounter (Addendum)
Called patient to update on A1c but had to LVM.  A1c 6.6 today.  Suspect better glycemic control related to decrease in appetite from grief.  Will have patient decrease Lantus dose to 15 units daily and follow-up in 1 month.  She should make an appointment soon to f/u on depression. Also, changed spiriva to once daily instead of twice daily, I think her insurance may cover this better. If not, let us know and I can reach out to pharmacy.  Rory Percy, DO PGY-2, [facility] 02/26/2018 12:32 PM

## 2018-02-26 NOTE — Telephone Encounter (Signed)
error 

## 2018-02-26 NOTE — Assessment & Plan Note (Signed)
States she is doing better since increase in Cymbalta at last visit, however is experiencing grief from passing of her mother in October.  Has good support system with family and prayer.  Instructed to follow-up at earliest convenience for updated PHQ 9 and possible medication adjustment.  No SI/HI.

## 2018-02-26 NOTE — Assessment & Plan Note (Signed)
No medication changes made today.

## 2018-02-26 NOTE — Assessment & Plan Note (Signed)
A1c 6.6 today.  Suspect better glycemic control related to decrease in appetite from grief.  Will have patient decrease Lantus dose to 15 units daily and follow-up in 1 month.  Foot exam completed today without abnormalities.

## 2018-02-26 NOTE — Assessment & Plan Note (Signed)
Lungs CTA B without rales or wheezing, however productive cough during exam.  Will provide nebulizer given she has had better relief with this.  If develops fever or worsening symptoms, return to clinic for reevaluation.

## 2018-02-27 NOTE — Telephone Encounter (Signed)
LM for patient to call back.  Will try again later today.  Jazmin Hartsell,CMA

## 2018-03-02 ENCOUNTER — Telehealth: Payer: Self-pay

## 2018-03-02 NOTE — Telephone Encounter (Signed)
See phone note from 02-26-18.  Lenae Wherley,CMA

## 2018-03-02 NOTE — Telephone Encounter (Signed)
Patient left message she is returning a call to [place]. No message found.  Call back is [phone]  Danley Danker, RN [facility])

## 2018-03-02 NOTE — Telephone Encounter (Signed)
LM for patient to call me back.  Daizee Firmin,CMA

## 2018-03-02 NOTE — Telephone Encounter (Signed)
Patient informed of results and changes made to her medication.  She is aware that she needs to follow up next month and will call back to do that.  Jazmin Hartsell,CMA

## 2018-03-21 ENCOUNTER — Encounter: Payer: Self-pay | Admitting: Internal Medicine

## 2018-03-29 ENCOUNTER — Other Ambulatory Visit: Payer: Self-pay | Admitting: Family Medicine

## 2018-03-29 MED ORDER — GABAPENTIN 300 MG PO CAPS: ORAL_CAPSULE | ORAL | 1 refills | Status: DC

## 2018-03-29 NOTE — Addendum Note (Signed)
Addended by: Esau Grew on: 03/29/2018 02:02 PM   Modules accepted: Orders

## 2018-04-04 ENCOUNTER — Ambulatory Visit: Payer: Medicare Other | Admitting: Family Medicine

## 2018-04-04 ENCOUNTER — Encounter: Payer: Self-pay | Admitting: Family Medicine

## 2018-04-04 DIAGNOSIS — J441 Chronic obstructive pulmonary disease with (acute) exacerbation: Secondary | ICD-10-CM

## 2018-04-04 MED ORDER — PREDNISONE 20 MG PO TABS: 20.0000 mg | ORAL_TABLET | Freq: Every day | ORAL | 0 refills | Status: DC

## 2018-04-04 MED ORDER — AZITHROMYCIN 250 MG PO TABS: ORAL_TABLET | ORAL | 0 refills | Status: DC

## 2018-04-04 NOTE — Progress Notes (Signed)
Subjective  Theresa [name] is a 53 y.o. female is presenting with the following  URI Major symptoms: congestion with thick nasal discharge and persistent cough with fatigue and mild shortness of breath  Has been sick for weeks. Progression: started as cold but now in face and chest Medications tried: albuterol last used 3-4 days ago Sick contacts: no Patient believes may be caused by sinusitis or COPD  Symptoms Fever: mild chills Headache or face pain: yes bilaterally Tooth pain: no Sneezing: no Scratchy throat: feels things running down her throat Allergies: no Muscle aches: mild Severe fatigue: no Stiff neck: no Shortness of breath: mild when coughing alot Rash: no Sore throat or swollen glands: feels her left neck area is tender and swollen   ROS see HPI Smoking Status noted - smokes 1/4-1/2 ppd.  Not using Spiriva - can't afford   Chief Complaint noted Review of Symptoms - see HPI PMH - Smoking status noted.    Objective Vital Signs reviewed BP 140/80 (BP Location: Left Arm, Patient Position: Sitting, Cuff Size: Large)   Pulse 82   Temp 98.6 F (37 C) (Oral)   Wt 206 lb 9.6 oz (93.7 kg)   SpO2 98%   BMI 37.79 kg/m  Alert nad with frequent cough Lungs:  Normal respiratory effort, chest expands symmetrically. Lungs with mild expiratory wheeze and scattered crackles at bases Heart - Regular rate and rhythm.  No murmurs, gallops or rubs.    Neck:  No , thyromegaly, masses.  Vague fullness at the base of her R neck area. Perhaps slightly more prominent than left side. No specific adenopathy.  FROM of Neck and R shoulder.   Skin:  Intact without suspicious lesions or rashes  Throat: normal mucosa, no exudate, uvula midline, no redness  Assessments/Plans  See after visit summary for details of patient instuctions  COPD exacerbation ([place]) Recurrent.  Relatively mild.   Will treat with increased albuterol, azithromycin and prednisone medium dose.   Asked to follow up  with PCP for further work on smoking cessation and review of COPD medications

## 2018-04-04 NOTE — Assessment & Plan Note (Signed)
Recurrent.  Relatively mild.   Will treat with increased albuterol, azithromycin and prednisone medium dose.   Asked to follow up with PCP for further work on smoking cessation and review of COPD medications

## 2018-04-04 NOTE — Patient Instructions (Addendum)
Good to see you today!  Thanks for coming in.  For the sinusitis/ COPD exacerbation - Azithromycin antibiotics - Prednisone one daily - Nasal Saline use it in both nostrils every 3-4 hours  - Use the albuterol every 6 hours - Number one thing to do is to stop smoking   Should be better in 1 week and back to normal in 2-3 weeks If you get high fever or shortness of breath then come right back  Make an appointment to see Dr Ky Barban in the next month [place]

## 2018-04-08 ENCOUNTER — Other Ambulatory Visit: Payer: Self-pay | Admitting: Family Medicine

## 2018-04-08 DIAGNOSIS — E785 Hyperlipidemia, unspecified: Secondary | ICD-10-CM

## 2018-04-08 DIAGNOSIS — I10 Essential (primary) hypertension: Secondary | ICD-10-CM

## 2018-04-09 ENCOUNTER — Encounter: Payer: Self-pay | Admitting: Internal Medicine

## 2018-04-09 ENCOUNTER — Ambulatory Visit: Payer: Medicare Other | Admitting: Internal Medicine

## 2018-04-09 ENCOUNTER — Other Ambulatory Visit: Payer: Self-pay | Admitting: Family Medicine

## 2018-04-09 VITALS — BP 110/74 | HR 76 | Ht 62.5 in | Wt 207.2 lb

## 2018-04-09 DIAGNOSIS — K5900 Constipation, unspecified: Secondary | ICD-10-CM | POA: Diagnosis not present

## 2018-04-09 DIAGNOSIS — R109 Unspecified abdominal pain: Secondary | ICD-10-CM | POA: Diagnosis not present

## 2018-04-09 DIAGNOSIS — Z1211 Encounter for screening for malignant neoplasm of colon: Secondary | ICD-10-CM

## 2018-04-09 DIAGNOSIS — R1310 Dysphagia, unspecified: Secondary | ICD-10-CM | POA: Diagnosis not present

## 2018-04-09 DIAGNOSIS — K219 Gastro-esophageal reflux disease without esophagitis: Secondary | ICD-10-CM

## 2018-04-09 MED ORDER — NA SULFATE-K SULFATE-MG SULF 17.5-3.13-1.6 GM/177ML PO SOLN: 1.0000 | Freq: Once | ORAL | 0 refills | Status: AC

## 2018-04-09 NOTE — Patient Instructions (Signed)

## 2018-04-09 NOTE — Progress Notes (Signed)
HISTORY OF PRESENT ILLNESS:  Theresa [name] is a 53 y.o. female with multiple medical problems including obesity, diabetes mellitus, lung cancer status post surgery as well as chemoradiation therapy, tobacco abuse, sleep apnea, and chronic pain syndrome.  Patient has been seen in this office previously for problems with constipation and chronic intermittent abdominal pain.  She did undergo upper endoscopy in 2011 to evaluate left upper quadrant pain.  The examination was normal.  She does have a history of GERD for which she requires omeprazole 40 mg daily.  Her chief complaints today are swallowing difficulties, abdominal pain, and requesting colon cancer screening.  Patient tells me that she will have coughing or choking spells.  Also notices some dysphasia to pills.  Next, she reports ongoing chronic constipation that requires Linzess for relief.  She denies prior colonoscopy but is interested.  Review of previous radiology includes CT scan of the abdomen and pelvis with contrast October 2018 to evaluate abdominal pain.  No acute abnormalities.  Review of most recent blood work from March 22, 2017 shows abnormal lipid panel with cholesterol 250.  She did undergo abdominal ultrasound November 2018.  Unremarkable gallbladder.  She continues to smoke.  Medications reviewed.  She does have CPAP which she uses sporadically.  No oxygen therapy.  REVIEW OF SYSTEMS:  All non-GI ROS negative unless otherwise stated in the HPI except for sinus and allergy trouble, anxiety, arthritis, back pain, visual change, cough, depression, night sweats, itching, muscle cramps, headaches, skin rash, sleeping problems, ankle edema, increased thirst, excessive urination, urinary leakage, voice change, shortness of breath, breast changes  Past Medical History:  Diagnosis Date  . Anxiety   . Arthritis   . Blindness, legal    glaucoma and retinitis  pigmentosa  . Chronic eczema   . Chronic headaches 10-17-11   not migraines-  just regular  . Constipation   . Depression   . Diabetes mellitus type II    diet   . Family history of anesthesia complication    1 son had malignant hyperthermia at 75yrs  tonsils rained  . Family history of malignant hyperthermia    1 son 16 years ago  . Fibromyalgia    denies, has not been diagnosited  . GERD (gastroesophageal reflux disease)   . Glaucoma   . History of chemotherapy 01/2011 to 03/2011   concurrent w/radiation therapy  . Hx of radiation therapy 01-17-11 to 03-18-11   lung  . Hyperlipidemia   . Hypertension   . Lung cancer ([place])    lung ca dx 2010  . Malignant hyperthermia    As of 12/30/15, no personal MH history, but reported her son has a history of MH ~ 2000 (no confirmatory testing done)  . Onychomycosis   . Retinitis pigmentosa   . Shortness of breath    with exertion  . Sleep apnea    cpap 5 yrs    Past Surgical History:  Procedure Laterality Date  . ABDOMINAL HYSTERECTOMY  09/19/2001   hx. of fibroids. TAH. Ovaries remain.   [facility] Kitchen BACK SURGERY  06/2013  . CHEST TUBE INSERTION   04/01/08   L hydropneumothorax  . EYE SURGERY Bilateral    lens implant  . KNEE ARTHROSCOPY  10-17-11   bil. knee scope(left was torn ligament)  . LUMBAR LAMINECTOMY/DECOMPRESSION MICRODISCECTOMY  10/27/2011   Procedure: LUMBAR LAMINECTOMY/DECOMPRESSION MICRODISCECTOMY;  Surgeon: Johnn Hai, MD;  Location: [facility];  Service: Orthopedics;  Laterality: N/A;  L4-L5  . lung surgery  03/24/08   L vats, L thoracotomy and LUL trisegmentectomy with node dissection  . MAXIMUM ACCESS (MAS)POSTERIOR LUMBAR INTERBODY FUSION (PLIF) 1 LEVEL N/A 07/03/2013   Procedure: FOR MAXIMUM ACCESS (MAS) POSTERIOR LUMBAR INTERBODY FUSION (PLIF) 1 LEVEL;  Surgeon: Eustace Moore, MD;  Location: [facility];  Service: Neurosurgery;  Laterality: N/A;  FOR MAXIMUM ACCESS (MAS) POSTERIOR LUMBAR INTERBODY FUSION (PLIF) 1 LEVEL (L4-L5)  . PORT-A-CATH REMOVAL Right 11/26/2013   Procedure: REMOVAL PORT-A-CATH;   Surgeon: Gaye Pollack, MD;  Location: [facility];  Service: Thoracic;  Laterality: Right;  . PORTACATH PLACEMENT  02/11/2011   Procedure: INSERTION PORT-A-CATH;  Surgeon: Pierre Bali, MD;  Location: [facility];  Service: Thoracic;  Laterality: Right;  9.6Fr. Pre-attached Power Port in Right Internal Jugular  -right chest-remains inplace 10-17-11  . SPINAL CORD STIMULATOR INSERTION N/A 02/03/2016   Procedure: LUMBAR SPINAL CORD STIMULATOR INSERTION;  Surgeon: Eustace Moore, MD;  Location: [facility];  Service: Neurosurgery;  Laterality: N/A;    Social History CLOVER FEEHAN  reports that she has been smoking cigarettes. She started smoking about 40 years ago. She has a 6.48 pack-year smoking history. She has never used smokeless tobacco. She reports that she does not drink alcohol or use drugs.  family history includes Brain cancer in her maternal aunt; Depression in her daughter, maternal grandmother, mother, and son; Diabetes in her maternal grandmother; Heart disease in her mother; Malignant hyperthermia (age of onset: 6) in her son; Stroke in her maternal grandmother.  Allergies  Allergen Reactions  . Penicillins Hives and Rash    Has patient had a PCN reaction causing immediate rash, facial/tongue/throat swelling, SOB or lightheadedness with hypotension:   # # YES # #   Has patient had a PCN reaction causing severe rash involving mucus membranes or skin necrosis:  # # NO # #  Has patient had a PCN reaction that required hospitalization  # # NO # #  Has patient had a PCN reaction occurring within the last 10 years:  # # NO # #  If all of the above answers are "NO", then may proceed with Cephalosporin use.   . Codeine Nausea And Vomiting  . Morphine And Related Nausea Only  . Tomato Hives and Itching       PHYSICAL EXAMINATION: Vital signs: BP 110/74 (BP Location: Left Arm, Patient Position: Sitting, Cuff Size: Normal)   Pulse 76   Ht 5' 2.5" (1.588 m)   Wt 207 lb 4 oz (94 kg)   BMI 37.30  kg/m   Constitutional: Pleasant, obese, generally well-appearing, no acute distress Psychiatric: alert and oriented x3, cooperative Eyes: extraocular movements intact, anicteric, conjunctiva pink Mouth: oral pharynx moist, no lesions Neck: supple no lymphadenopathy Cardiovascular: heart regular rate and rhythm, no murmur Lungs: clear to auscultation bilaterally Abdomen: soft, obese, nontender, nondistended, no obvious ascites, no peritoneal signs, normal bowel sounds, no organomegaly Rectal: Deferred until colonoscopy Extremities: no clubbing, cyanosis, or lower extremity edema bilaterally Skin: no lesions on visible extremities Neuro: No focal deficits. No asterixis.  Cranial nerves intact  ASSESSMENT:  1.  Chronic GERD.  Requires omeprazole 40 mg daily to manage symptoms 2.  Coughing or choking spells when eating.  Dysphasia to pills.  Previous EGD 2011 was unremarkable.  Rule out interval change such as esophageal stricture 3.  Morbid obesity 4.  Ongoing tobacco abuse 5.  Chronic constipation 6.  Chronic abdominal pain.  Functional 7.  Colon cancer screening. 8.  Multiple medical problems including diabetes  PLAN:  1.  Reflux precautions 2.  Weight loss 3.  Stop smoking 4.  Schedule upper endoscopy to evaluate chronic GERD and symptoms of dysphasia.  Patient is high risk given her body habitus and comorbiditiesThe nature of the procedure, as well as the risks, benefits, and alternatives were carefully and thoroughly reviewed with the patient. Ample time for discussion and questions allowed. The patient understood, was satisfied, and agreed to proceed. 5.  Continue Linzess for constipation 6.  Screening colonoscopy.  The patient is high risk as stated above.The nature of the procedure, as well as the risks, benefits, and alternatives were carefully and thoroughly reviewed with the patient. Ample time for discussion and questions allowed. The patient understood, was satisfied, and  agreed to proceed. 7.  We will adjust diabetic medications for her procedure.  Hold a.m. diabetic medications

## 2018-04-17 ENCOUNTER — Ambulatory Visit (AMBULATORY_SURGERY_CENTER): Payer: Medicare Other | Admitting: Internal Medicine

## 2018-04-17 ENCOUNTER — Encounter: Payer: Self-pay | Admitting: Internal Medicine

## 2018-04-17 VITALS — BP 120/78 | HR 71 | Temp 97.8°F | Resp 15 | Ht 62.5 in | Wt 207.0 lb

## 2018-04-17 DIAGNOSIS — Z1211 Encounter for screening for malignant neoplasm of colon: Secondary | ICD-10-CM

## 2018-04-17 DIAGNOSIS — K621 Rectal polyp: Secondary | ICD-10-CM

## 2018-04-17 DIAGNOSIS — R1310 Dysphagia, unspecified: Secondary | ICD-10-CM | POA: Diagnosis not present

## 2018-04-17 DIAGNOSIS — D122 Benign neoplasm of ascending colon: Secondary | ICD-10-CM

## 2018-04-17 DIAGNOSIS — K219 Gastro-esophageal reflux disease without esophagitis: Secondary | ICD-10-CM | POA: Diagnosis not present

## 2018-04-17 DIAGNOSIS — K635 Polyp of colon: Secondary | ICD-10-CM

## 2018-04-17 DIAGNOSIS — D128 Benign neoplasm of rectum: Secondary | ICD-10-CM

## 2018-04-17 DIAGNOSIS — K449 Diaphragmatic hernia without obstruction or gangrene: Secondary | ICD-10-CM | POA: Diagnosis not present

## 2018-04-17 MED ORDER — SODIUM CHLORIDE 0.9 % IV SOLN: 500.0000 mL | Freq: Once | INTRAVENOUS | Status: DC

## 2018-04-17 NOTE — Progress Notes (Signed)
Report to PACU, RN, vss, BBS= Clear.  

## 2018-04-17 NOTE — Progress Notes (Signed)
Called to room to assist during endoscopic procedure.  Patient ID and intended procedure confirmed with present staff. Received instructions for my participation in the procedure from the performing physician.  

## 2018-04-17 NOTE — Op Note (Signed)
[facility] Patient Name: Theresa [name] Procedure Date: 04/17/2018 1:00 PM MRN: [id number] Endoscopist: Docia Chuck. Henrene Pastor , MD Age: 53 Referring MD:  Date of Birth: November 06, 1965 Gender: Female Account #: [id number] Procedure:                Upper GI endoscopy Indications:              Abdominal pain, Dysphagia (vague, coughing, choking                            sensation occasional pills), Esophageal reflux Medicines:                Monitored Anesthesia Care Procedure:                Pre-Anesthesia Assessment:                           - Prior to the procedure, a History and Physical                            was performed, and patient medications and                            allergies were reviewed. The patient's tolerance of                            previous anesthesia was also reviewed. The risks                            and benefits of the procedure and the sedation                            options and risks were discussed with the patient.                            All questions were answered, and informed consent                            was obtained. Prior Anticoagulants: The patient has                            taken no previous anticoagulant or antiplatelet                            agents. ASA Grade Assessment: II - A patient with                            mild systemic disease. After reviewing the risks                            and benefits, the patient was deemed in                            satisfactory condition to undergo the procedure.  After obtaining informed consent, the endoscope was                            passed under direct vision. Throughout the                            procedure, the patient's blood pressure, pulse, and                            oxygen saturations were monitored continuously. The                            Endoscope was introduced through the mouth, and                            advanced to  the second part of duodenum. The upper                            GI endoscopy was accomplished without difficulty.                            The patient tolerated the procedure well. Scope In: Scope Out: Findings:                 The esophagus was normal.                           The stomach was normal, save small hiatal hernia.                           The examined duodenum was normal.                           The cardia and gastric fundus were normal on                            retroflexion. Complications:            No immediate complications. Estimated Blood Loss:     Estimated blood loss: none. Impression:               1. GERD                           2. Normal EGD save small hiatal hernia. Recommendation:           1. Patient has a contact number available for                            emergencies. The signs and symptoms of potential                            delayed complications were discussed with the                            patient. Return to normal activities tomorrow.  Written discharge instructions were provided to the                            patient.                           2. Reflux precautions                           3. Weight loss                           4. Stop smoking                           5. Continue omeprazole daily                           6. Resume general medical care with your primary                            care provider                           7. Routine GI follow-up 1 year. Docia Chuck. Henrene Pastor, MD 04/17/2018 2:04:13 PM This report has been signed electronically.

## 2018-04-17 NOTE — Op Note (Signed)
[facility] Patient Name: Theresa [name] Procedure Date: 04/17/2018 1:01 PM MRN: [id number] Endoscopist: Docia Chuck. Henrene Pastor , MD Age: 53 Referring MD:  Date of Birth: 04-Jun-1965 Gender: Female Account #: [id number] Procedure:                Colonoscopy with cold snare polypectomy x 2 Indications:              Screening for colorectal malignant neoplasm,                            Incidental abdominal pain noted, Incidental                            constipation noted Medicines:                Monitored Anesthesia Care Procedure:                Pre-Anesthesia Assessment:                           - Prior to the procedure, a History and Physical                            was performed, and patient medications and                            allergies were reviewed. The patient's tolerance of                            previous anesthesia was also reviewed. The risks                            and benefits of the procedure and the sedation                            options and risks were discussed with the patient.                            All questions were answered, and informed consent                            was obtained. Prior Anticoagulants: The patient has                            taken no previous anticoagulant or antiplatelet                            agents. ASA Grade Assessment: II - A patient with                            mild systemic disease. After reviewing the risks                            and benefits, the patient was deemed in  satisfactory condition to undergo the procedure.                           After obtaining informed consent, the colonoscope                            was passed under direct vision. Throughout the                            procedure, the patient's blood pressure, pulse, and                            oxygen saturations were monitored continuously. The                            Colonoscope was  introduced through the anus and                            advanced to the the cecum, identified by                            appendiceal orifice and ileocecal valve. The                            ileocecal valve, appendiceal orifice, and rectum                            were photographed. The quality of the bowel                            preparation was excellent. The colonoscopy was                            performed without difficulty. The patient tolerated                            the procedure well. The bowel preparation used was                            SUPREP. Scope In: 1:39:53 PM Scope Out: 1:53:32 PM Scope Withdrawal Time: 0 hours 11 minutes 0 seconds  Total Procedure Duration: 0 hours 13 minutes 39 seconds  Findings:                 Two polyps were found in the rectum and ascending                            colon. The polyps were 3 to 5 mm in size. These                            polyps were removed with a cold snare. Resection                            and retrieval were complete.  The exam was otherwise without abnormality on                            direct and retroflexion views. Complications:            No immediate complications. Estimated blood loss:                            None. Estimated Blood Loss:     Estimated blood loss: none. Impression:               - Two 3 to 5 mm polyps in the rectum and in the                            ascending colon, removed with a cold snare.                            Resected and retrieved.                           - The examination was otherwise normal on direct                            and retroflexion views. Recommendation:           - Repeat colonoscopy in 5-10 years for surveillance.                           - Patient has a contact number available for                            emergencies. The signs and symptoms of potential                            delayed complications were  discussed with the                            patient. Return to normal activities tomorrow.                            Written discharge instructions were provided to the                            patient.                           - Resume previous diet.                           - Continue present medications.                           - Await pathology results. Docia Chuck. Henrene Pastor, MD 04/17/2018 1:57:34 PM This report has been signed electronically.

## 2018-04-17 NOTE — Patient Instructions (Signed)
Please read handouts provided. Reflux precautions. Continue omeprazole daily. GI follow-up in one year. Resume general medical care with your primary care provider. Weight loss. Stop smoking.      YOU HAD AN ENDOSCOPIC PROCEDURE TODAY AT [facility]:   Refer to the procedure report that was given to you for any specific questions about what was found during the examination.  If the procedure report does not answer your questions, please call your gastroenterologist to clarify.  If you requested that your care partner not be given the details of your procedure findings, then the procedure report has been included in a sealed envelope for you to review at your convenience later.  YOU SHOULD EXPECT: Some feelings of bloating in the abdomen. Passage of more gas than usual.  Walking can help get rid of the air that was put into your GI tract during the procedure and reduce the bloating. If you had a lower endoscopy (such as a colonoscopy or flexible sigmoidoscopy) you may notice spotting of blood in your stool or on the toilet paper. If you underwent a bowel prep for your procedure, you may not have a normal bowel movement for a few days.  Please Note:  You might notice some irritation and congestion in your nose or some drainage.  This is from the oxygen used during your procedure.  There is no need for concern and it should clear up in a day or so.  SYMPTOMS TO REPORT IMMEDIATELY:   Following lower endoscopy (colonoscopy or flexible sigmoidoscopy):  Excessive amounts of blood in the stool  Significant tenderness or worsening of abdominal pains  Swelling of the abdomen that is new, acute  Fever of 100F or higher   Following upper endoscopy (EGD)  Vomiting of blood or coffee ground material  New chest pain or pain under the shoulder blades  Painful or persistently difficult swallowing  New shortness of breath  Fever of 100F or higher  Black, tarry-looking stools  For  urgent or emergent issues, a gastroenterologist can be reached at any hour by calling [phone].   DIET:  We do recommend a small meal at first, but then you may proceed to your regular diet.  Drink plenty of fluids but you should avoid alcoholic beverages for 24 hours.  ACTIVITY:  You should plan to take it easy for the rest of today and you should NOT DRIVE or use heavy machinery until tomorrow (because of the sedation medicines used during the test).    FOLLOW UP: Our staff will call the number listed on your records the next business day following your procedure to check on you and address any questions or concerns that you may have regarding the information given to you following your procedure. If we do not reach you, we will leave a message.  However, if you are feeling well and you are not experiencing any problems, there is no need to return our call.  We will assume that you have returned to your regular daily activities without incident.  If any biopsies were taken you will be contacted by phone or by letter within the next 1-3 weeks.  Please call us at [phone] if you have not heard about the biopsies in 3 weeks.    SIGNATURES/CONFIDENTIALITY: You and/or your care partner have signed paperwork which will be entered into your electronic medical record.  These signatures attest to the fact that that the information above on your After Visit Summary has been  reviewed and is understood.  Full responsibility of the confidentiality of this discharge information lies with you and/or your care-partner.

## 2018-04-17 NOTE — Progress Notes (Signed)
Pt's states no medical or surgical changes since previsit or office visit. 

## 2018-04-18 ENCOUNTER — Telehealth: Payer: Self-pay | Admitting: *Deleted

## 2018-04-18 ENCOUNTER — Telehealth: Payer: Self-pay

## 2018-04-18 NOTE — Telephone Encounter (Signed)
  Follow up Call-  Call back number 04/17/2018  Post procedure Call Back phone  # [phone]  Permission to leave phone message Yes  Some recent data might be hidden     Patient questions:  Do you have a fever, pain , or abdominal swelling? No. Pain Score  0 *  Have you tolerated food without any problems? Yes.    Have you been able to return to your normal activities? Yes.    Do you have any questions about your discharge instructions: Diet   No. Medications  No. Follow up visit  No.  Do you have questions or concerns about your Care? No.  Actions: * If pain score is 4 or above: No action needed, pain <4.

## 2018-04-18 NOTE — Telephone Encounter (Signed)
Left message on f/u call 

## 2018-04-19 ENCOUNTER — Encounter: Payer: Medicare Other | Admitting: Internal Medicine

## 2018-04-20 ENCOUNTER — Encounter: Payer: Self-pay | Admitting: Internal Medicine

## 2018-05-02 ENCOUNTER — Telehealth: Payer: Self-pay | Admitting: Family Medicine

## 2018-05-02 NOTE — Telephone Encounter (Signed)
Clinical info completed on *FMLA  form.  Place form in PCP's box for completion.  Sharrell Krawiec, CMA   

## 2018-05-02 NOTE — Telephone Encounter (Signed)
FMLA form dropped off at front desk for completion.  Verified that patient section of form has been completed.  Last DOS/WCC with PCP was 02/26/2018.  Placed form in team folder to be completed by clinical staff.  Eldred Manges [place]oto

## 2018-05-04 ENCOUNTER — Other Ambulatory Visit: Payer: Self-pay | Admitting: Family Medicine

## 2018-05-04 DIAGNOSIS — Z1239 Encounter for other screening for malignant neoplasm of breast: Secondary | ICD-10-CM

## 2018-05-04 NOTE — Telephone Encounter (Signed)
Forms completed, copy made to scan into chart (placed in batch scanning) and original forms placed in RN box for patient pick up.

## 2018-05-07 NOTE — Telephone Encounter (Signed)
Faxed to [employer] per request on form, copy made for batch scanning, original placed at front desk. Danley Danker, RN [facility] RN)

## 2018-07-05 ENCOUNTER — Other Ambulatory Visit: Payer: Self-pay

## 2018-07-05 ENCOUNTER — Telehealth (INDEPENDENT_AMBULATORY_CARE_PROVIDER_SITE_OTHER): Payer: Medicare Other | Admitting: Family Medicine

## 2018-07-05 ENCOUNTER — Telehealth: Payer: Medicare Other

## 2018-07-05 DIAGNOSIS — J0100 Acute maxillary sinusitis, unspecified: Secondary | ICD-10-CM | POA: Diagnosis not present

## 2018-07-05 MED ORDER — LORATADINE 10 MG PO TABS: 10.0000 mg | ORAL_TABLET | Freq: Every day | ORAL | 1 refills | Status: DC

## 2018-07-05 MED ORDER — AMOXICILLIN 500 MG PO CAPS: 500.0000 mg | ORAL_CAPSULE | Freq: Three times a day (TID) | ORAL | 0 refills | Status: AC

## 2018-07-05 MED ORDER — FLUTICASONE PROPIONATE 50 MCG/ACT NA SUSP: 2.0000 | Freq: Every day | NASAL | 6 refills | Status: DC

## 2018-07-05 NOTE — Progress Notes (Signed)
[facility] Telemedicine Visit  Patient consented to have virtual visit. Method of visit: Telephone  Encounter participants: Patient: Theresa [name] - located at home Provider: Lind Covert - located at office Others (if applicable): no  Chief Complaint: Sinusitis  HPI: Having face and tooth pain associated with purulent nasal discharge and headache.  Similar to prior infections.  No unusual shortness of breath using albuterol three times a day.  Ran out of flonase and loratadine but using nasal saline No fevers or rash or nausea and vomiting   ROS: per HPI  Pertinent PMHx: copd   Exam:  Respiratory: sounds nasally congested but conversational Psych:  Cognition and judgment appear intact. Alert, communicative  and cooperative with normal attention span and concentration. No apparent delusions, illusions, hallucinations   Assessment/Plan:  Sinusitis, acute Symptoms consistent with recurrent sinusitis perhaps with component of mild copd exacerbation.  Does not seem to have pneumonia or covid.  Took Amoxicillin in 2016 (in medication section without problems).  Asked to continue her albuterol and to call if any worsenng     Time spent during visit with patient: 18 minutes

## 2018-07-05 NOTE — Assessment & Plan Note (Signed)
Symptoms consistent with recurrent sinusitis perhaps with component of mild copd exacerbation.  Does not seem to have pneumonia or covid.  Took Amoxicillin in 2016 (in medication section without problems).  Asked to continue her albuterol and to call if any worsenng

## 2018-07-13 ENCOUNTER — Other Ambulatory Visit: Payer: Self-pay

## 2018-07-15 MED ORDER — MONTELUKAST SODIUM 10 MG PO TABS: 10.0000 mg | ORAL_TABLET | Freq: Every day | ORAL | 5 refills | Status: DC

## 2018-07-29 ENCOUNTER — Other Ambulatory Visit: Payer: Self-pay | Admitting: Family Medicine

## 2018-09-17 ENCOUNTER — Other Ambulatory Visit: Payer: Self-pay

## 2018-09-18 MED ORDER — GABAPENTIN 300 MG PO CAPS: ORAL_CAPSULE | ORAL | 1 refills | Status: DC

## 2018-10-16 ENCOUNTER — Other Ambulatory Visit: Payer: Self-pay | Admitting: *Deleted

## 2018-10-17 MED ORDER — OMEPRAZOLE 20 MG PO CPDR: 20.0000 mg | DELAYED_RELEASE_CAPSULE | ORAL | 2 refills | Status: DC

## 2018-11-12 ENCOUNTER — Other Ambulatory Visit: Payer: Self-pay

## 2018-11-13 ENCOUNTER — Other Ambulatory Visit: Payer: Self-pay | Admitting: Family Medicine

## 2018-11-13 MED ORDER — ALBUTEROL SULFATE HFA 108 (90 BASE) MCG/ACT IN AERS: 2.0000 | INHALATION_SPRAY | RESPIRATORY_TRACT | 1 refills | Status: DC | PRN

## 2018-11-13 NOTE — Telephone Encounter (Signed)
Refill provided. Please help patient make appointment to follow up on diabetes. Thanks!

## 2018-12-21 ENCOUNTER — Other Ambulatory Visit: Payer: Self-pay | Admitting: *Deleted

## 2018-12-21 DIAGNOSIS — I10 Essential (primary) hypertension: Secondary | ICD-10-CM

## 2018-12-21 DIAGNOSIS — E785 Hyperlipidemia, unspecified: Secondary | ICD-10-CM

## 2018-12-22 MED ORDER — CARVEDILOL 25 MG PO TABS: 25.0000 mg | ORAL_TABLET | Freq: Two times a day (BID) | ORAL | 1 refills | Status: DC

## 2018-12-22 NOTE — Telephone Encounter (Signed)
Refill provided, please help patient make appointment to f/u for diabetes and blood pressure. Thank you!!

## 2019-01-16 ENCOUNTER — Telehealth: Payer: Self-pay | Admitting: *Deleted

## 2019-01-16 NOTE — Telephone Encounter (Signed)
She needs an appointment to discuss further. She needs f/u for depression and diabetes as both have not been well controlled.

## 2019-01-16 NOTE — Telephone Encounter (Signed)
Received call from Daughter asking about refill of cymbalta.  This is not on her current med list.  Contacted [facility] pt is out of cymbalta but has not refilled it since June.  Will forward to PCP for next steps. Christen Bame, CMA

## 2019-01-17 ENCOUNTER — Other Ambulatory Visit: Payer: Self-pay | Admitting: Family Medicine

## 2019-01-17 MED ORDER — DULOXETINE HCL 60 MG PO CPEP: 60.0000 mg | ORAL_CAPSULE | Freq: Every day | ORAL | 0 refills | Status: DC

## 2019-01-17 NOTE — Telephone Encounter (Signed)
Spoke with patient and clarified she has been getting this. Refill sent.

## 2019-01-17 NOTE — Telephone Encounter (Signed)
Spoke with pt. She did confirm that she take cymbalta everyday. She also states that she gets this medication in a 90day supply. Salvatore Marvel, CMA

## 2019-01-17 NOTE — Telephone Encounter (Signed)
Called patient but had to LVM. Last time cymbalta was filled was 01/2018 for 90 day supply. Has she been taking this?

## 2019-01-17 NOTE — Telephone Encounter (Signed)
Daughter calls back to check status.  Appt made but first available but first available is not until 02/06/19.  She wonders if she can get a partial fill of Cymbalta until then.  To PCP.  Christen Bame, CMA

## 2019-02-06 ENCOUNTER — Other Ambulatory Visit: Payer: Self-pay

## 2019-02-06 ENCOUNTER — Encounter: Payer: Self-pay | Admitting: Family Medicine

## 2019-02-06 ENCOUNTER — Ambulatory Visit: Payer: Medicare Other | Admitting: Family Medicine

## 2019-02-06 VITALS — BP 160/90 | HR 82 | Temp 98.5°F | Wt 200.0 lb

## 2019-02-06 DIAGNOSIS — Z794 Long term (current) use of insulin: Secondary | ICD-10-CM | POA: Diagnosis not present

## 2019-02-06 DIAGNOSIS — J0101 Acute recurrent maxillary sinusitis: Secondary | ICD-10-CM | POA: Diagnosis not present

## 2019-02-06 DIAGNOSIS — E1165 Type 2 diabetes mellitus with hyperglycemia: Secondary | ICD-10-CM | POA: Diagnosis not present

## 2019-02-06 DIAGNOSIS — I10 Essential (primary) hypertension: Secondary | ICD-10-CM | POA: Diagnosis not present

## 2019-02-06 DIAGNOSIS — Z114 Encounter for screening for human immunodeficiency virus [HIV]: Secondary | ICD-10-CM | POA: Diagnosis not present

## 2019-02-06 DIAGNOSIS — J441 Chronic obstructive pulmonary disease with (acute) exacerbation: Secondary | ICD-10-CM

## 2019-02-06 DIAGNOSIS — F331 Major depressive disorder, recurrent, moderate: Secondary | ICD-10-CM

## 2019-02-06 LAB — POCT GLYCOSYLATED HEMOGLOBIN (HGB A1C): HbA1c, POC (controlled diabetic range): 11.5 % — AB (ref 0.0–7.0)

## 2019-02-06 LAB — POCT UA - MICROALBUMIN
Albumin/Creatinine Ratio, Urine, POC: >300
Creatinine, POC: 10 mg/dL
Microalbumin Ur, POC: 150 mg/L

## 2019-02-06 MED ORDER — AMOXICILLIN 875 MG PO TABS: 875.0000 mg | ORAL_TABLET | Freq: Two times a day (BID) | ORAL | 0 refills | Status: AC

## 2019-02-06 MED ORDER — LINACLOTIDE 290 MCG PO CAPS: 290.0000 ug | ORAL_CAPSULE | Freq: Every day | ORAL | 3 refills | Status: DC

## 2019-02-06 MED ORDER — ALBUTEROL SULFATE HFA 108 (90 BASE) MCG/ACT IN AERS: INHALATION_SPRAY | RESPIRATORY_TRACT | 3 refills | Status: DC

## 2019-02-06 MED ORDER — CLOBETASOL PROPIONATE 0.05 % EX CREA: 1.0000 "application " | TOPICAL_CREAM | Freq: Two times a day (BID) | CUTANEOUS | 1 refills | Status: DC

## 2019-02-06 MED ORDER — PEN NEEDLES 32G X 4 MM MISC: 1.0000 | Freq: Every day | 3 refills | Status: DC

## 2019-02-06 MED ORDER — MONTELUKAST SODIUM 10 MG PO TABS: 10.0000 mg | ORAL_TABLET | Freq: Every day | ORAL | 3 refills | Status: DC

## 2019-02-06 MED ORDER — METFORMIN HCL 1000 MG PO TABS: 1000.0000 mg | ORAL_TABLET | Freq: Two times a day (BID) | ORAL | 3 refills | Status: DC

## 2019-02-06 MED ORDER — SPIRIVA HANDIHALER 18 MCG IN CAPS: ORAL_CAPSULE | RESPIRATORY_TRACT | 3 refills | Status: DC

## 2019-02-06 MED ORDER — BUPROPION HCL ER (XL) 150 MG PO TB24: 150.0000 mg | ORAL_TABLET | Freq: Every day | ORAL | 1 refills | Status: DC

## 2019-02-06 MED ORDER — EMPAGLIFLOZIN 10 MG PO TABS: 10.0000 mg | ORAL_TABLET | Freq: Every day | ORAL | 3 refills | Status: DC

## 2019-02-06 MED ORDER — LISINOPRIL 5 MG PO TABS: 2.5000 mg | ORAL_TABLET | Freq: Every day | ORAL | 3 refills | Status: DC

## 2019-02-06 NOTE — Patient Instructions (Signed)
It was great to see you!  Our plans for today:  - I sent in an antibiotic for your sinusitis, let me know if you are still having issues by next week or if you develop fevers.  If you develop significant difficulties breathing that is not better with albuterol, you should go to the emergency room. -We refilled your medications. -Make sure to keep your appointment next month with the eye doctor. - Your A1c is 11.5 which is very uncontrolled.  We are starting any medication for your diabetes. -Come back in 1 month with a list of your blood sugars.  We are checking some labs today, we will call you or send you a letter if they are abnormal.   Take care and seek immediate care sooner if you develop any concerns.   Dr. [facility]

## 2019-02-06 NOTE — Progress Notes (Signed)
Subjective:   Patient ID: Theresa [name]    DOB: 12-Dec-1965, 53 y.o. female   MRN: [id number]  Theresa [name] is a 53 y.o. female with a history of HTN, migraine, h/o lung cancer, OSA, COPD, T2 DM, eczema, CKD 3, HLD, morbid obesity, depression, tobacco use, legal blindness here for   Depression - Medications: Cymbalta 60 mg - Taking: as prescribed - Current stressors: COVID, loss of mother last year - Coping Mechanisms: spends time with granddaughter - not currently with counseling.  Diabetes, Type 2 - Last A1c 6.6 02/2018 - Medications: Lantus 20 units daily, metformin 1000 mg twice daily - Compliance: good - Checking BG at home: yes, 200s - Diet: worse control since mom passed - Eye exam: Due, has appt next month - Foot exam: Due - Microalbumin: due - Statin: Yes - Denies symptoms of hypoglycemia, polyuria, polydipsia, numbness extremities, foot ulcers/trauma  Hypertension: - Medications: amlodipine 10mg , lisinopril 2.5mg  - Compliance: ran out of lisinopril - Checking BP at home: no - Denies any CP, vision changes, LE edema, medication SEs, or symptoms of hypotension  Sinusitis - has h/o allergies and chronic sinusitis, getting worse recently. Reports increase in nasal congestion and mucus with associated L sided facial pain - Previously on amoxicillin 06/2018 for sinusitis, had relief. - Denies fevers, sick contacts - has slight difficulties breathing with fatigue, using albuterol more recently. - chronic cough with increase in productive cough. Using spiriva daily.  Review of Systems:  Per HPI.  Medications and smoking status reviewed.  Objective:   BP (!) 160/90   Pulse 82   Temp 98.5 F (36.9 C) (Oral)   Wt 200 lb (90.7 kg)   SpO2 96%   BMI 36.00 kg/m  Vitals and nursing note reviewed.  General: obese female, in no acute distress with non-toxic appearance HEENT: normocephalic, atraumatic, moist mucous membranes. Tenderness to palpation over maxillary and  frontal sinuses. CV: regular rate and rhythm without murmurs, rubs, or gallops Lungs: end expiratory wheezing throughout, normal work of breathing on room air. Coughing with deep breaths. Psych: well groomed, no flight of ideas. Speech normal with congruent thought process.  Depression screen [facility] 2/9 02/06/2019 02/06/2019 10/31/2017  Decreased Interest 2 2 3   Down, Depressed, Hopeless 2 2 3   PHQ - 2 Score 4 4 6   Altered sleeping 3 - 3  Tired, decreased energy 3 - 3  Change in appetite 3 - 3  Feeling bad or failure about yourself  1 - 3  Trouble concentrating 2 - 3  Moving slowly or fidgety/restless - - 1  Suicidal thoughts 0 - 0  PHQ-9 Score 16 - 22  Difficult doing work/chores - - -  Some recent data might be hidden     Assessment & Plan:   Essential hypertension Above goal but has been without lisinopril. Will refill meds and f/u in one month.  Sinusitis, acute Acute on chronic with mild COPD exacerbation. No fevers or respiratory distress. No sick contacts, low suspicion for COVID. No findings on exam to suggest pneumonia. Continue with prn albuterol. Rx provided for amoxicillin. Instructed to RTC or present to ED for worsened shortness of breath or chest pain.  Diabetes mellitus type 2, uncontrolled Uncontrolled, A1c 11.5 today. Urine microalbumin wnl today. Will add Jardiance, f/u in one month with [facility]. Will check BMP in 2 weeks.   Major depressive disorder, recurrent episode Continues to tolerate cymbalta but with continued symptoms, PHQ9 16 today. Recommended establishing with counseling. Has good coping  mechanisms. Will add buproprion for better control with added attempt at helping with tobacco cessation. Follow up in one month.  Orders Placed This Encounter  Procedures  . HIV antibody (with reflex)  . Lipid Panel  . Basic Metabolic Panel  . POC Hemoglobin A1c (dx code Z13.1)    Associate with Z13.1  . POCT UA - Microalbumin   Meds ordered this encounter   Medications  . Insulin Pen Needle (PEN NEEDLES) 32G X 4 MM MISC    Sig: 1 Stick by Does not apply route daily.    Dispense:  100 each    Refill:  3  . clobetasol cream (TEMOVATE) 0.05 %    Sig: Apply 1 application topically 2 (two) times daily.    Dispense:  60 g    Refill:  1  . linaclotide (LINZESS) 290 MCG CAPS capsule    Sig: Take 1 capsule (290 mcg total) by mouth daily.    Dispense:  90 capsule    Refill:  3  . DISCONTD: albuterol (PROAIR HFA) 108 (90 Base) MCG/ACT inhaler    Sig: Please specify directions, refills and quantity    Dispense:  8 g    Refill:  3  . montelukast (SINGULAIR) 10 MG tablet    Sig: Take 1 tablet (10 mg total) by mouth at bedtime.    Dispense:  90 tablet    Refill:  3  . SPIRIVA HANDIHALER 18 MCG inhalation capsule    Sig: INHALE 1 CAPSULE VIA HANDIHALER ONCE DAILY AT THE SAME TIME EVERY DAY    Dispense:  90 capsule    Refill:  3  . metFORMIN (GLUCOPHAGE) 1000 MG tablet    Sig: Take 1 tablet (1,000 mg total) by mouth 2 (two) times daily with a meal.    Dispense:  180 tablet    Refill:  3  . lisinopril (ZESTRIL) 5 MG tablet    Sig: Take 0.5 tablets (2.5 mg total) by mouth daily.    Dispense:  45 tablet    Refill:  3  . amoxicillin (AMOXIL) 875 MG tablet    Sig: Take 1 tablet (875 mg total) by mouth 2 (two) times daily for 5 days.    Dispense:  10 tablet    Refill:  0  . empagliflozin (JARDIANCE) 10 MG TABS tablet    Sig: Take 10 mg by mouth daily.    Dispense:  90 tablet    Refill:  3  . buPROPion (WELLBUTRIN XL) 150 MG 24 hr tablet    Sig: Take 1 tablet (150 mg total) by mouth daily. On day 4, increase to 2 tablets (300 mg) daily.    Dispense:  90 tablet    Refill:  1  . albuterol (PROAIR HFA) 108 (90 Base) MCG/ACT inhaler    Sig: Inhale 2 puffs into the lungs every 4 (four) hours as needed for wheezing or shortness of breath.    Dispense:  8 g    Refill:  [place], DO PGY-3, [facility] 02/07/2019 9:57 AM

## 2019-02-07 LAB — BASIC METABOLIC PANEL
BUN/Creatinine Ratio: 6 — ABNORMAL LOW (ref 9–23)
BUN: 8 mg/dL (ref 6–24)
CO2: 22 mmol/L (ref 20–29)
Calcium: 9.7 mg/dL (ref 8.7–10.2)
Chloride: 97 mmol/L (ref 96–106)
Creatinine, Ser: 1.28 mg/dL — ABNORMAL HIGH (ref 0.57–1.00)
GFR calc Af Amer: 54 mL/min/{1.73_m2} — ABNORMAL LOW (ref >59)
GFR calc non Af Amer: 47 mL/min/{1.73_m2} — ABNORMAL LOW (ref >59)
Glucose: 347 mg/dL — ABNORMAL HIGH (ref 65–99)
Potassium: 3.9 mmol/L (ref 3.5–5.2)
Sodium: 136 mmol/L (ref 134–144)

## 2019-02-07 LAB — LIPID PANEL
Chol/HDL Ratio: 3.8 ratio (ref 0.0–4.4)
Cholesterol, Total: 153 mg/dL (ref 100–199)
HDL: 40 mg/dL (ref >39)
LDL Chol Calc (NIH): 93 mg/dL (ref 0–99)
Triglycerides: 109 mg/dL (ref 0–149)
VLDL Cholesterol Cal: 20 mg/dL (ref 5–40)

## 2019-02-07 LAB — HIV ANTIBODY (ROUTINE TESTING W REFLEX): HIV Screen 4th Generation wRfx: NONREACTIVE

## 2019-02-07 MED ORDER — ATORVASTATIN CALCIUM 80 MG PO TABS: 80.0000 mg | ORAL_TABLET | Freq: Every day | ORAL | 3 refills | Status: DC

## 2019-02-07 MED ORDER — ALBUTEROL SULFATE HFA 108 (90 BASE) MCG/ACT IN AERS: 2.0000 | INHALATION_SPRAY | RESPIRATORY_TRACT | 3 refills | Status: DC | PRN

## 2019-02-07 NOTE — Assessment & Plan Note (Signed)
Above goal but has been without lisinopril. Will refill meds and f/u in one month.

## 2019-02-07 NOTE — Assessment & Plan Note (Signed)
Continues to tolerate cymbalta but with continued symptoms, PHQ9 16 today. Recommended establishing with counseling. Has good coping mechanisms. Will add buproprion for better control with added attempt at helping with tobacco cessation. Follow up in one month.

## 2019-02-07 NOTE — Addendum Note (Signed)
Addended by: Myles Gip on: 02/07/2019 10:32 AM   Modules accepted: Orders

## 2019-02-07 NOTE — Addendum Note (Signed)
Addended by: Myles Gip on: 02/07/2019 10:27 AM   Modules accepted: Orders

## 2019-02-07 NOTE — Assessment & Plan Note (Signed)
Acute on chronic with mild COPD exacerbation. No fevers or respiratory distress. No sick contacts, low suspicion for COVID. No findings on exam to suggest pneumonia. Continue with prn albuterol. Rx provided for amoxicillin. Instructed to RTC or present to ED for worsened shortness of breath or chest pain.

## 2019-02-07 NOTE — Assessment & Plan Note (Addendum)
Uncontrolled, A1c 11.5 today. Urine microalbumin wnl today. Will add Jardiance, f/u in one month with [facility]. Will check BMP in 2 weeks.

## 2019-03-04 ENCOUNTER — Other Ambulatory Visit: Payer: Self-pay

## 2019-03-04 ENCOUNTER — Ambulatory Visit (INDEPENDENT_AMBULATORY_CARE_PROVIDER_SITE_OTHER): Payer: Medicare Other | Admitting: Family Medicine

## 2019-03-04 ENCOUNTER — Encounter: Payer: Self-pay | Admitting: Family Medicine

## 2019-03-04 VITALS — BP 106/64 | HR 74 | Ht 63.0 in | Wt 211.0 lb

## 2019-03-04 DIAGNOSIS — E11649 Type 2 diabetes mellitus with hypoglycemia without coma: Secondary | ICD-10-CM

## 2019-03-04 DIAGNOSIS — I10 Essential (primary) hypertension: Secondary | ICD-10-CM

## 2019-03-04 DIAGNOSIS — E7800 Pure hypercholesterolemia, unspecified: Secondary | ICD-10-CM | POA: Diagnosis not present

## 2019-03-04 LAB — POCT UA - MICROALBUMIN
Creatinine, POC: 300 mg/dL
Microalbumin Ur, POC: 150 mg/L

## 2019-03-04 MED ORDER — EZETIMIBE 10 MG PO TABS: 10.0000 mg | ORAL_TABLET | Freq: Every day | ORAL | 3 refills | Status: DC

## 2019-03-04 NOTE — Progress Notes (Signed)
Subjective:   Patient ID: Theresa [name]    DOB: 1965/05/31, 53 y.o. female   MRN: [id number]  Theresa [name] is a 53 y.o. female with a history of HTN, migraine, H/o lung cancer, OSA, COPD, T2DM, eczema, morbid obesity, MDD, tobacco use, glaucoma here for   Diabetes, Type 2 - Last A1c 11.5 02/06/19 - Medications: Jardiance 10 mg daily, Lantus 20 units daily, Metformin 1000 mg twice daily - Compliance: good - Checking BG at home: yes, 120-140s.  - Diet: eating 2 meals per day, 1 snack per day. Eats fruits and vegetables. - Exercise: playing with puppy, parking farther from door and walking. - Eye exam: due - Foot exam: due - Statin: yes  - Denies symptoms of hypoglycemia, numbness extremities, foot ulcers/trauma -Endorses some chronic numbness and tingling of extremities, occasional nausea  Hypertension: - Medications: Amlodipine 10 mg, Coreg 25 mg twice daily, lisinopril 5 mg daily - Compliance: good - Checking BP at home: no - Denies any vision changes, LE edema, medication SEs, or symptoms of hypotension - Diet: see above - Exercise: see above  Review of Systems:  Per HPI.  Medications and smoking status reviewed.  Objective:   BP 106/64   Pulse 74   Ht 5\' 3"  (1.6 m)   Wt 211 lb (95.7 kg)   SpO2 98%   BMI 37.38 kg/m  Vitals and nursing note reviewed.  General: Morbidly obese female, in no acute distress with non-toxic appearance CV: regular rate and rhythm without murmurs, rubs, or gallops, no lower extremity edema Lungs: clear to auscultation bilaterally with normal work of breathing Skin: warm, dry, no rashes or lesions Extremities: warm and well perfused, normal tone.  Foot exam within normal limits.  Assessment & Plan:   Essential hypertension Low normal today but without symptoms concerning for hypotension.  Will continue current regimen for now.  Will update BMP today.  Follow-up in 2 months.  Diabetes mellitus type 2, uncontrolled CBGs under better  control since starting Jardiance.  Expect next A1c in 2 months to reflect better control.  Still with not much physical activity with 11 pound weight increase in 1 month.  Discussed diet changes and referred to nutrition, patient amenable.  Foot exam and urine microalbumin within normal limits today.  Referred to Optho for diabetic eye exam.  Follow-up in 2 months for next A1c, no medication changes made today.  Hypercholesterolemia LDL above goal at last check last month.  Currently on high intensity statin.  Will initiate Zetia for better control and recheck lipid panel at next visit.  Morbid obesity Contributing to diabetes and hypertension with 11 pound weight gain since last visit.  Recommended weight loss through diet and exercise.  Referral made to nutritionist.  Orders Placed This Encounter  Procedures  . Ambulatory referral to Ophthalmology    Referral Priority:   Routine    Referral Type:   Consultation    Referral Reason:   Specialty Services Required    Requested Specialty:   Ophthalmology    Number of Visits Requested:   1  . Referral to Nutritionist    Referral Priority:   Routine    Referral Type:   Consultation    Referral Reason:   Specialty Services Required    Requested Specialty:   Nutrition  . POCT UA - Microalbumin   Meds ordered this encounter  Medications  . ezetimibe (ZETIA) 10 MG tablet    Sig: Take 1 tablet (10 mg total) by mouth  daily.    Dispense:  90 tablet    Refill:  [place], DO PGY-3, [facility] 03/04/2019 11:32 AM

## 2019-03-04 NOTE — Assessment & Plan Note (Signed)
LDL above goal at last check last month.  Currently on high intensity statin.  Will initiate Zetia for better control and recheck lipid panel at next visit.

## 2019-03-04 NOTE — Patient Instructions (Signed)
It was great to see you!  Our plans for today:  - No changes to your medications today. - Please call our nutritionist, Dr. Jenne Campus, to set up an appointment. [phone]. - We are starting a new medication to help lower your cholesterol.  Take Zetia every day with your atorvastatin.  We will recheck your cholesterol in 4-6 weeks. - Come back in 2 months for diabetes follow-up and A1c check.  Take care and seek immediate care sooner if you develop any concerns.   Dr. [facility]

## 2019-03-04 NOTE — Assessment & Plan Note (Addendum)
CBGs under better control since starting Jardiance.  Expect next A1c in 2 months to reflect better control.  Still with not much physical activity with 11 pound weight increase in 1 month.  Discussed diet changes and referred to nutrition, patient amenable.  Foot exam and urine microalbumin within normal limits today.  Referred to Optho for diabetic eye exam.  Follow-up in 2 months for next A1c, no medication changes made today.

## 2019-03-04 NOTE — Assessment & Plan Note (Addendum)
Contributing to diabetes and hypertension with 11 pound weight gain since last visit.  Recommended weight loss through diet and exercise.  Referral made to nutritionist.

## 2019-03-04 NOTE — Assessment & Plan Note (Signed)
Low normal today but without symptoms concerning for hypotension.  Will continue current regimen for now.  Will update BMP today.  Follow-up in 2 months.

## 2019-03-05 ENCOUNTER — Other Ambulatory Visit: Payer: Self-pay | Admitting: *Deleted

## 2019-03-05 MED ORDER — LORATADINE 10 MG PO TABS: 10.0000 mg | ORAL_TABLET | Freq: Every day | ORAL | 1 refills | Status: DC

## 2019-03-16 ENCOUNTER — Other Ambulatory Visit: Payer: Self-pay | Admitting: Family Medicine

## 2019-03-18 ENCOUNTER — Other Ambulatory Visit: Payer: Self-pay | Admitting: Neurological Surgery

## 2019-03-18 DIAGNOSIS — M545 Low back pain, unspecified: Secondary | ICD-10-CM

## 2019-03-19 ENCOUNTER — Other Ambulatory Visit: Payer: Self-pay | Admitting: Family Medicine

## 2019-03-26 ENCOUNTER — Ambulatory Visit
Admission: RE | Admit: 2019-03-26 | Discharge: 2019-03-26 | Disposition: A | Discharge status: Home / Self Care | Payer: Medicare Other | Source: Ambulatory Visit | Attending: Neurological Surgery | Admitting: Neurological Surgery

## 2019-03-26 ENCOUNTER — Other Ambulatory Visit: Payer: Medicare Other

## 2019-03-26 DIAGNOSIS — M545 Low back pain, unspecified: Secondary | ICD-10-CM

## 2019-04-01 ENCOUNTER — Other Ambulatory Visit: Payer: Medicare Other

## 2019-04-02 ENCOUNTER — Other Ambulatory Visit: Payer: Self-pay

## 2019-04-02 ENCOUNTER — Other Ambulatory Visit: Payer: Medicare Other

## 2019-04-02 DIAGNOSIS — E1165 Type 2 diabetes mellitus with hyperglycemia: Secondary | ICD-10-CM

## 2019-04-03 LAB — BASIC METABOLIC PANEL
BUN/Creatinine Ratio: 13 (ref 9–23)
BUN: 16 mg/dL (ref 6–24)
CO2: 21 mmol/L (ref 20–29)
Calcium: 9.6 mg/dL (ref 8.7–10.2)
Chloride: 97 mmol/L (ref 96–106)
Creatinine, Ser: 1.2 mg/dL — ABNORMAL HIGH (ref 0.57–1.00)
GFR calc Af Amer: 59 mL/min/{1.73_m2} — ABNORMAL LOW (ref >59)
GFR calc non Af Amer: 51 mL/min/{1.73_m2} — ABNORMAL LOW (ref >59)
Glucose: 346 mg/dL — ABNORMAL HIGH (ref 65–99)
Potassium: 3.9 mmol/L (ref 3.5–5.2)
Sodium: 136 mmol/L (ref 134–144)

## 2019-04-06 ENCOUNTER — Other Ambulatory Visit: Payer: Self-pay | Admitting: Family Medicine

## 2019-04-12 ENCOUNTER — Other Ambulatory Visit: Payer: Self-pay | Admitting: Family Medicine

## 2019-04-18 ENCOUNTER — Other Ambulatory Visit: Payer: Self-pay | Admitting: Family Medicine

## 2019-04-18 DIAGNOSIS — I10 Essential (primary) hypertension: Secondary | ICD-10-CM

## 2019-04-18 DIAGNOSIS — E785 Hyperlipidemia, unspecified: Secondary | ICD-10-CM

## 2019-05-06 ENCOUNTER — Other Ambulatory Visit: Payer: Self-pay | Admitting: *Deleted

## 2019-05-06 MED ORDER — AMLODIPINE BESYLATE 10 MG PO TABS: 10.0000 mg | ORAL_TABLET | Freq: Every day | ORAL | 3 refills | Status: DC

## 2019-05-24 ENCOUNTER — Other Ambulatory Visit: Payer: Self-pay | Admitting: Family Medicine

## 2019-06-04 ENCOUNTER — Telehealth: Payer: Self-pay | Admitting: Family Medicine

## 2019-06-04 NOTE — Telephone Encounter (Signed)
Opened in error

## 2019-06-12 ENCOUNTER — Telehealth (INDEPENDENT_AMBULATORY_CARE_PROVIDER_SITE_OTHER): Payer: Medicare Other | Admitting: Family Medicine

## 2019-06-12 ENCOUNTER — Encounter: Payer: Self-pay | Admitting: Family Medicine

## 2019-06-12 ENCOUNTER — Other Ambulatory Visit: Payer: Self-pay

## 2019-06-12 DIAGNOSIS — Z716 Tobacco abuse counseling: Secondary | ICD-10-CM | POA: Diagnosis not present

## 2019-06-12 DIAGNOSIS — J441 Chronic obstructive pulmonary disease with (acute) exacerbation: Secondary | ICD-10-CM | POA: Diagnosis not present

## 2019-06-12 DIAGNOSIS — F17210 Nicotine dependence, cigarettes, uncomplicated: Secondary | ICD-10-CM | POA: Diagnosis not present

## 2019-06-12 MED ORDER — ALBUTEROL SULFATE HFA 108 (90 BASE) MCG/ACT IN AERS: 2.0000 | INHALATION_SPRAY | RESPIRATORY_TRACT | 3 refills | Status: DC | PRN

## 2019-06-12 MED ORDER — DOXYCYCLINE HYCLATE 100 MG PO TABS: 100.0000 mg | ORAL_TABLET | Freq: Two times a day (BID) | ORAL | 0 refills | Status: AC

## 2019-06-12 MED ORDER — TIOTROPIUM BROMIDE MONOHYDRATE 18 MCG IN CAPS: 18.0000 ug | ORAL_CAPSULE | Freq: Every day | RESPIRATORY_TRACT | 3 refills | Status: DC

## 2019-06-12 MED ORDER — PREDNISONE 20 MG PO TABS: 40.0000 mg | ORAL_TABLET | Freq: Every day | ORAL | 0 refills | Status: AC

## 2019-06-12 NOTE — Progress Notes (Signed)
[facility] Telemedicine Visit  Patient consented to have virtual visit. Method of visit: Telephone  Encounter participants: Patient: Theresa [name] - located at home Provider: Rory Percy - located at [facility] Others (if applicable): n/a  Chief Complaint: coughing, ribs hurting  HPI:  Patient reports progressively worsening productive cough for the past 3 weeks.  Cough is productive of yellow phlegm.  She has a history of COPD and has been using her albuterol inhaler about 3 times per day with some relief.  She has also tried montelukast, Mucinex, Robitussin.  She has been unable to afford her Spiriva so she has not taken this.  She is still smoking about 3-4 cigarettes/day.  Also endorsing associated runny nose, wheezing, reflux symptoms, and occasional shortness of breath.  She denies hemoptysis or weight loss.  She denies any known sick contacts or Covid contacts.  She does experience some chest and side pain but only with coughing.  She does endorse left leg swelling for the past year but denies any warmth, redness, pain to this area.   ROS: per HPI  Pertinent PMHx: HTN, COPD, OSA, H/o lung cancer, T2DM, cervical radiculopathy, eczema, CKD 3, HLD, morbid obesity, MDD, tobacco use, legal blindness, chronic sinusitis  Exam:  Respiratory: frequent coughing, able to speak in full sentences  Assessment/Plan:  COPD exacerbation ([place]) Acute with history of recurrent COPD exacerbations.  Without respiratory distress at rest, will recommend continued albuterol use.  We will also treat with steroid burst and doxycycline given increase in sputum production.  Again counseled on smoking cessation.  Provided generic Spiriva refill given this is on Medicare formulary, instructed to call clinic if unable to afford.  Doubt leg swelling due to acute DVT given duration and lack of additional symptoms, recommended patient present for in person appointment once current exacerbation  has improved to more fully evaluate.  Return and emergency precautions reviewed.   Time spent during visit with patient: 17 minutes

## 2019-06-13 NOTE — Assessment & Plan Note (Signed)
Acute with history of recurrent COPD exacerbations.  Without respiratory distress at rest, will recommend continued albuterol use.  We will also treat with steroid burst and doxycycline given increase in sputum production.  Again counseled on smoking cessation.  Provided generic Spiriva refill given this is on Medicare formulary, instructed to call clinic if unable to afford.  Doubt leg swelling due to acute DVT given duration and lack of additional symptoms, recommended patient present for in person appointment once current exacerbation has improved to more fully evaluate.  Return and emergency precautions reviewed.

## 2019-07-11 ENCOUNTER — Other Ambulatory Visit: Payer: Self-pay | Admitting: Family Medicine

## 2019-07-11 NOTE — Telephone Encounter (Signed)
LM for patient to call back and schedule an appointment.  Jonette Wassel,CMA

## 2019-07-15 ENCOUNTER — Other Ambulatory Visit: Payer: Self-pay | Admitting: Family Medicine

## 2019-08-02 ENCOUNTER — Encounter: Payer: Self-pay | Admitting: Family Medicine

## 2019-08-02 ENCOUNTER — Other Ambulatory Visit: Payer: Self-pay

## 2019-08-02 ENCOUNTER — Telehealth (INDEPENDENT_AMBULATORY_CARE_PROVIDER_SITE_OTHER): Payer: Medicare Other | Admitting: Family Medicine

## 2019-08-02 DIAGNOSIS — R059 Cough, unspecified: Secondary | ICD-10-CM

## 2019-08-02 DIAGNOSIS — T7840XS Allergy, unspecified, sequela: Secondary | ICD-10-CM | POA: Diagnosis not present

## 2019-08-02 DIAGNOSIS — J449 Chronic obstructive pulmonary disease, unspecified: Secondary | ICD-10-CM

## 2019-08-02 MED ORDER — MONTELUKAST SODIUM 10 MG PO TABS: 10.0000 mg | ORAL_TABLET | Freq: Every day | ORAL | 3 refills | Status: DC

## 2019-08-02 MED ORDER — ANORO ELLIPTA 62.5-25 MCG/INH IN AEPB: 1.0000 | INHALATION_SPRAY | Freq: Every day | RESPIRATORY_TRACT | 0 refills | Status: DC

## 2019-08-02 NOTE — Assessment & Plan Note (Addendum)
Progressive and worsening, suspect uncontrolled COPD is the large contributor of her symptoms. GOLD stage C. Given the chronicity of her symptoms and recent exacerbation treatment, suspect treating with steroids and antibiotics at this time would be futile.  As she has had difficulty affording a controller inhaler, will leave sample of Anoro at the front for her to pick up and placed a [facility] pharmacy referral to help with medication assistance, [facility] preferred.  Additionally, will order CT chest to screen for recurrent lung lesion/nodules.  Refilled montelukast and reinforced appropriate reflux management to help mitigate alternative causes of cough.  Highly encouraged tobacco cessation.

## 2019-08-02 NOTE — Progress Notes (Signed)
[facility] Telemedicine Visit  Patient consented to have virtual visit and was identified by name and date of birth. Method of visit: Telephone  Encounter participants: Patient: Theresa [name] - located at  Home  Provider: Patriciaann Clan - located at [facility]  Others (if applicable): None   Chief Complaint: Cough  HPI: Ms Theresa [name] is a 54 yo female with COPD and history of bronchogenic cancer presenting to discuss the following:   Cough: "I've had this for a long time." For the last several months and nothing is helping per her report, getting worse. Associated with some SOB. "Full of phlegm," can't get it up often but sometimes will have productive mucus. Using the mucinex and her inhalers/neb (albuterol only) 3-4 times/day, nyquil, OTC analgesics. Saw her PCP Dr. Ky Barban, in 05/2019 for COPD exacerbation and given steroid course and doxycycline. Prescribed Spiriva in 05/2019 as on medicare formulary, but she states it would still be $1000? She also has allergies and GERD, takes PPI and would like refill on montelukast.   Still smoking, saying its now really hard to smoke because she can't breathe well. Feeling tired, limiting her ability to do things around the house. She is on disability. 36 pack year history, reports smoking ~5 cigarettes daily.  History of bronchiogenic carcinoma s/p chemoradiation back in 2009-2012, last seen by oncology in 2017 when she was released for general surveillance through her PCP.  Has not had any advanced imaging of her chest since 2017.  Denies requiring hospitalizations for previous exacerbations.   ROS: per HPI  Pertinent PMHx: COPD, bronchogenic cancer of left lung recurrent in 2012, T2DM, chronic sinusitis, tobacco use, obesity  Exam:  There were no vitals taken for this visit.  Respiratory: Able to speak in full sentences, unlabored breathing. Frequent dry coughing after every few sentences.   Assessment/Plan:  COPD (chronic  obstructive pulmonary disease) ([facility]) Progressive and worsening, suspect uncontrolled COPD is the large contributor of her symptoms. GOLD stage C. Given the chronicity of her symptoms and recent exacerbation treatment, suspect treating with steroids and antibiotics at this time would be futile.  As she has had difficulty affording a controller inhaler, will leave sample of Anoro at the front for her to pick up and placed a [facility] pharmacy referral to help with medication assistance, LAMA preferred.  Additionally, will order CT chest to screen for recurrent lung lesion/nodules.  Refilled montelukast and reinforced appropriate reflux management to help mitigate alternative causes of cough.  Highly encouraged tobacco cessation.   Scheduled an office follow-up with her PCP Dr. Ky Barban on 6/2, patient to follow-up if worsening prior to.  Time spent during visit with patient: 25 minutes  Patriciaann Clan, DO  Family Medicine PGY-2

## 2019-08-08 ENCOUNTER — Other Ambulatory Visit: Payer: Self-pay

## 2019-08-08 DIAGNOSIS — J449 Chronic obstructive pulmonary disease, unspecified: Secondary | ICD-10-CM

## 2019-08-08 MED ORDER — ANORO ELLIPTA 62.5-25 MCG/INH IN AEPB: 1.0000 | INHALATION_SPRAY | Freq: Every day | RESPIRATORY_TRACT | 0 refills | Status: DC

## 2019-08-14 ENCOUNTER — Ambulatory Visit (HOSPITAL_COMMUNITY)
Admission: RE | Admit: 2019-08-14 | Discharge: 2019-08-14 | Disposition: A | Discharge status: Home / Self Care | Payer: Medicare Other | Source: Ambulatory Visit | Attending: Family Medicine | Admitting: Family Medicine

## 2019-08-14 ENCOUNTER — Ambulatory Visit (INDEPENDENT_AMBULATORY_CARE_PROVIDER_SITE_OTHER): Payer: Medicare Other | Admitting: Family Medicine

## 2019-08-14 ENCOUNTER — Other Ambulatory Visit: Payer: Self-pay

## 2019-08-14 ENCOUNTER — Encounter: Payer: Self-pay | Admitting: Family Medicine

## 2019-08-14 VITALS — BP 130/72 | HR 80 | Ht 63.0 in | Wt 210.4 lb

## 2019-08-14 DIAGNOSIS — R05 Cough: Secondary | ICD-10-CM | POA: Diagnosis not present

## 2019-08-14 DIAGNOSIS — T7840XS Allergy, unspecified, sequela: Secondary | ICD-10-CM | POA: Diagnosis not present

## 2019-08-14 DIAGNOSIS — F17200 Nicotine dependence, unspecified, uncomplicated: Secondary | ICD-10-CM | POA: Diagnosis not present

## 2019-08-14 DIAGNOSIS — J449 Chronic obstructive pulmonary disease, unspecified: Secondary | ICD-10-CM | POA: Diagnosis not present

## 2019-08-14 DIAGNOSIS — R059 Cough, unspecified: Secondary | ICD-10-CM

## 2019-08-14 MED ORDER — MONTELUKAST SODIUM 10 MG PO TABS: 10.0000 mg | ORAL_TABLET | Freq: Every day | ORAL | 3 refills | Status: AC

## 2019-08-14 MED ORDER — NICOTINE 14 MG/24HR TD PT24: 14.0000 mg | MEDICATED_PATCH | Freq: Every day | TRANSDERMAL | 0 refills | Status: DC

## 2019-08-14 NOTE — Patient Instructions (Signed)
It was great to see you!  Our plans for today:  - Take Anoro and montelukast daily. I sent a refill of montelukast to your pharmacy. - See the attached for the free and confidential tobacco quit line. - Pick up the patches from the pharmacy. You can also get patches through the quit line. - Come back in 3 weeks for follow up.   Take care and seek immediate care sooner if you develop any concerns.   Dr. [facility]

## 2019-08-14 NOTE — Progress Notes (Signed)
   SUBJECTIVE:   CHIEF COMPLAINT / HPI:   Here for COPD f/u. Has been using Anoro for about a week. Compliant with montelukast, needs refill. Reports breathing and cough is better though persistent with some mucus production. Hasn't had to use albuterol since starting anoro. At baseline, doesn't feel like she can keep up with other adults her age. She has appt for CT chest this afternoon given h/o lung cancer. She is still smoking. Previously tried chantix but felt aggressive on it and had bad nightmares. She wants to try patches.   PERTINENT  PMH / PSH: HTN, H/o lung cancer, OSA, COPD, diabetes type 2, chronic eczema, CKD 3, hirsutism, HLD, morbid obesity, depression, tobacco use, legal blindness  OBJECTIVE:   BP 130/72   Pulse 80   Ht 5\' 3"  (1.6 m)   Wt 210 lb 6 oz (95.4 kg)   SpO2 98%   BMI 37.27 kg/m   Gen: obese, in NAD Card: RRR, no murmur, no LE swelling Resp: normal WOB on RA with occasional cough, CTAB with very minimal end-expiratory wheeze, no rales. Appropriately saturated on RA.   ASSESSMENT/PLAN:   Tobacco use disorder Cessation counseling provided today. Provided 1-800-QUIT line and Rx for nicotine patches. F/u in 2-3 weeks.   COPD (chronic obstructive pulmonary disease) ([place]) Improved with only one week use of Anoro. Continue use and f/u in 2-3 weeks. Will f/u results of CT chest.    Rory Percy, [place]

## 2019-08-15 NOTE — Assessment & Plan Note (Signed)
Improved with only one week use of Anoro. Continue use and f/u in 2-3 weeks. Will f/u results of CT chest.

## 2019-08-15 NOTE — Assessment & Plan Note (Signed)
Cessation counseling provided today. Provided 1-800-QUIT line and Rx for nicotine patches. F/u in 2-3 weeks.

## 2019-08-19 ENCOUNTER — Encounter: Payer: Self-pay | Admitting: *Deleted

## 2019-08-19 ENCOUNTER — Other Ambulatory Visit: Payer: Self-pay | Admitting: Family Medicine

## 2019-08-19 DIAGNOSIS — R911 Solitary pulmonary nodule: Secondary | ICD-10-CM

## 2019-08-19 DIAGNOSIS — C3492 Malignant neoplasm of unspecified part of left bronchus or lung: Secondary | ICD-10-CM

## 2019-08-19 NOTE — Progress Notes (Signed)
I received referral on Theresa [name] today.  I updated scheduling to call and schedule to be seen.

## 2019-08-20 ENCOUNTER — Telehealth: Payer: Self-pay | Admitting: Internal Medicine

## 2019-08-20 NOTE — Telephone Encounter (Signed)
Scheduled appt per 6/7 sch message - pt is aware of appt date and time

## 2019-08-28 ENCOUNTER — Ambulatory Visit: Payer: Medicare Other | Admitting: Family Medicine

## 2019-09-02 ENCOUNTER — Other Ambulatory Visit: Payer: Self-pay | Admitting: Physician Assistant

## 2019-09-02 DIAGNOSIS — C3492 Malignant neoplasm of unspecified part of left bronchus or lung: Secondary | ICD-10-CM

## 2019-09-03 ENCOUNTER — Other Ambulatory Visit: Payer: Self-pay

## 2019-09-03 ENCOUNTER — Encounter: Payer: Self-pay | Admitting: Internal Medicine

## 2019-09-03 ENCOUNTER — Inpatient Hospital Stay: Payer: Medicare Other

## 2019-09-03 ENCOUNTER — Inpatient Hospital Stay: Payer: Medicare Other | Attending: Internal Medicine | Admitting: Internal Medicine

## 2019-09-03 VITALS — BP 125/94 | HR 71 | Temp 97.7°F | Resp 18 | Ht 63.0 in | Wt 208.6 lb

## 2019-09-03 DIAGNOSIS — C3492 Malignant neoplasm of unspecified part of left bronchus or lung: Secondary | ICD-10-CM

## 2019-09-03 DIAGNOSIS — Z85118 Personal history of other malignant neoplasm of bronchus and lung: Secondary | ICD-10-CM | POA: Insufficient documentation

## 2019-09-03 DIAGNOSIS — C3490 Malignant neoplasm of unspecified part of unspecified bronchus or lung: Secondary | ICD-10-CM | POA: Diagnosis not present

## 2019-09-03 LAB — CBC WITH DIFFERENTIAL (CANCER CENTER ONLY)
Abs Immature Granulocytes: 0.01 10*3/uL (ref 0.00–0.07)
Basophils Absolute: 0 10*3/uL (ref 0.0–0.1)
Basophils Relative: 1 %
Eosinophils Absolute: 0.2 10*3/uL (ref 0.0–0.5)
Eosinophils Relative: 3 %
HCT: 48.5 % — ABNORMAL HIGH (ref 36.0–46.0)
Hemoglobin: 16.1 g/dL — ABNORMAL HIGH (ref 12.0–15.0)
Immature Granulocytes: 0 %
Lymphocytes Relative: 22 %
Lymphs Abs: 1.6 10*3/uL (ref 0.7–4.0)
MCH: 29.8 pg (ref 26.0–34.0)
MCHC: 33.2 g/dL (ref 30.0–36.0)
MCV: 89.8 fL (ref 80.0–100.0)
Monocytes Absolute: 0.6 10*3/uL (ref 0.1–1.0)
Monocytes Relative: 8 %
Neutro Abs: 5 10*3/uL (ref 1.7–7.7)
Neutrophils Relative %: 66 %
Platelet Count: 205 10*3/uL (ref 150–400)
RBC: 5.4 MIL/uL — ABNORMAL HIGH (ref 3.87–5.11)
RDW: 12.9 % (ref 11.5–15.5)
WBC Count: 7.5 10*3/uL (ref 4.0–10.5)
nRBC: 0 % (ref 0.0–0.2)

## 2019-09-03 LAB — CMP (CANCER CENTER ONLY)
ALT: 21 U/L (ref 0–44)
AST: 10 U/L — ABNORMAL LOW (ref 15–41)
Albumin: 4.2 g/dL (ref 3.5–5.0)
Alkaline Phosphatase: 88 U/L (ref 38–126)
Anion gap: 11 (ref 5–15)
BUN: 11 mg/dL (ref 6–20)
CO2: 24 mmol/L (ref 22–32)
Calcium: 9.4 mg/dL (ref 8.9–10.3)
Chloride: 101 mmol/L (ref 98–111)
Creatinine: 1.66 mg/dL — ABNORMAL HIGH (ref 0.44–1.00)
GFR, Est AFR Am: 40 mL/min — ABNORMAL LOW (ref >60)
GFR, Estimated: 34 mL/min — ABNORMAL LOW (ref >60)
Glucose, Bld: 349 mg/dL — ABNORMAL HIGH (ref 70–99)
Potassium: 3.6 mmol/L (ref 3.5–5.1)
Sodium: 136 mmol/L (ref 135–145)
Total Bilirubin: 1.1 mg/dL (ref 0.3–1.2)
Total Protein: 7.3 g/dL (ref 6.5–8.1)

## 2019-09-03 NOTE — Progress Notes (Signed)
[place] [phone]   [phone]  OFFICE PROGRESS NOTE  [address], [place] [address]  DIAGNOSIS: Recurrent non-small cell lung cancer initially diagnosed as stage IA (T1b, N0, MX) adenocarcinoma in November 2009.   PRIOR THERAPY:  #1 Status post left upper lobe trisegmentectomy with lymph node dissection under the care of Dr. Arlyce Dice on March 24, 2009.  #2 Concurrent chemoradiation with carboplatin for AUC of 2 and paclitaxel 45 mg/M2 given weekly with radiation.   CURRENT THERAPY: Observation.  INTERVAL HISTORY: Theresa [name] 54 y.o. female returns to the clinic today for follow-up visit accompanied by her daughter.  The patient was last seen 4 years ago when she was followed by her primary care physician since that time.  She has been complaining of persistent cough and aching pain all over her body.  She had a recent CT scan of the chest by her primary care physician that showed suspicious nodule in the right lung.  The patient was referred to me today for evaluation and recommendation regarding her condition.  She has no current shortness of breath or hemoptysis.  She denied having any nausea, vomiting, diarrhea or constipation.  She has no headache or visual changes.   MEDICAL HISTORY: Past Medical History:  Diagnosis Date  . Anxiety   . Arthritis   . Blindness, legal    glaucoma and retinitis  pigmentosa  . Chronic eczema   . Chronic headaches 10-17-11   not migraines- just regular  . Constipation   . Depression   . Diabetes mellitus type II    diet   . Family history of anesthesia complication    1 son had malignant hyperthermia at 57yrs  tonsils rained  . Family history of malignant hyperthermia    1 son 16 years ago  . Fibromyalgia    denies, has not been diagnosited  . GERD (gastroesophageal reflux disease)   . Glaucoma   . History of chemotherapy 01/2011 to 03/2011   concurrent  w/radiation therapy  . Hx of radiation therapy 01-17-11 to 03-18-11   lung  . Hyperlipidemia   . Hypertension   . Lung cancer ([place])    lung ca dx 2010  . Malignant hyperthermia    As of 12/30/15, no personal MH history, but reported her son has a history of MH ~ 2000 (no confirmatory testing done)  . Onychomycosis   . Retinitis pigmentosa   . Shortness of breath    with exertion  . Sleep apnea    cpap 5 yrs    ALLERGIES:  is allergic to penicillins, codeine, morphine and related, and tomato.  MEDICATIONS:  Current Outpatient Medications  Medication Sig Dispense Refill  . albuterol (PROAIR HFA) 108 (90 Base) MCG/ACT inhaler Inhale 2 puffs into the lungs every 4 (four) hours as needed for wheezing or shortness of breath. 8 g 3  . albuterol (PROVENTIL) (2.5 MG/3ML) 0.083% nebulizer solution Take 6 mLs (5 mg total) by nebulization every 6 (six) hours as needed for wheezing or shortness of breath. 150 mL 1  . amLODipine (NORVASC) 10 MG tablet Take 1 tablet (10 mg total) by mouth daily. 90 tablet 3  . aspirin EC 81 MG tablet Take 1 tablet (81 mg total) by mouth daily. 30 tablet 11  . atorvastatin (LIPITOR) 80 MG tablet Take 1 tablet (80 mg total) by mouth daily. 90 tablet 3  . buPROPion (WELLBUTRIN XL) 150 MG 24  hr tablet Take 2 tablets (300 mg total) by mouth daily. 180 tablet 1  . carvedilol (COREG) 25 MG tablet TAKE 1 TABLET (25 MG TOTAL) BY MOUTH 2 (TWO) TIMES DAILY WITH A MEAL. 180 tablet 1  . clobetasol cream (TEMOVATE) 2.95 % Apply 1 application topically 2 (two) times daily. 60 g 1  . DULoxetine (CYMBALTA) 60 MG capsule TAKE 1 CAPSULE BY MOUTH EVERY DAY 90 capsule 0  . empagliflozin (JARDIANCE) 10 MG TABS tablet Take 10 mg by mouth daily. 90 tablet 3  . ezetimibe (ZETIA) 10 MG tablet Take 1 tablet (10 mg total) by mouth daily. 90 tablet 3  . fluticasone (FLONASE) 50 MCG/ACT nasal spray SPRAY 2 SPRAYS INTO EACH NOSTRIL EVERY DAY 48 mL 2  . gabapentin (NEURONTIN) 300 MG capsule TAKE 1  CAPSULE BY MOUTH THREE TIMES A DAY 270 capsule 1  . Insulin Glargine (LANTUS SOLOSTAR) 100 UNIT/ML Solostar Pen INJECT 20 UNITS EVERY MORNING 15 pen 2  . Insulin Pen Needle (PEN NEEDLES) 32G X 4 MM MISC 1 Stick by Does not apply route daily. 100 each 3  . linaclotide (LINZESS) 290 MCG CAPS capsule Take 1 capsule (290 mcg total) by mouth daily. 90 capsule 3  . lisinopril (ZESTRIL) 5 MG tablet Take 0.5 tablets (2.5 mg total) by mouth daily. 45 tablet 3  . loratadine (CLARITIN) 10 MG tablet Take 1 tablet (10 mg total) by mouth daily. 90 tablet 1  . metFORMIN (GLUCOPHAGE) 1000 MG tablet Take 1 tablet (1,000 mg total) by mouth 2 (two) times daily with a meal. 180 tablet 3  . montelukast (SINGULAIR) 10 MG tablet Take 1 tablet (10 mg total) by mouth at bedtime. 90 tablet 3  . omeprazole (PRILOSEC) 20 MG capsule TAKE 1 CAPSULE BY MOUTH EVERY DAY IN THE MORNING 90 capsule 1  . timolol (TIMOPTIC) 0.5 % ophthalmic solution Place 1 drop into both eyes 2 (two) times daily.    . Travoprost, BAK Free, (TRAVATAN) 0.004 % SOLN ophthalmic solution Place 1 drop into both eyes at bedtime.    Marland Kitchen umeclidinium-vilanterol (ANORO ELLIPTA) 62.5-25 MCG/INH AEPB Inhale 1 puff into the lungs daily. 1 each 0   No current facility-administered medications for this visit.    SURGICAL HISTORY:  Past Surgical History:  Procedure Laterality Date  . ABDOMINAL HYSTERECTOMY  09/19/2001   hx. of fibroids. TAH. Ovaries remain.   Marland Kitchen BACK SURGERY  06/2013  . CHEST TUBE INSERTION   04/01/08   L hydropneumothorax  . EYE SURGERY Bilateral    lens implant  . KNEE ARTHROSCOPY  10-17-11   bil. knee scope(left was torn ligament)  . LUMBAR LAMINECTOMY/DECOMPRESSION MICRODISCECTOMY  10/27/2011   Procedure: LUMBAR LAMINECTOMY/DECOMPRESSION MICRODISCECTOMY;  Surgeon: Johnn Hai, MD;  Location: [facility];  Service: Orthopedics;  Laterality: N/A;  L4-L5  . lung surgery  03/24/08   L vats, L thoracotomy and LUL trisegmentectomy with node dissection   . MAXIMUM ACCESS (MAS)POSTERIOR LUMBAR INTERBODY FUSION (PLIF) 1 LEVEL N/A 07/03/2013   Procedure: FOR MAXIMUM ACCESS (MAS) POSTERIOR LUMBAR INTERBODY FUSION (PLIF) 1 LEVEL;  Surgeon: Eustace Moore, MD;  Location: [facility];  Service: Neurosurgery;  Laterality: N/A;  FOR MAXIMUM ACCESS (MAS) POSTERIOR LUMBAR INTERBODY FUSION (PLIF) 1 LEVEL (L4-L5)  . PORT-A-CATH REMOVAL Right 11/26/2013   Procedure: REMOVAL PORT-A-CATH;  Surgeon: Gaye Pollack, MD;  Location: [facility];  Service: Thoracic;  Laterality: Right;  . PORTACATH PLACEMENT  02/11/2011   Procedure: INSERTION PORT-A-CATH;  Surgeon: Tory Emerald  Arlyce Dice, MD;  Location: [facility];  Service: Thoracic;  Laterality: Right;  9.6Fr. Pre-attached Power Port in Right Internal Jugular  -right chest-remains inplace 10-17-11  . SPINAL CORD STIMULATOR INSERTION N/A 02/03/2016   Procedure: LUMBAR SPINAL CORD STIMULATOR INSERTION;  Surgeon: Eustace Moore, MD;  Location: [facility];  Service: Neurosurgery;  Laterality: N/A;    REVIEW OF SYSTEMS:  Constitutional: positive for fatigue Eyes: negative Ears, nose, mouth, throat, and face: negative Respiratory: positive for cough and pleurisy/chest pain Cardiovascular: negative Gastrointestinal: negative Genitourinary:negative Integument/breast: negative Hematologic/lymphatic: negative Musculoskeletal:positive for arthralgias Neurological: negative Behavioral/Psych: negative Endocrine: negative Allergic/Immunologic: negative   PHYSICAL EXAMINATION: General appearance: alert, cooperative, fatigued and no distress Head: Normocephalic, without obvious abnormality, atraumatic Neck: no adenopathy Lymph nodes: Cervical, supraclavicular, and axillary nodes normal. Resp: clear to auscultation bilaterally Back: symmetric, no curvature. ROM normal. No CVA tenderness. Cardio: regular rate and rhythm, S1, S2 normal, no murmur, click, rub or gallop GI: soft, non-tender; bowel sounds normal; no masses,  no  organomegaly Extremities: extremities normal, atraumatic, no cyanosis or edema Neurologic: Alert and oriented X 3, normal strength and tone. Normal symmetric reflexes. Normal coordination and gait  ECOG PERFORMANCE STATUS: 1 - Symptomatic but completely ambulatory  Blood pressure (!) 125/94, pulse 71, temperature 97.7 F (36.5 C), temperature source Temporal, resp. rate 18, height 5\' 3"  (1.6 m), weight 208 lb 9.6 oz (94.6 kg), SpO2 100 %.  LABORATORY DATA: Lab Results  Component Value Date   WBC 7.5 09/03/2019   HGB 16.1 (H) 09/03/2019   HCT 48.5 (H) 09/03/2019   MCV 89.8 09/03/2019   PLT 205 09/03/2019      Chemistry      Component Value Date/Time   NA 136 04/02/2019 1000   NA 139 02/06/2015 0901   K 3.9 04/02/2019 1000   K 3.9 02/06/2015 0901   CL 97 04/02/2019 1000   CL 102 01/18/2012 0805   CO2 21 04/02/2019 1000   CO2 28 02/06/2015 0901   BUN 16 04/02/2019 1000   BUN 10.8 02/06/2015 0901   CREATININE 1.20 (H) 04/02/2019 1000   CREATININE 1.5 (H) 02/06/2015 0901      Component Value Date/Time   CALCIUM 9.6 04/02/2019 1000   CALCIUM 9.6 02/06/2015 0901   ALKPHOS 66 01/18/2017 1002   ALKPHOS 71 02/06/2015 0901   AST 14 01/18/2017 1002   AST 12 02/06/2015 0901   ALT 20 01/18/2017 1002   ALT 16 02/06/2015 0901   BILITOT 0.8 01/18/2017 1002   BILITOT 0.87 02/06/2015 0901       RADIOGRAPHIC STUDIES: CT Chest Wo Contrast  Result Date: 08/15/2019 CLINICAL DATA:  Persistent worsening cough.  History of lung cancer. EXAM: CT CHEST WITHOUT CONTRAST TECHNIQUE: Multidetector CT imaging of the chest was performed following the standard protocol without IV contrast. COMPARISON:  03/01/2016 FINDINGS: Cardiovascular: Heart size normal. Trace pericardial effusion evident. No abdominal aortic aneurysm. Mediastinum/Nodes: No mediastinal lymphadenopathy. No evidence for gross hilar lymphadenopathy although assessment is limited by the lack of intravenous contrast on today's study.  The esophagus has normal imaging features. There is no axillary lymphadenopathy. Lungs/Pleura: Volume loss left hemithorax is consistent with left upper lobectomy similar appearance of suprahilar scarring extending into the left apex. Centrilobular emphsyema noted. Interval development of a 1.1 x 0.8 cm macrolobulated right lower lobe nodule (68/4). 8 mm mixed attenuation right lower lobe nodule visible on 92/4, also new since prior. No focal airspace consolidation.  No pleural effusion. Upper Abdomen: Unremarkable. Musculoskeletal: No worrisome  lytic or sclerotic osseous abnormality. Thoracic spinal stimulator device noted. IMPRESSION: 1. Interval development of a 1.1 cm macrolobulated right lower lobe pulmonary nodule. Imaging features highly concerning for neoplasm with metachronous primary or metastatic disease both considerations. PET-CT may prove helpful to further evaluate. 2. Indeterminate 8 mm mixed attenuation right lower lobe pulmonary nodule, also new in the interval. Close attention on follow-up recommended. 3. Stable post treatment changes left hemithorax. 4. Emphysema (ICD10-J43.9). Electronically Signed   By: Misty Stanley M.D.   On: 08/15/2019 12:41   ASSESSMENT AND PLAN: This is a very pleasant 54 years old African-American female with history of non-small cell lung cancer diagnosed in 2009 status post concurrent chemoradiation. The patient has been in observation since that time with no concerning issues.  She had repeat CT scan of the chest by her primary care physician recently.  I personally and independently reviewed the scan images and discussed the results with the patient and her daughter today. Her scan showed suspicious right lower lobe pulmonary nodules concerning for disease metastasis versus metachronous primary. I recommended for the patient to have repeat PET scan for further evaluation of this lesion. I will see her back for follow-up visit in around 2 weeks for evaluation and  discussion of the PET scan results as well as treatment options. The patient was advised to follow with her primary care physician for her other medical issues including the diabetes mellitus that is currently uncontrolled. She was advised to call immediately if she has any other concerning symptoms in the interval. The patient voices understanding of current disease status and treatment options and is in agreement with the current care plan.  All questions were answered. The patient knows to call the clinic with any problems, questions or concerns. We can certainly see the patient much sooner if necessary. The total time spent in the appointment was 30 minutes. Disclaimer: This note was dictated with voice recognition software. Similar sounding words can inadvertently be transcribed and may not be corrected upon review.

## 2019-09-03 NOTE — Patient Instructions (Signed)
Steps to Quit Smoking Smoking tobacco is the leading cause of preventable death. It can affect almost every organ in the body. Smoking puts you and people around you at risk for many serious, long-lasting (chronic) diseases. Quitting smoking can be hard, but it is one of the best things that you can do for your health. It is never too late to quit. How do I get ready to quit? When you decide to quit smoking, make a plan to help you succeed. Before you quit:  Pick a date to quit. Set a date within the next 2 weeks to give you time to prepare.  Write down the reasons why you are quitting. Keep this list in places where you will see it often.  Tell your family, friends, and co-workers that you are quitting. Their support is important.  Talk with your doctor about the choices that may help you quit.  Find out if your health insurance will pay for these treatments.  Know the people, places, things, and activities that make you want to smoke (triggers). Avoid them. What first steps can I take to quit smoking?  Throw away all cigarettes at home, at work, and in your car.  Throw away the things that you use when you smoke, such as ashtrays and lighters.  Clean your car. Make sure to empty the ashtray.  Clean your home, including curtains and carpets. What can I do to help me quit smoking? Talk with your doctor about taking medicines and seeing a counselor at the same time. You are more likely to succeed when you do both.  If you are pregnant or breastfeeding, talk with your doctor about counseling or other ways to quit smoking. Do not take medicine to help you quit smoking unless your doctor tells you to do so. To quit smoking: Quit right away  Quit smoking totally, instead of slowly cutting back on how much you smoke over a period of time.  Go to counseling. You are more likely to quit if you go to counseling sessions regularly. Take medicine You may take medicines to help you quit. Some  medicines need a prescription, and some you can buy over-the-counter. Some medicines may contain a drug called nicotine to replace the nicotine in cigarettes. Medicines may:  Help you to stop having the desire to smoke (cravings).  Help to stop the problems that come when you stop smoking (withdrawal symptoms). Your doctor may ask you to use:  Nicotine patches, gum, or lozenges.  Nicotine inhalers or sprays.  Non-nicotine medicine that is taken by mouth. Find resources Find resources and other ways to help you quit smoking and remain smoke-free after you quit. These resources are most helpful when you use them often. They include:  Online chats with a counselor.  Phone quitlines.  Printed self-help materials.  Support groups or group counseling.  Text messaging programs.  Mobile phone apps. Use apps on your mobile phone or tablet that can help you stick to your quit plan. There are many free apps for mobile phones and tablets as well as websites. Examples include Quit Guide from the CDC and smokefree.gov  What things can I do to make it easier to quit?   Talk to your family and friends. Ask them to support and encourage you.  Call a phone quitline (1-800-QUIT-NOW), reach out to support groups, or work with a counselor.  Ask people who smoke to not smoke around you.  Avoid places that make you want to smoke,   such as: ? Bars. ? Parties. ? Smoke-break areas at work.  Spend time with people who do not smoke.  Lower the stress in your life. Stress can make you want to smoke. Try these things to help your stress: ? Getting regular exercise. ? Doing deep-breathing exercises. ? Doing yoga. ? Meditating. ? Doing a body scan. To do this, close your eyes, focus on one area of your body at a time from head to toe. Notice which parts of your body are tense. Try to relax the muscles in those areas. How will I feel when I quit smoking? Day 1 to 3 weeks Within the first 24 hours,  you may start to have some problems that come from quitting tobacco. These problems are very bad 2-3 days after you quit, but they do not often last for more than 2-3 weeks. You may get these symptoms:  Mood swings.  Feeling restless, nervous, angry, or annoyed.  Trouble concentrating.  Dizziness.  Strong desire for high-sugar foods and nicotine.  Weight gain.  Trouble pooping (constipation).  Feeling like you may vomit (nausea).  Coughing or a sore throat.  Changes in how the medicines that you take for other issues work in your body.  Depression.  Trouble sleeping (insomnia). Week 3 and afterward After the first 2-3 weeks of quitting, you may start to notice more positive results, such as:  Better sense of smell and taste.  Less coughing and sore throat.  Slower heart rate.  Lower blood pressure.  Clearer skin.  Better breathing.  Fewer sick days. Quitting smoking can be hard. Do not give up if you fail the first time. Some people need to try a few times before they succeed. Do your best to stick to your quit plan, and talk with your doctor if you have any questions or concerns. Summary  Smoking tobacco is the leading cause of preventable death. Quitting smoking can be hard, but it is one of the best things that you can do for your health.  When you decide to quit smoking, make a plan to help you succeed.  Quit smoking right away, not slowly over a period of time.  When you start quitting, seek help from your doctor, family, or friends. This information is not intended to replace advice given to you by your health care provider. Make sure you discuss any questions you have with your health care provider. Document Revised: 11/23/2018 Document Reviewed: 05/19/2018 Elsevier Patient Education  2020 [employer]  

## 2019-09-06 ENCOUNTER — Telehealth: Payer: Self-pay | Admitting: Internal Medicine

## 2019-09-06 NOTE — Telephone Encounter (Signed)
Scheduled per los. Called and left msg. Mailed printout  °

## 2019-09-09 ENCOUNTER — Emergency Department (HOSPITAL_COMMUNITY): Payer: Medicare Other

## 2019-09-09 ENCOUNTER — Encounter (HOSPITAL_COMMUNITY): Payer: Self-pay | Admitting: Emergency Medicine

## 2019-09-09 ENCOUNTER — Emergency Department (HOSPITAL_COMMUNITY)
Admission: EM | Admit: 2019-09-09 | Discharge: 2019-09-09 | Disposition: A | Discharge status: Home / Self Care | Payer: Medicare Other | Attending: Emergency Medicine | Admitting: Emergency Medicine

## 2019-09-09 ENCOUNTER — Other Ambulatory Visit: Payer: Self-pay

## 2019-09-09 DIAGNOSIS — Z794 Long term (current) use of insulin: Secondary | ICD-10-CM | POA: Insufficient documentation

## 2019-09-09 DIAGNOSIS — F17210 Nicotine dependence, cigarettes, uncomplicated: Secondary | ICD-10-CM | POA: Insufficient documentation

## 2019-09-09 DIAGNOSIS — R0602 Shortness of breath: Secondary | ICD-10-CM | POA: Diagnosis not present

## 2019-09-09 DIAGNOSIS — I129 Hypertensive chronic kidney disease with stage 1 through stage 4 chronic kidney disease, or unspecified chronic kidney disease: Secondary | ICD-10-CM | POA: Diagnosis not present

## 2019-09-09 DIAGNOSIS — E1122 Type 2 diabetes mellitus with diabetic chronic kidney disease: Secondary | ICD-10-CM | POA: Insufficient documentation

## 2019-09-09 DIAGNOSIS — J441 Chronic obstructive pulmonary disease with (acute) exacerbation: Secondary | ICD-10-CM | POA: Diagnosis not present

## 2019-09-09 DIAGNOSIS — R05 Cough: Secondary | ICD-10-CM | POA: Diagnosis present

## 2019-09-09 DIAGNOSIS — N183 Chronic kidney disease, stage 3 unspecified: Secondary | ICD-10-CM | POA: Insufficient documentation

## 2019-09-09 DIAGNOSIS — Z79899 Other long term (current) drug therapy: Secondary | ICD-10-CM | POA: Insufficient documentation

## 2019-09-09 DIAGNOSIS — Z85118 Personal history of other malignant neoplasm of bronchus and lung: Secondary | ICD-10-CM | POA: Diagnosis not present

## 2019-09-09 MED ORDER — PREDNISONE 20 MG PO TABS
60.0000 mg | ORAL_TABLET | Freq: Once | ORAL | Status: AC
  Administered 2019-09-09: 60 mg via ORAL
  Filled 2019-09-09: qty 3

## 2019-09-09 MED ORDER — BENZONATATE 100 MG PO CAPS: 100.0000 mg | ORAL_CAPSULE | Freq: Three times a day (TID) | ORAL | 0 refills | Status: DC | PRN

## 2019-09-09 MED ORDER — ALBUTEROL SULFATE HFA 108 (90 BASE) MCG/ACT IN AERS
4.0000 | INHALATION_SPRAY | Freq: Once | RESPIRATORY_TRACT | Status: AC
  Administered 2019-09-09: 4 via RESPIRATORY_TRACT
  Filled 2019-09-09: qty 6.7

## 2019-09-09 MED ORDER — PREDNISONE 20 MG PO TABS
40.0000 mg | ORAL_TABLET | Freq: Every day | ORAL | 0 refills | Status: AC
Start: 2019-09-09 — End: 2019-09-13

## 2019-09-09 MED ORDER — IPRATROPIUM BROMIDE HFA 17 MCG/ACT IN AERS
2.0000 | INHALATION_SPRAY | Freq: Once | RESPIRATORY_TRACT | Status: AC
  Administered 2019-09-09: 2 via RESPIRATORY_TRACT
  Filled 2019-09-09: qty 12.9

## 2019-09-09 NOTE — ED Provider Notes (Signed)
[facility] EMERGENCY DEPARTMENT Provider Note   CSN: [id number] Arrival date & time: 09/09/19  0522     History Chief Complaint  Patient presents with  . Cough  . Shortness of Breath    Theresa [name] is a 54 y.o. female.  The history is provided by the patient.  Cough Cough characteristics:  Non-productive Sputum characteristics:  Nondescript Severity:  Moderate Onset quality:  Gradual Duration:  2 weeks Timing:  Intermittent Progression:  Waxing and waning Chronicity:  New Context: upper respiratory infection (COPD history, inhalers helping some. nasal congestion. Neg for covid this past week, vaccinated.)   Relieved by:  Cough suppressants, decongestant, beta-agonist inhaler and ipratropium inhaler Exacerbated by: coughing. Associated symptoms: rhinorrhea, shortness of breath, sinus congestion and wheezing   Associated symptoms: no chest pain, no chills, no ear fullness, no ear pain, no eye discharge, no fever, no rash and no sore throat        Past Medical History:  Diagnosis Date  . Anxiety   . Arthritis   . Blindness, legal    glaucoma and retinitis  pigmentosa  . Chronic eczema   . Chronic headaches 10-17-11   not migraines- just regular  . Constipation   . Depression   . Diabetes mellitus type II    diet   . Family history of anesthesia complication    1 son had malignant hyperthermia at 9yrs  tonsils rained  . Family history of malignant hyperthermia    1 son 16 years ago  . Fibromyalgia    denies, has not been diagnosited  . GERD (gastroesophageal reflux disease)   . Glaucoma   . History of chemotherapy 01/2011 to 03/2011   concurrent w/radiation therapy  . Hx of radiation therapy 01-17-11 to 03-18-11   lung  . Hyperlipidemia   . Hypertension   . Lung cancer ([place])    lung ca dx 2010  . Malignant hyperthermia    As of 12/30/15, no personal MH history, but reported her son has a history of MH ~ 2000 (no confirmatory testing done)   . Onychomycosis   . Retinitis pigmentosa   . Shortness of breath    with exertion  . Sleep apnea    cpap 5 yrs    Patient Active Problem List   Diagnosis Date Noted  . Grief 10/31/2017  . Sinusitis, chronic 06/18/2017  . Cervical radiculopathy 06/18/2017  . COPD (chronic obstructive pulmonary disease) ([place]) 06/05/2017  . COPD exacerbation ([place]) 06/05/2017  . Breast pain, right 12/01/2016  . S/P insertion of spinal cord stimulator 02/03/2016  . Polycythemia, secondary 11/24/2013  . S/P lumbar spinal fusion 07/03/2013  . Obstructive sleep apnea 12/21/2012  . Hirsutism 12/13/2011  . Blindness, legal   . Sinusitis, acute 01/24/2011  . Migraine 01/24/2011  . Bronchogenic cancer of left lung ([place]) 01/20/2011  . Glaucoma   . Retinitis pigmentosa   . Chronic eczema   . Neuropathic pain 09/29/2010  . Chronic kidney disease (CKD), stage III (moderate) 05/17/2010  . Hypercholesterolemia 04/17/2009  . Essential hypertension 04/17/2009  . Morbid obesity ([place]) 10/02/2008  . Diabetes mellitus type 2, uncontrolled ([place]) 02/01/2007  . Major depressive disorder, recurrent episode ([place]) 05/11/2006  . Tobacco use disorder 05/11/2006    Past Surgical History:  Procedure Laterality Date  . ABDOMINAL HYSTERECTOMY  09/19/2001   hx. of fibroids. TAH. Ovaries remain.   Marland Kitchen BACK SURGERY  06/2013  . CHEST TUBE INSERTION   04/01/08   L hydropneumothorax  .  EYE SURGERY Bilateral    lens implant  . KNEE ARTHROSCOPY  10-17-11   bil. knee scope(left was torn ligament)  . LUMBAR LAMINECTOMY/DECOMPRESSION MICRODISCECTOMY  10/27/2011   Procedure: LUMBAR LAMINECTOMY/DECOMPRESSION MICRODISCECTOMY;  Surgeon: Johnn Hai, MD;  Location: [facility];  Service: Orthopedics;  Laterality: N/A;  L4-L5  . lung surgery  03/24/08   L vats, L thoracotomy and LUL trisegmentectomy with node dissection  . MAXIMUM ACCESS (MAS)POSTERIOR LUMBAR INTERBODY FUSION (PLIF) 1 LEVEL N/A 07/03/2013   Procedure: FOR MAXIMUM ACCESS  (MAS) POSTERIOR LUMBAR INTERBODY FUSION (PLIF) 1 LEVEL;  Surgeon: Eustace Moore, MD;  Location: [facility];  Service: Neurosurgery;  Laterality: N/A;  FOR MAXIMUM ACCESS (MAS) POSTERIOR LUMBAR INTERBODY FUSION (PLIF) 1 LEVEL (L4-L5)  . PORT-A-CATH REMOVAL Right 11/26/2013   Procedure: REMOVAL PORT-A-CATH;  Surgeon: Gaye Pollack, MD;  Location: [facility];  Service: Thoracic;  Laterality: Right;  . PORTACATH PLACEMENT  02/11/2011   Procedure: INSERTION PORT-A-CATH;  Surgeon: Pierre Bali, MD;  Location: [facility];  Service: Thoracic;  Laterality: Right;  9.6Fr. Pre-attached Power Port in Right Internal Jugular  -right chest-remains inplace 10-17-11  . SPINAL CORD STIMULATOR INSERTION N/A 02/03/2016   Procedure: LUMBAR SPINAL CORD STIMULATOR INSERTION;  Surgeon: Eustace Moore, MD;  Location: [facility];  Service: Neurosurgery;  Laterality: N/A;     OB History   No obstetric history on file.     Family History  Problem Relation Age of Onset  . Brain cancer Maternal Aunt   . Depression Mother   . Heart disease Mother   . Liver cancer Mother   . Depression Son   . Diabetes Maternal Grandmother   . Stroke Maternal Grandmother   . Depression Maternal Grandmother   . Depression Daughter   . Malignant hyperthermia Son 16       tonsils drained  . Colon cancer Neg Hx   . Rectal cancer Neg Hx   . Stomach cancer Neg Hx     Social History   Tobacco Use  . Smoking status: Current Some Day Smoker    Packs/day: 0.18    Years: 36.00    Pack years: 6.48    Types: Cigarettes    Start date: 03/14/1978  . Smokeless tobacco: Never Used  . Tobacco comment: starting chantix, hx 1 1/2 PPD(has decrease to 2 cigs per day)  Vaping Use  . Vaping Use: Never used  Substance Use Topics  . Alcohol use: No    Alcohol/week: 0.0 standard drinks  . Drug use: No    Home Medications Prior to Admission medications   Medication Sig Start Date End Date Taking? Authorizing Provider  albuterol (PROAIR HFA) 108 (90  Base) MCG/ACT inhaler Inhale 2 puffs into the lungs every 4 (four) hours as needed for wheezing or shortness of breath. 06/12/19   Rory Percy, DO  albuterol (PROVENTIL) (2.5 MG/3ML) 0.083% nebulizer solution Take 6 mLs (5 mg total) by nebulization every 6 (six) hours as needed for wheezing or shortness of breath. 02/26/18   Rory Percy, DO  amLODipine (NORVASC) 10 MG tablet Take 1 tablet (10 mg total) by mouth daily. 05/06/19   Rory Percy, DO  aspirin EC 81 MG tablet Take 1 tablet (81 mg total) by mouth daily. 01/08/16   Katheren Shams, DO  atorvastatin (LIPITOR) 80 MG tablet Take 1 tablet (80 mg total) by mouth daily. 02/07/19   Rory Percy, DO  buPROPion (WELLBUTRIN XL) 150 MG 24 hr tablet Take 2  tablets (300 mg total) by mouth daily. 03/18/19   Rory Percy, DO  carvedilol (COREG) 25 MG tablet TAKE 1 TABLET (25 MG TOTAL) BY MOUTH 2 (TWO) TIMES DAILY WITH A MEAL. 04/18/19   Rory Percy, DO  clobetasol cream (TEMOVATE) 2.84 % Apply 1 application topically 2 (two) times daily. 02/06/19   Rory Percy, DO  DULoxetine (CYMBALTA) 60 MG capsule TAKE 1 CAPSULE BY MOUTH EVERY DAY 07/11/19   Rory Percy, DO  empagliflozin (JARDIANCE) 10 MG TABS tablet Take 10 mg by mouth daily. 02/06/19   Rory Percy, DO  ezetimibe (ZETIA) 10 MG tablet Take 1 tablet (10 mg total) by mouth daily. 03/04/19   Rory Percy, DO  fluticasone (FLONASE) 50 MCG/ACT nasal spray SPRAY 2 SPRAYS INTO EACH NOSTRIL EVERY DAY 04/12/19   Rory Percy, DO  gabapentin (NEURONTIN) 300 MG capsule TAKE 1 CAPSULE BY MOUTH THREE TIMES A DAY 03/20/19   Rory Percy, DO  Insulin Glargine (LANTUS SOLOSTAR) 100 UNIT/ML Solostar Pen INJECT 20 UNITS EVERY MORNING 05/16/17   Rory Percy, DO  Insulin Pen Needle (PEN NEEDLES) 32G X 4 MM MISC 1 Stick by Does not apply route daily. 02/06/19   Rory Percy, DO  linaclotide (LINZESS) 290 MCG CAPS capsule Take 1 capsule (290 mcg total) by mouth daily. 02/06/19   Rory Percy, DO  lisinopril (ZESTRIL) 5 MG tablet Take 0.5 tablets (2.5 mg total) by mouth daily. 02/06/19   Rory Percy, DO  loratadine (CLARITIN) 10 MG tablet Take 1 tablet (10 mg total) by mouth daily. 03/05/19   Guadalupe Dawn, MD  metFORMIN (GLUCOPHAGE) 1000 MG tablet Take 1 tablet (1,000 mg total) by mouth 2 (two) times daily with a meal. 02/06/19   Rumball, Bryson Ha, DO  montelukast (SINGULAIR) 10 MG tablet Take 1 tablet (10 mg total) by mouth at bedtime. 08/14/19   Rory Percy, DO  omeprazole (PRILOSEC) 20 MG capsule TAKE 1 CAPSULE BY MOUTH EVERY DAY IN THE MORNING 07/15/19   Rory Percy, DO  predniSONE (DELTASONE) 20 MG tablet Take 2 tablets (40 mg total) by mouth daily for 4 days. 09/09/19 09/13/19  Abbas Beyene, DO  timolol (TIMOPTIC) 0.5 % ophthalmic solution Place 1 drop into both eyes 2 (two) times daily.    [provider]  Travoprost, BAK Free, (TRAVATAN) 0.004 % SOLN ophthalmic solution Place 1 drop into both eyes at bedtime.    [provider]  umeclidinium-vilanterol (ANORO ELLIPTA) 62.5-25 MCG/INH AEPB Inhale 1 puff into the lungs daily. 08/08/19   Caroline More, DO    Allergies    Penicillins, Codeine, Morphine and related, and Tomato  Review of Systems   Review of Systems  Constitutional: Negative for chills and fever.  HENT: Positive for rhinorrhea. Negative for ear pain and sore throat.   Eyes: Negative for pain, discharge and visual disturbance.  Respiratory: Positive for cough, shortness of breath and wheezing.   Cardiovascular: Negative for chest pain and palpitations.  Gastrointestinal: Negative for abdominal pain and vomiting.  Genitourinary: Negative for dysuria and hematuria.  Musculoskeletal: Negative for arthralgias and back pain.  Skin: Negative for color change and rash.  Neurological: Negative for seizures and syncope.  All other systems reviewed and are negative.   Physical Exam Updated Vital Signs  ED Triage Vitals  Enc  Vitals Group     BP 09/09/19 0533 (!) 136/103     Pulse Rate 09/09/19 0533 71     Resp 09/09/19 0533 16     Temp 09/09/19 0533 98.4  F (36.9 C)     Temp Source 09/09/19 0533 Oral     SpO2 09/09/19 0533 98 %     Weight 09/09/19 0534 207 lb 14.3 oz (94.3 kg)     Height 09/09/19 0534 5\' 3"  (1.6 m)     Head Circumference --      Peak Flow --      Pain Score 09/09/19 0533 0     Pain Loc --      Pain Edu? --      Excl. in [place]? --     Physical Exam Vitals and nursing note reviewed.  Constitutional:      General: She is not in acute distress.    Appearance: She is well-developed. She is not ill-appearing.  HENT:     Head: Normocephalic and atraumatic.  Eyes:     Extraocular Movements: Extraocular movements intact.     Conjunctiva/sclera: Conjunctivae normal.     Pupils: Pupils are equal, round, and reactive to light.  Cardiovascular:     Rate and Rhythm: Normal rate and regular rhythm.     Pulses: Normal pulses.     Heart sounds: Normal heart sounds. No murmur heard.   Pulmonary:     Effort: Pulmonary effort is normal. No respiratory distress.     Breath sounds: Wheezing (mild) present. No decreased breath sounds, rhonchi or rales.  Abdominal:     Palpations: Abdomen is soft.     Tenderness: There is no abdominal tenderness.  Musculoskeletal:     Cervical back: Normal range of motion and neck supple.  Skin:    General: Skin is warm and dry.     Capillary Refill: Capillary refill takes less than 2 seconds.  Neurological:     General: No focal deficit present.     Mental Status: She is alert.     ED Results / Procedures / Treatments   Labs (all labs ordered are listed, but only abnormal results are displayed) Labs Reviewed - No data to display  EKG EKG Interpretation  Date/Time:  Monday September 09 2019 05:25:53 EDT Ventricular Rate:  76 PR Interval:  168 QRS Duration: 74 QT Interval:  362 QTC Calculation: 407 R Axis:   107 Text Interpretation: Normal sinus rhythm  Rightward axis Cannot rule out Anterior infarct , age undetermined T wave abnormality, consider inferior ischemia Abnormal ECG No acute changes Confirmed by Lennice Sites [phone]) on 09/09/2019 7:43:09 AM   Radiology DG Chest 2 View  Result Date: 09/09/2019 CLINICAL DATA:  Shortness of breath. EXAM: CHEST - 2 VIEW COMPARISON:  Chest CT 08/14/2019 FINDINGS: Stable dense left upper lobe surgical and radiation scarring type changes. The cardiac silhouette is within normal limits and stable. No acute overlying pulmonary findings. Pulmonary nodule seen on the recent chest CT are not well visualized on this chest x-ray. No pleural effusions or pneumothorax. Thoracic spine spinal cord stimulator is stable. IMPRESSION: 1. No acute cardiopulmonary findings. 2. Stable left upper lobe surgical and radiation scarring type changes. Electronically Signed   By: Marijo Sanes M.D.   On: 09/09/2019 06:02    Procedures Procedures (including critical care time)  Medications Ordered in ED Medications  predniSONE (DELTASONE) tablet 60 mg (has no administration in time range)  albuterol (VENTOLIN HFA) 108 (90 Base) MCG/ACT inhaler 4 puff (has no administration in time range)  ipratropium (ATROVENT HFA) inhaler 2 puff (has no administration in time range)    ED Course  I have reviewed the triage vital signs and  the nursing notes.  Pertinent labs & imaging results that were available during my care of the patient were reviewed by me and considered in my medical decision making (see chart for details).    MDM Rules/Calculators/A&P                          AYMEE FOMBY is a 54 year old female with history of COPD, lung cancer in remission, diabetes who presents to the ED with cough.  Patient with unremarkable vitals.  No fever.  Recently saw primary care doctor is negative for coronavirus.  Vaccinated for coronavirus.  No chest pain.  Some shortness of breath.  Has been using inhaler without much relief.  On  allergy medication but not on steroids.  Has some scattered mild wheezing on exam.  No signs of respiratory distress.  Overall good air movement on exam.  Chest x-ray showed no infection or effusion.  No concern for PE.  EKG shows sinus rhythm.  No new ischemic changes.  No chest pain and doubt cardiac process.  Suspect ongoing COPD process versus allergic process versus viral process.  Will give breathing treatment here and prescribe steroids and have her follow-up with her primary care doctor.  Made patient aware of need for tighter glucose control while on prednisone.  Understands return precautions and discharged in ED in good condition.  This chart was dictated using voice recognition software.  Despite best efforts to proofread,  errors can occur which can change the documentation meaning.    Final Clinical Impression(s) / ED Diagnoses Final diagnoses:  COPD exacerbation ([place])    Rx / DC Orders ED Discharge Orders         Ordered    predniSONE (DELTASONE) 20 MG tablet  Daily     Discontinue  Reprint     09/09/19 0758           Lennice Sites, DO 09/09/19 7829

## 2019-09-09 NOTE — Discharge Instructions (Addendum)
Follow-up with primary care doctor later this week.  Please return to the ED if symptoms worsen.

## 2019-09-09 NOTE — ED Triage Notes (Addendum)
Per EMS, pt from home, sick w/ cough and SOB since Thursday.  Pt states she did go to her PCP and took the prescribed medications w/ no changes.  Pt is legally blind.    154/100 90 pulse 22RR 98% RA 97.2 temp  Pt reports negative COVID test on Thursday

## 2019-09-10 ENCOUNTER — Telehealth: Payer: Self-pay

## 2019-09-10 NOTE — Telephone Encounter (Signed)
Patient calls nurse line regarding COPD exacerbation. Patient was seen in the hospital last night and was discharged with tessalon and 4 day supply of prednisone. Patient is requesting additional prednisone as she states that the prednisone is the only thing that will help. Advised patient that we have to be cautious with long courses of steroid therapy, especially for diabetic patients. Patient requesting returned call from provider for further management. If patient needs further evaluation, patient prefers virtual visit as she has transportation difficulties.   ED precautions given.   Forwarding to PCP  Talbot Grumbling, RN

## 2019-09-10 NOTE — Telephone Encounter (Signed)
Called and spoke with patient. Informed of below. Patient will return call to schedule appointment with worsening or no improvement of symptoms.   Talbot Grumbling, RN

## 2019-09-10 NOTE — Telephone Encounter (Signed)
Looks like she was just started on it last night. She should complete her current steroid course and if she is still having difficulty, she should be seen for an in-person appointment. Likely ok for her to have a non-CIDD appointment as she had confirmed COVID negative testing last week and is fully vaccinated.   Also, she has had a persistent nonproductive cough for the past few months and has been treated for COPD exacerbation in this time frame. It also doesn't appear her cough has changed based on ED note. She has f/u with her Oncologist next week and should keep this appointment.   Happy to answer any questions she has.

## 2019-09-17 ENCOUNTER — Ambulatory Visit (HOSPITAL_COMMUNITY)
Admission: RE | Admit: 2019-09-17 | Discharge: 2019-09-17 | Disposition: A | Discharge status: Home / Self Care | Payer: Medicare Other | Source: Ambulatory Visit | Attending: Internal Medicine | Admitting: Internal Medicine

## 2019-09-17 ENCOUNTER — Other Ambulatory Visit: Payer: Self-pay

## 2019-09-17 DIAGNOSIS — R918 Other nonspecific abnormal finding of lung field: Secondary | ICD-10-CM | POA: Insufficient documentation

## 2019-09-17 DIAGNOSIS — C3490 Malignant neoplasm of unspecified part of unspecified bronchus or lung: Secondary | ICD-10-CM | POA: Diagnosis not present

## 2019-09-17 LAB — GLUCOSE, CAPILLARY: Glucose-Capillary: 233 mg/dL — ABNORMAL HIGH (ref 70–99)

## 2019-09-17 MED ORDER — FLUDEOXYGLUCOSE F - 18 (FDG) INJECTION
10.2000 | Freq: Once | INTRAVENOUS | Status: AC | PRN
  Administered 2019-09-17: 10.2 via INTRAVENOUS

## 2019-09-24 ENCOUNTER — Encounter: Payer: Self-pay | Admitting: Internal Medicine

## 2019-09-24 ENCOUNTER — Inpatient Hospital Stay: Payer: Medicare Other | Attending: Internal Medicine | Admitting: Internal Medicine

## 2019-09-24 ENCOUNTER — Other Ambulatory Visit: Payer: Self-pay

## 2019-09-24 VITALS — BP 148/80 | HR 74 | Temp 97.7°F | Resp 20 | Ht 63.0 in | Wt 209.4 lb

## 2019-09-24 DIAGNOSIS — C3490 Malignant neoplasm of unspecified part of unspecified bronchus or lung: Secondary | ICD-10-CM | POA: Diagnosis not present

## 2019-09-24 DIAGNOSIS — C3492 Malignant neoplasm of unspecified part of left bronchus or lung: Secondary | ICD-10-CM | POA: Diagnosis not present

## 2019-09-24 DIAGNOSIS — Z85118 Personal history of other malignant neoplasm of bronchus and lung: Secondary | ICD-10-CM | POA: Insufficient documentation

## 2019-09-24 DIAGNOSIS — I10 Essential (primary) hypertension: Secondary | ICD-10-CM | POA: Diagnosis not present

## 2019-09-24 MED ORDER — DOXYCYCLINE HYCLATE 100 MG PO TABS
100.0000 mg | ORAL_TABLET | Freq: Two times a day (BID) | ORAL | 0 refills | Status: DC
Start: 2019-09-24 — End: 2019-11-07

## 2019-09-24 NOTE — Progress Notes (Signed)
[facility] [phone]   [phone]  OFFICE PROGRESS NOTE  Gladys Damme, MD [address] [place] [address]  DIAGNOSIS: Recurrent non-small cell lung cancer initially diagnosed as stage IA (T1b, N0, MX) adenocarcinoma in November 2009.   PRIOR THERAPY:  #1 Status post left upper lobe trisegmentectomy with lymph node dissection under the care of Dr. Arlyce Dice on March 24, 2009.  #2 Concurrent chemoradiation with carboplatin for AUC of 2 and paclitaxel 45 mg/M2 given weekly with radiation.   CURRENT THERAPY: Observation.  INTERVAL HISTORY: Theresa [name] 54 y.o. female returns to the clinic today for follow-up visit accompanied by her husband and daughter.  The patient is feeling fine today with no concerning complaints except for the persistent cough productive of whitish sputum.  She tried over-the-counter cough medication with no improvement.  She denied having any chest pain but has shortness of breath with exertion with no hemoptysis.  She denied having any weight loss or night sweats.  She has no nausea, vomiting, diarrhea or constipation.  She denied having any headache or visual changes.  The patient was found to have suspicious macrolobulated right lower lobe pulmonary nodule on CT scan of the chest performed in early June 2021.  I recommended for the patient to have a PET scan and she is here today for evaluation and discussion of her PET scan results and further recommendation regarding her condition.   MEDICAL HISTORY: Past Medical History:  Diagnosis Date  . Anxiety   . Arthritis   . Blindness, legal    glaucoma and retinitis  pigmentosa  . Chronic eczema   . Chronic headaches 10-17-11   not migraines- just regular  . Constipation   . Depression   . Diabetes mellitus type II    diet   . Family history of anesthesia complication    1 son had malignant hyperthermia at 15yrs  tonsils rained  . Family history of malignant  hyperthermia    1 son 16 years ago  . Fibromyalgia    denies, has not been diagnosited  . GERD (gastroesophageal reflux disease)   . Glaucoma   . History of chemotherapy 01/2011 to 03/2011   concurrent w/radiation therapy  . Hx of radiation therapy 01-17-11 to 03-18-11   lung  . Hyperlipidemia   . Hypertension   . Lung cancer ([facility])    lung ca dx 2010  . Malignant hyperthermia    As of 12/30/15, no personal MH history, but reported her son has a history of MH ~ 2000 (no confirmatory testing done)  . Onychomycosis   . Retinitis pigmentosa   . Shortness of breath    with exertion  . Sleep apnea    cpap 5 yrs    ALLERGIES:  is allergic to penicillins, codeine, morphine and related, and tomato.  MEDICATIONS:  Current Outpatient Medications  Medication Sig Dispense Refill  . albuterol (PROAIR HFA) 108 (90 Base) MCG/ACT inhaler Inhale 2 puffs into the lungs every 4 (four) hours as needed for wheezing or shortness of breath. 8 g 3  . albuterol (PROVENTIL) (2.5 MG/3ML) 0.083% nebulizer solution Take 6 mLs (5 mg total) by nebulization every 6 (six) hours as needed for wheezing or shortness of breath. 150 mL 1  . amLODipine (NORVASC) 10 MG tablet Take 1 tablet (10 mg total) by mouth daily. 90 tablet 3  . aspirin EC 81 MG tablet Take 1 tablet (81 mg total) by mouth daily.  30 tablet 11  . atorvastatin (LIPITOR) 80 MG tablet Take 1 tablet (80 mg total) by mouth daily. 90 tablet 3  . benzonatate (TESSALON PERLES) 100 MG capsule Take 1 capsule (100 mg total) by mouth 3 (three) times daily as needed for up to 20 doses for cough. 20 capsule 0  . buPROPion (WELLBUTRIN XL) 150 MG 24 hr tablet Take 2 tablets (300 mg total) by mouth daily. 180 tablet 1  . carvedilol (COREG) 25 MG tablet TAKE 1 TABLET (25 MG TOTAL) BY MOUTH 2 (TWO) TIMES DAILY WITH A MEAL. 180 tablet 1  . clobetasol cream (TEMOVATE) 2.35 % Apply 1 application topically 2 (two) times daily. 60 g 1  . DULoxetine (CYMBALTA) 60 MG capsule  TAKE 1 CAPSULE BY MOUTH EVERY DAY 90 capsule 0  . empagliflozin (JARDIANCE) 10 MG TABS tablet Take 10 mg by mouth daily. 90 tablet 3  . ezetimibe (ZETIA) 10 MG tablet Take 1 tablet (10 mg total) by mouth daily. 90 tablet 3  . fluticasone (FLONASE) 50 MCG/ACT nasal spray SPRAY 2 SPRAYS INTO EACH NOSTRIL EVERY DAY 48 mL 2  . gabapentin (NEURONTIN) 300 MG capsule TAKE 1 CAPSULE BY MOUTH THREE TIMES A DAY 270 capsule 1  . Insulin Glargine (LANTUS SOLOSTAR) 100 UNIT/ML Solostar Pen INJECT 20 UNITS EVERY MORNING 15 pen 2  . Insulin Pen Needle (PEN NEEDLES) 32G X 4 MM MISC 1 Stick by Does not apply route daily. 100 each 3  . linaclotide (LINZESS) 290 MCG CAPS capsule Take 1 capsule (290 mcg total) by mouth daily. 90 capsule 3  . lisinopril (ZESTRIL) 5 MG tablet Take 0.5 tablets (2.5 mg total) by mouth daily. 45 tablet 3  . loratadine (CLARITIN) 10 MG tablet Take 1 tablet (10 mg total) by mouth daily. 90 tablet 1  . metFORMIN (GLUCOPHAGE) 1000 MG tablet Take 1 tablet (1,000 mg total) by mouth 2 (two) times daily with a meal. 180 tablet 3  . montelukast (SINGULAIR) 10 MG tablet Take 1 tablet (10 mg total) by mouth at bedtime. 90 tablet 3  . omeprazole (PRILOSEC) 20 MG capsule TAKE 1 CAPSULE BY MOUTH EVERY DAY IN THE MORNING 90 capsule 1  . timolol (TIMOPTIC) 0.5 % ophthalmic solution Place 1 drop into both eyes 2 (two) times daily.    . Travoprost, BAK Free, (TRAVATAN) 0.004 % SOLN ophthalmic solution Place 1 drop into both eyes at bedtime.    Marland Kitchen umeclidinium-vilanterol (ANORO ELLIPTA) 62.5-25 MCG/INH AEPB Inhale 1 puff into the lungs daily. 1 each 0   No current facility-administered medications for this visit.    SURGICAL HISTORY:  Past Surgical History:  Procedure Laterality Date  . ABDOMINAL HYSTERECTOMY  09/19/2001   hx. of fibroids. TAH. Ovaries remain.   Marland Kitchen BACK SURGERY  06/2013  . CHEST TUBE INSERTION   04/01/08   L hydropneumothorax  . EYE SURGERY Bilateral    lens implant  . KNEE ARTHROSCOPY   10-17-11   bil. knee scope(left was torn ligament)  . LUMBAR LAMINECTOMY/DECOMPRESSION MICRODISCECTOMY  10/27/2011   Procedure: LUMBAR LAMINECTOMY/DECOMPRESSION MICRODISCECTOMY;  Surgeon: Johnn Hai, MD;  Location: [facility];  Service: Orthopedics;  Laterality: N/A;  L4-L5  . lung surgery  03/24/08   L vats, L thoracotomy and LUL trisegmentectomy with node dissection  . MAXIMUM ACCESS (MAS)POSTERIOR LUMBAR INTERBODY FUSION (PLIF) 1 LEVEL N/A 07/03/2013   Procedure: FOR MAXIMUM ACCESS (MAS) POSTERIOR LUMBAR INTERBODY FUSION (PLIF) 1 LEVEL;  Surgeon: Eustace Moore, MD;  Location: [facility];  Service: Neurosurgery;  Laterality: N/A;  FOR MAXIMUM ACCESS (MAS) POSTERIOR LUMBAR INTERBODY FUSION (PLIF) 1 LEVEL (L4-L5)  . PORT-A-CATH REMOVAL Right 11/26/2013   Procedure: REMOVAL PORT-A-CATH;  Surgeon: Gaye Pollack, MD;  Location: [facility];  Service: Thoracic;  Laterality: Right;  . PORTACATH PLACEMENT  02/11/2011   Procedure: INSERTION PORT-A-CATH;  Surgeon: Pierre Bali, MD;  Location: [facility];  Service: Thoracic;  Laterality: Right;  9.6Fr. Pre-attached Power Port in Right Internal Jugular  -right chest-remains inplace 10-17-11  . SPINAL CORD STIMULATOR INSERTION N/A 02/03/2016   Procedure: LUMBAR SPINAL CORD STIMULATOR INSERTION;  Surgeon: Eustace Moore, MD;  Location: [facility];  Service: Neurosurgery;  Laterality: N/A;    REVIEW OF SYSTEMS:  Constitutional: positive for fatigue Eyes: negative Ears, nose, mouth, throat, and face: negative Respiratory: positive for cough and dyspnea on exertion Cardiovascular: negative Gastrointestinal: negative Genitourinary:negative Integument/breast: negative Hematologic/lymphatic: negative Musculoskeletal:positive for arthralgias and muscle weakness Neurological: negative Behavioral/Psych: negative Endocrine: negative Allergic/Immunologic: negative   PHYSICAL EXAMINATION: General appearance: alert, cooperative, fatigued and no distress Head: Normocephalic,  without obvious abnormality, atraumatic Neck: no adenopathy Lymph nodes: Cervical, supraclavicular, and axillary nodes normal. Resp: clear to auscultation bilaterally Back: symmetric, no curvature. ROM normal. No CVA tenderness. Cardio: regular rate and rhythm, S1, S2 normal, no murmur, click, rub or gallop GI: soft, non-tender; bowel sounds normal; no masses,  no organomegaly Extremities: extremities normal, atraumatic, no cyanosis or edema Neurologic: Alert and oriented X 3, normal strength and tone. Normal symmetric reflexes. Normal coordination and gait  ECOG PERFORMANCE STATUS: 1 - Symptomatic but completely ambulatory  Blood pressure (!) 148/80, pulse 74, temperature 97.7 F (36.5 C), temperature source Temporal, resp. rate 20, height 5\' 3"  (1.6 m), weight 209 lb 6.4 oz (95 kg), SpO2 100 %.  LABORATORY DATA: Lab Results  Component Value Date   WBC 7.5 09/03/2019   HGB 16.1 (H) 09/03/2019   HCT 48.5 (H) 09/03/2019   MCV 89.8 09/03/2019   PLT 205 09/03/2019      Chemistry      Component Value Date/Time   NA 136 09/03/2019 1447   NA 136 04/02/2019 1000   NA 139 02/06/2015 0901   K 3.6 09/03/2019 1447   K 3.9 02/06/2015 0901   CL 101 09/03/2019 1447   CL 102 01/18/2012 0805   CO2 24 09/03/2019 1447   CO2 28 02/06/2015 0901   BUN 11 09/03/2019 1447   BUN 16 04/02/2019 1000   BUN 10.8 02/06/2015 0901   CREATININE 1.66 (H) 09/03/2019 1447   CREATININE 1.5 (H) 02/06/2015 0901      Component Value Date/Time   CALCIUM 9.4 09/03/2019 1447   CALCIUM 9.6 02/06/2015 0901   ALKPHOS 88 09/03/2019 1447   ALKPHOS 71 02/06/2015 0901   AST 10 (L) 09/03/2019 1447   AST 12 02/06/2015 0901   ALT 21 09/03/2019 1447   ALT 16 02/06/2015 0901   BILITOT 1.1 09/03/2019 1447   BILITOT 0.87 02/06/2015 0901       RADIOGRAPHIC STUDIES: DG Chest 2 View  Result Date: 09/09/2019 CLINICAL DATA:  Shortness of breath. EXAM: CHEST - 2 VIEW COMPARISON:  Chest CT 08/14/2019 FINDINGS: Stable  dense left upper lobe surgical and radiation scarring type changes. The cardiac silhouette is within normal limits and stable. No acute overlying pulmonary findings. Pulmonary nodule seen on the recent chest CT are not well visualized on this chest x-ray. No pleural effusions or pneumothorax. Thoracic spine spinal cord stimulator is stable.  IMPRESSION: 1. No acute cardiopulmonary findings. 2. Stable left upper lobe surgical and radiation scarring type changes. Electronically Signed   By: Marijo Sanes M.D.   On: 09/09/2019 06:02   NM PET Image Restage (PS) Skull Base to Thigh  Result Date: 09/17/2019 CLINICAL DATA:  Subsequent treatment strategy for non-small-cell lung cancer. EXAM: NUCLEAR MEDICINE PET SKULL BASE TO THIGH TECHNIQUE: 10.2 mCi F-18 FDG was injected intravenously. Full-ring PET imaging was performed from the skull base to thigh after the radiotracer. CT data was obtained and used for attenuation correction and anatomic localization. Fasting blood glucose: 233 mg/dl COMPARISON:  11/24/2010. FINDINGS: Mediastinal blood pool activity: SUV max 3.0 Liver activity: SUV max NA NECK: No hypermetabolic lymph nodes in the neck. Incidental CT findings: none CHEST: The right lower lobe pulmonary nodules identified on recent diagnostic CT chest are again identified. 11 mm macrolobulated right lower lobe lesion shows no hypermetabolism with SUV max = 1.5 on PET imaging (image 69/4 today). Likely some misregistration of activity on PET imaging. The smaller 8 mm right lower lobe nodule (image 76/4 today) shows no hypermetabolism on PET imaging. Low level FDG uptake identified in left apical scarring. Incidental CT findings: Postsurgical/treatment changes noted left suprahilar region and left apex, similar to prior. ABDOMEN/PELVIS: No abnormal hypermetabolic activity within the liver, pancreas, adrenal glands, or spleen. No hypermetabolic lymph nodes in the abdomen or pelvis. Incidental CT findings: Bilateral  low-attenuation renal lesions show no hypermetabolism, compatible with cysts. SKELETON: No focal hypermetabolic activity to suggest skeletal metastasis. Incidental CT findings: Thoracic spinal stimulator device noted. Patient is status post lumbar fusion. IMPRESSION: 1. No hypermetabolism discernible in either of the new right lower lobe pulmonary nodules. While this is reassuring, low-grade or well differentiated neoplasm can be poorly FDG avid and close follow-up will be required. 2. No unexpected or suspicious hypermetabolism in the neck, abdomen, or pelvis. 3. Stable post treatment changes left hemithorax with low level uptake compatible with scarring. Electronically Signed   By: Misty Stanley M.D.   On: 09/17/2019 11:54   ASSESSMENT AND PLAN: This is a very pleasant 54 years old African-American female with history of non-small cell lung cancer diagnosed in 2009 status post concurrent chemoradiation. The patient has been in observation since that time with no concerning issues.  She was found on recent CT scan of the chest in early June 2021 to have suspicious right lower lobe pulmonary nodules concerning for disease metastasis versus metachronous primary. I ordered a PET scan which was performed recently and showed no hypermetabolic activity in either of the new right lower lobe pulmonary nodules which is reassuring but low-grade or well differentiated neoplasm could not be completely excluded. I discussed the scan results with the patient and her family.  I recommended for her to continue on observation with repeat CT scan of the chest in 6 months for further evaluation of these lesions. For the persistent cough productive of sputum, I will start the patient empirically on doxycycline 100 mg p.o. twice daily for 1 week.  She was advised to follow-up with her primary care physician for any further recommendation. For hypertension, the patient was advised to take her blood pressure medication as  prescribed and to monitor it closely at home. For the smoking, I strongly encouraged the patient to quit smoking. She was advised to call immediately if she has any concerning symptoms in the interval. The patient voices understanding of current disease status and treatment options and is in agreement with the  current care plan. All questions were answered. The patient knows to call the clinic with any problems, questions or concerns. We can certainly see the patient much sooner if necessary.  Disclaimer: This note was dictated with voice recognition software. Similar sounding words can inadvertently be transcribed and may not be corrected upon review.

## 2019-09-25 ENCOUNTER — Telehealth: Payer: Self-pay | Admitting: Internal Medicine

## 2019-09-25 NOTE — Telephone Encounter (Signed)
Scheduled appt per 7/13 los - mailed reminder letter with appt date and time

## 2019-09-27 ENCOUNTER — Other Ambulatory Visit: Payer: Self-pay

## 2019-10-08 ENCOUNTER — Other Ambulatory Visit: Payer: Self-pay

## 2019-10-08 NOTE — Telephone Encounter (Signed)
Patient calls nurse line requesting refill on Gabapentin. Patient reports no more refills at her pharmacy. I see where this was denied a few weeks ago, however unsure if she needs an apt. Please advise.

## 2019-10-09 ENCOUNTER — Ambulatory Visit: Payer: Medicare Other

## 2019-10-10 NOTE — Telephone Encounter (Signed)
Patient has an appt on access to care.  Micha Erck,CMA

## 2019-10-10 NOTE — Telephone Encounter (Signed)
Patient calls RN line requesting gabapentin. She does have a history of diabetic nephropathy. According to the chart, Dr. Ky Barban discontinued gabapentin in January of this year. I have inherited this patient on my panel as Dr. Ky Barban has now graduated. Patient has a current RX of duoloxetine per the chart. Crcl decreased to 58mL/min per labs done by oncologist recently. Since I did not prescribe these medications initially and patient has reduced cr historically and is being treated with other medications, I cannot just refill based on a phone call, but need patient to schedule an appointment to best address concerns.  Gladys Damme, MD [facility], PGY-2

## 2019-10-11 ENCOUNTER — Ambulatory Visit (INDEPENDENT_AMBULATORY_CARE_PROVIDER_SITE_OTHER): Payer: Medicare Other | Admitting: Family Medicine

## 2019-10-11 ENCOUNTER — Other Ambulatory Visit: Payer: Self-pay

## 2019-10-11 DIAGNOSIS — R053 Chronic cough: Secondary | ICD-10-CM

## 2019-10-11 DIAGNOSIS — R05 Cough: Secondary | ICD-10-CM | POA: Diagnosis not present

## 2019-10-11 MED ORDER — ALBUTEROL SULFATE HFA 108 (90 BASE) MCG/ACT IN AERS: 2.0000 | INHALATION_SPRAY | RESPIRATORY_TRACT | 3 refills | Status: DC | PRN

## 2019-10-11 MED ORDER — GABAPENTIN 300 MG PO CAPS: ORAL_CAPSULE | ORAL | 1 refills | Status: DC

## 2019-10-11 MED ORDER — NICOTINE 21 MG/24HR TD PT24: 21.0000 mg | MEDICATED_PATCH | Freq: Every day | TRANSDERMAL | 0 refills | Status: DC

## 2019-10-11 MED ORDER — BENZONATATE 100 MG PO CAPS
100.0000 mg | ORAL_CAPSULE | Freq: Two times a day (BID) | ORAL | 0 refills | Status: DC | PRN
Start: 2019-10-11 — End: 2020-01-29

## 2019-10-11 MED FILL — NICOTINE 21 MG/24HR PATCH: 21 | 14 days supply | Qty: 14 | Fill #0

## 2019-10-11 NOTE — Patient Instructions (Signed)
It was a pleasure to meet you today.  I am sorry you are having so much difficulty with your cough.  Please continue to take Tylenol and ibuprofen for the pain.  I have also refilled your Neurontin.  I am prescribing you Tessalon Perles for the cough as well.  I would like for you to stop taking your lisinopril blood pressure medication because this may be the cause of your cough.  If you notice significant improvement please let us know so we can change your blood pressure medication.  I am also giving you a prescription for nicotine patch.  You can take this to the [facility] outpatient pharmacy and we are going to help it get paid for.  If you are cough worsens, you have fever, chills, loss of taste or smell, worsening shortness of breath, worsening pain please seek medical attention immediately.  If you have any questions or concerns please feel free to call our clinic.  I hope you have a wonderful afternoon!  Cough, Adult A cough helps to clear your throat and lungs. A cough may be a sign of an illness or another medical condition. An acute cough may only last 2-3 weeks, while a chronic cough may last 8 or more weeks. Many things can cause a cough. They include:  Germs (viruses or bacteria) that attack the airway.  Breathing in things that bother (irritate) your lungs.  Allergies.  Asthma.  Mucus that runs down the back of your throat (postnasal drip).  Smoking.  Acid backing up from the stomach into the tube that moves food from the mouth to the stomach (gastroesophageal reflux).  Some medicines.  Lung problems.  Other medical conditions, such as heart failure or a blood clot in the lung (pulmonary embolism). Follow these instructions at home: Medicines  Take over-the-counter and prescription medicines only as told by your doctor.  Talk with your doctor before you take medicines that stop a cough (coughsuppressants). Lifestyle   Do not smoke, and try not to be around smoke.  Do not use any products that contain nicotine or tobacco, such as cigarettes, e-cigarettes, and chewing tobacco. If you need help quitting, ask your doctor.  Drink enough fluid to keep your pee (urine) pale yellow.  Avoid caffeine.  Do not drink alcohol if your doctor tells you not to drink. General instructions   Watch for any changes in your cough. Tell your doctor about them.  Always cover your mouth when you cough.  Stay away from things that make you cough, such as perfume, candles, campfire smoke, or cleaning products.  If the air is dry, use a cool mist vaporizer or humidifier in your home.  If your cough is worse at night, try using extra pillows to raise your head up higher while you sleep.  Rest as needed.  Keep all follow-up visits as told by your doctor. This is important. Contact a doctor if:  You have new symptoms.  You cough up pus.  Your cough does not get better after 2-3 weeks, or your cough gets worse.  Cough medicine does not help your cough and you are not sleeping well.  You have pain that gets worse or pain that is not helped with medicine.  You have a fever.  You are losing weight and you do not know why.  You have night sweats. Get help right away if:  You cough up blood.  You have trouble breathing.  Your heartbeat is very fast. These symptoms  may be an emergency. Do not wait to see if the symptoms will go away. Get medical help right away. Call your local emergency services (911 in the [place] Do not drive yourself to the hospital. Summary  A cough helps to clear your throat and lungs. Many things can cause a cough.  Take over-the-counter and prescription medicines only as told by your doctor.  Always cover your mouth when you cough.  Contact a doctor if you have new symptoms or you have a cough that does not get better or gets worse. This information is not intended to replace advice given to you by your health care provider. Make  sure you discuss any questions you have with your health care provider. Document Revised: 03/19/2018 Document Reviewed: 03/19/2018 Elsevier Patient Education  [employer].

## 2019-10-11 NOTE — Progress Notes (Signed)
    SUBJECTIVE:   CHIEF COMPLAINT / HPI:   Chronic cough Patient reports continued chronic cough since June 17.  She has seen providers as well as her oncologist.  She had initially given a course of steroids and then given a course antibiotics by her oncologist on 7/13, Patient reports that she had some relief from each course of treatment but that it never completely resolved. Denies any fever, chills, sick contacts. Reports that she was COVID tested initially and that those results were negative. The patient is complaining of worsening rib pain from this chronic coughing and occasional post tussive emesis. Patient has been taking mucinex daily for over a month. Denies any chest pain except when coughing. Feels short of breath after a coughing spell but is otherwise at baseline. Of note patient does have hx of lung caner which was treated. She is currently being monitored by oncology for recurrence.   Smoking Cessation  Patient is a current smoker who reports that she was smoking more than a pack per day prior to contracting this persistent cough. Says that she has had to cut down and is now smoking 2-3 cigarettes per day. Is interested in quitting and would like nicotine patches.    PERTINENT  PMH / PSH: Hx of lung cancer s/p chemo and radiation   OBJECTIVE:   BP (!) 108/64   Pulse 84   SpO2 99%   General: Well-appearing 54 year old has occasional cough which last 10 to 20 seconds.  Patient has frothy white sputum production Cardiac: Regular rate and rhythm, no murmurs appreciated Respiratory: Lungs are clear to auscultation bilaterally, patient does have upper airway congestion with rhinorrhea. Abdomen: Soft, nontender, positive bowel sounds   ASSESSMENT/PLAN:   Chronic cough Chronic cough for approximately 6 weeks. Has had one course of antibiotics and once course of steroids with minor improvement with each. Patient has been taking mucinex daily and cough drops. Clear frothy sputum  with post tussive emesis. No fever, chills, sick contacts. Concern that this may be cause by patients lisinopril  - Discontinuing patients lisinopril   - nicotine patches prescribed  - Tessalon pearls prescribed - Neurontin refilled  - strict return precautions given       Gifford Shave, MD [facility]

## 2019-10-14 NOTE — Assessment & Plan Note (Addendum)
Chronic cough for approximately 6 weeks. Has had one course of antibiotics and once course of steroids with minor improvement with each. Patient has been taking mucinex daily and cough drops. Clear frothy sputum with post tussive emesis. No fever, chills, sick contacts. Concern that this may be cause by patients lisinopril  - Discontinuing patients lisinopril   - nicotine patches prescribed  - Tessalon pearls prescribed - Neurontin refilled  - strict return precautions given

## 2019-11-01 ENCOUNTER — Telehealth: Payer: Self-pay

## 2019-11-01 NOTE — Telephone Encounter (Signed)
Salome Arnt, PA with [facility], LVM on nurse line reporting an A1c of 10.0 yesterday during home visit with patient.   Patient was contacted and has been scheduled with PCP for DM management.

## 2019-11-07 ENCOUNTER — Encounter: Payer: Self-pay | Admitting: Family Medicine

## 2019-11-07 ENCOUNTER — Ambulatory Visit (INDEPENDENT_AMBULATORY_CARE_PROVIDER_SITE_OTHER): Payer: Medicare Other | Admitting: Family Medicine

## 2019-11-07 ENCOUNTER — Other Ambulatory Visit: Payer: Self-pay

## 2019-11-07 VITALS — BP 130/80 | HR 72 | Ht 63.0 in | Wt 206.1 lb

## 2019-11-07 DIAGNOSIS — E1165 Type 2 diabetes mellitus with hyperglycemia: Secondary | ICD-10-CM

## 2019-11-07 DIAGNOSIS — D751 Secondary polycythemia: Secondary | ICD-10-CM

## 2019-11-07 DIAGNOSIS — Z1231 Encounter for screening mammogram for malignant neoplasm of breast: Secondary | ICD-10-CM | POA: Diagnosis not present

## 2019-11-07 DIAGNOSIS — K581 Irritable bowel syndrome with constipation: Secondary | ICD-10-CM

## 2019-11-07 DIAGNOSIS — Z0000 Encounter for general adult medical examination without abnormal findings: Secondary | ICD-10-CM

## 2019-11-07 DIAGNOSIS — Z01811 Encounter for preprocedural respiratory examination: Secondary | ICD-10-CM | POA: Insufficient documentation

## 2019-11-07 DIAGNOSIS — I10 Essential (primary) hypertension: Secondary | ICD-10-CM

## 2019-11-07 DIAGNOSIS — F331 Major depressive disorder, recurrent, moderate: Secondary | ICD-10-CM

## 2019-11-07 DIAGNOSIS — N1831 Chronic kidney disease, stage 3a: Secondary | ICD-10-CM

## 2019-11-07 DIAGNOSIS — M5412 Radiculopathy, cervical region: Secondary | ICD-10-CM

## 2019-11-07 DIAGNOSIS — F17200 Nicotine dependence, unspecified, uncomplicated: Secondary | ICD-10-CM

## 2019-11-07 LAB — POCT GLYCOSYLATED HEMOGLOBIN (HGB A1C): HbA1c, POC (controlled diabetic range): 11 % — AB (ref 0.0–7.0)

## 2019-11-07 MED ORDER — NICOTINE 21 MG/24HR TD PT24: 21.0000 mg | MEDICATED_PATCH | Freq: Every day | TRANSDERMAL | 0 refills | Status: DC

## 2019-11-07 MED ORDER — EMPAGLIFLOZIN 10 MG PO TABS: 10.0000 mg | ORAL_TABLET | Freq: Every day | ORAL | 3 refills | Status: DC

## 2019-11-07 MED ORDER — LINACLOTIDE 290 MCG PO CAPS: 290.0000 ug | ORAL_CAPSULE | Freq: Every day | ORAL | 3 refills | Status: DC

## 2019-11-07 MED FILL — NICOTINE 21 MG/24HR PATCH: 21 | 14 days supply | Qty: 14 | Fill #0

## 2019-11-07 NOTE — Patient Instructions (Addendum)
It was a pleasure to meet you today!  1. For your neck pain, I will check your kidney function and let you know the next steps tomorrow. Expect a phone call.  2. Jardiance- a diabetes medication- will come to your home. Take one a day. Let us know if you have any problems.  3. Nicotine patches- I have sent over a form to help pay for them to the pharmacy. You can pick them up tomorrow afternoon.  4. Your hemoglobin A1c was 11%, likely due to not having the jardiance (see above). I expect your A1c will get better with taking this medication. I also recommend decreasing processed flour and sugar and increasing vegetables and fruit.  5. Go see your eye doctor in the next month or so  6. COVID-19 vaccine: follow up on the date listed on the blue card for the second shot. Most people feel fatigue, arm pain, and maybe some redness or swelling at the site of injection. You may also have a little fever. If any of this happens, you may use some tylenol. Please let us know if you have other symptoms.  7. Mammogram- please call to schedule this and get it done within the next month or so.  8. Follow up: let's see each other again in 1 month, call to schedule next week for early October  9. Therapy resources are listed below.  Be well!  Dr. Chauncey Reading   Therapy and Counseling Resources Most providers on this list will take Medicaid. Patients with commercial insurance or Medicare should contact their insurance company to get a list of in network providers.  [facility]  [address] [place], [place] [address]      [facility] [address]  [place], [place] [address] [phone]  [place] [address] [place]  [place], [place] [address]       [phone]      Jinny Blossom [facility] [address] [place], [place]  [facility]:  [place]. [place]  [phone]  [facility]:  [place] [address] Fate [phone]  [facility] (under & uninsured) [address] [place] [phone]    [email or web address]    [place] [address] . [place]    [phone]  [facility] [place]     Phone:  [phone]     [facility] [place]  [place] [address]      [place], [place] ext [place]: [place], [place], [place]   Manuel Garcia ([place] therapist) [address] [place] [address]   [place] [address]    [phone]    [facility]   [place],  [place], [place]   [place]  [facility]  [address] [place], [place]        [phone]  Open Access/Walk In Clinic under & uninsured  [facility]  [address] [place], [place] Crisis [phone]  [facility] [place],  ([place])   [place], [place]: [phone]) 8:30 - 12; 1 - 2:30  [facility]  [facility] the [place],  [address], [place]    ([phone]):8:30 - 12; 2 - [place],  [address]  [place]; [phone]):   Mon - Fri 8 AM - 5 PM  [facility] [place]  MWF 12:30 to 3:00 or call to schedule an appointment  [phone]  Specific Provider options Psychology Today  [email or web address]/us 1. click on find a therapist  2. enter your zip code 3. left side and select or tailor a therapist for your specific need.   [facility] Provider Directory [email or web address]  (Medicaid)   Follow all drop down to find a  provider  [place] [phone] or [email or web address] [address], [place], [place] Recovery support and educational   24- Hour Availability:  .  [facility]  . [place], [place] Crisis [phone]  . [facility] of the [employer] [phone]  [facility] Crisis Service  [phone]   . [place]  [phone] (after hours)  . Therapeutic Alternative/Mobile Crisis   [phone]  . [place] National Suicide Hotline  (317)531-7676 ([place])  . Call 911 or go to emergency room  . [employer]  [phone]);  [place] and [employer]   . [facility]  [phone]); [place], [place], [place], [place], [place], [place], [place]

## 2019-11-07 NOTE — Assessment & Plan Note (Signed)
Last CBC w/ hgb 16. Likely due to smoking, h/o cancer. Will recheck CBC today to assess.

## 2019-11-07 NOTE — Assessment & Plan Note (Signed)
Patient on cymbalta, PHQ-9 about the same, 17. No SI/HI. Interested in therapy, will add this by giving resources. Patient to contact [place] for therapy. Follow up in 1 month.

## 2019-11-07 NOTE — Assessment & Plan Note (Signed)
Cessation counseling provided today. Nicotine patches refilled and faxed. 1-800-QUIT-LINE given again. F/u in 1 month.

## 2019-11-07 NOTE — Assessment & Plan Note (Signed)
Obtaining CMP today to assess scr. Elevated to 1.6, bl is 1.2.

## 2019-11-07 NOTE — Assessment & Plan Note (Signed)
Uncontrolled. Checking CMP today, will restart jardiance if scr is back to baseline. Otherwise will start dipagliflozin. Current Regimen: lantus 20U qAM, metformin 1000mg  BID CBGs: reports fasting 160-170 Last A1c: 11% today Denies polyuria, polydipsia, hypoglycemia  Last Eye Exam: >1 year ago, recommended she schedule an appointment this month Statin: lipitor ACE/ARB: none due to cough with lisinopril, will consider adding an ARB at next visit

## 2019-11-07 NOTE — Assessment & Plan Note (Signed)
Screening mammogram ordered Reminded to go for ophthalmology exam COVID-19 vaccine pfizer first shot administered

## 2019-11-07 NOTE — Assessment & Plan Note (Signed)
Controlled today, <130/90 - Medications: amlodipine 10mg ,  - Compliance: good - Checking BP at home: yes - Denies any SOB, CP, vision changes, LE edema, medication SEs, or symptoms of hypotension - Diet: standard american diet - Exercise: no
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
## 2019-11-08 ENCOUNTER — Other Ambulatory Visit: Payer: Medicare Other

## 2019-11-13 ENCOUNTER — Telehealth: Payer: Self-pay

## 2019-11-13 NOTE — Telephone Encounter (Signed)
Patient calls nurse line requesting to speak with PCP in regards to medications. Patient reports she would like to increase her Cymbalta and Gabapentin if appropriate. Patient reports the pain she has in her hands is not controlled with Gabapentin 270mg  TID. Patient would like to speak with PCP about increasing Cymbalta. Patient reports she is taking (1) 60mg  capsule daily. Please advise.

## 2019-11-14 NOTE — Telephone Encounter (Signed)
Must assess kidney function prior to medication changes. I had ordered appropriate labs on day of visit 8/26, unfortunately labs were not able to be drawn that day as patient was not able to give blood due to difficult stick. Patient was to return the following day and have labs drawn. So far, has not returned for labs. In order to address concerns re: increasing gabapentin, recommend patient schedule lab appointment (appropriate labs already ordered as future), and then can address once I have the information. She should also have a follow up scheduled for end of September or early October.   Gladys Damme, MD [place] [facility], PGY-2

## 2019-11-26 ENCOUNTER — Other Ambulatory Visit: Payer: Self-pay

## 2019-11-26 MED ORDER — DULOXETINE HCL 60 MG PO CPEP
ORAL_CAPSULE | ORAL | 1 refills | Status: DC
Start: 2019-11-26 — End: 2020-06-08

## 2019-11-28 ENCOUNTER — Ambulatory Visit: Payer: Medicare Other

## 2019-12-26 ENCOUNTER — Other Ambulatory Visit: Payer: Self-pay | Admitting: Student

## 2019-12-26 ENCOUNTER — Telehealth: Payer: Self-pay

## 2019-12-26 DIAGNOSIS — M5412 Radiculopathy, cervical region: Secondary | ICD-10-CM

## 2019-12-26 DIAGNOSIS — M5416 Radiculopathy, lumbar region: Secondary | ICD-10-CM

## 2019-12-26 NOTE — Telephone Encounter (Signed)
Attempted to reach pt to inform that she would need an appt before any addition refills can be made. Pt had appt on 08/28/2019 that she No Showed to. I LVM for pt to call the office to make that appt. Salvatore Marvel, CMA

## 2019-12-26 NOTE — Telephone Encounter (Signed)
Patient returned my call for me to review her medications and drug allergies prior to her being scheduled for a cervical myelogram.  Patient states an understanding she will be here two hours, will need a driver, will need to be on strict bedrest (explained) for 24 hours after the procedure and will need to hold Duloxetine for 48 hours before, and 24 hours after, the myelogram.

## 2019-12-31 NOTE — Telephone Encounter (Signed)
Attempted to reach pt for a 2nd time to inform her that an appt would need to be made before any additional refill can be sent to the pharmacy. No answer. LVM for pt to call the office. Salvatore Marvel, CMA

## 2020-01-09 NOTE — Discharge Instructions (Signed)
Myelogram Discharge Instructions  1. Go home and rest quietly for the next 24 hours.  It is important to lie flat for the next 24 hours.  Get up only to go to the restroom.  You may lie in the bed or on a couch on your back, your stomach, your left side or your right side.  You may have one pillow under your head.  You may have pillows between your knees while you are on your side or under your knees while you are on your back.  2. DO NOT drive today.  Recline the seat as far back as it will go, while still wearing your seat belt, on the way home.  3. You may get up to go to the bathroom as needed.  You may sit up for 10 minutes to eat.  You may resume your normal diet and medications unless otherwise indicated.  Drink lots of extra fluids today and tomorrow.  4. The incidence of headache, nausea, or vomiting is about 5% (one in 20 patients).  If you develop a headache, lie flat and drink plenty of fluids until the headache goes away.  Caffeinated beverages may be helpful.  If you develop severe nausea and vomiting or a headache that does not go away with flat bed rest, call [phone].  5. You may resume normal activities after your 24 hours of bed rest is over; however, do not exert yourself strongly or do any heavy lifting tomorrow. If when you get up you have a headache when standing, go back to bed and force fluids for another 24 hours.  6. Call your physician for a follow-up appointment.  The results of your myelogram will be sent directly to your physician by the following day.  7. If you have any questions or if complications develop after you arrive home, please call [phone].  Discharge instructions have been explained to the patient.  The patient, or the person responsible for the patient, fully understands these instructions   YOU MAY [place] 01/11/20 AT 930 AM

## 2020-01-10 ENCOUNTER — Ambulatory Visit
Admission: RE | Admit: 2020-01-10 | Discharge: 2020-01-10 | Disposition: A | Discharge status: Home / Self Care | Payer: Medicare Other | Source: Ambulatory Visit | Attending: Student | Admitting: Student

## 2020-01-10 ENCOUNTER — Other Ambulatory Visit: Payer: Self-pay | Admitting: Student

## 2020-01-10 DIAGNOSIS — M5412 Radiculopathy, cervical region: Secondary | ICD-10-CM

## 2020-01-10 DIAGNOSIS — M5416 Radiculopathy, lumbar region: Secondary | ICD-10-CM

## 2020-01-10 MED ORDER — IOPAMIDOL (ISOVUE-M 300) INJECTION 61%: 10.0000 mL | Freq: Once | INTRAMUSCULAR | Status: DC | PRN

## 2020-01-10 MED ORDER — DIAZEPAM 5 MG PO TABS
10.0000 mg | ORAL_TABLET | Freq: Once | ORAL | Status: AC
  Administered 2020-01-10: 10 mg via ORAL

## 2020-01-10 MED ORDER — ONDANSETRON HCL 4 MG/2ML IJ SOLN: 4.0000 mg | Freq: Four times a day (QID) | INTRAMUSCULAR | Status: DC | PRN

## 2020-01-10 MED ORDER — IOPAMIDOL (ISOVUE-M 200) INJECTION 41%: 15.0000 mL | Freq: Once | INTRAMUSCULAR | Status: DC

## 2020-01-10 NOTE — Progress Notes (Signed)
Pt reports she has been off of Cymbalta for at least 48 hours.

## 2020-01-29 ENCOUNTER — Ambulatory Visit (INDEPENDENT_AMBULATORY_CARE_PROVIDER_SITE_OTHER): Payer: Medicare Other | Admitting: Family Medicine

## 2020-01-29 ENCOUNTER — Encounter: Payer: Self-pay | Admitting: Family Medicine

## 2020-01-29 ENCOUNTER — Other Ambulatory Visit: Payer: Self-pay

## 2020-01-29 VITALS — BP 128/76 | HR 77 | Ht 63.0 in | Wt 210.2 lb

## 2020-01-29 DIAGNOSIS — M5416 Radiculopathy, lumbar region: Secondary | ICD-10-CM

## 2020-01-29 DIAGNOSIS — E7800 Pure hypercholesterolemia, unspecified: Secondary | ICD-10-CM

## 2020-01-29 DIAGNOSIS — D45 Polycythemia vera: Secondary | ICD-10-CM

## 2020-01-29 DIAGNOSIS — Z23 Encounter for immunization: Secondary | ICD-10-CM | POA: Diagnosis not present

## 2020-01-29 DIAGNOSIS — I10 Essential (primary) hypertension: Secondary | ICD-10-CM

## 2020-01-29 DIAGNOSIS — E1165 Type 2 diabetes mellitus with hyperglycemia: Secondary | ICD-10-CM

## 2020-01-29 DIAGNOSIS — Z0000 Encounter for general adult medical examination without abnormal findings: Secondary | ICD-10-CM

## 2020-01-29 DIAGNOSIS — E785 Hyperlipidemia, unspecified: Secondary | ICD-10-CM

## 2020-01-29 DIAGNOSIS — D751 Secondary polycythemia: Secondary | ICD-10-CM

## 2020-01-29 LAB — POCT GLYCOSYLATED HEMOGLOBIN (HGB A1C): Hemoglobin A1C: 10.4 % — AB (ref 4.0–5.6)

## 2020-01-29 MED ORDER — EMPAGLIFLOZIN 10 MG PO TABS: 10.0000 mg | ORAL_TABLET | Freq: Every day | ORAL | 3 refills | Status: DC

## 2020-01-29 MED ORDER — CARVEDILOL 25 MG PO TABS: 25.0000 mg | ORAL_TABLET | Freq: Two times a day (BID) | ORAL | 1 refills | Status: DC

## 2020-01-29 MED ORDER — METFORMIN HCL 1000 MG PO TABS: 1000.0000 mg | ORAL_TABLET | Freq: Two times a day (BID) | ORAL | 3 refills | Status: DC

## 2020-01-29 NOTE — Patient Instructions (Signed)
It was a pleasure to see you today! We discussed many things...  1. Diabetes Your diabetes is not controlled Your goal is to have an A1c < 8 Medicine Changes: start taking jardiance once a day Homework: check fasting blood sugar Call us if: you have urgency or increased need to pee or pain with peeing  2. High Blood Pressure Your BP is controlled! Your goal is to have a BP average < 130/90 Medicine Changes: none Homework: keep taking amlodipine Call us if: headaches, problems with speech or movement  Come back to see [place] in: 4 weeks  3. I sent a referral to physical therapy, they will call you within 2 weeks to schedule an appointment.   Be Well!  Dr. Chauncey Reading

## 2020-01-29 NOTE — Assessment & Plan Note (Signed)
Hypertension: well controlled today, likely recent elevation in October due to back pain  - Medications: amlodipine 10 mg - Compliance: good  - Checking BP at home: not regularly - Denies any SOB, CP, vision changes, LE edema, medication SEs, or symptoms of hypotension - Diet: SAD - Exercise: none- discussed importance

## 2020-01-29 NOTE — Assessment & Plan Note (Addendum)
On high intensity statin: atorva 80 mg. Will check lipid panel and CMP today to evaluate LFTs.

## 2020-01-29 NOTE — Progress Notes (Signed)
SUBJECTIVE:   CHIEF COMPLAINT / HPI: f/u DM  Diabetic Follow Up: Patient is a 54 y.o. female who present today for diabetic follow up.   Patient endorses difficulties with exercise since last A1C  Home medications include: metformin 1,000mg  BID and lantus 10U Patient endorses taking these medications as prescribed.  Most recent A1Cs:  Lab Results  Component Value Date   HGBA1C 10.4 (A) 01/29/2020   HGBA1C 11.0 (A) 11/07/2019   HGBA1C 11.5 (A) 02/06/2019   Last Microalbumin, LDL, Creatinine: Lab Results  Component Value Date   MICROALBUR 150 03/04/2019   LDLCALC 93 02/06/2019   CREATININE 1.66 (H) 09/03/2019   Patient does check blood glucose on a regular basis. 150s-170s post prandial, tends to be 100s fasting.  Patient is not up to date on diabetic eye. Patient is up to date on diabetic foot exam.  HTN: Does not regularly check BP at home, but BP is improved today to 128/76. Denies HA, leg swelling, vision and movement changes:  HLD: patient takes atorvastatin 80 mg, compliant. Will check lipid panel today.  Polycythemia vera: very mild, last check hgb 16, most likely due to smoking. Will check CBC today.  Lumbar radiculopathy: Patient reports that she has had increased pain in her left lumbar spin and SI joint. She denies saddle anesthesia and incontinence. She has seen a neurosurgeon across the street, Dr. Ronnald Ramp. She is having a myelogram soon. He has recommended surgery and she would like to do this. She reports that she has had increased problems walking due to pain and thinks she needs a wheel chair. She has done PT in the past, but not recently. We discussed benefits of PT to get stronger with the pain in order to be better able to recover from surgery. Also would like PT assessment for wheelchair. Patient agreeable to this, but afraid that she cannot complete PT due to pain. We discussed the importance of not becoming too deconditioned and that the only way to  recover from surgery is with physical therapy, not moving will increase likelihood of complication and not achieving desired outcomes.  PERTINENT  PMH / PSH: DMT2, MDD, Cervical pain, HTN, HLD  OBJECTIVE:   BP 128/76   Pulse 77   Ht 5\' 3"  (1.6 m)   Wt 210 lb 3.2 oz (95.3 kg)   SpO2 98%   BMI 37.24 kg/m   HEENT: Sclera without injection or icterus. MMM.  Neck: Supple.  Cardiac: Regular rate and rhythm. Normal S1/S2. No murmurs, rubs, or gallops appreciated. Lungs: Clear bilaterally to ascultation.  Neuro: AOx3, at baseline MSK: Lumbar spine: - Inspection: no gross deformity or asymmetry, swelling or ecchymosis. No skin changes - Palpation: No TTP over the spinous processes. Mild TTP over left paraspinal muscles and SI joint - ROM: decreased ROM due to pain, patient hesitant to flex and extend - Strength: 5/5 strength of lower extremity in L4-S1 nerve root distributions b/l - Neuro: sensation intact in the L4-S1 nerve root distribution b/l, 2+ L4 and S1 reflexes Psych: Pleasant and appropriate   ASSESSMENT/PLAN:   Diabetes mellitus type 2, uncontrolled Uncontrolled. Hgb A1c improved with increased lantus of 10U to 10.4% from 11%. Will add in jardiance, counseled on SEs. Will obtain urine microalbumin today. Current Regimen: metformin 1000mg  BID, lantus 10U qAM CBGs: reports 150-170s postprandial, 100s fasting  Last A1c: 10.4% on today  Denies polyuria, polydipsia, hypoglycemia  Last Eye Exam: reminded to see this year Statin: atorvastatin 80 mg ACE/ARB: presently  not on one, due to cough with lisinopril. Will consider ARB, awaiting BMP. Patient did not show up to previous follow up appointment.    Hypercholesterolemia On high intensity statin: atorva 80 mg. Will check lipid panel and CMP today to evaluate LFTs.  Essential hypertension Hypertension: well controlled today, likely recent elevation in October due to back pain  - Medications: amlodipine 10 mg - Compliance: good   - Checking BP at home: not regularly - Denies any SOB, CP, vision changes, LE edema, medication SEs, or symptoms of hypotension - Diet: SAD - Exercise: none- discussed importance   Polycythemia, secondary Most likely due to smoking history. Will recheck CBC today to assess. Patient was supposed to have lab work done on 11/07/19, but they were unable to obtain blood successfully and wanted her to return the next day for blood work. Patient did not keep follow up appointment.  Chronic left-sided lumbar radiculopathy Patient following with Dr. Ronnald Ramp, neurosurgery. Will refer patient to physical therapy for assessment for assistive mobility device (wheel chair), also for back pain and gait/strengthening. No red flags for saddle anesthesia or incontinence.  Healthcare maintenance 2nd COVID-19 shot today. Counseled on flu vaccine, patient declines. Reminded re: mammogram and ophthalmology     Gladys Damme, MD [place]

## 2020-01-29 NOTE — Assessment & Plan Note (Signed)
2nd COVID-19 shot today. Counseled on flu vaccine, patient declines. Reminded re: mammogram and ophthalmology

## 2020-01-29 NOTE — Assessment & Plan Note (Addendum)
Uncontrolled. Hgb A1c improved with increased lantus of 10U to 10.4% from 11%. Will add in jardiance, counseled on SEs. Will obtain urine microalbumin today. Current Regimen: metformin 1000mg  BID, lantus 10U qAM CBGs: reports 150-170s postprandial, 100s fasting  Last A1c: 10.4% on today  Denies polyuria, polydipsia, hypoglycemia  Last Eye Exam: reminded to see this year Statin: atorvastatin 80 mg ACE/ARB: presently not on one, due to cough with lisinopril. Will consider ARB, awaiting BMP. Patient did not show up to previous follow up appointment.

## 2020-01-29 NOTE — Assessment & Plan Note (Signed)
Most likely due to smoking history. Will recheck CBC today to assess. Patient was supposed to have lab work done on 11/07/19, but they were unable to obtain blood successfully and wanted her to return the next day for blood work. Patient did not keep follow up appointment.

## 2020-01-29 NOTE — Assessment & Plan Note (Signed)
Patient following with Dr. Ronnald Ramp, neurosurgery. Will refer patient to physical therapy for assessment for assistive mobility device (wheel chair), also for back pain and gait/strengthening. No red flags for saddle anesthesia or incontinence.

## 2020-01-30 ENCOUNTER — Other Ambulatory Visit: Payer: Self-pay | Admitting: Neurological Surgery

## 2020-01-30 DIAGNOSIS — M533 Sacrococcygeal disorders, not elsewhere classified: Secondary | ICD-10-CM

## 2020-01-30 LAB — COMPREHENSIVE METABOLIC PANEL
ALT: 18 IU/L (ref 0–32)
AST: 17 IU/L (ref 0–40)
Albumin/Globulin Ratio: 1.8 (ref 1.2–2.2)
Albumin: 4.8 g/dL (ref 3.8–4.9)
Alkaline Phosphatase: 72 IU/L (ref 44–121)
BUN/Creatinine Ratio: 9 (ref 9–23)
BUN: 13 mg/dL (ref 6–24)
Bilirubin Total: 0.6 mg/dL (ref 0.0–1.2)
CO2: 19 mmol/L — ABNORMAL LOW (ref 20–29)
Calcium: 9.8 mg/dL (ref 8.7–10.2)
Chloride: 101 mmol/L (ref 96–106)
Creatinine, Ser: 1.38 mg/dL — ABNORMAL HIGH (ref 0.57–1.00)
GFR calc Af Amer: 49 mL/min/{1.73_m2} — ABNORMAL LOW (ref >59)
GFR calc non Af Amer: 43 mL/min/{1.73_m2} — ABNORMAL LOW (ref >59)
Globulin, Total: 2.6 g/dL (ref 1.5–4.5)
Glucose: 178 mg/dL — ABNORMAL HIGH (ref 65–99)
Potassium: 3.7 mmol/L (ref 3.5–5.2)
Sodium: 141 mmol/L (ref 134–144)
Total Protein: 7.4 g/dL (ref 6.0–8.5)

## 2020-01-30 LAB — MICROALBUMIN / CREATININE URINE RATIO
Creatinine, Urine: 234.4 mg/dL
Microalb/Creat Ratio: 193 mg/g creat — ABNORMAL HIGH (ref 0–29)
Microalbumin, Urine: 451.6 ug/mL

## 2020-01-30 LAB — CBC WITH DIFFERENTIAL/PLATELET
Basophils Absolute: 0 10*3/uL (ref 0.0–0.2)
Basos: 0 %
EOS (ABSOLUTE): 0.2 10*3/uL (ref 0.0–0.4)
Eos: 3 %
Hematocrit: 48 % — ABNORMAL HIGH (ref 34.0–46.6)
Hemoglobin: 15.9 g/dL (ref 11.1–15.9)
Immature Grans (Abs): 0 10*3/uL (ref 0.0–0.1)
Immature Granulocytes: 0 %
Lymphocytes Absolute: 1.8 10*3/uL (ref 0.7–3.1)
Lymphs: 22 %
MCH: 30.2 pg (ref 26.6–33.0)
MCHC: 33.1 g/dL (ref 31.5–35.7)
MCV: 91 fL (ref 79–97)
Monocytes Absolute: 0.6 10*3/uL (ref 0.1–0.9)
Monocytes: 8 %
Neutrophils Absolute: 5.4 10*3/uL (ref 1.4–7.0)
Neutrophils: 67 %
Platelets: 229 10*3/uL (ref 150–450)
RBC: 5.27 x10E6/uL (ref 3.77–5.28)
RDW: 13.3 % (ref 11.7–15.4)
WBC: 8.1 10*3/uL (ref 3.4–10.8)

## 2020-01-30 LAB — LIPID PANEL
Chol/HDL Ratio: 3 ratio (ref 0.0–4.4)
Cholesterol, Total: 123 mg/dL (ref 100–199)
HDL: 41 mg/dL (ref >39)
LDL Chol Calc (NIH): 52 mg/dL (ref 0–99)
Triglycerides: 183 mg/dL — ABNORMAL HIGH (ref 0–149)
VLDL Cholesterol Cal: 30 mg/dL (ref 5–40)

## 2020-02-17 ENCOUNTER — Telehealth: Payer: Self-pay | Admitting: Family Medicine

## 2020-02-17 NOTE — Telephone Encounter (Signed)
Lipid panel looks well controlled with statin, no change to this medication.  Started Jardiance at last visit, however patient reports that she was unable to afford this medication.  Due to elevated protein in urine on urine microalbumin as well as EGFR in the 40s, recommend patient be on SGLT2i as well as ACE or ARB.  She previously was unable to tolerate lisinopril.  Will try ARB at her next visit next week.  We will also refer to nephrology at that time.  In the meantime I will discuss with his obtaining more affordable SGLT2i with pharmacy team.  Theresa Damme, MD [facility], PGY-2

## 2020-02-20 ENCOUNTER — Ambulatory Visit
Admission: RE | Admit: 2020-02-20 | Discharge: 2020-02-20 | Disposition: A | Discharge status: Home / Self Care | Payer: Medicare Other | Source: Ambulatory Visit | Attending: Neurological Surgery | Admitting: Neurological Surgery

## 2020-02-20 ENCOUNTER — Other Ambulatory Visit: Payer: Self-pay

## 2020-02-20 DIAGNOSIS — M533 Sacrococcygeal disorders, not elsewhere classified: Secondary | ICD-10-CM

## 2020-02-21 ENCOUNTER — Other Ambulatory Visit: Payer: Self-pay

## 2020-02-22 ENCOUNTER — Telehealth: Payer: Self-pay | Admitting: Family Medicine

## 2020-02-22 NOTE — Telephone Encounter (Signed)
Reminded patient to hold metformin with eGFR at 43. Will recheck lab work at visit on 12/16. Discussed that she can increase her lantus to 25U or 30U in the morning depending on BG (<200).  Caitlin Mahoney, MD [facility], PGY-2  

## 2020-02-27 ENCOUNTER — Encounter: Payer: Self-pay | Admitting: Family Medicine

## 2020-02-27 ENCOUNTER — Other Ambulatory Visit: Payer: Self-pay

## 2020-02-27 ENCOUNTER — Ambulatory Visit: Payer: Medicare Other | Admitting: Family Medicine

## 2020-02-27 ENCOUNTER — Telehealth: Payer: Self-pay

## 2020-02-27 VITALS — BP 134/72 | HR 74 | Wt 210.8 lb

## 2020-02-27 DIAGNOSIS — E7800 Pure hypercholesterolemia, unspecified: Secondary | ICD-10-CM

## 2020-02-27 DIAGNOSIS — E1165 Type 2 diabetes mellitus with hyperglycemia: Secondary | ICD-10-CM

## 2020-02-27 DIAGNOSIS — M5416 Radiculopathy, lumbar region: Secondary | ICD-10-CM

## 2020-02-27 DIAGNOSIS — N1832 Chronic kidney disease, stage 3b: Secondary | ICD-10-CM

## 2020-02-27 MED ORDER — EMPAGLIFLOZIN 10 MG PO TABS: 10.0000 mg | ORAL_TABLET | Freq: Every day | ORAL | 3 refills | Status: DC

## 2020-02-27 NOTE — Assessment & Plan Note (Signed)
LDL appropriate at 52 on high intensity statin at atorvastatin 80 mg. Continue current therapy.

## 2020-02-27 NOTE — Progress Notes (Signed)
SUBJECTIVE:   CHIEF COMPLAINT / HPI: f/u  Lumbar radiculopathy: patient is in a lot of pain today, she has had worsening symptoms of radiculopathy down the back of both legs. She also reports occasional numbness and saddle anesthesia on and off for the last 2 months. She has a h/o spinal surgery with Dr. Ronnald Ramp, neurosurgeon, last intervention ~6 years ago. She saw him last Thursday and had an injection of SI joint under fluoroscopy. Patient had great improvement with lidocaine injection, but has been in pain ever since lidocaine wore off. She has pain bilaterally, but R was worse than left, now Left is worse than Right (perhaps small improvement with SI injection?). Patient is able to ambulate slowly with a cane, and is using her stimulator for pain. Due to diminished renal function, she cannot use NSAIDs. She has taken 2 extra strength tylenol about 30 minutes ago without relief.  Diabetic Follow Up: Patient is a 53 y.o. female who present today for diabetic follow up.   Patient endorses difficulties affording medications  Home medications include: metformin (on hold for renal function), lantus 20 U qAM and 30U qPM, attempting to get jardiance covered Patient endorses taking insulinmedications as prescribed.  Most recent A1Cs:  Lab Results  Component Value Date   HGBA1C 10.4 (A) 01/29/2020   HGBA1C 11.0 (A) 11/07/2019   HGBA1C 11.5 (A) 02/06/2019   Last Microalbumin, LDL, Creatinine: Lab Results  Component Value Date   MICROALBUR 150 03/04/2019   LDLCALC 52 01/29/2020   CREATININE 1.38 (H) 01/29/2020   Patient does check blood glucose on a regular basis. Since increase in lantus, patient has been ranging between 130-150 fasting and eating.  Patient is up to date on diabetic eye. Patient is up to date on diabetic foot exam.  CKD: Last BMP showed eGFR ~43. Metformin on hold, will refer to nephrology.  HLD: Patient is compliant with high intensity statin, no SEs, last lipid  panel checked in November shows good compliance and control with LDL at 52.  PERTINENT  PMH / PSH: DMT2, TN, HLD, Lumbar radiculopathy  OBJECTIVE:   BP 134/72   Pulse 74   Wt 210 lb 12.8 oz (95.6 kg)   SpO2 96%   BMI 37.34 kg/m   Nursing note and vitals reviewed GEN: uncomfortable appearing AAF, NAD, obese HEENT: NCAT. PERRLA. Sclera without injection or icterus. MMM.   Cardiac: Regular rate and rhythm. Normal S1/S2. No murmurs, rubs, or gallops appreciated. 2+ radial pulses. Lungs: Clear bilaterally to ascultation. No increased WOB, no accessory muscle usage. No w/r/r. Neuro: AOx3, patellar reflexes not present bilaterally, bicep reflexes are present bilaterally, gait is stiff and painful Back: Mild tenderness to palpation along central column and bilaterally over SI joints, no stepoffs. Limited exam due to pain. Ext: no edema Psych: Pleasant and appropriate   ASSESSMENT/PLAN:   Diabetes mellitus type 2, uncontrolled No jardiance samples available today, discussed with Cheryle Horsfall, pharmacy tech. She will obtain application for Jardiance, in mean time, patient should have co pay of $47, which she can do this month. Will try to get SGLT2i covered. If unable to do so, will add GLP-1. Patient has improvement in BG with increased insulin. Metformin held due to eGFR<45. Will refer to nephrology and get input about ARB. F/u 03/20/20 with me.  Hypercholesterolemia LDL appropriate at 52 on high intensity statin at atorvastatin 80 mg. Continue current therapy.  Chronic kidney disease (CKD), stage III (moderate) Patient has eGFR<45, will refer to nephrology.  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 Attempting to get patient started on SGLT2i.   Chronic lumbar radiculopathy Patient has worsening and progressive symptoms of bilateral radiculopathy with decreased reflexes in b/l patellae. Due to kidney function, recommend patient treat with tylenol, heat, and lidocaine/salonpas patches. Also  recommend that patient call neurosurgery office and schedule follow up appointment ideally for this week or next. Patient presently has follow up appt on 03/16/20. Likely cannot do MRI due to hardware in spine.      Gladys Damme, MD [place]

## 2020-02-27 NOTE — Assessment & Plan Note (Signed)
Patient has worsening and progressive symptoms of bilateral radiculopathy with decreased reflexes in b/l patellae. Due to kidney function, recommend patient treat with tylenol, heat, and lidocaine/salonpas patches. Also recommend that patient call neurosurgery office and schedule follow up appointment ideally for this week or next. Patient presently has follow up appt on 03/16/20. Likely cannot do MRI due to hardware in spine.

## 2020-02-27 NOTE — Assessment & Plan Note (Signed)
Patient has eGFR<45, will refer to nephrology. 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 Attempting to get patient started on SGLT2i.

## 2020-02-27 NOTE — Patient Instructions (Addendum)
It was a pleasure to see you today!  1. We will get some labs today.  If they are abnormal or we need to do something about them, I will call you.  If they are normal, I will send you a message on MyChart (if it is active) or a letter in the mail.  If you don't hear from [place] in 2 weeks, please call the office  [phone].  2. For your diabetes: continue the insulin regimen you are on right now. Start taking jardiance once daily and let me know if you have any symptoms of urinary tract infection (pain with urination). Keep up the good work with checking your blood sugar daily.  3. For your Kidneys: I have referred you to a nephrologist (kidney specialist). Expect a phone call in 1-3 weeks (with the holidays it may take longer) if you have not heard in 3 weeks to schedule an appointment, let me know at [phone] and we can help get you scheduled.  4. For your back: please call your neurosurgeon today, you need a follow up sooner than January 3rd. I also recommend lidocaine patches or salonpas for pain over the counter. You should also receive a call to schedule an appointment for physical therapy in the next 1-3 weeks, let me know if you do not get a call  5. Follow up on 02/18/20 at 9:50 AM.    Be Well,  Dr. Chauncey Reading

## 2020-02-27 NOTE — Assessment & Plan Note (Addendum)
No jardiance samples available today, discussed with Cheryle Horsfall, pharmacy tech. She will obtain application for Jardiance, in mean time, patient should have co pay of $47, which she can do this month. Will try to get SGLT2i covered. If unable to do so, will add GLP-1. Patient has improvement in BG with increased insulin. Metformin held due to eGFR<45. Will refer to nephrology and get input about ARB. F/u 03/20/20 with me.

## 2020-02-28 NOTE — Telephone Encounter (Signed)
Created in error

## 2020-03-05 ENCOUNTER — Telehealth: Payer: Self-pay | Admitting: Family Medicine

## 2020-03-05 DIAGNOSIS — Z9989 Dependence on other enabling machines and devices: Secondary | ICD-10-CM

## 2020-03-05 DIAGNOSIS — G4733 Obstructive sleep apnea (adult) (pediatric): Secondary | ICD-10-CM

## 2020-03-05 NOTE — Telephone Encounter (Signed)
Patients daughter is requesting for her mother to have a new CPAP ordered. She has had the same one for a few years and she has recently found bugs infested in cpap. Daughter was not able to clean machine properly.   Please call daughter Georges Lynch to let her know when this is ordered. The best call back is [phone].

## 2020-03-12 ENCOUNTER — Telehealth: Payer: Self-pay

## 2020-03-12 DIAGNOSIS — B373 Candidiasis of vulva and vagina: Secondary | ICD-10-CM

## 2020-03-12 DIAGNOSIS — B3731 Acute candidiasis of vulva and vagina: Secondary | ICD-10-CM

## 2020-03-12 MED ORDER — TERCONAZOLE 80 MG VA SUPP: 80.0000 mg | Freq: Every day | VAGINAL | 0 refills | Status: DC

## 2020-03-12 NOTE — Telephone Encounter (Signed)
DME ordered and routed.  Gladys Damme, MD [facility], PGY-2

## 2020-03-12 NOTE — Telephone Encounter (Signed)
Patient returns call to nurse line and is reporting vaginal itching and vaginal discharge. Patient is requesting medication for yeast infection be sent to [facility] on [address].   To PCP  Talbot Grumbling, RN

## 2020-03-12 NOTE — Addendum Note (Signed)
Addended by: Bridget Hartshorn on: 03/12/2020 01:54 PM   Modules accepted: Orders

## 2020-03-12 NOTE — Telephone Encounter (Signed)
Terconazole 0.3% suppository x3d prescription sent in for vaginal candidiasis as office will be closed for 3 days due to holiday. Recommend patient follow up if no improvement or if repeated yeast infection. Called patient and left HIPAA compliant VM.  Gladys Damme, MD [facility], PGY-2

## 2020-03-12 NOTE — Telephone Encounter (Signed)
Patient LVM on nurse line requesting medication be sent in for yeast infection since starting jardiance. Attempted to call patient back at provided number [phone]) to gather more information, no answer. Left HIPAA compliant VM for patient to return call to office.   Talbot Grumbling, RN

## 2020-03-17 ENCOUNTER — Other Ambulatory Visit: Payer: Self-pay | Admitting: Family Medicine

## 2020-03-17 DIAGNOSIS — B373 Candidiasis of vulva and vagina: Secondary | ICD-10-CM

## 2020-03-17 DIAGNOSIS — B3731 Acute candidiasis of vulva and vagina: Secondary | ICD-10-CM

## 2020-03-17 MED ORDER — FLUCONAZOLE 150 MG PO TABS: 150.0000 mg | ORAL_TABLET | Freq: Once | ORAL | 0 refills | Status: AC

## 2020-03-17 NOTE — Telephone Encounter (Signed)
Patient calls nurse line stating she has completed the vaginal suppositories for yeast, however she is still having the same symptoms. Patient reports she is very irritated, "I cant even think right now," due to irritation. Patient advised per last note she would need to come in to evaluated. However, she reports her husband currently has Covid and she is being tested today. Patient does have an apt on 03/20/2020 with PCP. Patient advised to cancel if she tests positive. Patient is requesting oral medication for yeast, as recent suppositories cost ~47 dollars. Please advise on treatment.

## 2020-03-17 NOTE — Addendum Note (Signed)
Addended by: Bridget Hartshorn on: 03/17/2020 02:57 PM   Modules accepted: Orders

## 2020-03-17 NOTE — Telephone Encounter (Signed)
I initially chose topical yeast treatment as patient has complex medical history and borderline GFR (last 43). However, patient calls and wishes to have oral diflucan. Performed interaction check with other medications and CrCl 68, appropriate to give diflucan orally. Rx sent in electronically.  Gladys Damme, MD [facility], PGY-2

## 2020-03-20 ENCOUNTER — Ambulatory Visit: Payer: Medicare Other | Admitting: Family Medicine

## 2020-03-24 ENCOUNTER — Encounter (HOSPITAL_COMMUNITY): Payer: Self-pay

## 2020-03-24 ENCOUNTER — Ambulatory Visit (HOSPITAL_COMMUNITY)
Admission: RE | Admit: 2020-03-24 | Discharge: 2020-03-24 | Disposition: A | Discharge status: Home / Self Care | Payer: Medicare Other | Source: Ambulatory Visit | Attending: Internal Medicine | Admitting: Internal Medicine

## 2020-03-24 ENCOUNTER — Other Ambulatory Visit: Payer: Self-pay

## 2020-03-24 DIAGNOSIS — C3490 Malignant neoplasm of unspecified part of unspecified bronchus or lung: Secondary | ICD-10-CM | POA: Insufficient documentation

## 2020-03-26 ENCOUNTER — Other Ambulatory Visit: Payer: Self-pay

## 2020-03-26 ENCOUNTER — Inpatient Hospital Stay: Payer: Medicare Other | Attending: Internal Medicine

## 2020-03-26 DIAGNOSIS — Z85118 Personal history of other malignant neoplasm of bronchus and lung: Secondary | ICD-10-CM | POA: Diagnosis present

## 2020-03-26 DIAGNOSIS — C3490 Malignant neoplasm of unspecified part of unspecified bronchus or lung: Secondary | ICD-10-CM

## 2020-03-26 LAB — CBC WITH DIFFERENTIAL (CANCER CENTER ONLY)
Abs Immature Granulocytes: 0.01 10*3/uL (ref 0.00–0.07)
Basophils Absolute: 0 10*3/uL (ref 0.0–0.1)
Basophils Relative: 1 %
Eosinophils Absolute: 0.3 10*3/uL (ref 0.0–0.5)
Eosinophils Relative: 4 %
HCT: 47.9 % — ABNORMAL HIGH (ref 36.0–46.0)
Hemoglobin: 15.9 g/dL — ABNORMAL HIGH (ref 12.0–15.0)
Immature Granulocytes: 0 %
Lymphocytes Relative: 25 %
Lymphs Abs: 1.9 10*3/uL (ref 0.7–4.0)
MCH: 30.3 pg (ref 26.0–34.0)
MCHC: 33.2 g/dL (ref 30.0–36.0)
MCV: 91.4 fL (ref 80.0–100.0)
Monocytes Absolute: 0.6 10*3/uL (ref 0.1–1.0)
Monocytes Relative: 7 %
Neutro Abs: 5 10*3/uL (ref 1.7–7.7)
Neutrophils Relative %: 63 %
Platelet Count: 199 10*3/uL (ref 150–400)
RBC: 5.24 MIL/uL — ABNORMAL HIGH (ref 3.87–5.11)
RDW: 13.1 % (ref 11.5–15.5)
WBC Count: 7.8 10*3/uL (ref 4.0–10.5)
nRBC: 0 % (ref 0.0–0.2)

## 2020-03-26 LAB — CMP (CANCER CENTER ONLY)
ALT: 15 U/L (ref 0–44)
AST: 11 U/L — ABNORMAL LOW (ref 15–41)
Albumin: 3.9 g/dL (ref 3.5–5.0)
Alkaline Phosphatase: 74 U/L (ref 38–126)
Anion gap: 8 (ref 5–15)
BUN: 11 mg/dL (ref 6–20)
CO2: 26 mmol/L (ref 22–32)
Calcium: 9.1 mg/dL (ref 8.9–10.3)
Chloride: 104 mmol/L (ref 98–111)
Creatinine: 1.5 mg/dL — ABNORMAL HIGH (ref 0.44–1.00)
GFR, Estimated: 41 mL/min — ABNORMAL LOW (ref >60)
Glucose, Bld: 314 mg/dL — ABNORMAL HIGH (ref 70–99)
Potassium: 3.5 mmol/L (ref 3.5–5.1)
Sodium: 138 mmol/L (ref 135–145)
Total Bilirubin: 0.8 mg/dL (ref 0.3–1.2)
Total Protein: 7.3 g/dL (ref 6.5–8.1)

## 2020-03-26 MED ORDER — LANTUS SOLOSTAR 100 UNIT/ML ~~LOC~~ SOPN
PEN_INJECTOR | SUBCUTANEOUS | 10 refills | Status: DC
Start: 2020-03-26 — End: 2020-03-28

## 2020-03-26 NOTE — Progress Notes (Signed)
SUBJECTIVE:   CHIEF COMPLAINT / HPI: f/u DM  Diabetic Follow Up: Patient is a 55 y.o. female who present today for diabetic follow up.  Patient endorses difficulties affording medications, difficulties with medication compliance, difficulties with diet since last A1C and difficulties with exercise since last A1C. Due to vaginal candidiasis, patient self-discontinued jardiance. Her lantus has now expired and patient cannot afford more at this time.  Home medications include: lantus 30U daily, metformin 500 mg qd, jardiance 10 mg qd. Patient endorses taking lantus and metformin as prescribed. Patient does check blood glucose on a regular basis. BG has been elevated, fasting 178 to 250s. Patient is not up to date on diabetic eye. Patient is up to date on diabetic foot exam.  Most recent A1Cs:  Lab Results  Component Value Date   HGBA1C 10.4 (A) 01/29/2020   HGBA1C 11.0 (A) 11/07/2019   HGBA1C 11.5 (A) 02/06/2019   Last Microalbumin, LDL, Creatinine: Lab Results  Component Value Date   MICROALBUR 150 03/04/2019   LDLCALC 52 01/29/2020   CREATININE 1.50 (H) 03/26/2020   HTN: BP at goal today of <130/80. Compliant with medications.  R back pain: patient has long h/o chronic lumbar radiculopathy s/p spinal fusion and spinal cord stimulator, but she notes some right-sided back pain for the last few days that feels different from her usual back pain. Difficult to describe, cramping, sharp pain. Patient has h/o CKD stage IIIb, last eGFR yesterday at 10. Will check labs for urinalysis and urine culture in case of UTI. No fever, no CVA.  OSA: patient's mask no longer functioning. CPAP ordered, in process of [facility] obtaining it. Patient to alert [place] if not received in the next week.  GERD: patient has occasional GERD sx, not present currently. She would like omeprazole, we discussed risks and benefits of long term use of PPI.   PERTINENT  PMH / PSH: DM, H TN, CKD stage IIIb,  neuropathic pain, HLD, morbid obesity, tobacco use, bronchogenic cancer of left lung, COPD, chronic lumbar radiculopathy w/ spinal cord stimulator, s/p spinal fusion.  OBJECTIVE:   BP 118/76   Pulse 72   Ht 5' 2" (1.575 m)   Wt 210 lb (95.3 kg)   SpO2 97%   BMI 38.41 kg/m   Nursing note and vitals reviewed GEN: age-appropriate, AAW, resting comfortably in chair, NAD, obese HEENT: NCAT. Sclera without injection or icterus.  Cardiac: Regular rate and rhythm. Normal S1/S2. No murmurs, rubs, or gallops appreciated. 2+ radial pulses. Lungs: Clear bilaterally to ascultation. No increased WOB, no accessory muscle usage. No w/r/r. Neuro: Alert and oriented Ext: no edema Psych: Pleasant and appropriate  Diabetic Foot Exam - Simple   Simple Foot Form Diabetic Foot exam was performed with the following findings: Yes 03/27/2020  1:45 PM  Visual Inspection No deformities, no ulcerations, no other skin breakdown bilaterally: Yes Sensation Testing See comments: Yes Pulse Check Posterior Tibialis and Dorsalis pulse intact bilaterally: Yes Comments Diminished sensation bilaterally    ASSESSMENT/PLAN:   Diabetes mellitus type 2, uncontrolled Patient counseled extensively about long-term sequelae of uncontrolled DMT2. Pharmacy able to assist with teaching. Patient given supply of lantus for 1 month, trained with ozempic 0.57m and given 1 month supply. Will obtain urine microalbumin to assess proteinuria. Ideally once A1c under better control (</=9), plan to restart jardiance to reduce risk of vaginal candidiasis and achieve cardio and renal protection of SGLT2i. -urine microalbumin -ozempic 0.217mqweekly -lantus 30U qd -metformin 500 mg qd -f/u  1 month  Morbid obesity Starting GLP-1 for DM, hopefully can achieve some weight loss as well.  Essential hypertension Hypertension: - Medications: coreg 25 mg BID, amlodipine 20m qhs - Compliance: good - Checking BP at home: yes - Denies any  SOB, CP, vision changes, LE edema, medication SEs, or symptoms of hypotension - Diet: SAD - Exercise: none Well controlled, <130/80, continue current regimen.   Obstructive sleep apnea Reordered CPAP  Acute right-sided low back pain Patient with h/o chronic lumbar radiculopathy, s/p fusion and new/different right sided back pain, no red flags. C/f UTI. Will obtain UA, urine cx.  GERD (gastroesophageal reflux disease) Patient occasionally has GERD, counseled on pros/cons of prolonged PPI use. No sx of gerd now, counseled on avoiding trigger foods. I recommended H2 blocker, such as famotidine. With shared decision making, patient will try a 1-2 week course of omeprazole in case of flare of GERD symptoms instead of chronic PPI use.      CGladys Damme MD [facility]

## 2020-03-27 ENCOUNTER — Encounter: Payer: Self-pay | Admitting: Family Medicine

## 2020-03-27 ENCOUNTER — Ambulatory Visit (INDEPENDENT_AMBULATORY_CARE_PROVIDER_SITE_OTHER): Payer: Medicare Other | Admitting: Family Medicine

## 2020-03-27 VITALS — BP 118/76 | HR 72 | Ht 62.0 in | Wt 210.0 lb

## 2020-03-27 DIAGNOSIS — N1832 Chronic kidney disease, stage 3b: Secondary | ICD-10-CM | POA: Diagnosis not present

## 2020-03-27 DIAGNOSIS — E1165 Type 2 diabetes mellitus with hyperglycemia: Secondary | ICD-10-CM | POA: Diagnosis not present

## 2020-03-27 DIAGNOSIS — G4733 Obstructive sleep apnea (adult) (pediatric): Secondary | ICD-10-CM

## 2020-03-27 DIAGNOSIS — K219 Gastro-esophageal reflux disease without esophagitis: Secondary | ICD-10-CM

## 2020-03-27 DIAGNOSIS — M545 Low back pain, unspecified: Secondary | ICD-10-CM | POA: Diagnosis not present

## 2020-03-27 DIAGNOSIS — I10 Essential (primary) hypertension: Secondary | ICD-10-CM

## 2020-03-27 LAB — POCT URINALYSIS DIP (MANUAL ENTRY)
Bilirubin, UA: NEGATIVE
Glucose, UA: >=1000 mg/dL — AB
Ketones, POC UA: NEGATIVE mg/dL
Leukocytes, UA: NEGATIVE
Nitrite, UA: NEGATIVE
Protein Ur, POC: 100 mg/dL — AB
Spec Grav, UA: 1.025 (ref 1.010–1.025)
Urobilinogen, UA: 1 E.U./dL
pH, UA: 6 (ref 5.0–8.0)

## 2020-03-27 LAB — POCT UA - MICROSCOPIC ONLY

## 2020-03-27 MED ORDER — OMEPRAZOLE 20 MG PO CPDR: 20.0000 mg | DELAYED_RELEASE_CAPSULE | Freq: Every day | ORAL | 0 refills | Status: DC

## 2020-03-27 NOTE — Telephone Encounter (Signed)
Community message sent to [facility]. Will await response.   Akeylah Hendel C Wyland Rastetter, RN  

## 2020-03-27 NOTE — Progress Notes (Signed)
Asked by Dr. Chauncey Reading to educate patient on semaglutide (Ozempic) therapy. Patient was educated on purpose, proper use, and potential adverse effects including nausea/vomiting. Reviewed hypoglycemia management. After discussion, patient demonstrated understanding of s/sx of hypoglycemia and new medication regimen.   Medication Samples have been provided to the patient.  Drug name: Insulin glargine (Lantus)       Strength: 100 units/ml        Qty: 3  LOT: [id number]  Exp.Date: 04/13/2021  Medication Samples have been provided to the patient.  Drug name: semaglutide (Ozempic)       Strength: 0.25 mg        Qty: 1  LOT: [id number]  Exp.Date: 04/13/2022  Dosing instructions: Ozempic 0.25 mg SQ once weekly  The patient has been instructed regarding the correct time, dose, and frequency of taking this medication, including desired effects and most common side effects.   Claudina Lick, PharmD PGY1 Acute Care Pharmacy Resident 03/27/2020 3:05 PM

## 2020-03-27 NOTE — Patient Instructions (Addendum)
It was a pleasure to see you today!  1. The order for a new CPAP is being processed, my nursing staff reached out to the supplier today, so you should get it soon. Please let me know if you don't receive it within the next week.  2. For diabetes: re start metformin 500 mg once in the morning, as well as continue lantus, and start ozempic. We've given you a 1 month's supply.  3. Follow up in 1 month  4. We will get some labs today.  If they are abnormal or we need to do something about them, I will call you.  If they are normal, I will send you a message on MyChart (if it is active) or a letter in the mail.  If you don't hear from [place] in 2 weeks, please call the office  [phone].  5. Take omeprazole first thing in the morning for 2 weeks, eat 30 minutes after taking the medicine. Let me know if you do not have improvement in your acid reflux.   Be Well,  Dr. Chauncey Reading

## 2020-03-28 DIAGNOSIS — K219 Gastro-esophageal reflux disease without esophagitis: Secondary | ICD-10-CM | POA: Insufficient documentation

## 2020-03-28 MED ORDER — INSULIN GLARGINE 100 UNITS/ML SOLOSTAR PEN: 30.0000 [IU] | PEN_INJECTOR | Freq: Every day | SUBCUTANEOUS | 11 refills | Status: DC

## 2020-03-28 MED ORDER — OZEMPIC (0.25 OR 0.5 MG/DOSE) 2 MG/1.5ML ~~LOC~~ SOPN: 0.2500 mg | PEN_INJECTOR | SUBCUTANEOUS | 3 refills | Status: DC

## 2020-03-28 NOTE — Assessment & Plan Note (Addendum)
Hypertension: - Medications: coreg 25 mg BID, amlodipine 10mg  qhs - Compliance: good - Checking BP at home: yes - Denies any SOB, CP, vision changes, LE edema, medication SEs, or symptoms of hypotension - Diet: SAD - Exercise: none Well controlled, <130/80, continue current regimen.

## 2020-03-28 NOTE — Assessment & Plan Note (Signed)
Reordered CPAP

## 2020-03-28 NOTE — Assessment & Plan Note (Signed)
Patient occasionally has GERD, counseled on pros/cons of prolonged PPI use. No sx of gerd now, counseled on avoiding trigger foods. I recommended H2 blocker, such as famotidine. With shared decision making, patient will try a 1-2 week course of omeprazole in case of flare of GERD symptoms instead of chronic PPI use.

## 2020-03-28 NOTE — Assessment & Plan Note (Addendum)
Patient counseled extensively about long-term sequelae of uncontrolled DMT2. Pharmacy able to assist with teaching. Patient given supply of lantus for 1 month, trained with ozempic 0.25mg  and given 1 month supply. Will obtain urine microalbumin to assess proteinuria. Ideally once A1c under better control (</=9), plan to restart jardiance to reduce risk of vaginal candidiasis and achieve cardio and renal protection of SGLT2i. -urine microalbumin -ozempic 0.25mg  qweekly -lantus 30U qd -metformin 500 mg qd -f/u 1 month

## 2020-03-28 NOTE — Assessment & Plan Note (Signed)
Starting GLP-1 for DM, hopefully can achieve some weight loss as well.

## 2020-03-28 NOTE — Assessment & Plan Note (Signed)
Patient with h/o chronic lumbar radiculopathy, s/p fusion and new/different right sided back pain, no red flags. C/f UTI. Will obtain UA, urine cx.

## 2020-03-29 LAB — MICROALBUMIN / CREATININE URINE RATIO
Creatinine, Urine: 111 mg/dL
Microalb/Creat Ratio: 528 mg/g creat — ABNORMAL HIGH (ref 0–29)
Microalbumin, Urine: 585.6 ug/mL

## 2020-03-30 ENCOUNTER — Other Ambulatory Visit: Payer: Self-pay

## 2020-03-30 ENCOUNTER — Inpatient Hospital Stay (HOSPITAL_BASED_OUTPATIENT_CLINIC_OR_DEPARTMENT_OTHER): Payer: Medicare Other | Admitting: Internal Medicine

## 2020-03-30 DIAGNOSIS — C3490 Malignant neoplasm of unspecified part of unspecified bronchus or lung: Secondary | ICD-10-CM | POA: Diagnosis not present

## 2020-03-30 DIAGNOSIS — C3492 Malignant neoplasm of unspecified part of left bronchus or lung: Secondary | ICD-10-CM | POA: Diagnosis not present

## 2020-03-30 NOTE — Progress Notes (Signed)
[place] [phone]   [phone]  PROGRESS NOTE FOR TELEMEDICINE VISITS  Theresa [name], [address] [place] [address]  I connected with@ on 03/30/20 at  1:30 PM EST by video enabled telemedicine visit and verified that I am speaking with the correct person using two identifiers.   I discussed the limitations, risks, security and privacy concerns of performing an evaluation and management service by telemedicine and the availability of in-person appointments. I also discussed with the patient that there may be a patient responsible charge related to this service. The patient expressed understanding and agreed to proceed.  Other persons participating in the visit and their role in the encounter:  None  Patient's location: Home Provider's location: [facility] [place]  DIAGNOSIS: Recurrent non-small cell lung cancer initially diagnosed as stage IA (T1b, N0, MX) adenocarcinoma in November 2009.   PRIOR THERAPY:  #1 Status post left upper lobe trisegmentectomy with lymph node dissection under the care of Dr. Arlyce Dice on March 24, 2009.  #2 Concurrent chemoradiation with carboplatin for AUC of 2 and paclitaxel 45 mg/M2 given weekly with radiation.   CURRENT THERAPY: Observation.   INTERVAL HISTORY: Theresa [name] 55 y.o. female had a video MyChart virtual visit with me today for evaluation and discussion of her scan results.  The patient is feeling fine today with no concerning complaints except for persistent cough with thick mucus.  She uses Mucinex as needed.  She denied having any current chest pain, shortness of breath or hemoptysis.  She denied having any fever or chills.  She has no nausea, vomiting, diarrhea or constipation.  She denied having any headache or visual changes.  She had repeat CT scan of the chest performed recently and she is here for evaluation and discussion of her risk her results.  MEDICAL  HISTORY: Past Medical History:  Diagnosis Date  . Anxiety   . Arthritis   . Blindness, legal    glaucoma and retinitis  pigmentosa  . Chronic eczema   . Chronic headaches 10-17-11   not migraines- just regular  . Constipation   . Depression   . Diabetes mellitus type II    diet   . Family history of anesthesia complication    1 son had malignant hyperthermia at 54yrs  tonsils rained  . Family history of malignant hyperthermia    1 son 16 years ago  . Fibromyalgia    denies, has not been diagnosited  . GERD (gastroesophageal reflux disease)   . Glaucoma   . History of chemotherapy 01/2011 to 03/2011   concurrent w/radiation therapy  . Hx of radiation therapy 01-17-11 to 03-18-11   lung  . Hyperlipidemia   . Hypertension   . Lung cancer ([facility])    lung ca dx 2010  . Malignant hyperthermia    As of 12/30/15, no personal MH history, but reported her son has a history of MH ~ 2000 (no confirmatory testing done)  . Onychomycosis   . Retinitis pigmentosa   . Shortness of breath    with exertion  . Sleep apnea    cpap 5 yrs    ALLERGIES:  is allergic to penicillins, codeine, morphine and related, and tomato.  MEDICATIONS:  Current Outpatient Medications  Medication Sig Dispense Refill  . albuterol (PROAIR HFA) 108 (90 Base) MCG/ACT inhaler Inhale 2 puffs into the lungs every 4 (four) hours as needed for wheezing or shortness of breath. 8 g 3  . albuterol (  PROVENTIL) (2.5 MG/3ML) 0.083% nebulizer solution Take 6 mLs (5 mg total) by nebulization every 6 (six) hours as needed for wheezing or shortness of breath. 150 mL 1  . amLODipine (NORVASC) 10 MG tablet Take 1 tablet (10 mg total) by mouth daily. 90 tablet 3  . aspirin EC 81 MG tablet Take 1 tablet (81 mg total) by mouth daily. 30 tablet 11  . atorvastatin (LIPITOR) 80 MG tablet Take 1 tablet (80 mg total) by mouth daily. 90 tablet 3  . carvedilol (COREG) 25 MG tablet Take 1 tablet (25 mg total) by mouth 2 (two) times daily with a  meal. 180 tablet 1  . clobetasol cream (TEMOVATE) 8.31 % Apply 1 application topically 2 (two) times daily. 60 g 1  . DULoxetine (CYMBALTA) 60 MG capsule TAKE 1 CAPSULE BY MOUTH EVERY DAY 90 capsule 1  . empagliflozin (JARDIANCE) 10 MG TABS tablet Take 1 tablet (10 mg total) by mouth daily. 90 tablet 3  . fluticasone (FLONASE) 50 MCG/ACT nasal spray SPRAY 2 SPRAYS INTO EACH NOSTRIL EVERY DAY 48 mL 2  . gabapentin (NEURONTIN) 300 MG capsule TAKE 1 CAPSULE BY MOUTH THREE TIMES A DAY 270 capsule 1  . insulin glargine (LANTUS) 100 unit/mL SOPN Inject 30 Units into the skin daily. 15 mL 11  . Insulin Pen Needle (PEN NEEDLES) 32G X 4 MM MISC 1 Stick by Does not apply route daily. 100 each 3  . linaclotide (LINZESS) 290 MCG CAPS capsule Take 1 capsule (290 mcg total) by mouth daily. 90 capsule 3  . metFORMIN (GLUCOPHAGE) 1000 MG tablet Take 1 tablet (1,000 mg total) by mouth 2 (two) times daily with a meal. 180 tablet 3  . montelukast (SINGULAIR) 10 MG tablet Take 1 tablet (10 mg total) by mouth at bedtime. 90 tablet 3  . omeprazole (PRILOSEC) 20 MG capsule Take 1 capsule (20 mg total) by mouth daily. 15 capsule 0  . Semaglutide,0.25 or 0.5MG /DOS, (OZEMPIC, 0.25 OR 0.5 MG/DOSE,) 2 MG/1.5ML SOPN Inject 0.25 mg into the skin once a week. 3 each 3  . timolol (TIMOPTIC) 0.5 % ophthalmic solution Place 1 drop into both eyes 2 (two) times daily.    . Travoprost, BAK Free, (TRAVATAN) 0.004 % SOLN ophthalmic solution Place 1 drop into both eyes at bedtime.    Marland Kitchen umeclidinium-vilanterol (ANORO ELLIPTA) 62.5-25 MCG/INH AEPB Inhale 1 puff into the lungs daily. 1 each 0   No current facility-administered medications for this visit.    SURGICAL HISTORY:  Past Surgical History:  Procedure Laterality Date  . ABDOMINAL HYSTERECTOMY  09/19/2001   hx. of fibroids. TAH. Ovaries remain.   Marland Kitchen BACK SURGERY  06/2013  . CHEST TUBE INSERTION   04/01/08   L hydropneumothorax  . EYE SURGERY Bilateral    lens implant  . KNEE  ARTHROSCOPY  10-17-11   bil. knee scope(left was torn ligament)  . LUMBAR LAMINECTOMY/DECOMPRESSION MICRODISCECTOMY  10/27/2011   Procedure: LUMBAR LAMINECTOMY/DECOMPRESSION MICRODISCECTOMY;  Surgeon: Johnn Hai, MD;  Location: [facility];  Service: Orthopedics;  Laterality: N/A;  L4-L5  . lung surgery  03/24/08   L vats, L thoracotomy and LUL trisegmentectomy with node dissection  . MAXIMUM ACCESS (MAS)POSTERIOR LUMBAR INTERBODY FUSION (PLIF) 1 LEVEL N/A 07/03/2013   Procedure: FOR MAXIMUM ACCESS (MAS) POSTERIOR LUMBAR INTERBODY FUSION (PLIF) 1 LEVEL;  Surgeon: Eustace Moore, MD;  Location: [facility];  Service: Neurosurgery;  Laterality: N/A;  FOR MAXIMUM ACCESS (MAS) POSTERIOR LUMBAR INTERBODY FUSION (PLIF) 1 LEVEL (L4-L5)  .  PORT-A-CATH REMOVAL Right 11/26/2013   Procedure: REMOVAL PORT-A-CATH;  Surgeon: Gaye Pollack, MD;  Location: [facility];  Service: Thoracic;  Laterality: Right;  . PORTACATH PLACEMENT  02/11/2011   Procedure: INSERTION PORT-A-CATH;  Surgeon: Pierre Bali, MD;  Location: [facility];  Service: Thoracic;  Laterality: Right;  9.6Fr. Pre-attached Power Port in Right Internal Jugular  -right chest-remains inplace 10-17-11  . SPINAL CORD STIMULATOR INSERTION N/A 02/03/2016   Procedure: LUMBAR SPINAL CORD STIMULATOR INSERTION;  Surgeon: Eustace Moore, MD;  Location: [facility];  Service: Neurosurgery;  Laterality: N/A;    REVIEW OF SYSTEMS:  A comprehensive review of systems was negative except for: Respiratory: positive for cough and sputum    LABORATORY DATA: Lab Results  Component Value Date   WBC 7.8 03/26/2020   HGB 15.9 (H) 03/26/2020   HCT 47.9 (H) 03/26/2020   MCV 91.4 03/26/2020   PLT 199 03/26/2020      Chemistry      Component Value Date/Time   NA 138 03/26/2020 0948   NA 141 01/29/2020 1436   NA 139 02/06/2015 0901   K 3.5 03/26/2020 0948   K 3.9 02/06/2015 0901   CL 104 03/26/2020 0948   CL 102 01/18/2012 0805   CO2 26 03/26/2020 0948   CO2 28 02/06/2015 0901    BUN 11 03/26/2020 0948   BUN 13 01/29/2020 1436   BUN 10.8 02/06/2015 0901   CREATININE 1.50 (H) 03/26/2020 0948   CREATININE 1.5 (H) 02/06/2015 0901      Component Value Date/Time   CALCIUM 9.1 03/26/2020 0948   CALCIUM 9.6 02/06/2015 0901   ALKPHOS 74 03/26/2020 0948   ALKPHOS 71 02/06/2015 0901   AST 11 (L) 03/26/2020 0948   AST 12 02/06/2015 0901   ALT 15 03/26/2020 0948   ALT 16 02/06/2015 0901   BILITOT 0.8 03/26/2020 0948   BILITOT 0.87 02/06/2015 0901       RADIOGRAPHIC STUDIES: CT Chest Wo Contrast  Result Date: 03/24/2020 CLINICAL DATA:  Primary Cancer Type: Lung Imaging Indication: Restaging for new clinical signs or symptoms Interval therapy since last imaging? No Initial Cancer Diagnosis Date: 03/24/2008; Established by: Biopsy-proven Detailed Pathology: Recurrent non-small cell lung cancer initially diagnosed as stage IA, adenocarcinoma. Primary Tumor location: Left upper lobe. Recurrence? Yes; Date(s) of recurrence: 08/15/2019; established by: Imaging only Right lower lobe pulmonary nodules concerning for disease metastasis versus metachronous primary. Surgeries: Left upper lobe trisegmentectomy 03/24/2009. Spinal cord stimulator 02/03/2016. Chemotherapy: Yes; Ongoing?  No; Most recent administration: 2013 Immunotherapy? No Radiation therapy? Yes; Date Range: 01/17/2011 - 03/18/2011; Target: Left lung EXAM: CT CHEST WITHOUT CONTRAST TECHNIQUE: Multidetector CT imaging of the chest was performed following the standard protocol without IV contrast. COMPARISON:  Most recent CT chest 08/14/2019.  09/17/2019 PET-CT. FINDINGS: Cardiovascular: No significant vascular findings. Normal heart size. No pericardial effusion. Mediastinum/Nodes: No axillary or supraclavicular adenopathy. No mediastinal or hilar adenopathy. No pericardial fluid. Esophagus normal. Lungs/Pleura: Lobulated nodule in the superior segment of the RIGHT lower lobe measures 10 mm x 8 mm (image 57/7) compared with  10 mm x 8 mm on comparison (6221). Two adjacent small satellite nodules measuring approximately 1-2 mm each (image 57/7) are slightly more prominent than comparison exam. Nodule in the more inferior RIGHT lower lobe has resolved in the interval Postsurgical change and radiation change in the LEFT hemithorax is stable. No new nodularity. Upper Abdomen: Limited view of the liver, kidneys, pancreas are unremarkable. Normal adrenal glands. Musculoskeletal: No aggressive  osseous lesion. IMPRESSION: 1. Lobular nodule in the superior segment of the RIGHT lower lobe is not appreciably changed in size; however, there are two adjacent satellite nodules which are very small but more prominent than comparison exam. Recommend continued CT surveillance of the suspicious lesion. 2. Interval resolution additional nodule in the RIGHT lower lobe. 3. Stable postsurgical/therapy change in the LEFT hemithorax. Electronically Signed   By: Suzy Bouchard M.D.   On: 03/24/2020 12:37    ASSESSMENT AND PLAN: This is a very pleasant 55 years old African-American female with history of non-small cell lung cancer diagnosed in 2009 status post concurrent chemoradiation. The patient has been in observation since that time with no concerning issues.  She was found on recent CT scan of the chest in early June 2021 to have suspicious right lower lobe pulmonary nodules concerning for disease metastasis versus metachronous primary. She had a PET scan few months ago and it showed no hypermetabolic activity in either of the new right lower lobe pulmonary nodules which is reassuring but low-grade or well differentiated neoplasm could not be completely excluded. The patient is currently on observation and she had repeat CT scan of the chest performed recently.  I personally and independently reviewed the scans and discussed the results with the patient today. Her scan showed no concerning findings for disease recurrence or progression. I  recommended for the patient to continue on observation with repeat CT scan of the chest in 6 months. She was advised to call immediately if she has any other concerning symptoms in the interval. I discussed the assessment and treatment plan with the patient. The patient was provided an opportunity to ask questions and all were answered. The patient agreed with the plan and demonstrated an understanding of the instructions.   The patient was advised to call back or seek an in-person evaluation if the symptoms worsen or if the condition fails to improve as anticipated. The total time spent in the appointment was 20 minutes.   Eilleen Kempf, MD 03/30/2020 11:52 AM  Disclaimer: This note was dictated with voice recognition software. Similar sounding words can inadvertently be transcribed and may not be corrected upon review.

## 2020-03-31 NOTE — Telephone Encounter (Signed)
Received below message from [employer].   Received, Thank you!     Talbot Grumbling, RN

## 2020-04-08 ENCOUNTER — Telehealth: Payer: Self-pay | Admitting: Family Medicine

## 2020-04-08 NOTE — Telephone Encounter (Signed)
Attempted to contact patient regarding CPAP machine. It is currently in process, but 3rd party provider of DME requires specific pressure settings. Left HIPAA compliant VM for patient to call office back and let us know her pressure settings so I can document and send to company. Patient has OSA and has had improvement in sleep, symptoms, etc with use of CPAP in the last year, however currently broken/infested with bugs (thus the request for new one).  Gladys Damme, MD [facility], PGY-2

## 2020-04-16 ENCOUNTER — Other Ambulatory Visit: Payer: Self-pay | Admitting: Family Medicine

## 2020-04-16 DIAGNOSIS — K581 Irritable bowel syndrome with constipation: Secondary | ICD-10-CM

## 2020-04-23 ENCOUNTER — Other Ambulatory Visit: Payer: Self-pay | Admitting: Family Medicine

## 2020-04-23 DIAGNOSIS — K219 Gastro-esophageal reflux disease without esophagitis: Secondary | ICD-10-CM

## 2020-05-04 NOTE — Progress Notes (Signed)
    SUBJECTIVE:   CHIEF COMPLAINT / HPI: DMT2  DMT2: Patient currently taking lantus 20U, metformin 1000mg  BID, and ozempic 0.5 mg weekly. She reports her fasting BGs have been 97-150s. Hgb A1c 10.4% in November, repeat today is 7.6%. Patient desires further weight loss.   CPAP: will call with pressure setting.  Sinus: Patient reports pain and pressure in her forehead and cheeks with nasal congestion for the last week. She has had sinus problems in the past. Reports ear pain as well. She has tried flonase, zyrtec, without improvement. Denies fever, sore throat, cough.  CKD stage 3b: will obtain renal function panel per nephrology request.  PERTINENT  PMH / PSH:   OBJECTIVE:   BP 140/82   Pulse 79   Ht 5\' 2"  (1.575 m)   Wt 207 lb 2 oz (94 kg)   SpO2 97%   BMI 37.88 kg/m   Nursing note and vitals reviewed GEN: age-appropriate, AAW, resting comfortably in chair, NAD, obese HEENT: NCAT. PERRLA. Sclera without injection or icterus. MMM. Clear oropharynx, nasal passages edematous and erythematous with clear, thin secretions b/l. EAC erythematous b/l, Right TM bulging, Left TM non bulging. Neck: Supple. No LAD Cardiac: Regular rate and rhythm. Normal S1/S2. No murmurs, rubs, or gallops appreciated. 2+ radial pulses. Lungs: Clear bilaterally to ascultation. No increased WOB, no accessory muscle usage. No w/r/r. Neuro: Alert and at baseline Ext: no edema Psych: Pleasant and appropriate  ASSESSMENT/PLAN:   Chronic kidney disease (CKD), stage III (moderate) Patient saw nephrology this month, recommend obtaining renal function panel, will get today.  Diabetes mellitus type 2, uncontrolled DM is finally well controlled with ozempic 0.5 mg weekly as A1c today is 7.6% down from 10.4% in November. Patient still desires more weight loss, so will increase ozempic to 1 mg weekly. Continue metformin. Reminded patient re: ophtho exam.  Morbid obesity Increase ozempic to 1mg   qweekly.  Obstructive sleep apnea Patient to call with pressure settings so I can inform DME company for new cpap.  Sinusitis, acute Acute on chronic, has had sinus problems in the past, been stable for 1 year. She has tried conservative measures such as flonase and 2nd gen H2 blocker. Recommend using afrin for 3 days, cautioned about potential for overuse and rebound infection. Then recommend continuing with current regimen of zyrtec and flonase. Patient very concerned about bacterial infection, however etiology is far more likely to be viral. If after 3 days of afrin patient does not have relief, will give her paper rx for doxycycline (she has PCN allergy) 100 mg BID x5 days.     Gladys Damme, MD [place]

## 2020-05-05 ENCOUNTER — Ambulatory Visit (INDEPENDENT_AMBULATORY_CARE_PROVIDER_SITE_OTHER): Payer: Medicare Other | Admitting: Family Medicine

## 2020-05-05 ENCOUNTER — Encounter: Payer: Self-pay | Admitting: Family Medicine

## 2020-05-05 ENCOUNTER — Other Ambulatory Visit: Payer: Self-pay

## 2020-05-05 VITALS — BP 140/82 | HR 79 | Ht 62.0 in | Wt 207.1 lb

## 2020-05-05 DIAGNOSIS — J0101 Acute recurrent maxillary sinusitis: Secondary | ICD-10-CM

## 2020-05-05 DIAGNOSIS — N1832 Chronic kidney disease, stage 3b: Secondary | ICD-10-CM

## 2020-05-05 DIAGNOSIS — E1165 Type 2 diabetes mellitus with hyperglycemia: Secondary | ICD-10-CM

## 2020-05-05 DIAGNOSIS — J0110 Acute frontal sinusitis, unspecified: Secondary | ICD-10-CM | POA: Diagnosis not present

## 2020-05-05 DIAGNOSIS — G4733 Obstructive sleep apnea (adult) (pediatric): Secondary | ICD-10-CM

## 2020-05-05 LAB — POCT GLYCOSYLATED HEMOGLOBIN (HGB A1C): Hemoglobin A1C: 7.6 % — AB (ref 4.0–5.6)

## 2020-05-05 MED ORDER — DOXYCYCLINE HYCLATE 100 MG PO TABS: 100.0000 mg | ORAL_TABLET | Freq: Two times a day (BID) | ORAL | 0 refills | Status: DC

## 2020-05-05 MED ORDER — METFORMIN HCL 500 MG PO TABS: 1000.0000 mg | ORAL_TABLET | Freq: Two times a day (BID) | ORAL | 6 refills | Status: DC

## 2020-05-05 MED ORDER — AFRIN NASAL SPRAY 0.05 % NA SOLN: 1.0000 | Freq: Two times a day (BID) | NASAL | 0 refills | Status: DC

## 2020-05-05 MED ORDER — PEN NEEDLES 32G X 4 MM MISC: 1.0000 | Freq: Every day | 3 refills | Status: DC

## 2020-05-05 NOTE — Patient Instructions (Addendum)
It was a pleasure to see you today!  1. We will get some labs today.  If they are abnormal or we need to do something about them, I will call you.  If they are normal, I will send you a message on MyChart (if it is active) or a letter in the mail.  If you don't hear from [place] in 2 weeks, please call the office  [phone].  2. Use afrin for three days and then STOP. If after three days the sinus pressure and pain returns, you can use doxycycline twice a day for 5 days. Come back if you are no better after this.  3. Increase ozempic to 0.5mg  weekly for weight loss and diabetes   Be Well,  Dr. Chauncey Reading

## 2020-05-06 ENCOUNTER — Telehealth: Payer: Self-pay

## 2020-05-06 DIAGNOSIS — G4733 Obstructive sleep apnea (adult) (pediatric): Secondary | ICD-10-CM

## 2020-05-06 LAB — RENAL FUNCTION PANEL
Albumin: 4.9 g/dL (ref 3.8–4.9)
BUN/Creatinine Ratio: 7 — ABNORMAL LOW (ref 9–23)
BUN: 9 mg/dL (ref 6–24)
CO2: 23 mmol/L (ref 20–29)
Calcium: 9.5 mg/dL (ref 8.7–10.2)
Chloride: 100 mmol/L (ref 96–106)
Creatinine, Ser: 1.27 mg/dL — ABNORMAL HIGH (ref 0.57–1.00)
GFR calc Af Amer: 55 mL/min/{1.73_m2} — ABNORMAL LOW (ref >59)
GFR calc non Af Amer: 47 mL/min/{1.73_m2} — ABNORMAL LOW (ref >59)
Glucose: 73 mg/dL (ref 65–99)
Phosphorus: 2.7 mg/dL — ABNORMAL LOW (ref 3.0–4.3)
Potassium: 4.1 mmol/L (ref 3.5–5.2)
Sodium: 140 mmol/L (ref 134–144)

## 2020-05-06 NOTE — Telephone Encounter (Signed)
Patient calls nurse line with CPAP settings. Patient reports that settings are the same from machine in 2015, with the following settings from previous order.   Settings: 16-20 Optimal CPAP pressure: 19cm H2o w/ heated humidity Titration Count: CPAP pressure started at 5cm H2o and titrated up to 19cm H2o w/ heated humidity and was well tolerated O2: none required   Talbot Grumbling, RN

## 2020-05-07 NOTE — Assessment & Plan Note (Signed)
DM is finally well controlled with ozempic 0.5 mg weekly as A1c today is 7.6% down from 10.4% in November. Patient still desires more weight loss, so will increase ozempic to 1 mg weekly. Continue metformin. Reminded patient re: ophtho exam.

## 2020-05-07 NOTE — Assessment & Plan Note (Signed)
Acute on chronic, has had sinus problems in the past, been stable for 1 year. She has tried conservative measures such as flonase and 2nd gen H2 blocker. Recommend using afrin for 3 days, cautioned about potential for overuse and rebound infection. Then recommend continuing with current regimen of zyrtec and flonase. Patient very concerned about bacterial infection, however etiology is far more likely to be viral. If after 3 days of afrin patient does not have relief, will give her paper rx for doxycycline (she has PCN allergy) 100 mg BID x5 days.

## 2020-05-07 NOTE — Assessment & Plan Note (Signed)
Increase ozempic to 1mg  qweekly.

## 2020-05-07 NOTE — Assessment & Plan Note (Signed)
Patient saw nephrology this month, recommend obtaining renal function panel, will get today.

## 2020-05-07 NOTE — Assessment & Plan Note (Signed)
Patient to call with pressure settings so I can inform DME company for new cpap.

## 2020-05-13 ENCOUNTER — Other Ambulatory Visit: Payer: Self-pay

## 2020-05-13 ENCOUNTER — Other Ambulatory Visit: Payer: Self-pay | Admitting: Family Medicine

## 2020-05-13 DIAGNOSIS — E1165 Type 2 diabetes mellitus with hyperglycemia: Secondary | ICD-10-CM

## 2020-05-13 MED ORDER — OZEMPIC (0.25 OR 0.5 MG/DOSE) 2 MG/1.5ML ~~LOC~~ SOPN
0.5000 mg | PEN_INJECTOR | SUBCUTANEOUS | 3 refills | Status: DC
Start: 2020-05-13 — End: 2021-11-17

## 2020-05-13 NOTE — Telephone Encounter (Signed)
Patient returns call to nurse line regarding previous message. I will send message to [employer] with updated settings.     Talbot Grumbling, RN

## 2020-05-14 NOTE — Telephone Encounter (Signed)
Please see below message from [employer].   Hello,   I need the settings to be added to the order signed and dated by the MD. Due to insurance reasons we have to have specific orders written on the order signed and dated by the MD. Notes and verbal orders are not allowed for insurance to process. Please have the order updated.   Order is in a pending info status at this time.   Thank you,   Brad New     Talbot Grumbling, RN

## 2020-05-16 ENCOUNTER — Encounter: Payer: Self-pay | Admitting: Family Medicine

## 2020-05-18 NOTE — Telephone Encounter (Signed)
An electronic order will work. Please route message back to "RN team" once completed. Thanks!  Talbot Grumbling, RN

## 2020-05-18 NOTE — Addendum Note (Signed)
Addended by: Bridget Hartshorn on: 05/18/2020 04:47 PM   Modules accepted: Orders

## 2020-06-08 ENCOUNTER — Other Ambulatory Visit: Payer: Self-pay | Admitting: Family Medicine

## 2020-08-06 ENCOUNTER — Other Ambulatory Visit: Payer: Self-pay | Admitting: Family Medicine

## 2020-08-06 DIAGNOSIS — I10 Essential (primary) hypertension: Secondary | ICD-10-CM

## 2020-08-06 DIAGNOSIS — E785 Hyperlipidemia, unspecified: Secondary | ICD-10-CM

## 2020-08-11 ENCOUNTER — Other Ambulatory Visit: Payer: Self-pay | Admitting: Nephrology

## 2020-08-11 DIAGNOSIS — N1832 Chronic kidney disease, stage 3b: Secondary | ICD-10-CM

## 2020-08-17 ENCOUNTER — Ambulatory Visit
Admission: RE | Admit: 2020-08-17 | Discharge: 2020-08-17 | Disposition: A | Discharge status: Home / Self Care | Payer: Medicare Other | Source: Ambulatory Visit | Attending: Nephrology | Admitting: Nephrology

## 2020-08-17 DIAGNOSIS — N1832 Chronic kidney disease, stage 3b: Secondary | ICD-10-CM

## 2020-09-25 ENCOUNTER — Ambulatory Visit (HOSPITAL_COMMUNITY)
Admission: RE | Admit: 2020-09-25 | Discharge: 2020-09-25 | Disposition: A | Discharge status: Home / Self Care | Payer: Medicare Other | Source: Ambulatory Visit | Attending: Internal Medicine | Admitting: Internal Medicine

## 2020-09-25 ENCOUNTER — Other Ambulatory Visit: Payer: Self-pay

## 2020-09-25 DIAGNOSIS — C3490 Malignant neoplasm of unspecified part of unspecified bronchus or lung: Secondary | ICD-10-CM

## 2020-09-29 ENCOUNTER — Telehealth: Payer: Self-pay | Admitting: Physician Assistant

## 2020-09-29 NOTE — Telephone Encounter (Signed)
Scheduled appts per 7/18 sch msg. Pt aware.

## 2020-09-29 NOTE — Progress Notes (Signed)
[place] OFFICE PROGRESS NOTE  Theresa Damme, MD [phone] [address] [place] [address]  DIAGNOSIS: Recurrent non-small cell lung cancer initially diagnosed as stage IA (T1b, N0, MX) adenocarcinoma in November 2009. She had evidence of recurrent disease in September 2012.   PRIOR THERAPY: #1 Status post left upper lobe trisegmentectomy with lymph node dissection under the care of Dr. Arlyce Dice on March 24, 2009. #2 Concurrent chemoradiation with carboplatin for AUC of 2 and paclitaxel 45 mg/M2 given weekly with radiation in 2012.    CURRENT THERAPY: Observation  INTERVAL HISTORY: Theresa [name] 55 y.o. female returns to the clinic today for a follow-up visit accompanied by her daughter.  The patient was last seen in January 2022.  The patient is currently on observation for a history of non-small cell lung cancer.  The patient is feeling well today without any concerning complaints.  She denies any recent fever, chills, night sweats, or unexplained weight loss.  She denies any chest pain, shortness of breath, cough, or hemoptysis. She has some wheezing due to her COPD. Her PCP prescribes her inhalers. She smokes approximately 1 pack of cigarettes per day. She denies any nausea, vomiting, diarrhea, or constipation.  She denies any headaches. 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 She denies any rashes or skin changes.  The patient recently had a restaging CT scan performed.  The patient is here today for evaluation to review her scan results.     MEDICAL HISTORY: Past Medical History:  Diagnosis Date   Anxiety    Arthritis    Blindness, legal    glaucoma and retinitis  pigmentosa   Chronic eczema    Chronic headaches 10-17-11   not migraines- just regular   Constipation    Depression    Diabetes mellitus type II    diet    Family history of anesthesia complication    1 son had malignant hyperthermia at 55yrs  tonsils rained   Family history of  malignant hyperthermia    1 son 16 years ago   Fibromyalgia    denies, has not been diagnosited   GERD (gastroesophageal reflux disease)    Glaucoma    History of chemotherapy 01/2011 to 03/2011   concurrent w/radiation therapy   Hx of radiation therapy 01-17-11 to 03-18-11   lung   Hyperlipidemia    Hypertension    Lung cancer ([place])    lung ca dx 2010   Malignant hyperthermia    As of 12/30/15, no personal MH history, but reported her son has a history of MH ~ 2000 (no confirmatory testing done)   Onychomycosis    Retinitis pigmentosa    Shortness of breath    with exertion   Sleep apnea    cpap 5 yrs    ALLERGIES:  is allergic to penicillins, codeine, morphine and related, and tomato.  MEDICATIONS:  Current Outpatient Medications  Medication Sig Dispense Refill   albuterol (PROAIR HFA) 108 (90 Base) MCG/ACT inhaler Inhale 2 puffs into the lungs every 4 (four) hours as needed for wheezing or shortness of breath. 8 g 3   albuterol (PROVENTIL) (2.5 MG/3ML) 0.083% nebulizer solution Take 6 mLs (5 mg total) by nebulization every 6 (six) hours as needed for wheezing or shortness of breath. 150 mL 1   amLODipine (NORVASC) 10 MG tablet Take 1 tablet (10 mg total) by mouth daily. 90 tablet 3   aspirin EC 81 MG tablet Take 1 tablet (81  mg total) by mouth daily. 30 tablet 11   atorvastatin (LIPITOR) 80 MG tablet TAKE 1 TABLET BY MOUTH EVERY DAY 90 tablet 3   carvedilol (COREG) 25 MG tablet TAKE 1 TABLET (25 MG TOTAL) BY MOUTH 2 (TWO) TIMES DAILY WITH A MEAL. 180 tablet 1   clobetasol cream (TEMOVATE) 6.72 % Apply 1 application topically 2 (two) times daily. 60 g 1   doxycycline (VIBRA-TABS) 100 MG tablet Take 1 tablet (100 mg total) by mouth 2 (two) times daily. 10 tablet 0   DULoxetine (CYMBALTA) 60 MG capsule TAKE 1 CAPSULE BY MOUTH EVERY DAY 90 capsule 1   empagliflozin (JARDIANCE) 10 MG TABS tablet Take 1 tablet (10 mg total) by mouth daily. 90 tablet 3   fluticasone (FLONASE) 50  MCG/ACT nasal spray SPRAY 2 SPRAYS INTO EACH NOSTRIL EVERY DAY 48 mL 2   gabapentin (NEURONTIN) 300 MG capsule TAKE 1 CAPSULE BY MOUTH THREE TIMES A DAY 270 capsule 1   insulin glargine (LANTUS) 100 unit/mL SOPN Inject 30 Units into the skin daily. 15 mL 11   Insulin Pen Needle (PEN NEEDLES) 32G X 4 MM MISC 1 Stick by Does not apply route daily. 100 each 3   LINZESS 290 MCG CAPS capsule TAKE 1 CAPSULE (290 MCG TOTAL) BY MOUTH DAILY. 90 capsule 3   metFORMIN (GLUCOPHAGE) 500 MG tablet Take 2 tablets (1,000 mg total) by mouth 2 (two) times daily with a meal. 120 tablet 6   montelukast (SINGULAIR) 10 MG tablet Take 1 tablet (10 mg total) by mouth at bedtime. 90 tablet 3   omeprazole (PRILOSEC) 20 MG capsule TAKE 1 CAPSULE BY MOUTH EVERY DAY 15 capsule 0   oxymetazoline (AFRIN NASAL SPRAY) 0.05 % nasal spray Place 1 spray into both nostrils 2 (two) times daily. 30 mL 0   Semaglutide,0.25 or 0.5MG /DOS, (OZEMPIC, 0.25 OR 0.5 MG/DOSE,) 2 MG/1.5ML SOPN Inject 0.5 mg into the skin once a week. 3 mL 3   timolol (TIMOPTIC) 0.5 % ophthalmic solution Place 1 drop into both eyes 2 (two) times daily.     Travoprost, BAK Free, (TRAVATAN) 0.004 % SOLN ophthalmic solution Place 1 drop into both eyes at bedtime.     umeclidinium-vilanterol (ANORO ELLIPTA) 62.5-25 MCG/INH AEPB Inhale 1 puff into the lungs daily. 1 each 0   No current facility-administered medications for this visit.    SURGICAL HISTORY:  Past Surgical History:  Procedure Laterality Date   ABDOMINAL HYSTERECTOMY  09/19/2001   hx. of fibroids. TAH. Ovaries remain.    BACK SURGERY  06/2013   CHEST TUBE INSERTION   04/01/08   L hydropneumothorax   EYE SURGERY Bilateral    lens implant   KNEE ARTHROSCOPY  10-17-11   bil. knee scope(left was torn ligament)   LUMBAR LAMINECTOMY/DECOMPRESSION MICRODISCECTOMY  10/27/2011   Procedure: LUMBAR LAMINECTOMY/DECOMPRESSION MICRODISCECTOMY;  Surgeon: Johnn Hai, MD;  Location: [facility];  Service: Orthopedics;   Laterality: N/A;  L4-L5   lung surgery  03/24/08   L vats, L thoracotomy and LUL trisegmentectomy with node dissection   MAXIMUM ACCESS (MAS)POSTERIOR LUMBAR INTERBODY FUSION (PLIF) 1 LEVEL N/A 07/03/2013   Procedure: FOR MAXIMUM ACCESS (MAS) POSTERIOR LUMBAR INTERBODY FUSION (PLIF) 1 LEVEL;  Surgeon: Eustace Moore, MD;  Location: [facility];  Service: Neurosurgery;  Laterality: N/A;  FOR MAXIMUM ACCESS (MAS) POSTERIOR LUMBAR INTERBODY FUSION (PLIF) 1 LEVEL (L4-L5)   PORT-A-CATH REMOVAL Right 11/26/2013   Procedure: REMOVAL PORT-A-CATH;  Surgeon: Gaye Pollack, MD;  Location:  [facility];  Service: Thoracic;  Laterality: Right;   PORTACATH PLACEMENT  02/11/2011   Procedure: INSERTION PORT-A-CATH;  Surgeon: Pierre Bali, MD;  Location: [facility];  Service: Thoracic;  Laterality: Right;  9.6Fr. Pre-attached Power Port in Right Internal Jugular  -right chest-remains inplace 10-17-11   SPINAL CORD STIMULATOR INSERTION N/A 02/03/2016   Procedure: LUMBAR SPINAL CORD STIMULATOR INSERTION;  Surgeon: Eustace Moore, MD;  Location: [facility];  Service: Neurosurgery;  Laterality: N/A;    REVIEW OF SYSTEMS:   Review of Systems  Constitutional: Negative for appetite change, chills, fatigue, fever and unexpected weight change.  HENT:   Negative for mouth sores, nosebleeds, sore throat and trouble swallowing.   Eyes: Negative for eye problems and icterus.  Respiratory: Positive for wheezing. Negative for cough, hemoptysis, shortness of breath. Cardiovascular: Negative for chest pain and leg swelling.  Gastrointestinal: Negative for abdominal pain, constipation, diarrhea, nausea and vomiting.  Genitourinary: Negative for bladder incontinence, difficulty urinating, dysuria, frequency and hematuria.   Musculoskeletal: Negative for back pain, gait problem, neck pain and neck stiffness.  Skin: Negative for itching and rash.  Neurological: Negative for dizziness, extremity weakness, gait problem, headaches, light-headedness  and seizures.  Hematological: Negative for adenopathy. Does not bruise/bleed easily.  Psychiatric/Behavioral: Negative for confusion, depression and sleep disturbance. The patient is not nervous/anxious.     PHYSICAL EXAMINATION:  Blood pressure 139/79, pulse 76, temperature (!) 97.5 F (36.4 C), temperature source Tympanic, resp. rate 20, height 5\' 2"  (1.575 m), weight 215 lb 9.6 oz (97.8 kg), SpO2 100 %.  ECOG PERFORMANCE STATUS: 0  Physical Exam  Constitutional: Oriented to person, place, and time and well-developed, well-nourished, and in no distress.HENT:  Head: Normocephalic and atraumatic.  Mouth/Throat: Oropharynx is clear and moist. No oropharyngeal exudate.  Eyes: Conjunctivae are normal. Right eye exhibits no discharge. Left eye exhibits no discharge. No scleral icterus.  Neck: Normal range of motion. Neck supple.  Cardiovascular: Normal rate, regular rhythm, normal heart sounds and intact distal pulses.   Pulmonary/Chest: Effort normal. Quiet breath sounds bilaterally. No respiratory distress. No rales.  Abdominal: Soft. Bowel sounds are normal. Exhibits no distension and no mass. There is no tenderness.  Musculoskeletal: Normal range of motion. Exhibits no edema.  Lymphadenopathy:    No cervical adenopathy.  Neurological: Alert and oriented to person, place, and time. Exhibits normal muscle tone. Examined in the wheelchair. Skin: Skin is warm and dry. No rash noted. Not diaphoretic. No erythema. No pallor.  Psychiatric: Mood, memory and judgment normal.  Vitals reviewed.  LABORATORY DATA: Lab Results  Component Value Date   WBC 7.8 03/26/2020   HGB 15.9 (H) 03/26/2020   HCT 47.9 (H) 03/26/2020   MCV 91.4 03/26/2020   PLT 199 03/26/2020      Chemistry      Component Value Date/Time   NA 140 05/05/2020 1427   NA 139 02/06/2015 0901   K 4.1 05/05/2020 1427   K 3.9 02/06/2015 0901   CL 100 05/05/2020 1427   CL 102 01/18/2012 0805   CO2 23 05/05/2020 1427   CO2  28 02/06/2015 0901   BUN 9 05/05/2020 1427   BUN 10.8 02/06/2015 0901   CREATININE 1.27 (H) 05/05/2020 1427   CREATININE 1.50 (H) 03/26/2020 0948   CREATININE 1.5 (H) 02/06/2015 0901      Component Value Date/Time   CALCIUM 9.5 05/05/2020 1427   CALCIUM 9.6 02/06/2015 0901   ALKPHOS 74 03/26/2020 0948   ALKPHOS 71 02/06/2015 0901  AST 11 (L) 03/26/2020 0948   AST 12 02/06/2015 0901   ALT 15 03/26/2020 0948   ALT 16 02/06/2015 0901   BILITOT 0.8 03/26/2020 0948   BILITOT 0.87 02/06/2015 0901       RADIOGRAPHIC STUDIES:  CT Chest Wo Contrast  Result Date: 09/28/2020 CLINICAL DATA:  Non-small-cell lung cancer.  Restaging. EXAM: CT CHEST WITHOUT CONTRAST TECHNIQUE: Multidetector CT imaging of the chest was performed following the standard protocol without IV contrast. COMPARISON:  03/24/2020 FINDINGS: Cardiovascular: The heart size is normal. No substantial pericardial effusion. No thoracic aortic aneurysm. Mediastinum/Nodes: No mediastinal lymphadenopathy. No evidence for lymphadenopathy in the right hilum on this noncontrast study. Surgical and post treatment changes noted left hilum and suprahilar region. The esophagus has normal imaging features. There is no axillary lymphadenopathy. Lungs/Pleura: Lobulated nodule measured previously in the superior segment right lower lobe is stable measuring 10 x 9 mm today compared to 10 x 8 mm previously. Tiny satellite micro nodules are also stable. More posteriorly in the right lower lobe is a 4 mm nodule (71/7) also unchanged. Volume loss left hemithorax with scarring in left apex and suprahilar left lung is not substantially changed in the interval No focal airspace consolidates crash that insert no pneumonia There is no evidence of pleural effusion. Upper Abdomen: Unremarkable. Musculoskeletal: No worrisome lytic or sclerotic osseous abnormality. Thoracic spinal stimulator device again noted. IMPRESSION: 1. Stable exam. No new or progressive  findings. 2. Post treatment changes in the left hilum and suprahilar left lung with volume loss and scarring in the left apex. 3. Stable lobular right lower lobe pulmonary nodules with adjacent satellite nodules. Continued close attention recommended. Electronically Signed   By: Misty Stanley M.D.   On: 09/28/2020 05:18     ASSESSMENT/PLAN:  This is a very pleasant 55 year old African-American female with a history of non-small cell lung cancer, adenocarcinoma.  She was initially diagnosed in 2009.  She underwent a left trisegmentectomy under the care of Dr. Arlyce Dice.  She had evidence of recurrent disease in recurrence in 2012 and she underwent a course of concurrent chemoradiation.  In early June 2021 the patient was found to have suspicious right lower lobe pulmonary nodules concerning for disease metastasis versus metachronous primary.  She had a PET scan which showed no hypermetabolic activity in either the new right lower lobe pulmonary nodule which is reassuring but low-grade or well differentiated neoplasm cannot be completely excluded.  The patient recently had a restaging CT scan performed.  Dr. Julien Nordmann personally and independently reviewed the scan and discussed the results with the patient today.  The scan showed no evidence of disease progression.  Dr. Julien Nordmann recommends that the patient continue on observation with a restaging CT scan of the chest in 1 year.  We will see her back for follow-up at that time for evaluation to review her scan results.  The patient was strongly encouraged to quit smoking.   The patient was advised to call immediately if she has any concerning symptoms in the interval. The patient voices understanding of current disease status and treatment options and is in agreement with the current care plan. All questions were answered. The patient knows to call the clinic with any problems, questions or concerns. We can certainly see the patient much sooner if  necessary      Orders Placed This Encounter  Procedures   CT Chest W Contrast    OK to hold contrast if creatinine 1.5    Standing Status:  Future    Standing Expiration Date:   09/30/2021    Order Specific Question:   If indicated for the ordered procedure, I authorize the administration of contrast media per Radiology protocol    Answer:   Yes    Order Specific Question:   Is patient pregnant?    Answer:   No    Order Specific Question:   Preferred imaging location?    Answer:   [facility]   CBC with Differential ([place] Only)    Standing Status:   Future    Standing Expiration Date:   09/30/2021   CMP ([place] only)    Standing Status:   Future    Standing Expiration Date:   09/30/2021       Tobe Sos Dallas Torok, PA-C 09/30/20  ADDENDUM: Hematology/Oncology Attending: I had a face-to-face encounter with the patient today.  I reviewed her records, lab and scan and recommended her care plan.  This is a very pleasant 55 years old African-American female with history of non-small cell lung cancer initially diagnosed in 2019 status post left trisegmentectomy.  The patient had evidence for disease recurrence in 2013 status post SBRT and currently on observation has been doing fine with no concerning complaints except for mild shortness of breath with exertion as well as fatigue.  She also continues to have visual disturbance and worsening blindness and she is followed by ophthalmology. Unfortunately she continues to smoke at regular basis.  She had repeat CT scan of the chest performed recently.  I personally and independently reviewed the scan and discussed the results with the patient today. Her scan showed no concerning findings for disease recurrence or metastasis. I recommended for the patient to continue on observation with repeat CT scan of the chest in 1 year. She was advised to call immediately if she has any concerning symptoms in the  interval. Disclaimer: This note was dictated with voice recognition software. Similar sounding words can inadvertently be transcribed and may be missed upon review. Eilleen Kempf, MD 09/30/20

## 2020-09-30 ENCOUNTER — Other Ambulatory Visit: Payer: Self-pay

## 2020-09-30 ENCOUNTER — Inpatient Hospital Stay: Payer: Medicare Other | Attending: Physician Assistant | Admitting: Physician Assistant

## 2020-09-30 VITALS — BP 139/79 | HR 76 | Temp 97.5°F | Resp 20 | Ht 62.0 in | Wt 215.6 lb

## 2020-09-30 DIAGNOSIS — R918 Other nonspecific abnormal finding of lung field: Secondary | ICD-10-CM | POA: Insufficient documentation

## 2020-09-30 DIAGNOSIS — Z85118 Personal history of other malignant neoplasm of bronchus and lung: Secondary | ICD-10-CM | POA: Diagnosis present

## 2020-09-30 DIAGNOSIS — C3492 Malignant neoplasm of unspecified part of left bronchus or lung: Secondary | ICD-10-CM

## 2020-10-19 ENCOUNTER — Telehealth: Payer: Self-pay

## 2020-10-19 NOTE — Telephone Encounter (Signed)
LVM to have pt call back to schedule AWV.   RE: confirm insurance and schedule AWV on my schedule if times are convenient for patient or other AWV schedule as template permits.   

## 2020-11-17 NOTE — Telephone Encounter (Signed)
Made 2nd attempt to schedule pt for AWV.   Closing note.

## 2020-12-01 ENCOUNTER — Telehealth: Payer: Self-pay | Admitting: Family Medicine

## 2020-12-01 NOTE — Telephone Encounter (Signed)
Requested medication (s) are due for refill today: yes  Requested medication (s) are on the active medication list: yes  Last refill: 05/06/19  #90   3 refills  Future visit scheduled no  Notes to clinic: Last refill by Dr. Ky Barban. Not listed as PCP. Please review  Requested Prescriptions  Pending Prescriptions Disp Refills   amLODipine (NORVASC) 10 MG tablet [Pharmacy Med Name: AMLODIPINE BESYLATE 10 MG TAB] 90 tablet 3    Sig: TAKE 1 TABLET BY MOUTH EVERY DAY     There is no refill protocol information for this order

## 2020-12-02 ENCOUNTER — Other Ambulatory Visit: Payer: Self-pay | Admitting: Family Medicine

## 2020-12-02 DIAGNOSIS — I10 Essential (primary) hypertension: Secondary | ICD-10-CM

## 2020-12-02 MED ORDER — AMLODIPINE BESYLATE 10 MG PO TABS: 10.0000 mg | ORAL_TABLET | Freq: Every day | ORAL | 0 refills | Status: DC

## 2020-12-02 NOTE — Telephone Encounter (Signed)
Not a [facility] Pt

## 2020-12-08 ENCOUNTER — Ambulatory Visit: Payer: Medicare Other | Admitting: Family Medicine

## 2020-12-15 ENCOUNTER — Other Ambulatory Visit: Payer: Self-pay

## 2020-12-15 ENCOUNTER — Encounter: Payer: Self-pay | Admitting: Family Medicine

## 2020-12-15 ENCOUNTER — Ambulatory Visit: Payer: Medicare Other | Admitting: Family Medicine

## 2020-12-15 ENCOUNTER — Other Ambulatory Visit: Payer: Self-pay | Admitting: Family Medicine

## 2020-12-15 VITALS — BP 111/67 | HR 69 | Ht 62.0 in | Wt 216.0 lb

## 2020-12-15 DIAGNOSIS — J0110 Acute frontal sinusitis, unspecified: Secondary | ICD-10-CM

## 2020-12-15 DIAGNOSIS — J0101 Acute recurrent maxillary sinusitis: Secondary | ICD-10-CM | POA: Diagnosis not present

## 2020-12-15 DIAGNOSIS — K581 Irritable bowel syndrome with constipation: Secondary | ICD-10-CM | POA: Diagnosis not present

## 2020-12-15 DIAGNOSIS — E1165 Type 2 diabetes mellitus with hyperglycemia: Secondary | ICD-10-CM

## 2020-12-15 LAB — POCT GLYCOSYLATED HEMOGLOBIN (HGB A1C): HbA1c, POC (controlled diabetic range): 10.8 % — AB (ref 0.0–7.0)

## 2020-12-15 LAB — GLUCOSE, POCT (MANUAL RESULT ENTRY): POC Glucose: 192 mg/dl — AB (ref 70–99)

## 2020-12-15 MED ORDER — DOXYCYCLINE HYCLATE 100 MG PO TABS: 100.0000 mg | ORAL_TABLET | Freq: Two times a day (BID) | ORAL | 0 refills | Status: DC

## 2020-12-15 MED ORDER — LINACLOTIDE 290 MCG PO CAPS: ORAL_CAPSULE | ORAL | 3 refills | Status: DC

## 2020-12-15 MED ORDER — INSULIN GLARGINE 100 UNIT/ML SOLOSTAR PEN: 20.0000 [IU] | PEN_INJECTOR | SUBCUTANEOUS | 11 refills | Status: DC

## 2020-12-15 MED ORDER — OZEMPIC (0.25 OR 0.5 MG/DOSE) 2 MG/1.5ML ~~LOC~~ SOPN
0.5000 mg | PEN_INJECTOR | SUBCUTANEOUS | 3 refills | Status: DC
Start: 2020-12-15 — End: 2021-11-17

## 2020-12-15 NOTE — Patient Instructions (Signed)
It was great seeing you today.  I am sorry you are having trouble with your sinuses.  I want you to continue taking your Flonase and Zyrtec.  I sent a prescription for doxycycline to your pharmacy which you will take twice daily.  Regarding your diabetes you really need to start taking her medications again.  I have sent a prescription for your Lantus as well as your Ozempic to your pharmacy.  To continue take the metformin twice daily and the Lantus 20 units daily.  You will take the Ozempic 0.5 once weekly.  If you have any questions or concerns please feel free to call the clinic I want you to follow-up in 1 month with your primary care provider to discuss your blood sugars.  Keep a log of your blood sugars.

## 2020-12-15 NOTE — Progress Notes (Signed)
    SUBJECTIVE:   CHIEF COMPLAINT / HPI:   Diabetes  Patient does not believe that her diabetes has been doing well over the past 3 months.  She has not been compliant with her medications because she has been out of these medications for over a month.  She reports frequent urination and feeling poor.  Sinusitis Patient reports 2-week history of congestion, rhinorrhea, sinus pain.  She reports she has had the symptoms in the past.  She states that she has been using over-the-counter Flonase as well as Zyrtec and has used a Nettie pot.  Reports that she is now coughing up thick mucus.  She is also having considerable head and sinus pain.  Denies any fever, GI symptoms, shortness of breath, chest pain.  OBJECTIVE:   BP 111/67   Pulse 69   Ht 5\' 2"  (1.575 m)   Wt 216 lb (98 kg)   SpO2 100%   BMI 39.51 kg/m   General: Ill-appearing 55 year old female, responds to questions appropriately HEENT: Considerable edema of nasal turbinates with rhinorrhea, posterior oropharynx with mild erythema as well Cardiac: Regular rate and rhythm, no murmurs appreciated Respiratory: Normal work of breathing, lungs are clear to auscultation bilaterally Abdomen: Soft, nontender, positive bowel sounds MSK: No gross abnormalities Neuro: Cranial nerves intact, extraocular eye movement intact, no slurred speech, no new weakness in upper or lower extremities  ASSESSMENT/PLAN:   No problem-specific Assessment & Plan notes found for this encounter.     Gifford Shave, MD [facility]

## 2020-12-16 NOTE — Assessment & Plan Note (Signed)
Patient reports poor compliance with medications.  She has not had her Ozempic or insulin in months.  She has been taking the metformin regularly.  Hemoglobin A1c today was 10.4 from 7.6 at previous check.  I have sent prescription to restart Ozempic at 0.5.  Also sent prescription for patient's Lantus 20 units daily.  Patient is going to follow-up with [place] for further titration of Ozempic.  She is also going to continue taking the metformin.  Repeat hemoglobin A1c in 3 months.  Counseled on the importance of medication adherence.

## 2020-12-16 NOTE — Assessment & Plan Note (Signed)
Signs and symptoms consistent with acute on chronic sinusitis.  Has been doing well since February of this year.  Has continued to use Flonase and Zyrtec.  Given the recurrence of the symptoms and her allergy to penicillin we will give prescription for doxycycline.  Patient will continue to use over-the-counter medications as well.  Strict ED and return precautions given.  No further questions or concerns.

## 2021-01-09 ENCOUNTER — Other Ambulatory Visit: Payer: Self-pay | Admitting: Family Medicine

## 2021-03-01 ENCOUNTER — Other Ambulatory Visit: Payer: Self-pay | Admitting: Family Medicine

## 2021-03-01 DIAGNOSIS — I10 Essential (primary) hypertension: Secondary | ICD-10-CM

## 2021-03-17 ENCOUNTER — Other Ambulatory Visit: Payer: Self-pay | Admitting: Family Medicine

## 2021-03-17 DIAGNOSIS — I10 Essential (primary) hypertension: Secondary | ICD-10-CM

## 2021-03-17 DIAGNOSIS — E785 Hyperlipidemia, unspecified: Secondary | ICD-10-CM

## 2021-03-24 ENCOUNTER — Telehealth: Payer: Self-pay

## 2021-03-24 ENCOUNTER — Encounter: Payer: Self-pay | Admitting: Family Medicine

## 2021-03-24 ENCOUNTER — Ambulatory Visit (INDEPENDENT_AMBULATORY_CARE_PROVIDER_SITE_OTHER): Payer: Medicare Other | Admitting: Family Medicine

## 2021-03-24 ENCOUNTER — Other Ambulatory Visit: Payer: Self-pay

## 2021-03-24 VITALS — BP 151/86 | HR 72 | Ht 62.0 in | Wt 217.2 lb

## 2021-03-24 DIAGNOSIS — R300 Dysuria: Secondary | ICD-10-CM | POA: Diagnosis not present

## 2021-03-24 DIAGNOSIS — E1165 Type 2 diabetes mellitus with hyperglycemia: Secondary | ICD-10-CM | POA: Diagnosis not present

## 2021-03-24 DIAGNOSIS — E7800 Pure hypercholesterolemia, unspecified: Secondary | ICD-10-CM | POA: Diagnosis not present

## 2021-03-24 DIAGNOSIS — I10 Essential (primary) hypertension: Secondary | ICD-10-CM

## 2021-03-24 DIAGNOSIS — G4733 Obstructive sleep apnea (adult) (pediatric): Secondary | ICD-10-CM

## 2021-03-24 DIAGNOSIS — N1832 Chronic kidney disease, stage 3b: Secondary | ICD-10-CM

## 2021-03-24 LAB — POCT URINALYSIS DIP (MANUAL ENTRY)
Bilirubin, UA: NEGATIVE
Glucose, UA: >=1000 mg/dL — AB
Ketones, POC UA: NEGATIVE mg/dL
Leukocytes, UA: NEGATIVE
Nitrite, UA: NEGATIVE
Protein Ur, POC: 100 mg/dL — AB
Spec Grav, UA: 1.015 (ref 1.010–1.025)
Urobilinogen, UA: 0.2 E.U./dL
pH, UA: 7 (ref 5.0–8.0)

## 2021-03-24 LAB — POCT GLYCOSYLATED HEMOGLOBIN (HGB A1C): HbA1c, POC (controlled diabetic range): 7.6 % — AB (ref 0.0–7.0)

## 2021-03-24 NOTE — Progress Notes (Signed)
0

## 2021-03-24 NOTE — Patient Instructions (Signed)
It was a pleasure to see you today!  We will get some labs today.  If they are abnormal or we need to do something about them, I will call you.  If they are normal, I will send you a message on MyChart (if it is active) or a letter in the mail.  If you don't hear from [place] in 2 weeks, please call the office  [phone]. If you have signs of a urine infection, I will call you and give you a prescription. Follow up in 3 months for your blood pressure and diabetes    Be Well,  Dr. Chauncey Reading

## 2021-03-24 NOTE — Telephone Encounter (Signed)
Patient calls nurse line regarding today's office visit. [employer] needs clinical notes supporting the need for CPAP machine.   Patient reports that she uses CPAP machine as directed and is doing well with no concerns.   Please advise if this can be documented inside today's office visit note.   This note can then be faxed to [employer] at [phone].  Talbot Grumbling, RN

## 2021-03-24 NOTE — Progress Notes (Signed)
° ° °  SUBJECTIVE:   CHIEF COMPLAINT / HPI:   DM2: Current regimen includes lantus 20U qAM, semaglutide 0.5 mg qweekly, metformin 1000 mg BID. She reports improvement in FBGs since starting ozempic. She previously tried jardiance and had yeast infections. Will check A1c and microalbumin today.   HTN: Current regimen amlodipine 10 mg qd, coreg 25 mg BID, will check metabolic panel today.  HLD: current regimen includes atorvastatin 80 mg. Will check LDL and CMP today.   Dysuria: Patient reports dysuria and back pain present for about a week. No fevers, no frank blood in her urine. Will check UA, obtain culture.  OSA: patient has OSA and sleeps with CPAP, she reports adherence to device and improved sleep, feels rested during the day.  PERTINENT  PMH / PSH: DM2, HLD, HTN  OBJECTIVE:   BP (!) 151/86    Pulse 72    Ht _0  (1.575 m)    Wt 217 lb 3.2 oz (98.5 kg)    SpO2 100%    BMI 39.73 kg/m   Nursing note and vitals reviewed GEN: age-appropriate, AAW, resting comfortably in chair, NAD, class II obesity Cardiac: Regular rate and rhythm. Normal S1/S2. No murmurs, rubs, or gallops appreciated. 2+ radial pulses. Back: no CVA tenderness Neuro: AOx3  Ext: no edema Psych: Pleasant and appropriate   ASSESSMENT/PLAN:   Obstructive sleep apnea Patient adherent with CPAP, continues to need device.  Hypercholesterolemia Repeat lipid panel shows continued improvement in total cholesterol (152), HDL appropriate (50), LDL is above target at 82 but close to goal <70. Currently on atorvastatin 80, recommend considering ezetimibe to decrease further. Will discuss with patient. CMP shows normal LFTs.  Dysuria UA showed + leuks, - nitrites. A1c controlled, will obtain urine culture prior to tx.  Diabetes mellitus type 2, uncontrolled A1c with huge improvement since starting ozempic, down from 10.4% to 7.6%. Continue current regimen, hoping for weight loss as well. Can increase ozempic if desired to  titrate more weight loss to treat DM2 at next visit.  Chronic kidney disease (CKD), stage III (moderate) Due to HTN and DM2. Patient is not on optimal medical therapy due to not tolerating SGLT2i preivously. However, now that patient has good A1c control, will recommend trialing SGLT2i again. Ur microalbumin 269, UA shows significant protein. Recommend close follow up with nephrology. Cr worsened, EGFR in 30s.     Gladys Damme, MD [place]

## 2021-03-25 LAB — COMPREHENSIVE METABOLIC PANEL
ALT: 16 IU/L (ref 0–32)
AST: 13 IU/L (ref 0–40)
Albumin/Globulin Ratio: 1.7 (ref 1.2–2.2)
Albumin: 4.2 g/dL (ref 3.8–4.9)
Alkaline Phosphatase: 80 IU/L (ref 44–121)
BUN/Creatinine Ratio: 7 — ABNORMAL LOW (ref 9–23)
BUN: 11 mg/dL (ref 6–24)
Bilirubin Total: 0.5 mg/dL (ref 0.0–1.2)
CO2: 18 mmol/L — ABNORMAL LOW (ref 20–29)
Calcium: 8.7 mg/dL (ref 8.7–10.2)
Chloride: 104 mmol/L (ref 96–106)
Creatinine, Ser: 1.48 mg/dL — ABNORMAL HIGH (ref 0.57–1.00)
Globulin, Total: 2.5 g/dL (ref 1.5–4.5)
Glucose: 326 mg/dL — ABNORMAL HIGH (ref 70–99)
Potassium: 3.6 mmol/L (ref 3.5–5.2)
Sodium: 143 mmol/L (ref 134–144)
Total Protein: 6.7 g/dL (ref 6.0–8.5)
eGFR: 41 mL/min/{1.73_m2} — ABNORMAL LOW (ref >59)

## 2021-03-25 LAB — LIPID PANEL
Chol/HDL Ratio: 3 ratio (ref 0.0–4.4)
Cholesterol, Total: 152 mg/dL (ref 100–199)
HDL: 50 mg/dL (ref >39)
LDL Chol Calc (NIH): 84 mg/dL (ref 0–99)
Triglycerides: 95 mg/dL (ref 0–149)
VLDL Cholesterol Cal: 18 mg/dL (ref 5–40)

## 2021-03-25 LAB — MICROALBUMIN / CREATININE URINE RATIO
Creatinine, Urine: 59.4 mg/dL
Microalb/Creat Ratio: 259 mg/g creat — ABNORMAL HIGH (ref 0–29)
Microalbumin, Urine: 153.7 ug/mL

## 2021-03-27 DIAGNOSIS — R300 Dysuria: Secondary | ICD-10-CM | POA: Insufficient documentation

## 2021-03-27 NOTE — Assessment & Plan Note (Signed)
Patient adherent with CPAP, continues to need device.

## 2021-03-27 NOTE — Assessment & Plan Note (Addendum)
Repeat lipid panel shows continued improvement in total cholesterol (152), HDL appropriate (50), LDL is above target at 82 but close to goal <70. Currently on atorvastatin 80, recommend considering ezetimibe to decrease further. Will discuss with patient. CMP shows normal LFTs.

## 2021-03-27 NOTE — Assessment & Plan Note (Signed)
UA showed + leuks, - nitrites. A1c controlled, will obtain urine culture prior to tx.

## 2021-03-27 NOTE — Assessment & Plan Note (Signed)
A1c with huge improvement since starting ozempic, down from 10.4% to 7.6%. Continue current regimen, hoping for weight loss as well. Can increase ozempic if desired to titrate more weight loss to treat DM2 at next visit.

## 2021-03-27 NOTE — Assessment & Plan Note (Addendum)
Due to HTN and DM2. Patient is not on optimal medical therapy due to not tolerating SGLT2i preivously. However, now that patient has good A1c control, will recommend trialing SGLT2i again. Ur microalbumin 269, UA shows significant protein. Recommend close follow up with nephrology. Cr worsened, EGFR in 30s.

## 2021-03-29 NOTE — Telephone Encounter (Signed)
Documentation completed, will fax.  Gladys Damme, MD [facility], PGY-3

## 2021-04-06 ENCOUNTER — Other Ambulatory Visit: Payer: Self-pay | Admitting: Family Medicine

## 2021-04-12 ENCOUNTER — Telehealth: Payer: Self-pay

## 2021-04-12 DIAGNOSIS — J0110 Acute frontal sinusitis, unspecified: Secondary | ICD-10-CM

## 2021-04-12 MED ORDER — DOXYCYCLINE HYCLATE 100 MG PO TABS: 100.0000 mg | ORAL_TABLET | Freq: Two times a day (BID) | ORAL | 0 refills | Status: DC

## 2021-04-12 NOTE — Telephone Encounter (Signed)
As patient has failed to improve in more than 10 days, will treat with abx. Due to PCN allergy, will treat with doxycycline 100 mg BID x 5 days. If no improvement, she will need to return for re-evaluation.  Gladys Damme, MD [facility], PGY-3

## 2021-04-12 NOTE — Telephone Encounter (Signed)
Patient calls nurse line regarding possible sinus infection. Patient states that she discussed symptoms at [facility] on 03/24/21, however, she thought she was supposed to be receiving an antibiotic.   Reports she is currently experiencing facial pain, sinus pressure and HA. Patient reports that she has been using OTC medications, however, symptoms are not improving.   If appropriate, please send rx to [facility] on [address].   Talbot Grumbling, RN

## 2021-04-14 NOTE — Telephone Encounter (Signed)
Spoke with patient. Informed of note below. Patient stated that she has already tried that and it doesn't help. She does have appt in [facility] on 04/15/21 at 9:30. Salvatore Marvel, CMA

## 2021-04-15 ENCOUNTER — Ambulatory Visit (INDEPENDENT_AMBULATORY_CARE_PROVIDER_SITE_OTHER): Payer: Medicare Other | Admitting: Family Medicine

## 2021-04-15 ENCOUNTER — Other Ambulatory Visit: Payer: Self-pay

## 2021-04-15 VITALS — BP 155/92 | HR 84 | Wt 220.2 lb

## 2021-04-15 DIAGNOSIS — J0100 Acute maxillary sinusitis, unspecified: Secondary | ICD-10-CM | POA: Diagnosis not present

## 2021-04-15 NOTE — Patient Instructions (Addendum)
It was great to meet you!  There are antibiotics at the pharmacy for you to pick up for a sinus infection. If you're still not improving after the antibiotics, let us know and we will look into other options.  In the meantime, continuing using Flonase daily. Avoid using Tylenol and Ibuprofen for the next few days because this may be causing "rebound headaches" since you've been taking it a lot recently.  Take care,  Dr. [facility]

## 2021-04-15 NOTE — Progress Notes (Signed)
° ° °  SUBJECTIVE:   CHIEF COMPLAINT / HPI:   Sinus Infection Patient concerned she has a sinus infection. She reports "my face hurts, my head hurts, my ears hurt" and it feels like she's "under water". She endorses nasal congestion but is unable to get anything out. Symptoms have been present for 2 weeks now. She is using Flonase multiple times per day, she tried a Neti pot per Dr Jerrol Banana recommendation, is taking Mucinex sinus, extra strength Tylenol, and using Vicks vaporub without relief. +slight cough. No true fever (highest she measured was 99.1). No GI symptoms, no sick contacts.  PERTINENT  PMH / PSH: T2DM, HTN, depression, tobacco use, COPD, CKD III  OBJECTIVE:   BP (!) 155/92    Pulse 84    Wt 220 lb 3.2 oz (99.9 kg)    SpO2 100%    BMI 40.28 kg/m   Gen: alert, able to participate in exam, NAD Head: [place]/AT, tenderness over maxillary and frontal sinuses Eyes: normal sclera and conjunctiva Ears: normal external canals. Unremarkable TMs bilaterally Nose: nasal turbinate hypertrophy noted bilaterally Resp: normal work of breathing   ASSESSMENT/PLAN:   Sinusitis, acute Presentation consistent with sinusitis. She has had recurrent episodes of sinusitis in the past and typically responds well to doxy (due to PCN allergy). -Informed patient that doxy has already been sent to pharmacy by PCP, Dr. Chauncey Reading. She will complete a 5 day course and return if no improvement -Continue flonase and supportive care with OTC meds as needed as well -Advised to decrease Tylenol/Ibuprofen use due to potential for rebound headache -Consider ENT referral if additional recurrences -Appears her depression could be better controlled and that may be playing a role here as well. Follow up w/PCP regarding mood.   HTN BP above goal today. Endorses compliance w/home meds. Follow up with PCP to discuss further.   Alcus Dad, MD [place]

## 2021-04-16 NOTE — Assessment & Plan Note (Signed)
Presentation consistent with sinusitis. She has had recurrent episodes of sinusitis in the past and typically responds well to doxy (due to PCN allergy). -Informed patient that doxy has already been sent to pharmacy by PCP, Dr. Chauncey Reading. She will complete a 5 day course and return if no improvement -Continue flonase and supportive care with OTC meds as needed as well -Advised to decrease Tylenol/Ibuprofen use due to potential for rebound headache -Consider ENT referral if additional recurrences -Appears her depression could be better controlled and that may be playing a role here as well. Follow up w/PCP regarding mood.

## 2021-05-09 ENCOUNTER — Other Ambulatory Visit: Payer: Self-pay | Admitting: Family Medicine

## 2021-05-18 ENCOUNTER — Other Ambulatory Visit: Payer: Self-pay | Admitting: Neurological Surgery

## 2021-05-18 DIAGNOSIS — M5416 Radiculopathy, lumbar region: Secondary | ICD-10-CM

## 2021-05-20 ENCOUNTER — Ambulatory Visit
Admission: RE | Admit: 2021-05-20 | Discharge: 2021-05-20 | Disposition: A | Discharge status: Home / Self Care | Payer: Medicare Other | Source: Ambulatory Visit | Attending: Neurological Surgery | Admitting: Neurological Surgery

## 2021-05-20 ENCOUNTER — Other Ambulatory Visit: Payer: Self-pay

## 2021-05-20 DIAGNOSIS — M5416 Radiculopathy, lumbar region: Secondary | ICD-10-CM

## 2021-05-20 MED ORDER — DIAZEPAM 5 MG PO TABS
10.0000 mg | ORAL_TABLET | Freq: Once | ORAL | Status: AC
  Administered 2021-05-20: 5 mg via ORAL

## 2021-05-20 MED ORDER — MEPERIDINE HCL 50 MG/ML IJ SOLN
50.0000 mg | Freq: Once | INTRAMUSCULAR | Status: AC | PRN
  Administered 2021-05-20: 50 mg via INTRAMUSCULAR

## 2021-05-20 MED ORDER — IOPAMIDOL (ISOVUE-M 200) INJECTION 41%
20.0000 mL | Freq: Once | INTRAMUSCULAR | Status: AC
  Administered 2021-05-20: 20 mL via INTRATHECAL

## 2021-05-20 MED ORDER — ONDANSETRON HCL 4 MG/2ML IJ SOLN
4.0000 mg | Freq: Once | INTRAMUSCULAR | Status: AC | PRN
  Administered 2021-05-20: 4 mg via INTRAMUSCULAR

## 2021-05-20 NOTE — Discharge Instr - Other Orders (Addendum)
8182: pt reports pain 10/10 from myelogram procedure. See MAR ?1022: Pt reports pain 5/10.  ?

## 2021-05-20 NOTE — Discharge Instructions (Signed)

## 2021-06-13 ENCOUNTER — Other Ambulatory Visit: Payer: Self-pay | Admitting: Family Medicine

## 2021-06-13 DIAGNOSIS — I10 Essential (primary) hypertension: Secondary | ICD-10-CM

## 2021-06-24 ENCOUNTER — Other Ambulatory Visit: Payer: Self-pay | Admitting: Family Medicine

## 2021-06-24 DIAGNOSIS — E1165 Type 2 diabetes mellitus with hyperglycemia: Secondary | ICD-10-CM

## 2021-09-10 ENCOUNTER — Other Ambulatory Visit: Payer: Self-pay | Admitting: Family Medicine

## 2021-09-10 DIAGNOSIS — I10 Essential (primary) hypertension: Secondary | ICD-10-CM

## 2021-09-14 ENCOUNTER — Other Ambulatory Visit: Payer: Self-pay | Admitting: Family Medicine

## 2021-09-14 DIAGNOSIS — I10 Essential (primary) hypertension: Secondary | ICD-10-CM

## 2021-09-14 DIAGNOSIS — E785 Hyperlipidemia, unspecified: Secondary | ICD-10-CM

## 2021-09-17 ENCOUNTER — Other Ambulatory Visit: Payer: Self-pay | Admitting: Family Medicine

## 2021-09-17 DIAGNOSIS — E1165 Type 2 diabetes mellitus with hyperglycemia: Secondary | ICD-10-CM

## 2021-09-17 NOTE — Telephone Encounter (Signed)
Attempted to call patient. Left generic voicemail stating that I have a question about her metformin refill. If patient calls back, please clarify metformin dose and strength.

## 2021-09-24 ENCOUNTER — Other Ambulatory Visit: Payer: Self-pay

## 2021-09-24 ENCOUNTER — Encounter (HOSPITAL_COMMUNITY): Payer: Self-pay

## 2021-09-24 ENCOUNTER — Inpatient Hospital Stay: Payer: Medicare Other | Attending: Physician Assistant

## 2021-09-24 ENCOUNTER — Other Ambulatory Visit: Payer: Self-pay | Admitting: Physician Assistant

## 2021-09-24 ENCOUNTER — Ambulatory Visit (HOSPITAL_COMMUNITY)
Admission: RE | Admit: 2021-09-24 | Discharge: 2021-09-24 | Disposition: A | Discharge status: Home / Self Care | Payer: Medicare Other | Source: Ambulatory Visit | Attending: Physician Assistant | Admitting: Physician Assistant

## 2021-09-24 DIAGNOSIS — C3492 Malignant neoplasm of unspecified part of left bronchus or lung: Secondary | ICD-10-CM

## 2021-09-24 LAB — CMP (CANCER CENTER ONLY)
ALT: 14 U/L (ref 0–44)
AST: 11 U/L — ABNORMAL LOW (ref 15–41)
Albumin: 4.5 g/dL (ref 3.5–5.0)
Alkaline Phosphatase: 61 U/L (ref 38–126)
Anion gap: 7 (ref 5–15)
BUN: 16 mg/dL (ref 6–20)
CO2: 29 mmol/L (ref 22–32)
Calcium: 9.7 mg/dL (ref 8.9–10.3)
Chloride: 102 mmol/L (ref 98–111)
Creatinine: 1.68 mg/dL — ABNORMAL HIGH (ref 0.44–1.00)
GFR, Estimated: 35 mL/min — ABNORMAL LOW (ref >60)
Glucose, Bld: 140 mg/dL — ABNORMAL HIGH (ref 70–99)
Potassium: 3.4 mmol/L — ABNORMAL LOW (ref 3.5–5.1)
Sodium: 138 mmol/L (ref 135–145)
Total Bilirubin: 1.2 mg/dL (ref 0.3–1.2)
Total Protein: 7.4 g/dL (ref 6.5–8.1)

## 2021-09-24 LAB — CBC WITH DIFFERENTIAL (CANCER CENTER ONLY)
Abs Immature Granulocytes: 0.02 10*3/uL (ref 0.00–0.07)
Basophils Absolute: 0 10*3/uL (ref 0.0–0.1)
Basophils Relative: 0 %
Eosinophils Absolute: 0.2 10*3/uL (ref 0.0–0.5)
Eosinophils Relative: 2 %
HCT: 48.4 % — ABNORMAL HIGH (ref 36.0–46.0)
Hemoglobin: 16.3 g/dL — ABNORMAL HIGH (ref 12.0–15.0)
Immature Granulocytes: 0 %
Lymphocytes Relative: 17 %
Lymphs Abs: 1.8 10*3/uL (ref 0.7–4.0)
MCH: 29.6 pg (ref 26.0–34.0)
MCHC: 33.7 g/dL (ref 30.0–36.0)
MCV: 87.8 fL (ref 80.0–100.0)
Monocytes Absolute: 0.8 10*3/uL (ref 0.1–1.0)
Monocytes Relative: 7 %
Neutro Abs: 7.9 10*3/uL — ABNORMAL HIGH (ref 1.7–7.7)
Neutrophils Relative %: 74 %
Platelet Count: 233 10*3/uL (ref 150–400)
RBC: 5.51 MIL/uL — ABNORMAL HIGH (ref 3.87–5.11)
RDW: 13.8 % (ref 11.5–15.5)
WBC Count: 10.7 10*3/uL — ABNORMAL HIGH (ref 4.0–10.5)
nRBC: 0 % (ref 0.0–0.2)

## 2021-09-25 ENCOUNTER — Other Ambulatory Visit: Payer: Self-pay | Admitting: Family Medicine

## 2021-09-25 DIAGNOSIS — E1165 Type 2 diabetes mellitus with hyperglycemia: Secondary | ICD-10-CM

## 2021-09-26 NOTE — Progress Notes (Deleted)
Theresa [name], [name], MD [place] [address]  DIAGNOSIS: Recurrent non-small cell lung cancer initially diagnosed as stage IA (T1b, N0, MX) adenocarcinoma in November 2009. She had evidence of recurrent disease in September 2012.    PRIOR THERAPY: #1 Status post left upper lobe trisegmentectomy with lymph node dissection under the care of Dr. Arlyce Dice on March 24, 2009. #2 Concurrent chemoradiation with carboplatin for AUC of 2 and paclitaxel 45 mg/M2 given weekly with radiation in 2012.     CURRENT THERAPY: Observation  INTERVAL HISTORY: Theresa [name] 56 y.o. female returns to the clinic today for a follow up visit. The patient was last seen in the clinic on 09/2020.  The patient is currently on observation for her history non-small cell lung cancer in the left upper lobe.  There is also a nodule in the right lower lobe that we have been monitoring.  The patient has had PET scans which does not show significant FDG avidity although remains worrisome for an indolent neoplasm.  Since last being seen the patient denies any new concerning complaints.  She denies any fever, chills, night sweats, or unexplained weight loss.  She denies any chest pain, shortness of breath, cough, or hemoptysis.  Smoking 1 pack a day?  COPD managed by her PCP?  She denies any nausea, vomiting, diarrhea, or constipation.  Denies any headache or visual changes.  She recently had a restaging CT scan performed.  She is here today for evaluation to review her scan results.      MEDICAL HISTORY: Past Medical History:  Diagnosis Date   Anxiety    Arthritis    Blindness, legal    glaucoma and retinitis  pigmentosa   Chronic eczema    Chronic headaches 10-17-11   not migraines- just regular   Constipation    Depression    Diabetes mellitus type II    diet    Family history of anesthesia complication    1 son had malignant hyperthermia at 55yrs  tonsils  rained   Family history of malignant hyperthermia    1 son 16 years ago   Fibromyalgia    denies, has not been diagnosited   GERD (gastroesophageal reflux disease)    Glaucoma    History of chemotherapy 01/2011 to 03/2011   concurrent w/radiation therapy   Hx of radiation therapy 01-17-11 to 03-18-11   lung   Hyperlipidemia    Hypertension    Lung cancer ([place])    lung ca dx 2010   Malignant hyperthermia    As of 12/30/15, no personal MH history, but reported her son has a history of MH ~ 2000 (no confirmatory testing done)   Onychomycosis    Retinitis pigmentosa    Shortness of breath    with exertion   Sleep apnea    cpap 5 yrs    ALLERGIES:  is allergic to penicillins, ibuprofen, codeine, morphine and related, and tomato.  MEDICATIONS:  Current Outpatient Medications  Medication Sig Dispense Refill   albuterol (PROAIR HFA) 108 (90 Base) MCG/ACT inhaler Inhale 2 puffs into the lungs every 4 (four) hours as needed for wheezing or shortness of breath. 8 g 3   albuterol (PROVENTIL) (2.5 MG/3ML) 0.083% nebulizer solution Take 6 mLs (5 mg total) by nebulization every 6 (six) hours as needed for wheezing or shortness of breath. 150 mL 1   amLODipine (NORVASC) 10 MG tablet TAKE 1 TABLET (10 MG TOTAL)  BY MOUTH DAILY. MAKE APPT FOR FUTURE REFILLS. 90 tablet 0   aspirin EC 81 MG tablet Take 1 tablet (81 mg total) by mouth daily. 30 tablet 11   atorvastatin (LIPITOR) 80 MG tablet TAKE 1 TABLET BY MOUTH EVERY DAY 90 tablet 3   carvedilol (COREG) 25 MG tablet TAKE 1 TABLET (25 MG TOTAL) BY MOUTH TWICE A DAY WITH MEALS 180 tablet 1   clobetasol cream (TEMOVATE) 6.57 % Apply 1 application topically 2 (two) times daily. 60 g 1   doxycycline (VIBRA-TABS) 100 MG tablet Take 1 tablet (100 mg total) by mouth 2 (two) times daily. 10 tablet 0   DULoxetine (CYMBALTA) 60 MG capsule NEEDS APPT FOR FUTURE REFILLS. TAKE 1 CAPSULE BY MOUTH EVERY DAY 90 capsule 2   fluticasone (FLONASE) 50 MCG/ACT nasal spray  SPRAY 2 SPRAYS INTO EACH NOSTRIL EVERY DAY 48 mL 2   gabapentin (NEURONTIN) 300 MG capsule TAKE 1 CAPSULE BY MOUTH THREE TIMES A DAY 270 capsule 1   insulin glargine (LANTUS) 100 UNIT/ML Solostar Pen Inject 20 Units into the skin every morning. 3 mL 11   Insulin Pen Needle (PEN NEEDLES) 32G X 4 MM MISC 1 Stick by Does not apply route daily. 100 each 3   linaclotide (LINZESS) 290 MCG CAPS capsule TAKE 1 CAPSULE (290 MCG TOTAL) BY MOUTH DAILY. 90 capsule 3   metFORMIN (GLUCOPHAGE) 1000 MG tablet TAKE 1 TABLET (1,000 MG TOTAL) BY MOUTH 2 (TWO) TIMES DAILY WITH A MEAL. 180 tablet 0   metFORMIN (GLUCOPHAGE) 500 MG tablet Take 2 tablets (1,000 mg total) by mouth 2 (two) times daily with a meal. 120 tablet 6   montelukast (SINGULAIR) 10 MG tablet Take 1 tablet (10 mg total) by mouth at bedtime. 90 tablet 3   omeprazole (PRILOSEC) 20 MG capsule TAKE 1 CAPSULE BY MOUTH EVERY DAY 15 capsule 0   oxymetazoline (AFRIN NASAL SPRAY) 0.05 % nasal spray Place 1 spray into both nostrils 2 (two) times daily. 30 mL 0   Semaglutide,0.25 or 0.5MG /DOS, (OZEMPIC, 0.25 OR 0.5 MG/DOSE,) 2 MG/1.5ML SOPN Inject 0.5 mg into the skin once a week. 3 mL 3   Semaglutide,0.25 or 0.5MG /DOS, (OZEMPIC, 0.25 OR 0.5 MG/DOSE,) 2 MG/1.5ML SOPN Inject 0.5 mg into the skin once a week. 3 mL 3   timolol (TIMOPTIC) 0.5 % ophthalmic solution Place 1 drop into both eyes 2 (two) times daily.     Travoprost, BAK Free, (TRAVATAN) 0.004 % SOLN ophthalmic solution Place 1 drop into both eyes at bedtime.     umeclidinium-vilanterol (ANORO ELLIPTA) 62.5-25 MCG/INH AEPB Inhale 1 puff into the lungs daily. 1 each 0   No current facility-administered medications for this visit.    SURGICAL HISTORY:  Past Surgical History:  Procedure Laterality Date   ABDOMINAL HYSTERECTOMY  09/19/2001   hx. of fibroids. TAH. Ovaries remain.    BACK SURGERY  06/2013   CHEST TUBE INSERTION   04/01/08   L hydropneumothorax   EYE SURGERY Bilateral    lens implant    KNEE ARTHROSCOPY  10-17-11   bil. knee scope(left was torn ligament)   LUMBAR LAMINECTOMY/DECOMPRESSION MICRODISCECTOMY  10/27/2011   Procedure: LUMBAR LAMINECTOMY/DECOMPRESSION MICRODISCECTOMY;  Surgeon: Johnn Hai, MD;  Location: [facility];  Service: Orthopedics;  Laterality: N/A;  L4-L5   lung surgery  03/24/08   L vats, L thoracotomy and LUL trisegmentectomy with node dissection   MAXIMUM ACCESS (MAS)POSTERIOR LUMBAR INTERBODY FUSION (PLIF) 1 LEVEL N/A 07/03/2013   Procedure: FOR MAXIMUM  ACCESS (MAS) POSTERIOR LUMBAR INTERBODY FUSION (PLIF) 1 LEVEL;  Surgeon: Eustace Moore, MD;  Location: [facility];  Service: Neurosurgery;  Laterality: N/A;  FOR MAXIMUM ACCESS (MAS) POSTERIOR LUMBAR INTERBODY FUSION (PLIF) 1 LEVEL (L4-L5)   PORT-A-CATH REMOVAL Right 11/26/2013   Procedure: REMOVAL PORT-A-CATH;  Surgeon: Gaye Pollack, MD;  Location: [facility];  Service: Thoracic;  Laterality: Right;   PORTACATH PLACEMENT  02/11/2011   Procedure: INSERTION PORT-A-CATH;  Surgeon: Pierre Bali, MD;  Location: [facility];  Service: Thoracic;  Laterality: Right;  9.6Fr. Pre-attached Power Port in Right Internal Jugular  -right chest-remains inplace 10-17-11   SPINAL CORD STIMULATOR INSERTION N/A 02/03/2016   Procedure: LUMBAR SPINAL CORD STIMULATOR INSERTION;  Surgeon: Eustace Moore, MD;  Location: [facility];  Service: Neurosurgery;  Laterality: N/A;    REVIEW OF SYSTEMS:   Review of Systems  Constitutional: Negative for appetite change, chills, fatigue, fever and unexpected weight change.  HENT:   Negative for mouth sores, nosebleeds, sore throat and trouble swallowing.   Eyes: Negative for eye problems and icterus.  Respiratory: Negative for cough, hemoptysis, shortness of breath and wheezing.   Cardiovascular: Negative for chest pain and leg swelling.  Gastrointestinal: Negative for abdominal pain, constipation, diarrhea, nausea and vomiting.  Genitourinary: Negative for bladder incontinence, difficulty urinating,  dysuria, frequency and hematuria.   Musculoskeletal: Negative for back pain, gait problem, neck pain and neck stiffness.  Skin: Negative for itching and rash.  Neurological: Negative for dizziness, extremity weakness, gait problem, headaches, light-headedness and seizures.  Hematological: Negative for adenopathy. Does not bruise/bleed easily.  Psychiatric/Behavioral: Negative for confusion, depression and sleep disturbance. The patient is not nervous/anxious.     PHYSICAL EXAMINATION:  There were no vitals taken for this visit.  ECOG PERFORMANCE STATUS: {CHL ONC ECOG [id number]  Physical Exam  Constitutional: Oriented to person, place, and time and well-developed, well-nourished, and in no distress. No distress.  HENT:  Head: Normocephalic and atraumatic.  Mouth/Throat: Oropharynx is clear and moist. No oropharyngeal exudate.  Eyes: Conjunctivae are normal. Right eye exhibits no discharge. Left eye exhibits no discharge. No scleral icterus.  Neck: Normal range of motion. Neck supple.  Cardiovascular: Normal rate, regular rhythm, normal heart sounds and intact distal pulses.   Pulmonary/Chest: Effort normal and breath sounds normal. No respiratory distress. No wheezes. No rales.  Abdominal: Soft. Bowel sounds are normal. Exhibits no distension and no mass. There is no tenderness.  Musculoskeletal: Normal range of motion. Exhibits no edema.  Lymphadenopathy:    No cervical adenopathy.  Neurological: Alert and oriented to person, place, and time. Exhibits normal muscle tone. Gait normal. Coordination normal.  Skin: Skin is warm and dry. No rash noted. Not diaphoretic. No erythema. No pallor.  Psychiatric: Mood, memory and judgment normal.  Vitals reviewed.  LABORATORY DATA: Lab Results  Component Value Date   WBC 10.7 (H) 09/24/2021   HGB 16.3 (H) 09/24/2021   HCT 48.4 (H) 09/24/2021   MCV 87.8 09/24/2021   PLT 233 09/24/2021      Chemistry      Component Value Date/Time    NA 138 09/24/2021 1044   NA 143 03/24/2021 1209   NA 139 02/06/2015 0901   K 3.4 (L) 09/24/2021 1044   K 3.9 02/06/2015 0901   CL 102 09/24/2021 1044   CL 102 01/18/2012 0805   CO2 29 09/24/2021 1044   CO2 28 02/06/2015 0901   BUN 16 09/24/2021 1044   BUN 11 03/24/2021 1209  BUN 10.8 02/06/2015 0901   CREATININE 1.68 (H) 09/24/2021 1044   CREATININE 1.5 (H) 02/06/2015 0901      Component Value Date/Time   CALCIUM 9.7 09/24/2021 1044   CALCIUM 9.6 02/06/2015 0901   ALKPHOS 61 09/24/2021 1044   ALKPHOS 71 02/06/2015 0901   AST 11 (L) 09/24/2021 1044   AST 12 02/06/2015 0901   ALT 14 09/24/2021 1044   ALT 16 02/06/2015 0901   BILITOT 1.2 09/24/2021 1044   BILITOT 0.87 02/06/2015 0901       RADIOGRAPHIC STUDIES:  CT CHEST WO CONTRAST  Result Date: 09/26/2021 CLINICAL DATA:  Non-small cell lung cancer, chemotherapy and XRT complete * Tracking Code: BO * EXAM: CT CHEST WITHOUT CONTRAST TECHNIQUE: Multidetector CT imaging of the chest was performed following the standard protocol without IV contrast. RADIATION DOSE REDUCTION: This exam was performed according to the departmental dose-optimization program which includes automated exposure control, adjustment of the mA and/or kV according to patient size and/or use of iterative reconstruction technique. COMPARISON:  09/25/2020, 03/24/2020, 08/14/2019, 03/01/2016 FINDINGS: Cardiovascular: Aortic atherosclerosis. Normal heart size. Trace pericardial effusion. Mediastinum/Nodes: No enlarged mediastinal, hilar, or axillary lymph nodes. Thyroid gland, trachea, and esophagus demonstrate no significant findings. Lungs/Pleura: Unchanged postoperative and post treatment appearance of the paramedian left upper lobe, with presumed lingular segmentectomy and perihilar/suprahilar fibrosis and consolidation. Lobulated nodule of the superior segment right lower lobe has very slightly increased in size, measuring 1.5 x 1.1 cm, previously 1.4 x 0.9 cm  when remeasured with similar technique on prior examination (series 7, image 69). A previously seen small satellite nodule just posterior to this nodule has however resolved (series 7, image 72). Unchanged subtle ground-glass nodule of the right upper lobe measuring 0.6 cm, stable and benign (series 7, image 66). Background of very fine centrilobular pulmonary nodularity, most concentrated in the right apex. No pleural effusion or pneumothorax. Upper Abdomen: No acute abnormality. Multiple low-attenuation lesions of the kidneys, incompletely characterized by noncontrast CT but most likely simple, benign renal cortical cysts. Musculoskeletal: No chest wall abnormality. No suspicious osseous lesions identified. IMPRESSION: 1. Unchanged postoperative and post treatment appearance of the paramedian left upper lobe, with presumed lingular segmentectomy and postradiation perihilar/suprahilar fibrosis and consolidation. 2. Lobulated nodule of the superior segment right lower lobe has very slightly increased in size, measuring 1.5 x 1.1 cm, previously 1.4 x 0.9 cm when remeasured with similar technique on prior examination. This nodule has clearly enlarged very gradually over time, apparent as early as examination dated 03/01/2016. 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 3. A previously seen small satellite nodule just posterior to the above described nodule has however resolved, presumed resolution of nonspecific infection or inflammation. 4. Background of very fine centrilobular pulmonary nodularity, most concentrated in the right apex, most commonly seen in smoking-related respiratory bronchiolitis. Aortic Atherosclerosis (ICD10-I70.0). Electronically Signed   By: Delanna Ahmadi M.D.   On: 09/26/2021 16:17     ASSESSMENT/PLAN:  This is a very pleasant 56 year old African-American  female with a history of non-small cell lung cancer, adenocarcinoma.  She was initially diagnosed in 2009.  She underwent a left trisegmentectomy under the care of Dr. Arlyce Dice.  She had evidence of recurrent disease in recurrence in 2012 and she underwent a course of concurrent chemoradiation.  In early June 2021 the patient was found to have suspicious right lower lobe pulmonary nodules concerning for disease  metastasis versus metachronous primary.  She had a PET scan which showed no hypermetabolic activity in either the new right lower lobe pulmonary nodule which is reassuring but low-grade or well differentiated neoplasm cannot be completely excluded.  The patient recently had a restaging CT scan performed.  Dr. Julien Nordmann personally and independently reviewed the scan and discussed the results with the patient today.  The scan showed no evidence of disease progression except for slight interval increase in the *** which remains suspicious for ***.   Dr. Julien Nordmann recommends ***.   I will arrange for.***  We will see her back for follow-up visit in ***2 ***  The patient is strongly encouraged to quit smoking.  The patient was advised to call immediately if she has any concerning symptoms in the interval. The patient voices understanding of current disease status and treatment options and is in agreement with the current care plan. All questions were answered. The patient knows to call the clinic with any problems, questions or concerns. We can certainly see the patient much sooner if necessary         No orders of the defined types were placed in this encounter.    I spent {CHL ONC TIME VISIT - BOFBP:[id number]} counseling the patient face to face. The total time spent in the appointment was {CHL ONC TIME VISIT - EUMPN:[id number]}.  Chandon Lazcano L Grayton Lobo, PA-C 09/26/21

## 2021-09-27 ENCOUNTER — Other Ambulatory Visit: Payer: Medicare Other

## 2021-09-29 ENCOUNTER — Inpatient Hospital Stay: Payer: Medicare Other | Admitting: Physician Assistant

## 2021-09-29 NOTE — Progress Notes (Signed)
Arnegard, [name], MD [place] [address]  DIAGNOSIS: Recurrent non-small cell lung cancer initially diagnosed as stage IA (T1b, N0, MX) adenocarcinoma in November 2009. She had evidence of recurrent disease in September 2012.    PRIOR THERAPY: #1 Status post left upper lobe trisegmentectomy with lymph node dissection under the care of Dr. Arlyce Dice on March 24, 2009. #2 Concurrent chemoradiation with carboplatin for AUC of 2 and paclitaxel 45 mg/M2 given weekly with radiation in 2012.     CURRENT THERAPY: Observation  INTERVAL HISTORY: to the clinic today for a follow-up visit accompanied by her daughter.  The patient was last seen in July 2022.  The patient is currently on observation for a history of non-small cell lung cancer. We are monitoring a suspicious right lower lobe pulmonary nodule. She previously had a PET scan which did not show FDG avidity but remains suspicious for indolent neoplasm. She denies any recent fever, chills, night sweats, or unexplained weight loss. Regarding her breathing, the patient continues to smoke 1 pack of cigarettes per day. She reports her breathing is not great. She has some occasional wheezing, dyspnea, and cough which has worsened since last being seen. Her COPD is managed by her PCP but she reports she has not seen them in awhile. She would be interested in seeing pulmonology for management of her COPD. She reports she takes mucinex for her phlegm production. She states that she sometimes feels like she is "drowning" in phlegm. She reports occasional right lower chest discomfort. She sometimes has nausea which she attributes to something she has ate. She denies any headaches. 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 The patient recently had a restaging CT scan performed.  The patient is here today for  evaluation to review her scan results. Her daughter is wondering if we would fill out FMLA for all of her mother's doctors appointments.    MEDICAL HISTORY: Past Medical History:  Diagnosis Date   Anxiety    Arthritis    Blindness, legal    glaucoma and retinitis  pigmentosa   Chronic eczema    Chronic headaches 10-17-11   not migraines- just regular   Constipation    Depression    Diabetes mellitus type II    diet    Family history of anesthesia complication    1 son had malignant hyperthermia at 25yrs  tonsils rained   Family history of malignant hyperthermia    1 son 16 years ago   Fibromyalgia    denies, has not been diagnosited   GERD (gastroesophageal reflux disease)    Glaucoma    History of chemotherapy 01/2011 to 03/2011   concurrent w/radiation therapy   Hx of radiation therapy 01-17-11 to 03-18-11   lung   Hyperlipidemia    Hypertension    Lung cancer ([place])    lung ca dx 2010   Malignant hyperthermia    As of 12/30/15, no personal MH history, but reported her son has a history of MH ~ 2000 (no confirmatory testing done)   Onychomycosis    Retinitis pigmentosa    Shortness of breath    with exertion   Sleep apnea    cpap 5 yrs    ALLERGIES:  is allergic to penicillins, ibuprofen, codeine, morphine and related, and tomato.  MEDICATIONS:  Current Outpatient Medications  Medication  Sig Dispense Refill   albuterol (PROAIR HFA) 108 (90 Base) MCG/ACT inhaler Inhale 2 puffs into the lungs every 4 (four) hours as needed for wheezing or shortness of breath. 8 g 3   albuterol (PROVENTIL) (2.5 MG/3ML) 0.083% nebulizer solution Take 6 mLs (5 mg total) by nebulization every 6 (six) hours as needed for wheezing or shortness of breath. 150 mL 1   amLODipine (NORVASC) 10 MG tablet TAKE 1 TABLET (10 MG TOTAL) BY MOUTH DAILY. MAKE APPT FOR FUTURE REFILLS. 90 tablet 0   aspirin EC 81 MG tablet Take 1 tablet (81 mg total) by mouth daily. 30 tablet 11   atorvastatin (LIPITOR) 80 MG  tablet TAKE 1 TABLET BY MOUTH EVERY DAY 90 tablet 3   baclofen (LIORESAL) 10 MG tablet Oral for 90 Days     carvedilol (COREG) 25 MG tablet TAKE 1 TABLET (25 MG TOTAL) BY MOUTH TWICE A DAY WITH MEALS 180 tablet 1   clobetasol cream (TEMOVATE) 8.67 % Apply 1 application topically 2 (two) times daily. 60 g 1   doxycycline (VIBRA-TABS) 100 MG tablet Take 1 tablet (100 mg total) by mouth 2 (two) times daily. 10 tablet 0   DULoxetine (CYMBALTA) 60 MG capsule NEEDS APPT FOR FUTURE REFILLS. TAKE 1 CAPSULE BY MOUTH EVERY DAY 90 capsule 2   fluticasone (FLONASE) 50 MCG/ACT nasal spray SPRAY 2 SPRAYS INTO EACH NOSTRIL EVERY DAY 48 mL 2   gabapentin (NEURONTIN) 300 MG capsule TAKE 1 CAPSULE BY MOUTH THREE TIMES A DAY 270 capsule 1   insulin glargine (LANTUS) 100 UNIT/ML Solostar Pen Inject 20 Units into the skin every morning. 3 mL 11   Insulin Pen Needle (PEN NEEDLES) 32G X 4 MM MISC 1 Stick by Does not apply route daily. 100 each 3   linaclotide (LINZESS) 290 MCG CAPS capsule TAKE 1 CAPSULE (290 MCG TOTAL) BY MOUTH DAILY. 90 capsule 3   metFORMIN (GLUCOPHAGE) 1000 MG tablet TAKE 1 TABLET (1,000 MG TOTAL) BY MOUTH TWICE A DAY WITH FOOD 180 tablet 0   metFORMIN (GLUCOPHAGE) 500 MG tablet Take 2 tablets (1,000 mg total) by mouth 2 (two) times daily with a meal. 120 tablet 6   montelukast (SINGULAIR) 10 MG tablet Take 1 tablet (10 mg total) by mouth at bedtime. 90 tablet 3   omeprazole (PRILOSEC) 20 MG capsule TAKE 1 CAPSULE BY MOUTH EVERY DAY 15 capsule 0   oxymetazoline (AFRIN NASAL SPRAY) 0.05 % nasal spray Place 1 spray into both nostrils 2 (two) times daily. 30 mL 0   Semaglutide,0.25 or 0.5MG /DOS, (OZEMPIC, 0.25 OR 0.5 MG/DOSE,) 2 MG/1.5ML SOPN Inject 0.5 mg into the skin once a week. 3 mL 3   Semaglutide,0.25 or 0.5MG /DOS, (OZEMPIC, 0.25 OR 0.5 MG/DOSE,) 2 MG/1.5ML SOPN Inject 0.5 mg into the skin once a week. 3 mL 3   timolol (TIMOPTIC) 0.5 % ophthalmic solution Place 1 drop into both eyes 2 (two) times  daily.     Travoprost, BAK Free, (TRAVATAN) 0.004 % SOLN ophthalmic solution Place 1 drop into both eyes at bedtime.     umeclidinium-vilanterol (ANORO ELLIPTA) 62.5-25 MCG/INH AEPB Inhale 1 puff into the lungs daily. 1 each 0   varenicline (CHANTIX CONTINUING MONTH PAK) 1 MG tablet Take by mouth.     No current facility-administered medications for this visit.    SURGICAL HISTORY:  Past Surgical History:  Procedure Laterality Date   ABDOMINAL HYSTERECTOMY  09/19/2001   hx. of fibroids. TAH. Ovaries remain.    BACK SURGERY  06/2013   CHEST TUBE INSERTION   04/01/08   L hydropneumothorax   EYE SURGERY Bilateral    lens implant   KNEE ARTHROSCOPY  10-17-11   bil. knee scope(left was torn ligament)   LUMBAR LAMINECTOMY/DECOMPRESSION MICRODISCECTOMY  10/27/2011   Procedure: LUMBAR LAMINECTOMY/DECOMPRESSION MICRODISCECTOMY;  Surgeon: Johnn Hai, MD;  Location: [facility];  Service: Orthopedics;  Laterality: N/A;  L4-L5   lung surgery  03/24/08   L vats, L thoracotomy and LUL trisegmentectomy with node dissection   MAXIMUM ACCESS (MAS)POSTERIOR LUMBAR INTERBODY FUSION (PLIF) 1 LEVEL N/A 07/03/2013   Procedure: FOR MAXIMUM ACCESS (MAS) POSTERIOR LUMBAR INTERBODY FUSION (PLIF) 1 LEVEL;  Surgeon: Eustace Moore, MD;  Location: [facility];  Service: Neurosurgery;  Laterality: N/A;  FOR MAXIMUM ACCESS (MAS) POSTERIOR LUMBAR INTERBODY FUSION (PLIF) 1 LEVEL (L4-L5)   PORT-A-CATH REMOVAL Right 11/26/2013   Procedure: REMOVAL PORT-A-CATH;  Surgeon: Gaye Pollack, MD;  Location: [facility];  Service: Thoracic;  Laterality: Right;   PORTACATH PLACEMENT  02/11/2011   Procedure: INSERTION PORT-A-CATH;  Surgeon: Pierre Bali, MD;  Location: [facility];  Service: Thoracic;  Laterality: Right;  9.6Fr. Pre-attached Power Port in Right Internal Jugular  -right chest-remains inplace 10-17-11   SPINAL CORD STIMULATOR INSERTION N/A 02/03/2016   Procedure: LUMBAR SPINAL CORD STIMULATOR INSERTION;  Surgeon: Eustace Moore, MD;   Location: [facility];  Service: Neurosurgery;  Laterality: N/A;    REVIEW OF SYSTEMS:   Review of Systems  Constitutional: Positive for fatigue. Negative for appetite change, chills, fever and unexpected weight change.  HENT:   Negative for mouth sores, nosebleeds, sore throat and trouble swallowing.   Eyes: Negative for eye problems and icterus.  Respiratory: Positive for cough, shortness of breath, and wheezing. Negative for hemoptysis.  Cardiovascular: Positive for right lower chest discomfort. Negative for leg swelling.  Gastrointestinal: Negative for abdominal pain, constipation, diarrhea, nausea and vomiting.  Genitourinary: Negative for bladder incontinence, difficulty urinating, dysuria, frequency and hematuria.   Musculoskeletal: Negative for back pain, gait problem, neck pain and neck stiffness.  Skin: Negative for itching and rash.  Neurological: Negative for dizziness, extremity weakness, gait problem, headaches, light-headedness and seizures.  Hematological: Negative for adenopathy. Does not bruise/bleed easily.  Psychiatric/Behavioral: Negative for confusion, depression and sleep disturbance. The patient is not nervous/anxious.     PHYSICAL EXAMINATION:  Blood pressure (!) 147/79, pulse 73, temperature 98.1 F (36.7 C), temperature source Temporal, resp. rate 18, height 5\' 2"  (1.575 m), weight 213 lb 14.4 oz (97 kg), SpO2 98 %.  ECOG PERFORMANCE STATUS: 1  Physical Exam  Constitutional: Oriented to person, place, and time and well-developed, well-nourished, and in no distress.  HENT:  Head: Normocephalic and atraumatic.  Mouth/Throat: Oropharynx is clear and moist. No oropharyngeal exudate.  Eyes: Conjunctivae are normal. Right eye exhibits no discharge. Left eye exhibits no discharge. No scleral icterus.  Neck: Normal range of motion. Neck supple.  Cardiovascular: Normal rate, regular rhythm, normal heart sounds and intact distal pulses.   Pulmonary/Chest: Effort normal.  Quiet breath sounds bilaterally. No respiratory distress. No wheezes. No rales.  Abdominal: Soft. Bowel sounds are normal. Exhibits no distension and no mass. There is no tenderness.  Musculoskeletal: Normal range of motion. Exhibits no edema.  Lymphadenopathy:    No cervical adenopathy.  Neurological: Alert and oriented to person, place, and time. Exhibits normal muscle tone.Examined in the wheelchair.  Skin: Skin is warm and dry. No rash noted. Not diaphoretic. No erythema. No pallor.  Psychiatric: Mood, memory and judgment normal.  Vitals reviewed.  LABORATORY DATA: Lab Results  Component Value Date   WBC 10.7 (H) 09/24/2021   HGB 16.3 (H) 09/24/2021   HCT 48.4 (H) 09/24/2021   MCV 87.8 09/24/2021   PLT 233 09/24/2021      Chemistry      Component Value Date/Time   NA 138 09/24/2021 1044   NA 143 03/24/2021 1209   NA 139 02/06/2015 0901   K 3.4 (L) 09/24/2021 1044   K 3.9 02/06/2015 0901   CL 102 09/24/2021 1044   CL 102 01/18/2012 0805   CO2 29 09/24/2021 1044   CO2 28 02/06/2015 0901   BUN 16 09/24/2021 1044   BUN 11 03/24/2021 1209   BUN 10.8 02/06/2015 0901   CREATININE 1.68 (H) 09/24/2021 1044   CREATININE 1.5 (H) 02/06/2015 0901      Component Value Date/Time   CALCIUM 9.7 09/24/2021 1044   CALCIUM 9.6 02/06/2015 0901   ALKPHOS 61 09/24/2021 1044   ALKPHOS 71 02/06/2015 0901   AST 11 (L) 09/24/2021 1044   AST 12 02/06/2015 0901   ALT 14 09/24/2021 1044   ALT 16 02/06/2015 0901   BILITOT 1.2 09/24/2021 1044   BILITOT 0.87 02/06/2015 0901       RADIOGRAPHIC STUDIES:  CT CHEST WO CONTRAST  Result Date: 09/26/2021 CLINICAL DATA:  Non-small cell lung cancer, chemotherapy and XRT complete * Tracking Code: BO * EXAM: CT CHEST WITHOUT CONTRAST TECHNIQUE: Multidetector CT imaging of the chest was performed following the standard protocol without IV contrast. RADIATION DOSE REDUCTION: This exam was performed according to the departmental dose-optimization  program which includes automated exposure control, adjustment of the mA and/or kV according to patient size and/or use of iterative reconstruction technique. COMPARISON:  09/25/2020, 03/24/2020, 08/14/2019, 03/01/2016 FINDINGS: Cardiovascular: Aortic atherosclerosis. Normal heart size. Trace pericardial effusion. Mediastinum/Nodes: No enlarged mediastinal, hilar, or axillary lymph nodes. Thyroid gland, trachea, and esophagus demonstrate no significant findings. Lungs/Pleura: Unchanged postoperative and post treatment appearance of the paramedian left upper lobe, with presumed lingular segmentectomy and perihilar/suprahilar fibrosis and consolidation. Lobulated nodule of the superior segment right lower lobe has very slightly increased in size, measuring 1.5 x 1.1 cm, previously 1.4 x 0.9 cm when remeasured with similar technique on prior examination (series 7, image 69). A previously seen small satellite nodule just posterior to this nodule has however resolved (series 7, image 72). Unchanged subtle ground-glass nodule of the right upper lobe measuring 0.6 cm, stable and benign (series 7, image 66). Background of very fine centrilobular pulmonary nodularity, most concentrated in the right apex. No pleural effusion or pneumothorax. Upper Abdomen: No acute abnormality. Multiple low-attenuation lesions of the kidneys, incompletely characterized by noncontrast CT but most likely simple, benign renal cortical cysts. Musculoskeletal: No chest wall abnormality. No suspicious osseous lesions identified. IMPRESSION: 1. Unchanged postoperative and post treatment appearance of the paramedian left upper lobe, with presumed lingular segmentectomy and postradiation perihilar/suprahilar fibrosis and consolidation. 2. Lobulated nodule of the superior segment right lower lobe has very slightly increased in size, measuring 1.5 x 1.1 cm, previously 1.4 x 0.9 cm when remeasured with similar technique on prior examination. This nodule  has clearly enlarged very gradually over time, apparent as early as examination dated 03/01/2016. 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 3. A previously seen small satellite nodule just posterior to the above described nodule  has however resolved, presumed resolution of nonspecific infection or inflammation. 4. Background of very fine centrilobular pulmonary nodularity, most concentrated in the right apex, most commonly seen in smoking-related respiratory bronchiolitis. Aortic Atherosclerosis (ICD10-I70.0). Electronically Signed   By: Delanna Ahmadi M.D.   On: 09/26/2021 16:17     ASSESSMENT/PLAN:  This is a very pleasant 56 year old African-American female with a history of non-small cell lung cancer, adenocarcinoma.  She was initially diagnosed in 2009.  She underwent a left trisegmentectomy under the care of Dr. Arlyce Dice.  She had evidence of recurrent disease in recurrence in 2012 and she underwent a course of concurrent chemoradiation.  In early June 2021 the patient was found to have suspicious right lower lobe pulmonary nodules concerning for disease metastasis versus metachronous primary.  She had a PET scan which showed no hypermetabolic activity in either the new right lower lobe pulmonary nodule which is reassuring but low-grade or well differentiated neoplasm cannot be completely excluded.  The patient recently had a restaging CT scan performed.  Dr. Julien Nordmann personally and independently reviewed the scan and discussed the results with the patient today.  The scan showed slight interval increase in the right lower lobe lesion. Dr. Julien Nordmann discussed this remains suspicious. Dr. Julien Nordmann recommends follow up CT scan of the chest in 6 months. If this continues to enlarge, then Dr. Julien Nordmann may consider referral to radiation oncology for SBRT.   I will order her  scan without contrast due to her renal insufficiency.   Dr. Julien Nordmann recommends that the patient continue on observation with a restaging CT scan of the chest in 6 months.   We will see her back for follow-up at that time for evaluation to review her scan results.   The patient was strongly encouraged to quit smoking.  The patient is interested in seeing a specialist for her COPD. She reports worsening breathing since last being seen 1 year ago. I have placed the referral to pulmonary medicine.    I discussed since we only see the patient once every 6-12 months, that we would not be the appropriate office to fill out her daughter's FMLA. This should be directed to her PCP. I would be happy to write her daughter a work excuse note though for her appointment today if needed.   The patient was advised to call immediately if she has any concerning symptoms in the interval. The patient voices understanding of current disease status and treatment options and is in agreement with the current care plan. All questions were answered. The patient knows to call the clinic with any problems, questions or concerns. We can certainly see the patient much sooner if necessary      Orders Placed This Encounter  Procedures   CT Chest Wo Contrast    Standing Status:   Future    Standing Expiration Date:   10/05/2022    Order Specific Question:   Is patient pregnant?    Answer:   No    Order Specific Question:   Preferred imaging location?    Answer:   [facility]   CBC with Differential ([place] Only)    Standing Status:   Future    Standing Expiration Date:   10/06/2022   CMP ([facility] only)    Standing Status:   Future    Standing Expiration Date:   10/06/2022   Ambulatory referral to Pulmonology    Referral Priority:   Routine    Referral Type:   Consultation  Referral Reason:   Specialty Services Required    Requested Specialty:   Pulmonary Disease    Number of Visits  Requested:   [place], PA-C 10/05/21  ADDENDUM: Hematology/Oncology Attending: I had a face-to-face encounter with the patient today.  I reviewed her records, lab, scan and recommended her care plan.  This is a very pleasant 56 years old African-American female with history of recurrent non-small cell lung cancer that was initially diagnosed as a stage Ia (T1b, N0, M0 adenocarcinoma in November 2009 with recurrence in September 2022.  She is status post left upper lobe trisegmentectomy with lymph node dissection followed by course of concurrent chemoradiation with weekly carboplatin and paclitaxel completed in 2002. The patient has been on observation since that time.  She is feeling fine with no concerning complaints except for the baseline shortness of breath and unfortunately she continues to smoke. She had repeat CT scan of the chest performed recently.  I personally and independently reviewed the scan and discussed the result with the patient and her daughter today.  The patient continues to have mild increase and lobulated nodule of the superior segment of the right lower lobe that was not hypermetabolic on the previous imaging studies but still concerning for slowly growing neoplasm. I recommended for the patient to continue on observation and we will repeat CT scan of the chest in 6 months for evaluation of her condition and if it continues to increase in size, will consider repeat bronchoscopy and biopsy of this nodule followed by SBRT. The patient was also strongly advised to quit smoking. She was advised to call immediately if she has any other concerning symptoms in the interval. The total time spent in the appointment was 20 minutes. Disclaimer: This note was dictated with voice recognition software. Similar sounding words can inadvertently be transcribed and may be missed upon review. Eilleen Kempf, MD

## 2021-10-01 ENCOUNTER — Ambulatory Visit: Payer: Medicare Other | Admitting: Physician Assistant

## 2021-10-05 ENCOUNTER — Encounter: Payer: Self-pay | Admitting: Physician Assistant

## 2021-10-05 ENCOUNTER — Other Ambulatory Visit: Payer: Self-pay

## 2021-10-05 ENCOUNTER — Inpatient Hospital Stay (HOSPITAL_BASED_OUTPATIENT_CLINIC_OR_DEPARTMENT_OTHER): Payer: Medicare Other | Admitting: Physician Assistant

## 2021-10-05 VITALS — BP 147/79 | HR 73 | Temp 98.1°F | Resp 18 | Ht 62.0 in | Wt 213.9 lb

## 2021-10-05 DIAGNOSIS — J449 Chronic obstructive pulmonary disease, unspecified: Secondary | ICD-10-CM | POA: Insufficient documentation

## 2021-10-05 DIAGNOSIS — Z85118 Personal history of other malignant neoplasm of bronchus and lung: Secondary | ICD-10-CM | POA: Diagnosis present

## 2021-10-05 DIAGNOSIS — Z08 Encounter for follow-up examination after completed treatment for malignant neoplasm: Secondary | ICD-10-CM | POA: Diagnosis present

## 2021-10-05 DIAGNOSIS — C3492 Malignant neoplasm of unspecified part of left bronchus or lung: Secondary | ICD-10-CM

## 2021-10-05 DIAGNOSIS — R911 Solitary pulmonary nodule: Secondary | ICD-10-CM | POA: Diagnosis not present

## 2021-10-29 ENCOUNTER — Institutional Professional Consult (permissible substitution): Payer: Medicare Other | Admitting: Pulmonary Disease

## 2021-11-15 NOTE — Progress Notes (Unsigned)
Synopsis: Referred in September 2023 for abnormal CT scan of the chest.  She has a history of COPD as well as adenocarcinoma of the left upper lobe.  Initially diagnosed as stage Ia adenocarcinoma in November 2009, had evidence of recurrent disease in September 2012.  Underwent left upper lobe trisegmentectomy with lymph node dissection in 2011, concurrent chemoradiation with carboplatin and paclitaxel in 2012.   Subjective:   PATIENT ID: Theresa [name] GENDER: female DOB: 1965/07/25, MRN: [id number]   HPI  No chief complaint on file.   *** Record review: July 2023 oncology records reviewed: The patient has been followed by Dr. Earlie Server for a number of years for recurrent adenocarcinoma and continues to smoke cigarettes.  She was noted in that visit to have significant phlegm production and "drowning in phlegm".  She was referred to [place] for evaluation and management of her COPD and shortness of breath.  Dr. Julien Nordmann reviewed the CT scan results and recommended a repeat CT in 6 months.  He is considering SBRT if it continues to increase in size.  Past Medical History:  Diagnosis Date   Anxiety    Arthritis    Blindness, legal    glaucoma and retinitis  pigmentosa   Chronic eczema    Chronic headaches 10-17-11   not migraines- just regular   Constipation    Depression    Diabetes mellitus type II    diet    Family history of anesthesia complication    1 son had malignant hyperthermia at 50yrs  tonsils rained   Family history of malignant hyperthermia    1 son 16 years ago   Fibromyalgia    denies, has not been diagnosited   GERD (gastroesophageal reflux disease)    Glaucoma    History of chemotherapy 01/2011 to 03/2011   concurrent w/radiation therapy   Hx of radiation therapy 01-17-11 to 03-18-11   lung   Hyperlipidemia    Hypertension    Lung cancer ([place])    lung ca dx 2010   Malignant hyperthermia    As of 12/30/15, no personal MH history, but reported her son has a history  of MH ~ 2000 (no confirmatory testing done)   Onychomycosis    Retinitis pigmentosa    Shortness of breath    with exertion   Sleep apnea    cpap 5 yrs     Family History  Problem Relation Age of Onset   Brain cancer Maternal Aunt    Depression Mother    Heart disease Mother    Liver cancer Mother    Depression Son    Diabetes Maternal Grandmother    Stroke Maternal Grandmother    Depression Maternal Grandmother    Depression Daughter    Malignant hyperthermia Son 16       tonsils drained   Colon cancer Neg Hx    Rectal cancer Neg Hx    Stomach cancer Neg Hx      Social History   Socioeconomic History   Marital status: Married    Spouse name: Not on file   Number of children: 3   Years of education: Not on file   Highest education level: Not on file  Occupational History   Not on file  Tobacco Use   Smoking status: Some Days    Packs/day: 0.18    Years: 36.00    Total pack years: 6.48    Types: Cigarettes    Start date: 03/14/1978   Smokeless tobacco:  Never   Tobacco comments:    starting chantix, hx 1 1/2 PPD(has decrease to 2 cigs per day)  Vaping Use   Vaping Use: Never used  Substance and Sexual Activity   Alcohol use: No    Alcohol/week: 0.0 standard drinks of alcohol   Drug use: No   Sexual activity: Yes    Comment: 11/25/13 - smoking 1/2 of 1.  Other Topics Concern   Not on file  Social History Narrative   Not on file   Social Determinants of Health   Financial Resource Strain: Not on file  Food Insecurity: Not on file  Transportation Needs: Not on file  Physical Activity: Not on file  Stress: Not on file  Social Connections: Not on file  Intimate Partner Violence: Not on file     Allergies  Allergen Reactions   Penicillins Hives and Rash    07-05-18 Patient relates pcn allergy was many years ago.  Relates had amoxicillin in last few years without problem.  Med Augmentin prescribed 06/2014 and patient seen 10 days later no mention of allergy   Has patient had a PCN reaction causing immediate rash, facial/tongue/throat swelling, SOB or lightheadedness with hypotension:   # # YES # #   Has patient had a PCN reaction causing severe rash involving mucus membranes or skin necrosis:  # # NO # #  Has patient had a PCN reaction that required hospit   Ibuprofen    Codeine Nausea And Vomiting   Morphine And Related Nausea Only   Tomato Hives and Itching     Outpatient Medications Prior to Visit  Medication Sig Dispense Refill   albuterol (PROAIR HFA) 108 (90 Base) MCG/ACT inhaler Inhale 2 puffs into the lungs every 4 (four) hours as needed for wheezing or shortness of breath. 8 g 3   albuterol (PROVENTIL) (2.5 MG/3ML) 0.083% nebulizer solution Take 6 mLs (5 mg total) by nebulization every 6 (six) hours as needed for wheezing or shortness of breath. 150 mL 1   amLODipine (NORVASC) 10 MG tablet TAKE 1 TABLET (10 MG TOTAL) BY MOUTH DAILY. MAKE APPT FOR FUTURE REFILLS. 90 tablet 0   aspirin EC 81 MG tablet Take 1 tablet (81 mg total) by mouth daily. 30 tablet 11   atorvastatin (LIPITOR) 80 MG tablet TAKE 1 TABLET BY MOUTH EVERY DAY 90 tablet 3   baclofen (LIORESAL) 10 MG tablet Oral for 90 Days     carvedilol (COREG) 25 MG tablet TAKE 1 TABLET (25 MG TOTAL) BY MOUTH TWICE A DAY WITH MEALS 180 tablet 1   clobetasol cream (TEMOVATE) 5.85 % Apply 1 application topically 2 (two) times daily. 60 g 1   doxycycline (VIBRA-TABS) 100 MG tablet Take 1 tablet (100 mg total) by mouth 2 (two) times daily. 10 tablet 0   DULoxetine (CYMBALTA) 60 MG capsule NEEDS APPT FOR FUTURE REFILLS. TAKE 1 CAPSULE BY MOUTH EVERY DAY 90 capsule 2   fluticasone (FLONASE) 50 MCG/ACT nasal spray SPRAY 2 SPRAYS INTO EACH NOSTRIL EVERY DAY 48 mL 2   gabapentin (NEURONTIN) 300 MG capsule TAKE 1 CAPSULE BY MOUTH THREE TIMES A DAY 270 capsule 1   insulin glargine (LANTUS) 100 UNIT/ML Solostar Pen Inject 20 Units into the skin every morning. 3 mL 11   Insulin Pen Needle (PEN  NEEDLES) 32G X 4 MM MISC 1 Stick by Does not apply route daily. 100 each 3   linaclotide (LINZESS) 290 MCG CAPS capsule TAKE 1 CAPSULE (290 MCG TOTAL) BY  MOUTH DAILY. 90 capsule 3   metFORMIN (GLUCOPHAGE) 1000 MG tablet TAKE 1 TABLET (1,000 MG TOTAL) BY MOUTH TWICE A DAY WITH FOOD 180 tablet 0   metFORMIN (GLUCOPHAGE) 500 MG tablet Take 2 tablets (1,000 mg total) by mouth 2 (two) times daily with a meal. 120 tablet 6   montelukast (SINGULAIR) 10 MG tablet Take 1 tablet (10 mg total) by mouth at bedtime. 90 tablet 3   omeprazole (PRILOSEC) 20 MG capsule TAKE 1 CAPSULE BY MOUTH EVERY DAY 15 capsule 0   oxymetazoline (AFRIN NASAL SPRAY) 0.05 % nasal spray Place 1 spray into both nostrils 2 (two) times daily. 30 mL 0   Semaglutide,0.25 or 0.5MG /DOS, (OZEMPIC, 0.25 OR 0.5 MG/DOSE,) 2 MG/1.5ML SOPN Inject 0.5 mg into the skin once a week. 3 mL 3   Semaglutide,0.25 or 0.5MG /DOS, (OZEMPIC, 0.25 OR 0.5 MG/DOSE,) 2 MG/1.5ML SOPN Inject 0.5 mg into the skin once a week. 3 mL 3   timolol (TIMOPTIC) 0.5 % ophthalmic solution Place 1 drop into both eyes 2 (two) times daily.     Travoprost, BAK Free, (TRAVATAN) 0.004 % SOLN ophthalmic solution Place 1 drop into both eyes at bedtime.     umeclidinium-vilanterol (ANORO ELLIPTA) 62.5-25 MCG/INH AEPB Inhale 1 puff into the lungs daily. 1 each 0   varenicline (CHANTIX CONTINUING MONTH PAK) 1 MG tablet Take by mouth.     No facility-administered medications prior to visit.    ROS    Objective:  Physical Exam   There were no vitals filed for this visit.  ***  CBC    Component Value Date/Time   WBC 10.7 (H) 09/24/2021 1044   WBC 7.0 01/18/2017 1002   RBC 5.51 (H) 09/24/2021 1044   HGB 16.3 (H) 09/24/2021 1044   HGB 15.9 01/29/2020 1436   HGB 17.0 (H) 02/06/2015 0939   HCT 48.4 (H) 09/24/2021 1044   HCT 48.0 (H) 01/29/2020 1436   HCT 51.8 (H) 02/06/2015 0939   PLT 233 09/24/2021 1044   PLT 229 01/29/2020 1436   MCV 87.8 09/24/2021 1044   MCV 91  01/29/2020 1436   MCV 90.3 02/06/2015 0939   MCH 29.6 09/24/2021 1044   MCHC 33.7 09/24/2021 1044   RDW 13.8 09/24/2021 1044   RDW 13.3 01/29/2020 1436   RDW 13.9 02/06/2015 0939   LYMPHSABS 1.8 09/24/2021 1044   LYMPHSABS 1.8 01/29/2020 1436   LYMPHSABS 1.8 02/06/2015 0939   MONOABS 0.8 09/24/2021 1044   MONOABS 0.8 02/06/2015 0939   EOSABS 0.2 09/24/2021 1044   EOSABS 0.2 01/29/2020 1436   BASOSABS 0.0 09/24/2021 1044   BASOSABS 0.0 01/29/2020 1436   BASOSABS 0.1 02/06/2015 0939     Chest imaging: July 2023 CT scan of the chest images independently reviewed showing partial collapse of the left upper lobe with calcification, right lower lobe lobulated nodule has increased in size to 1.5 x 1.1 cm, previously 1.4 x 0.9 cm.,  Background of fine centrilobular nodularity noted particular in the right upper lobe.  PFT:  Labs:  Path:  Echo:  Heart Catheterization:       Assessment & Plan:   No diagnosis found.  Discussion: ***  Immunizations: Immunization History  Administered Date(s) Administered   H1N1 02/20/2008   Influenza,inj,Quad PF,6+ Mos 02/13/2017   PFIZER(Purple Top)SARS-COV-2 Vaccination 11/07/2019, 01/29/2020   Pneumococcal Polysaccharide-23 02/13/2017   Td 04/14/2002   Tdap 12/01/2016     Current Outpatient Medications:    albuterol (PROAIR HFA) 108 (90 Base) MCG/ACT inhaler,  Inhale 2 puffs into the lungs every 4 (four) hours as needed for wheezing or shortness of breath., Disp: 8 g, Rfl: 3   albuterol (PROVENTIL) (2.5 MG/3ML) 0.083% nebulizer solution, Take 6 mLs (5 mg total) by nebulization every 6 (six) hours as needed for wheezing or shortness of breath., Disp: 150 mL, Rfl: 1   amLODipine (NORVASC) 10 MG tablet, TAKE 1 TABLET (10 MG TOTAL) BY MOUTH DAILY. MAKE APPT FOR FUTURE REFILLS., Disp: 90 tablet, Rfl: 0   aspirin EC 81 MG tablet, Take 1 tablet (81 mg total) by mouth daily., Disp: 30 tablet, Rfl: 11   atorvastatin (LIPITOR) 80 MG tablet,  TAKE 1 TABLET BY MOUTH EVERY DAY, Disp: 90 tablet, Rfl: 3   baclofen (LIORESAL) 10 MG tablet, Oral for 90 Days, Disp: , Rfl:    carvedilol (COREG) 25 MG tablet, TAKE 1 TABLET (25 MG TOTAL) BY MOUTH TWICE A DAY WITH MEALS, Disp: 180 tablet, Rfl: 1   clobetasol cream (TEMOVATE) 8.29 %, Apply 1 application topically 2 (two) times daily., Disp: 60 g, Rfl: 1   doxycycline (VIBRA-TABS) 100 MG tablet, Take 1 tablet (100 mg total) by mouth 2 (two) times daily., Disp: 10 tablet, Rfl: 0   DULoxetine (CYMBALTA) 60 MG capsule, NEEDS APPT FOR FUTURE REFILLS. TAKE 1 CAPSULE BY MOUTH EVERY DAY, Disp: 90 capsule, Rfl: 2   fluticasone (FLONASE) 50 MCG/ACT nasal spray, SPRAY 2 SPRAYS INTO EACH NOSTRIL EVERY DAY, Disp: 48 mL, Rfl: 2   gabapentin (NEURONTIN) 300 MG capsule, TAKE 1 CAPSULE BY MOUTH THREE TIMES A DAY, Disp: 270 capsule, Rfl: 1   insulin glargine (LANTUS) 100 UNIT/ML Solostar Pen, Inject 20 Units into the skin every morning., Disp: 3 mL, Rfl: 11   Insulin Pen Needle (PEN NEEDLES) 32G X 4 MM MISC, 1 Stick by Does not apply route daily., Disp: 100 each, Rfl: 3   linaclotide (LINZESS) 290 MCG CAPS capsule, TAKE 1 CAPSULE (290 MCG TOTAL) BY MOUTH DAILY., Disp: 90 capsule, Rfl: 3   metFORMIN (GLUCOPHAGE) 1000 MG tablet, TAKE 1 TABLET (1,000 MG TOTAL) BY MOUTH TWICE A DAY WITH FOOD, Disp: 180 tablet, Rfl: 0   metFORMIN (GLUCOPHAGE) 500 MG tablet, Take 2 tablets (1,000 mg total) by mouth 2 (two) times daily with a meal., Disp: 120 tablet, Rfl: 6   montelukast (SINGULAIR) 10 MG tablet, Take 1 tablet (10 mg total) by mouth at bedtime., Disp: 90 tablet, Rfl: 3   omeprazole (PRILOSEC) 20 MG capsule, TAKE 1 CAPSULE BY MOUTH EVERY DAY, Disp: 15 capsule, Rfl: 0   oxymetazoline (AFRIN NASAL SPRAY) 0.05 % nasal spray, Place 1 spray into both nostrils 2 (two) times daily., Disp: 30 mL, Rfl: 0   Semaglutide,0.25 or 0.5MG /DOS, (OZEMPIC, 0.25 OR 0.5 MG/DOSE,) 2 MG/1.5ML SOPN, Inject 0.5 mg into the skin once a week., Disp: 3  mL, Rfl: 3   Semaglutide,0.25 or 0.5MG /DOS, (OZEMPIC, 0.25 OR 0.5 MG/DOSE,) 2 MG/1.5ML SOPN, Inject 0.5 mg into the skin once a week., Disp: 3 mL, Rfl: 3   timolol (TIMOPTIC) 0.5 % ophthalmic solution, Place 1 drop into both eyes 2 (two) times daily., Disp: , Rfl:    Travoprost, BAK Free, (TRAVATAN) 0.004 % SOLN ophthalmic solution, Place 1 drop into both eyes at bedtime., Disp: , Rfl:    umeclidinium-vilanterol (ANORO ELLIPTA) 62.5-25 MCG/INH AEPB, Inhale 1 puff into the lungs daily., Disp: 1 each, Rfl: 0   varenicline (CHANTIX CONTINUING MONTH PAK) 1 MG tablet, Take by mouth., Disp: , Rfl:

## 2021-11-17 ENCOUNTER — Ambulatory Visit: Payer: Medicare Other | Admitting: Pulmonary Disease

## 2021-11-17 ENCOUNTER — Encounter: Payer: Self-pay | Admitting: Pulmonary Disease

## 2021-11-17 VITALS — BP 134/80 | HR 73 | Temp 98.9°F | Ht 62.0 in | Wt 213.2 lb

## 2021-11-17 DIAGNOSIS — J42 Unspecified chronic bronchitis: Secondary | ICD-10-CM | POA: Diagnosis not present

## 2021-11-17 DIAGNOSIS — G4733 Obstructive sleep apnea (adult) (pediatric): Secondary | ICD-10-CM

## 2021-11-17 DIAGNOSIS — C3492 Malignant neoplasm of unspecified part of left bronchus or lung: Secondary | ICD-10-CM | POA: Diagnosis not present

## 2021-11-17 DIAGNOSIS — J984 Other disorders of lung: Secondary | ICD-10-CM

## 2021-11-17 DIAGNOSIS — J320 Chronic maxillary sinusitis: Secondary | ICD-10-CM

## 2021-11-17 MED ORDER — BUDESONIDE-FORMOTEROL FUMARATE 160-4.5 MCG/ACT IN AERO: 2.0000 | INHALATION_SPRAY | Freq: Two times a day (BID) | RESPIRATORY_TRACT | 6 refills | Status: DC

## 2021-11-17 MED ORDER — AEROCHAMBER MV MISC
0 refills | Status: AC
Start: 2021-11-17 — End: ?

## 2021-11-17 NOTE — Patient Instructions (Signed)
Small airways obstruction from asthma versus cigarette smoking: Symbicort 2 puffs twice a day no matter how you feel with a spacer Use albuterol as needed for chest tightness wheezing or shortness of breath Practice good hand hygiene Stay active Keep immunizations up to date   Cigarette smoking: You really need to quit smoking Use Chantix as prescribed by Dr. 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 Julien Nordmann and his plan at this point is to repeat a CT in 6 months Keep follow-up with oncology  Allergic rhinitis with cough: Take Zyrtec daily Slowly wean yourself off of Afrin Continue taking Flonase 2 sprays each nostril daily Use Neil Med rinses with distilled water at least twice per day using the instructions on the package. 1/2 hour after using the [facility] Med rinse, use flonase two puffs in each nostril once per day.  Remember that the Flonase can take 1-2 weeks to work after regular use. Use generic zyrtec (cetirizine) every day.  If this doesn't help, then stop taking it and use chlorpheniramine-phenylephrine combination tablets.  Chronic cough: You need to try to suppress your cough to allow your larynx (voice box) to heal.  For three days don't talk, laugh, sing, or clear your throat. Do everything you can to suppress the cough during this time. Use hard candies (sugarless Jolly Ranchers) or non-mint or non-menthol containing cough drops during this time to soothe your throat.  Use a cough suppressant (Delsym or what I have prescribed you) around the clock during this time.  After three days, gradually increase the use of your voice and back off on the cough suppressants.  We will see you back in 6-8 weeks or sooner if needed

## 2022-01-14 ENCOUNTER — Other Ambulatory Visit: Payer: Self-pay | Admitting: Family Medicine

## 2022-01-14 DIAGNOSIS — K581 Irritable bowel syndrome with constipation: Secondary | ICD-10-CM

## 2022-01-17 ENCOUNTER — Encounter: Payer: Self-pay | Admitting: Pulmonary Disease

## 2022-01-17 ENCOUNTER — Ambulatory Visit (INDEPENDENT_AMBULATORY_CARE_PROVIDER_SITE_OTHER): Payer: Medicare Other | Admitting: Pulmonary Disease

## 2022-01-17 VITALS — BP 110/80 | HR 60 | Ht 62.0 in | Wt 213.0 lb

## 2022-01-17 DIAGNOSIS — G9001 Carotid sinus syncope: Secondary | ICD-10-CM

## 2022-01-17 DIAGNOSIS — C3492 Malignant neoplasm of unspecified part of left bronchus or lung: Secondary | ICD-10-CM | POA: Diagnosis not present

## 2022-01-17 DIAGNOSIS — J3089 Other allergic rhinitis: Secondary | ICD-10-CM

## 2022-01-17 DIAGNOSIS — R519 Headache, unspecified: Secondary | ICD-10-CM

## 2022-01-17 MED ORDER — AZELASTINE HCL 0.1 % NA SOLN: 1.0000 | Freq: Every day | NASAL | Status: DC

## 2022-01-17 MED ORDER — AZELASTINE HCL 0.1 % NA SOLN: 2.0000 | Freq: Two times a day (BID) | NASAL | Status: AC

## 2022-01-17 NOTE — Patient Instructions (Signed)
Pulmonary nodules, increasing in size with known history of non-small cell lung cancer: Repeat CT chest now May need to consider biopsy  Cigarette smoking: I strongly recommend that she quit smoking right away Call 1-800-quit-NOW to get free nicotine replacement from the state of [place] airways disease with cough due to cigarette smoking: Quit smoking Continue Symbicort 2 puffs twice a day no matter how you feel  Left-sided headache with carotidynia: Serum ESR Serum ANCA I will call you with these results and may call in prednisone ENT referral  Chronic sinusitis with significant sinus pain: Continue Flonase Continue Zyrtec Continue saline rinses Start Astelin CT sinuses ENT referral  Follow-up with me in 4 weeks or sooner if needed

## 2022-01-17 NOTE — Progress Notes (Unsigned)
Synopsis: Referred in September 2023 for abnormal CT scan of the chest.  She has a history of COPD as well as adenocarcinoma of the left upper lobe.  Initially diagnosed as stage Ia adenocarcinoma in November 2009, had evidence of recurrent disease in September 2012.  Underwent left upper lobe trisegmentectomy with lymph node dissection in 2011, concurrent chemoradiation with carboplatin and paclitaxel in 2012.   Subjective:   PATIENT ID: Theresa [name] GENDER: female DOB: 25-Jul-1965, MRN: [id number]   HPI  No chief complaint on file.   Theresa [name] says that her dyspnea and cough have improved somewhat since the last visit.  She says that her respiratory symptoms have significantly proved since taking Symbicort.  However, she continues to have severe headache, and sinus congestion.  She says that she feels that her sinuses are stopped up quite a bit.  However, she says that the headache is throbbing, predominantly located to the left side of her face, hurts with chewing, and she feels like "a blood vessel is going to explode" on the left side of her face.  She describes scalp tenderness on the left side as well.  No fevers or chills.  She continues to smoke cigarettes, 1/2 pack/day.  She is taking saline rinses, Flonase, Zyrtec.  She says that the weight of knowing that she has pulmonary nodules which are increasing in size has been weighing on her quite a bit and she is very worried.  She is requesting that we evaluate this further with a CT scan.  Past Medical History:  Diagnosis Date   Anxiety    Arthritis    Blindness, legal    glaucoma and retinitis  pigmentosa   Chronic eczema    Chronic headaches 10-17-11   not migraines- just regular   Constipation    Depression    Diabetes mellitus type II    diet    Family history of anesthesia complication    1 son had malignant hyperthermia at 50yr  tonsils rained   Family history of malignant hyperthermia    1 son 16 years ago    Fibromyalgia    denies, has not been diagnosited   GERD (gastroesophageal reflux disease)    Glaucoma    History of chemotherapy 01/2011 to 03/2011   concurrent w/radiation therapy   Hx of radiation therapy 01-17-11 to 03-18-11   lung   Hyperlipidemia    Hypertension    Lung cancer ([facility]    lung ca dx 2010   Malignant hyperthermia    As of 12/30/15, no personal MH history, but reported her son has a history of MH ~ 2000 (no confirmatory testing done)   Onychomycosis    Retinitis pigmentosa    Shortness of breath    with exertion   Sleep apnea    cpap 5 yrs      Review of Systems  Constitutional:  Positive for malaise/fatigue. Negative for fever and weight loss.  HENT:  Positive for congestion, sinus pain and sore throat. Negative for nosebleeds.   Respiratory:  Positive for cough. Negative for sputum production and shortness of breath.   Cardiovascular:  Negative for orthopnea, claudication and leg swelling.       Objective:  Physical Exam   Vitals:   01/17/22 1630  BP: 110/80  Pulse: 60  SpO2: 96%  Weight: 213 lb (96.6 kg)  Height: _0  (1.575 m)    RA  Gen: well appearing HENT: OP clear,, neck supple, carotidynia on left, not  right, similar tenderness over temporal artery on left PULM: CTA B, normal percussion CV: RRR, no mgr, trace edema GI: BS+, soft, nontender Derm: no cyanosis or rash Psyche: normal mood and affect    CBC    Component Value Date/Time   WBC 10.7 (H) 09/24/2021 1044   WBC 7.0 01/18/2017 1002   RBC 5.51 (H) 09/24/2021 1044   HGB 16.3 (H) 09/24/2021 1044   HGB 15.9 01/29/2020 1436   HGB 17.0 (H) 02/06/2015 0939   HCT 48.4 (H) 09/24/2021 1044   HCT 48.0 (H) 01/29/2020 1436   HCT 51.8 (H) 02/06/2015 0939   PLT 233 09/24/2021 1044   PLT 229 01/29/2020 1436   MCV 87.8 09/24/2021 1044   MCV 91 01/29/2020 1436   MCV 90.3 02/06/2015 0939   MCH 29.6 09/24/2021 1044   MCHC 33.7 09/24/2021 1044   RDW 13.8 09/24/2021 1044   RDW 13.3  01/29/2020 1436   RDW 13.9 02/06/2015 0939   LYMPHSABS 1.8 09/24/2021 1044   LYMPHSABS 1.8 01/29/2020 1436   LYMPHSABS 1.8 02/06/2015 0939   MONOABS 0.8 09/24/2021 1044   MONOABS 0.8 02/06/2015 0939   EOSABS 0.2 09/24/2021 1044   EOSABS 0.2 01/29/2020 1436   BASOSABS 0.0 09/24/2021 1044   BASOSABS 0.0 01/29/2020 1436   BASOSABS 0.1 02/06/2015 0939     Chest imaging: July 2023 CT scan of the chest images independently reviewed showing partial collapse of the left upper lobe with calcification, right lower lobe lobulated nodule has increased in size to 1.5 x 1.1 cm, previously 1.4 x 0.9 cm.,  Background of fine centrilobular nodularity noted particular in the right upper lobe.  PFT: September 2023 spirometry ratio 71%, FEV1 1.19 L 61% predicted, flow volume loop consistent with small airways obstruction  Labs:  Path:  Echo:  Heart Catheterization:       Assessment & Plan:   Headache around the eyes - Plan: Sed Rate (ESR), CT Maxillofacial LTD WO CM, ANCA Screen Reflex Titer, CT Chest Wo Contrast, azelastine (ASTELIN) 0.1 % nasal spray 2 spray, Ambulatory referral to ENT  Non-small cell carcinoma of left lung (HCC) - Plan: Sed Rate (ESR), CT Maxillofacial LTD WO CM, ANCA Screen Reflex Titer, CT Chest Wo Contrast, azelastine (ASTELIN) 0.1 % nasal spray 2 spray, Ambulatory referral to ENT  Non-seasonal allergic rhinitis, unspecified trigger - Plan: Sed Rate (ESR), CT Maxillofacial LTD WO CM, ANCA Screen Reflex Titer, CT Chest Wo Contrast, azelastine (ASTELIN) 0.1 % nasal spray 2 spray, Ambulatory referral to ENT  Carotidynia  Discussion: Her smoking related cough in the setting of small airways disease from cigarette smoking has improved somewhat but it still a problem because she still smokes.  With more concerning today is the fact that her physical exam and symptoms are worrisome for temporal arteritis.  Fortunately, she has no evidence of significant visual change on the  left side.  She is very worried about the increasing size of her pulmonary nodules, she is requesting that we get a CT scan sooner rather than later.  I think this is reasonable given her known lung cancer history and ongoing tobacco use.  Plan: Pulmonary nodules, increasing in size with known history of non-small cell lung cancer: Repeat CT chest now May need to consider biopsy  Cigarette smoking: I strongly recommend that she quit smoking right away Call 1-800-quit-NOW to get free nicotine replacement from the state of [place] airways disease with cough due to cigarette smoking: Quit smoking Continue Symbicort  2 puffs twice a day no matter how you feel  Left-sided headache with carotidynia: Serum ESR Serum ANCA I will call you with these results and may call in prednisone ENT referral  Chronic sinusitis with significant sinus pain: Continue Flonase Continue Zyrtec Continue saline rinses Start Astelin CT sinuses ENT referral  Follow-up with me in 4 weeks or sooner if needed   Immunizations: Immunization History  Administered Date(s) Administered   H1N1 02/20/2008   Influenza,inj,Quad PF,6+ Mos 02/13/2017   PFIZER(Purple Top)SARS-COV-2 Vaccination 11/07/2019, 01/29/2020   Pneumococcal Polysaccharide-23 02/13/2017   Td 04/14/2002   Tdap 12/01/2016     Current Outpatient Medications:    albuterol (PROVENTIL) (2.5 MG/3ML) 0.083% nebulizer solution, Take 6 mLs (5 mg total) by nebulization every 6 (six) hours as needed for wheezing or shortness of breath., Disp: 150 mL, Rfl: 1   budesonide-formoterol (SYMBICORT) 160-4.5 MCG/ACT inhaler, Inhale 2 puffs into the lungs in the morning and at bedtime., Disp: 10.2 g, Rfl: 6   fluticasone (FLONASE) 50 MCG/ACT nasal spray, SPRAY 2 SPRAYS INTO EACH NOSTRIL EVERY DAY, Disp: 48 mL, Rfl: 2   montelukast (SINGULAIR) 10 MG tablet, Take 1 tablet (10 mg total) by mouth at bedtime., Disp: 90 tablet, Rfl: 3   oxymetazoline  (AFRIN NASAL SPRAY) 0.05 % nasal spray, Place 1 spray into both nostrils 2 (two) times daily., Disp: 30 mL, Rfl: 0   Spacer/Aero-Holding Chambers (AEROCHAMBER MV) inhaler, Use as instructed, Disp: 1 each, Rfl: 0   varenicline (CHANTIX CONTINUING MONTH PAK) 1 MG tablet, Take by mouth., Disp: , Rfl:    albuterol (PROAIR HFA) 108 (90 Base) MCG/ACT inhaler, Inhale 2 puffs into the lungs every 4 (four) hours as needed for wheezing or shortness of breath. (Patient not taking: Reported on 01/17/2022), Disp: 8 g, Rfl: 3   amLODipine (NORVASC) 10 MG tablet, TAKE 1 TABLET (10 MG TOTAL) BY MOUTH DAILY. MAKE APPT FOR FUTURE REFILLS., Disp: 90 tablet, Rfl: 0   aspirin EC 81 MG tablet, Take 1 tablet (81 mg total) by mouth daily., Disp: 30 tablet, Rfl: 11   atorvastatin (LIPITOR) 80 MG tablet, TAKE 1 TABLET BY MOUTH EVERY DAY, Disp: 90 tablet, Rfl: 3   baclofen (LIORESAL) 10 MG tablet, Oral for 90 Days, Disp: , Rfl:    carvedilol (COREG) 25 MG tablet, TAKE 1 TABLET (25 MG TOTAL) BY MOUTH TWICE A DAY WITH MEALS, Disp: 180 tablet, Rfl: 1   clobetasol cream (TEMOVATE) 1.54 %, Apply 1 application topically 2 (two) times daily., Disp: 60 g, Rfl: 1   DULoxetine (CYMBALTA) 60 MG capsule, NEEDS APPT FOR FUTURE REFILLS. TAKE 1 CAPSULE BY MOUTH EVERY DAY, Disp: 90 capsule, Rfl: 2   gabapentin (NEURONTIN) 300 MG capsule, TAKE 1 CAPSULE BY MOUTH THREE TIMES A DAY, Disp: 270 capsule, Rfl: 1   insulin glargine (LANTUS SOLOSTAR) 100 UNIT/ML Solostar Pen, INJECT 20 UNITS INTO THE SKIN EVERY MORNING., Disp: 15 mL, Rfl: 3   Insulin Pen Needle (PEN NEEDLES) 32G X 4 MM MISC, 1 Stick by Does not apply route daily., Disp: 100 each, Rfl: 3   linaclotide (LINZESS) 290 MCG CAPS capsule, Take 1 capsule (290 mcg total) by mouth daily. REQUIRES VISIT BEFORE NEXT REFILL, Disp: 45 capsule, Rfl: 0   metFORMIN (GLUCOPHAGE) 1000 MG tablet, TAKE 1 TABLET (1,000 MG TOTAL) BY MOUTH TWICE A DAY WITH FOOD, Disp: 180 tablet, Rfl: 0   metFORMIN  (GLUCOPHAGE) 500 MG tablet, Take 2 tablets (1,000 mg total) by mouth 2 (two) times daily  with a meal., Disp: 120 tablet, Rfl: 6   omeprazole (PRILOSEC) 20 MG capsule, TAKE 1 CAPSULE BY MOUTH EVERY DAY, Disp: 15 capsule, Rfl: 0  Current Facility-Administered Medications:    azelastine (ASTELIN) 0.1 % nasal spray 2 spray, 2 spray, Each Nare, BID, Juanito Doom, MD

## 2022-01-21 ENCOUNTER — Other Ambulatory Visit (INDEPENDENT_AMBULATORY_CARE_PROVIDER_SITE_OTHER): Payer: Medicare Other

## 2022-01-21 DIAGNOSIS — R519 Headache, unspecified: Secondary | ICD-10-CM

## 2022-01-21 DIAGNOSIS — J3089 Other allergic rhinitis: Secondary | ICD-10-CM | POA: Diagnosis not present

## 2022-01-21 DIAGNOSIS — C3492 Malignant neoplasm of unspecified part of left bronchus or lung: Secondary | ICD-10-CM | POA: Diagnosis not present

## 2022-01-21 LAB — SEDIMENTATION RATE: Sed Rate: 17 mm/hr (ref 0–30)

## 2022-01-24 LAB — ANCA SCREEN W REFLEX TITER: ANCA SCREEN: NEGATIVE

## 2022-01-25 ENCOUNTER — Ambulatory Visit (HOSPITAL_COMMUNITY): Admission: RE | Admit: 2022-01-25 | Payer: Medicare Other | Source: Ambulatory Visit

## 2022-01-25 ENCOUNTER — Ambulatory Visit (HOSPITAL_COMMUNITY): Payer: Medicare Other

## 2022-02-14 ENCOUNTER — Ambulatory Visit (HOSPITAL_COMMUNITY)
Admission: RE | Admit: 2022-02-14 | Discharge: 2022-02-14 | Disposition: A | Discharge status: Home / Self Care | Payer: Medicare Other | Source: Ambulatory Visit | Attending: Pulmonary Disease | Admitting: Pulmonary Disease

## 2022-02-14 DIAGNOSIS — C3492 Malignant neoplasm of unspecified part of left bronchus or lung: Secondary | ICD-10-CM | POA: Insufficient documentation

## 2022-02-14 DIAGNOSIS — J3089 Other allergic rhinitis: Secondary | ICD-10-CM | POA: Insufficient documentation

## 2022-02-14 DIAGNOSIS — R519 Headache, unspecified: Secondary | ICD-10-CM | POA: Diagnosis not present

## 2022-02-17 ENCOUNTER — Telehealth: Payer: Self-pay | Admitting: Physician Assistant

## 2022-02-17 ENCOUNTER — Telehealth: Payer: Self-pay | Admitting: Pulmonary Disease

## 2022-02-17 NOTE — Telephone Encounter (Signed)
Per 12/5 IB, called and daughter confirmed

## 2022-02-18 NOTE — Telephone Encounter (Signed)
Called patient and went over her CT scan results with her. She is asking how many nodules does she have in her lung. And she was also asking about her Sinus CT scan.  Please advise sir

## 2022-02-22 NOTE — Telephone Encounter (Signed)
Called and spoke to daughter and patient and went over information. Nothing further needed

## 2022-02-23 NOTE — Progress Notes (Deleted)
[facility] OFFICE PROGRESS NOTE  Theresa Drafts, MD [address] [place] [address]  DIAGNOSIS: ***  PRIOR THERAPY:  CURRENT THERAPY:  INTERVAL HISTORY: Theresa [name] 56 y.o. female returns for *** regular *** visit for followup of ***   MEDICAL HISTORY: Past Medical History:  Diagnosis Date   Anxiety    Arthritis    Blindness, legal    glaucoma and retinitis  pigmentosa   Chronic eczema    Chronic headaches 10-17-11   not migraines- just regular   Constipation    Depression    Diabetes mellitus type II    diet    Family history of anesthesia complication    1 son had malignant hyperthermia at 48yrs  tonsils rained   Family history of malignant hyperthermia    1 son 16 years ago   Fibromyalgia    denies, has not been diagnosited   GERD (gastroesophageal reflux disease)    Glaucoma    History of chemotherapy 01/2011 to 03/2011   concurrent w/radiation therapy   Hx of radiation therapy 01-17-11 to 03-18-11   lung   Hyperlipidemia    Hypertension    Lung cancer (HCC)    lung ca dx 2010   Malignant hyperthermia    As of 12/30/15, no personal MH history, but reported her son has a history of MH ~ 2000 (no confirmatory testing done)   Onychomycosis    Retinitis pigmentosa    Shortness of breath    with exertion   Sleep apnea    cpap 5 yrs    ALLERGIES:  is allergic to penicillins, ibuprofen, codeine, morphine and related, and tomato.  MEDICATIONS:  Current Outpatient Medications  Medication Sig Dispense Refill   albuterol (PROAIR HFA) 108 (90 Base) MCG/ACT inhaler Inhale 2 puffs into the lungs every 4 (four) hours as needed for wheezing or shortness of breath. (Patient not taking: Reported on 01/17/2022) 8 g 3   albuterol (PROVENTIL) (2.5 MG/3ML) 0.083% nebulizer solution Take 6 mLs (5 mg total) by nebulization every 6 (six) hours as needed for wheezing or shortness of breath. 150 mL 1   amLODipine (NORVASC) 10 MG tablet TAKE 1 TABLET (10 MG  TOTAL) BY MOUTH DAILY. MAKE APPT FOR FUTURE REFILLS. 90 tablet 0   aspirin EC 81 MG tablet Take 1 tablet (81 mg total) by mouth daily. 30 tablet 11   atorvastatin (LIPITOR) 80 MG tablet TAKE 1 TABLET BY MOUTH EVERY DAY 90 tablet 3   baclofen (LIORESAL) 10 MG tablet Oral for 90 Days     budesonide-formoterol (SYMBICORT) 160-4.5 MCG/ACT inhaler Inhale 2 puffs into the lungs in the morning and at bedtime. 10.2 g 6   carvedilol (COREG) 25 MG tablet TAKE 1 TABLET (25 MG TOTAL) BY MOUTH TWICE A DAY WITH MEALS 180 tablet 1   clobetasol cream (TEMOVATE) 0.05 % Apply 1 application topically 2 (two) times daily. 60 g 1   DULoxetine (CYMBALTA) 60 MG capsule NEEDS APPT FOR FUTURE REFILLS. TAKE 1 CAPSULE BY MOUTH EVERY DAY 90 capsule 2   fluticasone (FLONASE) 50 MCG/ACT nasal spray SPRAY 2 SPRAYS INTO EACH NOSTRIL EVERY DAY 48 mL 2   gabapentin (NEURONTIN) 300 MG capsule TAKE 1 CAPSULE BY MOUTH THREE TIMES A DAY 270 capsule 1   insulin glargine (LANTUS SOLOSTAR) 100 UNIT/ML Solostar Pen INJECT 20 UNITS INTO THE SKIN EVERY MORNING. 15 mL 3   Insulin Pen Needle (PEN NEEDLES) 32G X 4 MM MISC 1 Stick by  Does not apply route daily. 100 each 3   linaclotide (LINZESS) 290 MCG CAPS capsule Take 1 capsule (290 mcg total) by mouth daily. REQUIRES VISIT BEFORE NEXT REFILL 45 capsule 0   metFORMIN (GLUCOPHAGE) 1000 MG tablet TAKE 1 TABLET (1,000 MG TOTAL) BY MOUTH TWICE A DAY WITH FOOD 180 tablet 0   metFORMIN (GLUCOPHAGE) 500 MG tablet Take 2 tablets (1,000 mg total) by mouth 2 (two) times daily with a meal. 120 tablet 6   montelukast (SINGULAIR) 10 MG tablet Take 1 tablet (10 mg total) by mouth at bedtime. 90 tablet 3   omeprazole (PRILOSEC) 20 MG capsule TAKE 1 CAPSULE BY MOUTH EVERY DAY 15 capsule 0   oxymetazoline (AFRIN NASAL SPRAY) 0.05 % nasal spray Place 1 spray into both nostrils 2 (two) times daily. 30 mL 0   Spacer/Aero-Holding Chambers (AEROCHAMBER MV) inhaler Use as instructed 1 each 0   varenicline (CHANTIX  CONTINUING MONTH PAK) 1 MG tablet Take by mouth.     Current Facility-Administered Medications  Medication Dose Route Frequency Provider Last Rate Last Admin   azelastine (ASTELIN) 0.1 % nasal spray 2 spray  2 spray Each Nare BID Theresa Leash, MD        SURGICAL HISTORY:  Past Surgical History:  Procedure Laterality Date   ABDOMINAL HYSTERECTOMY  09/19/2001   hx. of fibroids. TAH. Ovaries remain.    BACK SURGERY  06/2013   CHEST TUBE INSERTION   04/01/08   L hydropneumothorax   EYE SURGERY Bilateral    lens implant   KNEE ARTHROSCOPY  10-17-11   bil. knee scope(left was torn ligament)   LUMBAR LAMINECTOMY/DECOMPRESSION MICRODISCECTOMY  10/27/2011   Procedure: LUMBAR LAMINECTOMY/DECOMPRESSION MICRODISCECTOMY;  Surgeon: Theresa Docker, MD;  Location: [facility];  Service: Orthopedics;  Laterality: N/A;  L4-L5   lung surgery  03/24/08   L vats, L thoracotomy and LUL trisegmentectomy with node dissection   MAXIMUM ACCESS (MAS)POSTERIOR LUMBAR INTERBODY FUSION (PLIF) 1 LEVEL N/A 07/03/2013   Procedure: FOR MAXIMUM ACCESS (MAS) POSTERIOR LUMBAR INTERBODY FUSION (PLIF) 1 LEVEL;  Surgeon: Theresa Alert, MD;  Location: [facility];  Service: Neurosurgery;  Laterality: N/A;  FOR MAXIMUM ACCESS (MAS) POSTERIOR LUMBAR INTERBODY FUSION (PLIF) 1 LEVEL (L4-L5)   PORT-A-CATH REMOVAL Right 11/26/2013   Procedure: REMOVAL PORT-A-CATH;  Surgeon: Theresa Borne, MD;  Location: [facility] OR;  Service: Thoracic;  Laterality: Right;   PORTACATH PLACEMENT  02/11/2011   Procedure: INSERTION PORT-A-CATH;  Surgeon: Theresa Blizzard, MD;  Location: [facility] OR;  Service: Thoracic;  Laterality: Right;  9.6Fr. Pre-attached Power Port in Right Internal Jugular  -right chest-remains inplace 10-17-11   SPINAL CORD STIMULATOR INSERTION N/A 02/03/2016   Procedure: LUMBAR SPINAL CORD STIMULATOR INSERTION;  Surgeon: Theresa Alert, MD;  Location: [facility] OR;  Service: Neurosurgery;  Laterality: N/A;    REVIEW OF SYSTEMS:   Review of Systems   Constitutional: Negative for appetite change, chills, fatigue, fever and unexpected weight change.  HENT:   Negative for mouth sores, nosebleeds, sore throat and trouble swallowing.   Eyes: Negative for eye problems and icterus.  Respiratory: Negative for cough, hemoptysis, shortness of breath and wheezing.   Cardiovascular: Negative for chest pain and leg swelling.  Gastrointestinal: Negative for abdominal pain, constipation, diarrhea, nausea and vomiting.  Genitourinary: Negative for bladder incontinence, difficulty urinating, dysuria, frequency and hematuria.   Musculoskeletal: Negative for back pain, gait problem, neck pain and neck stiffness.  Skin: Negative for itching and rash.  Neurological:  Negative for dizziness, extremity weakness, gait problem, headaches, light-headedness and seizures.  Hematological: Negative for adenopathy. Does not bruise/bleed easily.  Psychiatric/Behavioral: Negative for confusion, depression and sleep disturbance. The patient is not nervous/anxious.     PHYSICAL EXAMINATION:  There were no vitals taken for this visit.  ECOG PERFORMANCE STATUS: {CHL ONC ECOG [id number]  Physical Exam  Constitutional: Oriented to person, place, and time and well-developed, well-nourished, and in no distress. No distress.  HENT:  Head: Normocephalic and atraumatic.  Mouth/Throat: Oropharynx is clear and moist. No oropharyngeal exudate.  Eyes: Conjunctivae are normal. Right eye exhibits no discharge. Left eye exhibits no discharge. No scleral icterus.  Neck: Normal range of motion. Neck supple.  Cardiovascular: Normal rate, regular rhythm, normal heart sounds and intact distal pulses.   Pulmonary/Chest: Effort normal and breath sounds normal. No respiratory distress. No wheezes. No rales.  Abdominal: Soft. Bowel sounds are normal. Exhibits no distension and no mass. There is no tenderness.  Musculoskeletal: Normal range of motion. Exhibits no edema.  Lymphadenopathy:     No cervical adenopathy.  Neurological: [name] and oriented to person, place, and time. Exhibits normal muscle tone. Gait normal. Coordination normal.  Skin: Skin is warm and dry. No rash noted. Not diaphoretic. No erythema. No pallor.  Psychiatric: Mood, memory and judgment normal.  Vitals reviewed.  LABORATORY DATA: Lab Results  Component Value Date   WBC 10.7 (H) 09/24/2021   HGB 16.3 (H) 09/24/2021   HCT 48.4 (H) 09/24/2021   MCV 87.8 09/24/2021   PLT 233 09/24/2021      Chemistry      Component Value Date/Time   NA 138 09/24/2021 1044   NA 143 03/24/2021 1209   NA 139 02/06/2015 0901   K 3.4 (L) 09/24/2021 1044   K 3.9 02/06/2015 0901   CL 102 09/24/2021 1044   CL 102 01/18/2012 0805   CO2 29 09/24/2021 1044   CO2 28 02/06/2015 0901   BUN 16 09/24/2021 1044   BUN 11 03/24/2021 1209   BUN 10.8 02/06/2015 0901   CREATININE 1.68 (H) 09/24/2021 1044   CREATININE 1.5 (H) 02/06/2015 0901      Component Value Date/Time   CALCIUM 9.7 09/24/2021 1044   CALCIUM 9.6 02/06/2015 0901   ALKPHOS 61 09/24/2021 1044   ALKPHOS 71 02/06/2015 0901   AST 11 (L) 09/24/2021 1044   AST 12 02/06/2015 0901   ALT 14 09/24/2021 1044   ALT 16 02/06/2015 0901   BILITOT 1.2 09/24/2021 1044   BILITOT 0.87 02/06/2015 0901       RADIOGRAPHIC STUDIES:  CT Maxillofacial LTD WO CM  Result Date: 02/15/2022 CLINICAL DATA:  Headache and rhinitis EXAM: CT PARANASAL SINUS LIMITED WITHOUT CONTRAST TECHNIQUE: Non-contiguous multidetector CT images of the paranasal sinuses were obtained in a single plane without contrast. RADIATION DOSE REDUCTION: This exam was performed according to the departmental dose-optimization program which includes automated exposure control, adjustment of the mA and/or kV according to patient size and/or use of iterative reconstruction technique. COMPARISON:  None Available. FINDINGS: The paranasal sinuses are clear, without mucosal thickening or retained secretions. Mastoid  and middle air cavities are clear. Visible intracranial and extracranial soft tissues are normal. IMPRESSION: Clear paranasal sinuses. Electronically Signed   By: Deatra Robinson M.D.   On: 02/15/2022 18:15   CT Chest Wo Contrast  Result Date: 02/15/2022 CLINICAL DATA:  Non-small-cell lung cancer. Restaging. Insert body on EXAM: CT CHEST WITHOUT CONTRAST TECHNIQUE: Multidetector CT imaging of the chest was  performed following the standard protocol without IV contrast. RADIATION DOSE REDUCTION: This exam was performed according to the departmental dose-optimization program which includes automated exposure control, adjustment of the mA and/or kV according to patient size and/or use of iterative reconstruction technique. COMPARISON:  09/24/2021 FINDINGS: Cardiovascular: The heart size is normal. No substantial pericardial effusion. Mild atherosclerotic calcification is noted in the wall of the thoracic aorta. Mediastinum/Nodes: No mediastinal lymphadenopathy. No evidence for gross hilar lymphadenopathy although assessment is limited by the lack of intravenous contrast on the current study. The esophagus has normal imaging features. There is no axillary lymphadenopathy. Lungs/Pleura: Volume loss left hemithorax again noted with stable treatment related changes in the paramediastinal left lung extending into the posterior apex. Superior segment right lower lobe pulmonary nodule measured previously at 15 x 11 mm is now 15 x 9 mm on image 61/7. No new suspicious pulmonary nodule or mass. No pleural effusion. Upper Abdomen: Stable hypoattenuating lesions left kidney compatible with cyst. Musculoskeletal: No worrisome lytic or sclerotic osseous abnormality. Thoracic spinal stimulator device evident. IMPRESSION: 1. Stable exam. No new or progressive findings. 2. Stable treatment related changes in the paramediastinal left lung extending into the posterior apex. 3. Stable 15 x 9 mm superior segment right lower lobe  pulmonary nodule. 4.  Aortic Atherosclerosis (ICD10-I70.0). Electronically Signed   By: Kennith Center M.D.   On: 02/15/2022 09:15     ASSESSMENT/PLAN:  No problem-specific Assessment & Plan notes found for this encounter.   No orders of the defined types were placed in this encounter.    I spent {CHL ONC TIME VISIT - CACLJ:[id number]} counseling the patient face to face. The total time spent in the appointment was {CHL ONC TIME VISIT - ZMLPX:[id number]}.  Marsha Gundlach L Shelitha Magley, PA-C 02/23/22

## 2022-02-24 ENCOUNTER — Ambulatory Visit: Payer: Medicare Other | Admitting: Physician Assistant

## 2022-02-25 ENCOUNTER — Inpatient Hospital Stay: Payer: Medicare Other | Attending: Physician Assistant | Admitting: Physician Assistant

## 2022-02-25 VITALS — BP 157/88 | HR 66 | Temp 100.2°F | Resp 16 | Wt 209.2 lb

## 2022-02-25 DIAGNOSIS — Z08 Encounter for follow-up examination after completed treatment for malignant neoplasm: Secondary | ICD-10-CM | POA: Insufficient documentation

## 2022-02-25 DIAGNOSIS — R911 Solitary pulmonary nodule: Secondary | ICD-10-CM

## 2022-02-25 DIAGNOSIS — Z85118 Personal history of other malignant neoplasm of bronchus and lung: Secondary | ICD-10-CM | POA: Insufficient documentation

## 2022-02-25 DIAGNOSIS — C3492 Malignant neoplasm of unspecified part of left bronchus or lung: Secondary | ICD-10-CM | POA: Diagnosis not present

## 2022-02-25 NOTE — Progress Notes (Signed)
[name], [name], MD [name]  DIAGNOSIS: Recurrent non-small cell lung cancer initially diagnosed as stage IA (T1b, N0, MX) adenocarcinoma in November 2009. She had evidence of recurrent disease in September 2012.   PRIOR THERAPY: #1 Status post left upper lobe trisegmentectomy with lymph node dissection under the care of Dr. Arlyce Dice on March 24, 2009. #2 Concurrent chemoradiation with carboplatin for AUC of 2 and paclitaxel 45 mg/M2 given weekly with radiation in 2012.    CURRENT THERAPY: Observation   INTERVAL HISTORY: Theresa [name] 56 y.o. female returns to the clinic today for a follow-up visit accompanied by her granddaughter.  The patient was last seen in clinic on 10/05/21.  The patient has a history of left sided non-small cell lung cancer.  We are monitoring a suspicious right lower lobe pulmonary nodule for which she previously had a PET scan that did not show any FDG avidity but remain suspicious for an indolent neoplasm.  At her last appointment, she had a repeat CT scan that showed slight interval increase in the right lower lobe lesion over the span of 1 year and Dr. Julien [name] recommended close monitoring.  If this area continues to enlarge we would consider SBRT.  Also at her last appointment the patient was endorsing occasional wheezing, dyspnea, and cough. We referred her to pulmonary medicine and she is seen by Dr. Lake [name].  She is on inhalers for her COPD.  She expressed concern with a 48-monthscan being too long to follow the nodule which was causing anxiety so Dr. MLake Bellsarrange for a short-term follow-up CT scan in December 2023 that showed stable exam without any new or progressive findings and stable 15 x 9 mm superior segment right lower lobe pulmonary nodule.  At her last appointment Dr. MLake Bellsshe was also endorsing a temporal headache and feeling like her "blood vessels going to explode" on the  left side of her face Dr. MLake Bellsordered test for arteritis.  Her ESR and ANCA were normal.   Otherwise the patient denies any fever, chills, night sweats, or unexplained weight loss. She continues to smoke cigarettes. She is having trouble quitting due to anxiety and feeling depressed. She is on Cymbalta and chantix for depression and smoking cessation. 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  She sometimes has nausea attributed to something she ate.  For constipation, she may take Linzess. She sometimes has right rib pain with certain positions such as laying down. She characterizes this as aching. She is here today for evaluation and repeat blood work and to get back on track with her appointments.   MEDICAL HISTORY: Past Medical History:  Diagnosis Date   Anxiety    Arthritis    Blindness, legal    glaucoma and retinitis  pigmentosa   Chronic eczema    Chronic headaches 10-17-11   not migraines- just regular   Constipation    Depression    Diabetes mellitus type II    diet    Family history of anesthesia complication    1 son had malignant hyperthermia at 89yr tonsils rained   Family history of malignant hyperthermia    1 son 16 years ago   Fibromyalgia    denies, has not been diagnosited   GERD (gastroesophageal reflux disease)    Glaucoma    History of chemotherapy 01/2011 to 03/2011   concurrent w/radiation therapy   Hx of  radiation therapy 01-17-11 to 03-18-11   lung   Hyperlipidemia    Hypertension    Lung cancer ([place])    lung ca dx 2010   Malignant hyperthermia    As of 12/30/15, no personal MH history, but reported her son has a history of MH ~ 2000 (no confirmatory testing done)   Onychomycosis    Retinitis pigmentosa    Shortness of breath    with exertion   Sleep apnea    cpap 5 yrs    ALLERGIES:  is allergic to penicillins, ibuprofen, codeine, morphine and related, and tomato.  MEDICATIONS:  Current Outpatient Medications  Medication  Sig Dispense Refill   albuterol (PROAIR HFA) 108 (90 Base) MCG/ACT inhaler Inhale 2 puffs into the lungs every 4 (four) hours as needed for wheezing or shortness of breath. (Patient not taking: Reported on 01/17/2022) 8 g 3   albuterol (PROVENTIL) (2.5 MG/3ML) 0.083% nebulizer solution Take 6 mLs (5 mg total) by nebulization every 6 (six) hours as needed for wheezing or shortness of breath. 150 mL 1   amLODipine (NORVASC) 10 MG tablet TAKE 1 TABLET (10 MG TOTAL) BY MOUTH DAILY. MAKE APPT FOR FUTURE REFILLS. 90 tablet 0   aspirin EC 81 MG tablet Take 1 tablet (81 mg total) by mouth daily. 30 tablet 11   atorvastatin (LIPITOR) 80 MG tablet TAKE 1 TABLET BY MOUTH EVERY DAY 90 tablet 3   baclofen (LIORESAL) 10 MG tablet Oral for 90 Days     budesonide-formoterol (SYMBICORT) 160-4.5 MCG/ACT inhaler Inhale 2 puffs into the lungs in the morning and at bedtime. 10.2 g 6   carvedilol (COREG) 25 MG tablet TAKE 1 TABLET (25 MG TOTAL) BY MOUTH TWICE A DAY WITH MEALS 180 tablet 1   clobetasol cream (TEMOVATE) 1.69 % Apply 1 application topically 2 (two) times daily. 60 g 1   DULoxetine (CYMBALTA) 60 MG capsule NEEDS APPT FOR FUTURE REFILLS. TAKE 1 CAPSULE BY MOUTH EVERY DAY 90 capsule 2   fluticasone (FLONASE) 50 MCG/ACT nasal spray SPRAY 2 SPRAYS INTO EACH NOSTRIL EVERY DAY 48 mL 2   gabapentin (NEURONTIN) 300 MG capsule TAKE 1 CAPSULE BY MOUTH THREE TIMES A DAY 270 capsule 1   insulin glargine (LANTUS SOLOSTAR) 100 UNIT/ML Solostar Pen INJECT 20 UNITS INTO THE SKIN EVERY MORNING. 15 mL 3   Insulin Pen Needle (PEN NEEDLES) 32G X 4 MM MISC 1 Stick by Does not apply route daily. 100 each 3   linaclotide (LINZESS) 290 MCG CAPS capsule Take 1 capsule (290 mcg total) by mouth daily. REQUIRES VISIT BEFORE NEXT REFILL 45 capsule 0   metFORMIN (GLUCOPHAGE) 1000 MG tablet TAKE 1 TABLET (1,000 MG TOTAL) BY MOUTH TWICE A DAY WITH FOOD 180 tablet 0   metFORMIN (GLUCOPHAGE) 500 MG tablet Take 2 tablets (1,000 mg total) by  mouth 2 (two) times daily with a meal. 120 tablet 6   montelukast (SINGULAIR) 10 MG tablet Take 1 tablet (10 mg total) by mouth at bedtime. 90 tablet 3   omeprazole (PRILOSEC) 20 MG capsule TAKE 1 CAPSULE BY MOUTH EVERY DAY 15 capsule 0   oxymetazoline (AFRIN NASAL SPRAY) 0.05 % nasal spray Place 1 spray into both nostrils 2 (two) times daily. 30 mL 0   Spacer/Aero-Holding Chambers (AEROCHAMBER MV) inhaler Use as instructed 1 each 0   varenicline (CHANTIX CONTINUING MONTH PAK) 1 MG tablet Take by mouth.     Current Facility-Administered Medications  Medication Dose Route Frequency Provider Last Rate Last Admin  azelastine (ASTELIN) 0.1 % nasal spray 2 spray  2 spray Each Nare BID Juanito Doom, MD        SURGICAL HISTORY:  Past Surgical History:  Procedure Laterality Date   ABDOMINAL HYSTERECTOMY  09/19/2001   hx. of fibroids. TAH. Ovaries remain.    BACK SURGERY  06/2013   CHEST TUBE INSERTION   04/01/08   L hydropneumothorax   EYE SURGERY Bilateral    lens implant   KNEE ARTHROSCOPY  10-17-11   bil. knee scope(left was torn ligament)   LUMBAR LAMINECTOMY/DECOMPRESSION MICRODISCECTOMY  10/27/2011   Procedure: LUMBAR LAMINECTOMY/DECOMPRESSION MICRODISCECTOMY;  Surgeon: Johnn Hai, MD;  Location: [facility];  Service: Orthopedics;  Laterality: N/A;  L4-L5   lung surgery  03/24/08   L vats, L thoracotomy and LUL trisegmentectomy with node dissection   MAXIMUM ACCESS (MAS)POSTERIOR LUMBAR INTERBODY FUSION (PLIF) 1 LEVEL N/A 07/03/2013   Procedure: FOR MAXIMUM ACCESS (MAS) POSTERIOR LUMBAR INTERBODY FUSION (PLIF) 1 LEVEL;  Surgeon: Eustace Moore, MD;  Location: [facility];  Service: Neurosurgery;  Laterality: N/A;  FOR MAXIMUM ACCESS (MAS) POSTERIOR LUMBAR INTERBODY FUSION (PLIF) 1 LEVEL (L4-L5)   PORT-A-CATH REMOVAL Right 11/26/2013   Procedure: REMOVAL PORT-A-CATH;  Surgeon: Gaye Pollack, MD;  Location: [facility];  Service: Thoracic;  Laterality: Right;   PORTACATH PLACEMENT  02/11/2011    Procedure: INSERTION PORT-A-CATH;  Surgeon: Pierre Bali, MD;  Location: [facility];  Service: Thoracic;  Laterality: Right;  9.6Fr. Pre-attached Power Port in Right Internal Jugular  -right chest-remains inplace 10-17-11   SPINAL CORD STIMULATOR INSERTION N/A 02/03/2016   Procedure: LUMBAR SPINAL CORD STIMULATOR INSERTION;  Surgeon: Eustace Moore, MD;  Location: [facility];  Service: Neurosurgery;  Laterality: N/A;    REVIEW OF SYSTEMS:   Review of Systems  Constitutional: Positive for chronic fatigue. Negative for appetite change, chills, fever and unexpected weight change.  HENT:   Negative for mouth sores, nosebleeds, sore throat and trouble swallowing.   Eyes: Negative for eye problems and icterus.  Respiratory: Positive for chronic cough, shortness of breath, and intermittent wheezing. Negative for hemoptysis.  Cardiovascular: Positive for right lower chest discomfort/aching (none at this time). Negative for leg swelling.  Gastrointestinal: Positive for occasional constipation or nausea. Negative for abdominal pain, diarrhea, and vomiting.  Genitourinary: Negative for bladder incontinence, difficulty urinating, dysuria, frequency and hematuria.   Musculoskeletal: Positive for chronic back pain. Negative for gait problem, neck pain and neck stiffness.  Skin: Negative for itching and rash.  Neurological: Negative for dizziness, extremity weakness, gait problem, headaches, light-headedness and seizures.  Hematological: Negative for adenopathy. Does not bruise/bleed easily.  Psychiatric/Behavioral: Negative for confusion, depression and sleep disturbance. The patient is not nervous/anxious.   PHYSICAL EXAMINATION:  There were no vitals taken for this visit.  ECOG PERFORMANCE STATUS: 1  Physical Exam  Constitutional: Oriented to person, place, and time and well-developed, well-nourished, and in no distress.  HENT:  Head: Normocephalic and atraumatic.  Mouth/Throat: Oropharynx is clear and  moist. No oropharyngeal exudate.  Eyes: Conjunctivae are normal. Right eye exhibits no discharge. Left eye exhibits no discharge. No scleral icterus.  Neck: Normal range of motion. Neck supple.  Cardiovascular: Normal rate, regular rhythm, normal heart sounds and intact distal pulses.   Pulmonary/Chest: Effort normal and breath sounds normal. No respiratory distress. No wheezes. No rales.  Abdominal: Soft. Bowel sounds are normal. Exhibits no distension and no mass. There is no tenderness.  Musculoskeletal: Normal range of motion. Exhibits  no edema.  Lymphadenopathy:    No cervical adenopathy.  Neurological: Alert and oriented to person, place, and time. Exhibits normal muscle tone.  Examined in the wheelchair.  Skin: Skin is warm and dry. No rash noted. Not diaphoretic. No erythema. No pallor.  Psychiatric: Mood, memory and judgment normal.  Vitals reviewed.  LABORATORY DATA: Lab Results  Component Value Date   WBC 10.7 (H) 09/24/2021   HGB 16.3 (H) 09/24/2021   HCT 48.4 (H) 09/24/2021   MCV 87.8 09/24/2021   PLT 233 09/24/2021      Chemistry      Component Value Date/Time   NA 138 09/24/2021 1044   NA 143 03/24/2021 1209   NA 139 02/06/2015 0901   K 3.4 (L) 09/24/2021 1044   K 3.9 02/06/2015 0901   CL 102 09/24/2021 1044   CL 102 01/18/2012 0805   CO2 29 09/24/2021 1044   CO2 28 02/06/2015 0901   BUN 16 09/24/2021 1044   BUN 11 03/24/2021 1209   BUN 10.8 02/06/2015 0901   CREATININE 1.68 (H) 09/24/2021 1044   CREATININE 1.5 (H) 02/06/2015 0901      Component Value Date/Time   CALCIUM 9.7 09/24/2021 1044   CALCIUM 9.6 02/06/2015 0901   ALKPHOS 61 09/24/2021 1044   ALKPHOS 71 02/06/2015 0901   AST 11 (L) 09/24/2021 1044   AST 12 02/06/2015 0901   ALT 14 09/24/2021 1044   ALT 16 02/06/2015 0901   BILITOT 1.2 09/24/2021 1044   BILITOT 0.87 02/06/2015 0901       RADIOGRAPHIC STUDIES:  CT Maxillofacial LTD WO CM  Result Date: 02/15/2022 CLINICAL DATA:   Headache and rhinitis EXAM: CT PARANASAL SINUS LIMITED WITHOUT CONTRAST TECHNIQUE: Non-contiguous multidetector CT images of the paranasal sinuses were obtained in a single plane without contrast. RADIATION DOSE REDUCTION: This exam was performed according to the departmental dose-optimization program which includes automated exposure control, adjustment of the mA and/or kV according to patient size and/or use of iterative reconstruction technique. COMPARISON:  None Available. FINDINGS: The paranasal sinuses are clear, without mucosal thickening or retained secretions. Mastoid and middle air cavities are clear. Visible intracranial and extracranial soft tissues are normal. IMPRESSION: Clear paranasal sinuses. Electronically Signed   By: Ulyses Jarred M.D.   On: 02/15/2022 18:15   CT Chest Wo Contrast  Result Date: 02/15/2022 CLINICAL DATA:  Non-small-cell lung cancer. Restaging. Insert body on EXAM: CT CHEST WITHOUT CONTRAST TECHNIQUE: Multidetector CT imaging of the chest was performed following the standard protocol without IV contrast. RADIATION DOSE REDUCTION: This exam was performed according to the departmental dose-optimization program which includes automated exposure control, adjustment of the mA and/or kV according to patient size and/or use of iterative reconstruction technique. COMPARISON:  09/24/2021 FINDINGS: Cardiovascular: The heart size is normal. No substantial pericardial effusion. Mild atherosclerotic calcification is noted in the wall of the thoracic aorta. Mediastinum/Nodes: No mediastinal lymphadenopathy. No evidence for gross hilar lymphadenopathy although assessment is limited by the lack of intravenous contrast on the current study. The esophagus has normal imaging features. There is no axillary lymphadenopathy. Lungs/Pleura: Volume loss left hemithorax again noted with stable treatment related changes in the paramediastinal left lung extending into the posterior apex. Superior segment  right lower lobe pulmonary nodule measured previously at 15 x 11 mm is now 15 x 9 mm on image 61/7. No new suspicious pulmonary nodule or mass. No pleural effusion. Upper Abdomen: Stable hypoattenuating lesions left kidney compatible with cyst. Musculoskeletal: No worrisome lytic  or sclerotic osseous abnormality. Thoracic spinal stimulator device evident. IMPRESSION: 1. Stable exam. No new or progressive findings. 2. Stable treatment related changes in the paramediastinal left lung extending into the posterior apex. 3. Stable 15 x 9 mm superior segment right lower lobe pulmonary nodule. 4.  Aortic Atherosclerosis (ICD10-I70.0). Electronically Signed   By: Misty Stanley M.D.   On: 02/15/2022 09:15     ASSESSMENT/PLAN:  This is a very pleasant 56 year old African-American female with a history of non-small cell lung cancer, adenocarcinoma.  She was initially diagnosed in 2009.  She underwent a left trisegmentectomy under the care of Dr. Arlyce Dice.  She had evidence of recurrent disease in recurrence in 2012 and she underwent a course of concurrent chemoradiation.   In early June 2021 the patient was found to have suspicious right lower lobe pulmonary nodules concerning for disease metastasis versus metachronous primary.   She had a PET scan which showed no hypermetabolic activity in either the new right lower lobe pulmonary nodule which is reassuring but low-grade or well differentiated neoplasm cannot be completely excluded.  he patient recently had a restaging CT scan performed.  Dr. Julien [name] personally and independently reviewed the scan and discussed the results with the patient today. The scan showed no evidence of disease progression.  The nodule in the right lung is stable in the interval.  Dr. Julien [name] recommends that the patient continue on observation with a restaging CT scan of the chest.  Dr. Julien [name] gave the patient the option of a follow-up CT scan in 1 year versus 6 months.  The patient  initially opted to have a repeat CT scan in 1 year.  However, by Dr. Julien [name] leaving the room the patient changed her mind and she requested 76-monthfollow-up for close monitoring.  I have arranged for this to be performed without contrast due to renal insufficiency.  We will see her back for follow-up at that time for evaluation to review her scan results.   The patient was strongly encouraged to quit smoking.  We also briefly discussed her smoking related to her depressed mood.  The patient often does not leave the house.  Discussed that exercise may be good for stress reduction and depressed mood and anxiety.  The patient has some constraints with mobility due to her chronic back pain and body habitus.  Encouraged her to look into water aerobics or stationary bikes which would be easier on her joints.   The patient was advised to call immediately if she has any concerning symptoms in the interval. The patient voices understanding of current disease status and treatment options and is in agreement with the current care plan. All questions were answered. The patient knows to call the clinic with any problems, questions or concerns. We can certainly see the patient much sooner if necessary       No orders of the defined types were placed in this encounter.    Theresa Kamaka L Kylene Zamarron, PA-C 02/25/22  ADDENDUM: Hematology/Oncology Attending: I had a face-to-face encounter with the patient today.  I reviewed her records, lab, scan and recommended her care plan.  This is a very pleasant 56years old African-American female with history of non-small cell lung cancer, adenocarcinoma that was initially diagnosed as a stage Ia in 2009 status post left trisegmentectomy under the care of Dr. BJearld Fenton  The patient had evidence for disease recurrence in 2012 and she underwent a course of concurrent chemoradiation. She has been on observation since that time and  she is feeling fine with no concerning  complaints. She had repeat CT scan of the chest performed recently.  I personally and independently reviewed the scan and discussed the result with the patient today. Her scan showed no concerning findings for disease recurrence or metastasis. I recommended for her to continue on observation with repeat CT scan of the chest in 1 year. She was advised to call immediately if she has any other concerning symptoms in the interval. The total time spent in the appointment was 20 minutes. Disclaimer: This note was dictated with voice recognition software. Similar sounding words can inadvertently be transcribed and may be missed upon review. Eilleen Kempf, MD

## 2022-03-18 ENCOUNTER — Other Ambulatory Visit: Payer: Self-pay

## 2022-03-18 MED ORDER — DULOXETINE HCL 60 MG PO CPEP: ORAL_CAPSULE | ORAL | 2 refills | Status: DC

## 2022-04-07 ENCOUNTER — Telehealth: Payer: Self-pay | Admitting: Internal Medicine

## 2022-04-07 ENCOUNTER — Other Ambulatory Visit: Payer: Medicare Other

## 2022-04-07 NOTE — Telephone Encounter (Signed)
Called patient regarding upcoming June appointments, patient is notified.  

## 2022-04-11 ENCOUNTER — Other Ambulatory Visit: Payer: Self-pay | Admitting: Family Medicine

## 2022-04-11 DIAGNOSIS — I10 Essential (primary) hypertension: Secondary | ICD-10-CM

## 2022-04-11 DIAGNOSIS — E785 Hyperlipidemia, unspecified: Secondary | ICD-10-CM

## 2022-04-14 ENCOUNTER — Ambulatory Visit: Payer: Medicare Other | Admitting: Internal Medicine

## 2022-08-01 ENCOUNTER — Other Ambulatory Visit: Payer: Self-pay | Admitting: Family Medicine

## 2022-08-01 DIAGNOSIS — E1165 Type 2 diabetes mellitus with hyperglycemia: Secondary | ICD-10-CM

## 2022-08-29 ENCOUNTER — Inpatient Hospital Stay: Payer: Medicare Other | Attending: Family Medicine

## 2022-08-29 ENCOUNTER — Ambulatory Visit (HOSPITAL_COMMUNITY): Payer: Medicare Other

## 2022-08-31 ENCOUNTER — Inpatient Hospital Stay: Payer: Medicare Other | Admitting: Internal Medicine

## 2022-09-02 ENCOUNTER — Other Ambulatory Visit: Payer: Self-pay

## 2022-09-02 ENCOUNTER — Other Ambulatory Visit: Payer: Self-pay | Admitting: Family Medicine

## 2022-09-02 ENCOUNTER — Telehealth: Payer: Self-pay | Admitting: Medical Oncology

## 2022-09-02 DIAGNOSIS — E1165 Type 2 diabetes mellitus with hyperglycemia: Secondary | ICD-10-CM

## 2022-09-02 DIAGNOSIS — I10 Essential (primary) hypertension: Secondary | ICD-10-CM

## 2022-09-02 MED ORDER — AMLODIPINE BESYLATE 10 MG PO TABS: 10.0000 mg | ORAL_TABLET | Freq: Every day | ORAL | 0 refills | Status: DC

## 2022-09-02 MED ORDER — ATORVASTATIN CALCIUM 80 MG PO TABS: 80.0000 mg | ORAL_TABLET | Freq: Every day | ORAL | 3 refills | Status: DC

## 2022-09-02 NOTE — Telephone Encounter (Signed)
Pt notified to call CS and to get labs and CT next week. I sent schedule message for lab appt 1 hour before scan and f//u 3-4 days later. She is visually impaired so I gave the  number to her son. She voiced understanding.

## 2022-09-02 NOTE — Telephone Encounter (Signed)
-----   Message from Kell[place], PA-C sent at 09/01/2022  8:17 AM EDT ----- Regarding: RE: Reschedule CT/Appt Would you guys be able to try to reach her about scheduling her CT scan? I want to make sure we don't lose her to follow up. She will need scheduling message sent to be seen in the office a week or so after her CT gets scheduled.  ----- Message ----- From: Malissa Hippo, RN Sent: 08/29/2022   4:00 PM EDT To: Johnette Abraham Heilingoetter, PA-C; # Subject: Reschedule CT/Appt                             I have called patient and left a message with Central Scheduling information. She needs to reschedule the missed CT Scan today and have a follow up about a week after with Dr. Arbutus Ped.   Can you cancel Wednesday's appt please? Thanks Huntley Dec

## 2022-09-06 ENCOUNTER — Telehealth: Payer: Self-pay | Admitting: Physician Assistant

## 2022-09-06 NOTE — Telephone Encounter (Signed)
Scheduled per scheduling message, patient has been called and notified of July appointments.

## 2022-09-20 ENCOUNTER — Other Ambulatory Visit: Payer: Self-pay

## 2022-09-20 ENCOUNTER — Inpatient Hospital Stay: Payer: Medicare Other | Attending: Family Medicine

## 2022-09-20 ENCOUNTER — Ambulatory Visit (HOSPITAL_COMMUNITY)
Admission: RE | Admit: 2022-09-20 | Discharge: 2022-09-20 | Disposition: A | Discharge status: Home / Self Care | Payer: Medicare Other | Source: Ambulatory Visit | Attending: Physician Assistant | Admitting: Physician Assistant

## 2022-09-20 DIAGNOSIS — R911 Solitary pulmonary nodule: Secondary | ICD-10-CM

## 2022-09-20 DIAGNOSIS — J449 Chronic obstructive pulmonary disease, unspecified: Secondary | ICD-10-CM | POA: Insufficient documentation

## 2022-09-20 DIAGNOSIS — K5909 Other constipation: Secondary | ICD-10-CM | POA: Insufficient documentation

## 2022-09-20 DIAGNOSIS — H3552 Pigmentary retinal dystrophy: Secondary | ICD-10-CM | POA: Diagnosis not present

## 2022-09-20 DIAGNOSIS — Z85118 Personal history of other malignant neoplasm of bronchus and lung: Secondary | ICD-10-CM | POA: Diagnosis present

## 2022-09-20 DIAGNOSIS — C3492 Malignant neoplasm of unspecified part of left bronchus or lung: Secondary | ICD-10-CM

## 2022-09-20 DIAGNOSIS — Z08 Encounter for follow-up examination after completed treatment for malignant neoplasm: Secondary | ICD-10-CM | POA: Insufficient documentation

## 2022-09-20 LAB — CBC WITH DIFFERENTIAL (CANCER CENTER ONLY)
Abs Immature Granulocytes: 0.02 10*3/uL (ref 0.00–0.07)
Basophils Absolute: 0 10*3/uL (ref 0.0–0.1)
Basophils Relative: 0 %
Eosinophils Absolute: 0.4 10*3/uL (ref 0.0–0.5)
Eosinophils Relative: 5 %
HCT: 46.2 % — ABNORMAL HIGH (ref 36.0–46.0)
Hemoglobin: 15.2 g/dL — ABNORMAL HIGH (ref 12.0–15.0)
Immature Granulocytes: 0 %
Lymphocytes Relative: 29 %
Lymphs Abs: 2.1 10*3/uL (ref 0.7–4.0)
MCH: 30.4 pg (ref 26.0–34.0)
MCHC: 32.9 g/dL (ref 30.0–36.0)
MCV: 92.4 fL (ref 80.0–100.0)
Monocytes Absolute: 0.6 10*3/uL (ref 0.1–1.0)
Monocytes Relative: 9 %
Neutro Abs: 4.1 10*3/uL (ref 1.7–7.7)
Neutrophils Relative %: 57 %
Platelet Count: 215 10*3/uL (ref 150–400)
RBC: 5 MIL/uL (ref 3.87–5.11)
RDW: 13.2 % (ref 11.5–15.5)
WBC Count: 7.2 10*3/uL (ref 4.0–10.5)
nRBC: 0 % (ref 0.0–0.2)

## 2022-09-20 LAB — CMP (CANCER CENTER ONLY)
ALT: 16 U/L (ref 0–44)
AST: 15 U/L (ref 15–41)
Albumin: 4 g/dL (ref 3.5–5.0)
Alkaline Phosphatase: 56 U/L (ref 38–126)
Anion gap: 7 (ref 5–15)
BUN: 18 mg/dL (ref 6–20)
CO2: 28 mmol/L (ref 22–32)
Calcium: 9.4 mg/dL (ref 8.9–10.3)
Chloride: 105 mmol/L (ref 98–111)
Creatinine: 1.69 mg/dL — ABNORMAL HIGH (ref 0.44–1.00)
GFR, Estimated: 35 mL/min — ABNORMAL LOW (ref >60)
Glucose, Bld: 133 mg/dL — ABNORMAL HIGH (ref 70–99)
Potassium: 3.4 mmol/L — ABNORMAL LOW (ref 3.5–5.1)
Sodium: 140 mmol/L (ref 135–145)
Total Bilirubin: 0.6 mg/dL (ref 0.3–1.2)
Total Protein: 6.9 g/dL (ref 6.5–8.1)

## 2022-09-24 NOTE — Progress Notes (Signed)
[facility] OFFICE PROGRESS NOTE  Theresa Drafts, MD [address] [place] [address]  DIAGNOSIS: Recurrent non-small cell lung cancer initially diagnosed as stage IA (T1b, N0, MX) adenocarcinoma in November 2009. She had evidence of recurrent disease in September 2012.    PRIOR THERAPY:   #1 Status post left upper lobe trisegmentectomy with lymph node dissection under the care of Dr. Edwyna Shell on March 24, 2009. #2 Concurrent chemoradiation with carboplatin for AUC of 2 and paclitaxel 45 mg/M2 given weekly with radiation in 2012.    CURRENT THERAPY: Observation   INTERVAL HISTORY: Theresa [name] 57 y.o. female returns to the clinic today for a follow-up visit accompanied by her daughter. The patient was last seen in clinic on 02/25/22.  The patient has a history of left sided non-small cell lung cancer.  We are monitoring a suspicious right lower lobe pulmonary nodule for which she previously had a PET scan that did not show any FDG avidity but remain suspicious for an indolent neoplasm. If it enlarges, we will consider SBRT.   She sees pulmonary medicine for her COPD. She continues to smoke but has cut back to 1 ppd. She had had a lot of trouble quitting in the past.  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 She needs to make an appointment with a new eye specialist. She has the number at home but needs to still call.  Denies any fever or chills.  She reports that she lost approximately 10 pounds in the last month due to eating less since the weather is hotter.  She has chronic dyspnea on exertion and chronic cough which produces phlegm.  She does not look at the color of her phlegm so to her knowledge she is not sure if she has any hemoptysis.  Denies any nausea, vomiting, or diarrhea.  She has chronic constipation and takes Linzess.  Denies any unusual headaches. She recently had a restaging CT scan performed. She is here today for evaluation and to review  her scan results.   MEDICAL HISTORY: Past Medical History:  Diagnosis Date   Anxiety    Arthritis    Blindness, legal    glaucoma and retinitis  pigmentosa   Chronic eczema    Chronic headaches 10-17-11   not migraines- just regular   Constipation    Depression    Diabetes mellitus type II    diet    Family history of anesthesia complication    1 son had malignant hyperthermia at 66yrs  tonsils rained   Family history of malignant hyperthermia    1 son 16 years ago   Fibromyalgia    denies, has not been diagnosited   GERD (gastroesophageal reflux disease)    Glaucoma    History of chemotherapy 01/2011 to 03/2011   concurrent w/radiation therapy   Hx of radiation therapy 01-17-11 to 03-18-11   lung   Hyperlipidemia    Hypertension    Lung cancer (HCC)    lung ca dx 2010   Malignant hyperthermia    As of 12/30/15, no personal MH history, but reported her son has a history of MH ~ 2000 (no confirmatory testing done)   Onychomycosis    Retinitis pigmentosa    Shortness of breath    with exertion   Sleep apnea    cpap 5 yrs    ALLERGIES:  is allergic to penicillins, ibuprofen, codeine, morphine and codeine, and tomato.  MEDICATIONS:  Current Outpatient  Medications  Medication Sig Dispense Refill   albuterol (PROAIR HFA) 108 (90 Base) MCG/ACT inhaler Inhale 2 puffs into the lungs every 4 (four) hours as needed for wheezing or shortness of breath. (Patient not taking: Reported on 01/17/2022) 8 g 3   albuterol (PROVENTIL) (2.5 MG/3ML) 0.083% nebulizer solution Take 6 mLs (5 mg total) by nebulization every 6 (six) hours as needed for wheezing or shortness of breath. 150 mL 1   amLODipine (NORVASC) 10 MG tablet Take 1 tablet (10 mg total) by mouth daily. Make appt for future refills. 90 tablet 0   aspirin EC 81 MG tablet Take 1 tablet (81 mg total) by mouth daily. 30 tablet 11   atorvastatin (LIPITOR) 80 MG tablet Take 1 tablet (80 mg total) by mouth daily. 90 tablet 3   baclofen  (LIORESAL) 10 MG tablet Oral for 90 Days     budesonide-formoterol (SYMBICORT) 160-4.5 MCG/ACT inhaler Inhale 2 puffs into the lungs in the morning and at bedtime. 10.2 g 6   carvedilol (COREG) 25 MG tablet TAKE 1 TABLET (25 MG TOTAL) BY MOUTH TWICE A DAY WITH MEALS 180 tablet 1   clobetasol cream (TEMOVATE) 0.05 % Apply 1 application topically 2 (two) times daily. 60 g 1   DULoxetine (CYMBALTA) 60 MG capsule NEEDS APPT FOR FUTURE REFILLS. TAKE 1 CAPSULE BY MOUTH EVERY DAY 90 capsule 2   fluticasone (FLONASE) 50 MCG/ACT nasal spray SPRAY 2 SPRAYS INTO EACH NOSTRIL EVERY DAY 48 mL 2   gabapentin (NEURONTIN) 300 MG capsule TAKE 1 CAPSULE BY MOUTH THREE TIMES A DAY 270 capsule 1   insulin glargine (LANTUS SOLOSTAR) 100 UNIT/ML Solostar Pen INJECT 20 UNITS INTO THE SKIN EVERY MORNING. 15 mL 3   Insulin Pen Needle (PEN NEEDLES) 32G X 4 MM MISC 1 Stick by Does not apply route daily. 100 each 3   linaclotide (LINZESS) 290 MCG CAPS capsule Take 1 capsule (290 mcg total) by mouth daily. REQUIRES VISIT BEFORE NEXT REFILL 45 capsule 0   metFORMIN (GLUCOPHAGE) 1000 MG tablet TAKE 1 TABLET (1,000 MG TOTAL) BY MOUTH TWICE A DAY WITH FOOD 180 tablet 1   metFORMIN (GLUCOPHAGE) 500 MG tablet Take 2 tablets (1,000 mg total) by mouth 2 (two) times daily with a meal. 120 tablet 6   montelukast (SINGULAIR) 10 MG tablet Take 1 tablet (10 mg total) by mouth at bedtime. 90 tablet 3   omeprazole (PRILOSEC) 20 MG capsule TAKE 1 CAPSULE BY MOUTH EVERY DAY 15 capsule 0   oxymetazoline (AFRIN NASAL SPRAY) 0.05 % nasal spray Place 1 spray into both nostrils 2 (two) times daily. 30 mL 0   Spacer/Aero-Holding Chambers (AEROCHAMBER MV) inhaler Use as instructed 1 each 0   varenicline (CHANTIX CONTINUING MONTH PAK) 1 MG tablet Take by mouth.     Current Facility-Administered Medications  Medication Dose Route Frequency Provider Last Rate Last Admin   azelastine (ASTELIN) 0.1 % nasal spray 2 spray  2 spray Each Nare BID Lupita Leash, MD        SURGICAL HISTORY:  Past Surgical History:  Procedure Laterality Date   ABDOMINAL HYSTERECTOMY  09/19/2001   hx. of fibroids. TAH. Ovaries remain.    BACK SURGERY  06/2013   CHEST TUBE INSERTION   04/01/08   L hydropneumothorax   EYE SURGERY Bilateral    lens implant   KNEE ARTHROSCOPY  10-17-11   bil. knee scope(left was torn ligament)   LUMBAR LAMINECTOMY/DECOMPRESSION MICRODISCECTOMY  10/27/2011   Procedure: LUMBAR  LAMINECTOMY/DECOMPRESSION MICRODISCECTOMY;  Surgeon: Javier Docker, MD;  Location: [facility];  Service: Orthopedics;  Laterality: N/A;  L4-L5   lung surgery  03/24/08   L vats, L thoracotomy and LUL trisegmentectomy with node dissection   MAXIMUM ACCESS (MAS)POSTERIOR LUMBAR INTERBODY FUSION (PLIF) 1 LEVEL N/A 07/03/2013   Procedure: FOR MAXIMUM ACCESS (MAS) POSTERIOR LUMBAR INTERBODY FUSION (PLIF) 1 LEVEL;  Surgeon: Tia Alert, MD;  Location: [facility];  Service: Neurosurgery;  Laterality: N/A;  FOR MAXIMUM ACCESS (MAS) POSTERIOR LUMBAR INTERBODY FUSION (PLIF) 1 LEVEL (L4-L5)   PORT-A-CATH REMOVAL Right 11/26/2013   Procedure: REMOVAL PORT-A-CATH;  Surgeon: Alleen Borne, MD;  Location: [facility] OR;  Service: Thoracic;  Laterality: Right;   PORTACATH PLACEMENT  02/11/2011   Procedure: INSERTION PORT-A-CATH;  Surgeon: Norton Blizzard, MD;  Location: [facility] OR;  Service: Thoracic;  Laterality: Right;  9.6Fr. Pre-attached Power Port in Right Internal Jugular  -right chest-remains inplace 10-17-11   SPINAL CORD STIMULATOR INSERTION N/A 02/03/2016   Procedure: LUMBAR SPINAL CORD STIMULATOR INSERTION;  Surgeon: Tia Alert, MD;  Location: [facility] OR;  Service: Neurosurgery;  Laterality: N/A;    REVIEW OF SYSTEMS:   Constitutional: Positive for chronic fatigue. Negative for appetite change, chills, fever and unexpected weight change.  HENT:   Negative for mouth sores, nosebleeds, sore throat and trouble swallowing.   Eyes: Positive for retinitis pigmentosa.  Negative for eye  problems and icterus.  Respiratory: Positive for chronic cough, shortness of breath, and intermittent wheezing. Negative for hemoptysis.  Cardiovascular:  Negative chest pain or for leg swelling.  Gastrointestinal: Positive for occasional constipation. Negative for abdominal pain, diarrhea, and vomiting.  Genitourinary: Negative for bladder incontinence, difficulty urinating, dysuria, frequency and hematuria.   Musculoskeletal: Positive for chronic back pain. Negative for gait problem, neck pain and neck stiffness.  Skin: Negative for itching and rash.  Neurological: Negative for dizziness, extremity weakness, gait problem, headaches, light-headedness and seizures.  Hematological: Negative for adenopathy. Does not bruise/bleed easily.  Psychiatric/Behavioral: Negative for confusion, depression and sleep disturbance. The patient is not nervous/anxious.    PHYSICAL EXAMINATION:  There were no vitals taken for this visit.  ECOG PERFORMANCE STATUS: 1  Physical Exam  Constitutional: Oriented to person, place, and time and well-developed, well-nourished, and in no distress.  HENT:  Head: Normocephalic and atraumatic.  Mouth/Throat: Oropharynx is clear and moist. No oropharyngeal exudate.  Eyes: Conjunctivae are normal. Right eye exhibits no discharge. Left eye exhibits no discharge. No scleral icterus.  Neck: Normal range of motion. Neck supple.  Cardiovascular: Normal rate, regular rhythm, normal heart sounds and intact distal pulses.   Pulmonary/Chest: Effort normal and breath sounds normal. No respiratory distress. No wheezes. No rales.  Abdominal: Soft. Bowel sounds are normal. Exhibits no distension and no mass. There is no tenderness.  Musculoskeletal: Normal range of motion. Exhibits no edema.  Lymphadenopathy:    No cervical adenopathy.  Neurological: Alert and oriented to person, place, and time. Exhibits normal muscle tone.  Examined in the wheelchair.  Skin: Skin is warm and dry.  No rash noted. Not diaphoretic. No erythema. No pallor.  Psychiatric: Mood, memory and judgment normal.  Vitals reviewed.  LABORATORY DATA: Lab Results  Component Value Date   WBC 7.2 09/20/2022   HGB 15.2 (H) 09/20/2022   HCT 46.2 (H) 09/20/2022   MCV 92.4 09/20/2022   PLT 215 09/20/2022      Chemistry      Component Value Date/Time   NA 140  09/20/2022 0834   NA 143 03/24/2021 1209   NA 139 02/06/2015 0901   K 3.4 (L) 09/20/2022 0834   K 3.9 02/06/2015 0901   CL 105 09/20/2022 0834   CL 102 01/18/2012 0805   CO2 28 09/20/2022 0834   CO2 28 02/06/2015 0901   BUN 18 09/20/2022 0834   BUN 11 03/24/2021 1209   BUN 10.8 02/06/2015 0901   CREATININE 1.69 (H) 09/20/2022 0834   CREATININE 1.5 (H) 02/06/2015 0901      Component Value Date/Time   CALCIUM 9.4 09/20/2022 0834   CALCIUM 9.6 02/06/2015 0901   ALKPHOS 56 09/20/2022 0834   ALKPHOS 71 02/06/2015 0901   AST 15 09/20/2022 0834   AST 12 02/06/2015 0901   ALT 16 09/20/2022 0834   ALT 16 02/06/2015 0901   BILITOT 0.6 09/20/2022 0834   BILITOT 0.87 02/06/2015 0901       RADIOGRAPHIC STUDIES:  No results found.   ASSESSMENT/PLAN:  This is a very pleasant 57 year old African-American female with a history of non-small cell lung cancer, adenocarcinoma.  She was initially diagnosed in 2009.  She underwent a left trisegmentectomy under the care of Dr. Edwyna Shell.  She had evidence of recurrent disease in recurrence in 2012 and she underwent a course of concurrent chemoradiation.    In early June 2021 the patient was found to have suspicious right lower lobe pulmonary nodules concerning for disease metastasis versus metachronous primary.   She had a PET scan which showed no hypermetabolic activity in either the new right lower lobe pulmonary nodule which is reassuring but low-grade or well differentiated neoplasm cannot be completely excluded.   he patient recently had a restaging CT scan performed.  Dr. Arbutus Ped  personally and independently reviewed the scan and discussed the results with the patient today. The scan showed stable 16 mm right lower lobe nodule, grossly unchanged but overall continues to favor primary bronchogenic carcinoma.  Dr. Arbutus Ped recommends a repeat PET scan.  If this shows metabolic activity, he would recommend referral to radiation oncology for SBRT.  I have placed an order for a PET scan.  We will see her back for follow-up visit in 3 weeks after her PET scan to review the results and discuss the next steps.   The patient was advised to call immediately if she has any concerning symptoms in the interval. The patient voices understanding of current disease status and treatment options and is in agreement with the current care plan. All questions were answered. The patient knows to call the clinic with any problems, questions or concerns. We can certainly see the patient much sooner if necessary  No orders of the defined types were placed in this encounter.     Tijuan Dantes L Eliani Leclere, PA-C 09/24/22  ADDENDUM: Hematology/Oncology Attending: I had a face-to-face encounter with the patient today.  I reviewed her records, lab, scan and recommended her care plan.  This is a very pleasant 57 years old African-American female with recurrent non-small cell lung cancer that was initially diagnosed as a stage Ia adenocarcinoma in November 2009 with recurrent disease in September 2012.  She is status post left upper lobe trisegmentectomy with lymph node dissection under the care of Dr. Edwyna Shell on March 24, 2009.  The patient also had a course of concurrent chemoradiation in 2012.  She has been in observation since that time.  She had repeat CT scan of the chest performed recently.  I personally and independently reviewed the scan and discussed  the result with the patient and her daughter today. Her scan showed a stable disease including a 1.6 cm right lower lobe nodule but still  suspicious for primary bronchogenic carcinoma. I recommended for the patient to have a PET scan for further evaluation of this lesion and to rule out disease recurrence.  If the nodule is hypermetabolic on the scan, we may consider her for repeat biopsy followed by SBRT or just SBRT if she is at high risk for biopsy. We will see the patient back for follow-up visit in 3 weeks for discussion of her PET scan and further recommendation regarding her condition. She was advised to call immediately if she has any other concerning symptoms in the interval. The total time spent in the appointment was 20 minutes. Disclaimer: This note was dictated with voice recognition software. Similar sounding words can inadvertently be transcribed and may be missed upon review. Lajuana Matte, MD

## 2022-09-27 ENCOUNTER — Other Ambulatory Visit: Payer: Self-pay

## 2022-09-27 ENCOUNTER — Inpatient Hospital Stay: Payer: Medicare Other | Admitting: Physician Assistant

## 2022-09-27 VITALS — BP 153/87 | HR 66 | Temp 98.7°F | Resp 13 | Wt 205.8 lb

## 2022-09-27 DIAGNOSIS — Z08 Encounter for follow-up examination after completed treatment for malignant neoplasm: Secondary | ICD-10-CM | POA: Diagnosis not present

## 2022-09-27 DIAGNOSIS — C3492 Malignant neoplasm of unspecified part of left bronchus or lung: Secondary | ICD-10-CM

## 2022-09-27 DIAGNOSIS — C3490 Malignant neoplasm of unspecified part of unspecified bronchus or lung: Secondary | ICD-10-CM | POA: Diagnosis not present

## 2022-09-28 ENCOUNTER — Telehealth: Payer: Self-pay | Admitting: Internal Medicine

## 2022-09-28 NOTE — Telephone Encounter (Signed)
Scheduled per 07/16 los, patient has been called and voicemail was left.

## 2022-10-07 ENCOUNTER — Other Ambulatory Visit: Payer: Self-pay | Admitting: Family Medicine

## 2022-10-07 ENCOUNTER — Ambulatory Visit (INDEPENDENT_AMBULATORY_CARE_PROVIDER_SITE_OTHER): Payer: Medicare Other | Admitting: Family Medicine

## 2022-10-07 VITALS — BP 121/80 | HR 67 | Wt 206.6 lb

## 2022-10-07 DIAGNOSIS — E1165 Type 2 diabetes mellitus with hyperglycemia: Secondary | ICD-10-CM

## 2022-10-07 DIAGNOSIS — N1832 Chronic kidney disease, stage 3b: Secondary | ICD-10-CM

## 2022-10-07 DIAGNOSIS — L309 Dermatitis, unspecified: Secondary | ICD-10-CM

## 2022-10-07 DIAGNOSIS — E785 Hyperlipidemia, unspecified: Secondary | ICD-10-CM

## 2022-10-07 DIAGNOSIS — M792 Neuralgia and neuritis, unspecified: Secondary | ICD-10-CM

## 2022-10-07 DIAGNOSIS — Z1231 Encounter for screening mammogram for malignant neoplasm of breast: Secondary | ICD-10-CM

## 2022-10-07 DIAGNOSIS — I10 Essential (primary) hypertension: Secondary | ICD-10-CM

## 2022-10-07 DIAGNOSIS — F331 Major depressive disorder, recurrent, moderate: Secondary | ICD-10-CM

## 2022-10-07 LAB — POCT GLYCOSYLATED HEMOGLOBIN (HGB A1C): HbA1c, POC (controlled diabetic range): 9 % — AB (ref 0.0–7.0)

## 2022-10-07 MED ORDER — CARVEDILOL 25 MG PO TABS: 25.0000 mg | ORAL_TABLET | Freq: Two times a day (BID) | ORAL | 3 refills | Status: DC

## 2022-10-07 MED ORDER — DULOXETINE HCL 60 MG PO CPEP: 120.0000 mg | ORAL_CAPSULE | Freq: Every day | ORAL | 2 refills | Status: DC

## 2022-10-07 MED ORDER — LOSARTAN POTASSIUM 25 MG PO TABS: 25.0000 mg | ORAL_TABLET | Freq: Every day | ORAL | 3 refills | Status: DC

## 2022-10-07 MED ORDER — TRIAMCINOLONE ACETONIDE 0.05 % EX OINT: 1.0000 "application " | TOPICAL_OINTMENT | Freq: Two times a day (BID) | CUTANEOUS | 0 refills | Status: DC

## 2022-10-07 MED ORDER — LIDOCAINE 5 % EX PTCH: 1.0000 | MEDICATED_PATCH | CUTANEOUS | 0 refills | Status: AC

## 2022-10-07 MED ORDER — LOSARTAN POTASSIUM 25 MG PO TABS: 12.5000 mg | ORAL_TABLET | Freq: Every day | ORAL | 3 refills | Status: AC

## 2022-10-07 MED ORDER — SEMAGLUTIDE(0.25 OR 0.5MG/DOS) 2 MG/1.5ML ~~LOC~~ SOPN: 0.2500 mg | PEN_INJECTOR | SUBCUTANEOUS | 3 refills | Status: DC

## 2022-10-07 NOTE — Progress Notes (Signed)
SUBJECTIVE:   CHIEF COMPLAINT / HPI: Diabetes and hypertension  Patient is being followed by oncology for recurrent non-small cell lung cancer.  She has an upcoming PET scan.  T2DM -Current medication regimen: Lantus 20 units daily, metformin 1000 mg twice daily -Last A1c: 7.6 in January 23.  Today's A1c is 9.0 -Home CBGs: 120 fasting-300 post prandial -Denies polyuria, polydipsia, abdominal pain, chest pain, shortness of breath, symptoms of hypoglycemia On gabapentin 300mg  TID, feels her neuropathic pain is not well controlled.  She is also requesting Lidoderm patch because she sometimes gets pain in her ribs. Tried jardiance but didn't respond well  HTN BP high today, pt and daughter feel this is due to worsening neuropathic pain. Recheck BP was normal Taking amlodipine, Coreg as prescribed  CKD Has not seen [facility] in a while - provided number for her to reestablish  Requesting refill on coreg and triamcinolone for eczema  PERTINENT  PMH / PSH:   Past Medical History:  Diagnosis Date   Anxiety    Arthritis    Blindness, legal    glaucoma and retinitis  pigmentosa   Chronic eczema    Chronic headaches 10-17-11   not migraines- just regular   Constipation    Depression    Diabetes mellitus type II    diet    Family history of anesthesia complication    1 son had malignant hyperthermia at 43yrs  tonsils rained   Family history of malignant hyperthermia    1 son 16 years ago   Fibromyalgia    denies, has not been diagnosited   GERD (gastroesophageal reflux disease)    Glaucoma    History of chemotherapy 01/2011 to 03/2011   concurrent w/radiation therapy   Hx of radiation therapy 01-17-11 to 03-18-11   lung   Hyperlipidemia    Hypertension    Lung cancer (HCC)    lung ca dx 2010   Malignant hyperthermia    As of 12/30/15, no personal MH history, but reported her son has a history of MH ~ 2000 (no confirmatory testing done)   Onychomycosis    Retinitis pigmentosa     Shortness of breath    with exertion   Sleep apnea    cpap 5 yrs    OBJECTIVE:  BP 121/80   Pulse 67   Wt 206 lb 9.6 oz (93.7 kg)   SpO2 100%   BMI 37.79 kg/m   General: NAD, pleasant, able to participate in exam, in wheelchair Cardiac: RRR, no murmurs auscultated Respiratory: CTAB, normal WOB Abdomen: soft, non-tender, non-distended, normoactive bowel sounds Extremities: warm and well perfused, no edema or cyanosis Skin: warm and dry, eczema noted on hands Neuro: alert, no obvious focal deficits, speech normal Psych: Normal affect and mood  ASSESSMENT/PLAN:     Uncontrolled type 2 diabetes mellitus with hyperglycemia (HCC) Assessment & Plan: A1c 9.0 today from 7.6 at last visit.  However, her fasting blood sugars are in the low 100s, hesitant to increase insulin due to this.  Will start Ozempic 0.25 mg for 4 weeks then titrate up as appropriate.  Checking UACR today.  Continue metformin and Lantus.  For neuropathic pain, she is maxed out on gabapentin given her CKD.  Increased Cymbalta to 120 mg.  May consider switch to Lyrica if this does not help her.  Orders: -     POCT glycosylated hemoglobin (Hb A1C) -     Microalbumin / creatinine urine ratio -     Semaglutide(0.25 or  0.5MG /DOS); Inject 0.25 mg into the skin once a week. 0.25 mg once weekly for 4 weeks then increase to 0.5 mg weekly for at least 4 weeks,max 1 mg  Dispense: 3 mL; Refill: 3  Essential hypertension Assessment & Plan: Blood pressure well-controlled but would benefit from taking losartan for kidney protection.  Start at 12.5 mg, follow-up in about 1 week and check BMP.  Provided blood pressure log  Orders: -     Carvedilol; Take 1 tablet (25 mg total) by mouth 2 (two) times daily with a meal.  Dispense: 180 tablet; Refill: 3 -     Losartan Potassium; Take 0.5 tablets (12.5 mg total) by mouth at bedtime.  Dispense: 90 tablet; Refill: 3  Hyperlipidemia, unspecified hyperlipidemia type -     Carvedilol;  Take 1 tablet (25 mg total) by mouth 2 (two) times daily with a meal.  Dispense: 180 tablet; Refill: 3  Chronic eczema -     Triamcinolone Acetonide; Apply 1 application  topically 2 (two) times daily.  Dispense: 17 g; Refill: 0  Neuropathic pain -     Lidocaine; Place 1 patch onto the skin daily. Remove & Discard patch within 12 hours or as directed by MD  Dispense: 30 patch; Refill: 0 -     DULoxetine HCl; Take 2 capsules (120 mg total) by mouth daily. TAKE 1 CAPSULE BY MOUTH EVERY DAY  Dispense: 90 capsule; Refill: 2  Encounter for screening mammogram for malignant neoplasm of breast -     3D Screening Mammogram, Left and Right; Future  Morbid obesity (HCC) -     Semaglutide(0.25 or 0.5MG /DOS); Inject 0.25 mg into the skin once a week. 0.25 mg once weekly for 4 weeks then increase to 0.5 mg weekly for at least 4 weeks,max 1 mg  Dispense: 3 mL; Refill: 3  Moderate episode of recurrent major depressive disorder (HCC) -     DULoxetine HCl; Take 2 capsules (120 mg total) by mouth daily. TAKE 1 CAPSULE BY MOUTH EVERY DAY  Dispense: 90 capsule; Refill: 2  Stage 3b chronic kidney disease (HCC) Assessment & Plan: Provided phone number for her to schedule an appointment with [facility] as she has seen them in the past    Meds ordered this encounter  Medications   carvedilol (COREG) 25 MG tablet    Sig: Take 1 tablet (25 mg total) by mouth 2 (two) times daily with a meal.    Dispense:  180 tablet    Refill:  3   TRIAMCINOLONE ACETONIDE, TOP, 0.05 % OINT    Sig: Apply 1 application  topically 2 (two) times daily.    Dispense:  17 g    Refill:  0   lidocaine (LIDODERM) 5 %    Sig: Place 1 patch onto the skin daily. Remove & Discard patch within 12 hours or as directed by MD    Dispense:  30 patch    Refill:  0   DISCONTD: losartan (COZAAR) 25 MG tablet    Sig: Take 1 tablet (25 mg total) by mouth at bedtime.    Dispense:  90 tablet    Refill:  3   Semaglutide,0.25 or 0.5MG /DOS,  2 MG/1.5ML SOPN    Sig: Inject 0.25 mg into the skin once a week. 0.25 mg once weekly for 4 weeks then increase to 0.5 mg weekly for at least 4 weeks,max 1 mg    Dispense:  3 mL    Refill:  3   DULoxetine (CYMBALTA)  60 MG capsule    Sig: Take 2 capsules (120 mg total) by mouth daily. TAKE 1 CAPSULE BY MOUTH EVERY DAY    Dispense:  90 capsule    Refill:  2   losartan (COZAAR) 25 MG tablet    Sig: Take 0.5 tablets (12.5 mg total) by mouth at bedtime.    Dispense:  90 tablet    Refill:  3   Return in about 10 days (around 10/17/2022).  Recheck BMP at next visit  [name], MD [facility]

## 2022-10-07 NOTE — Assessment & Plan Note (Addendum)
A1c 9.0 today from 7.6 at last visit.  However, her fasting blood sugars are in the low 100s, hesitant to increase insulin due to this.  Will start Ozempic 0.25 mg for 4 weeks then titrate up as appropriate.  Checking UACR today.  Continue metformin and Lantus.  For neuropathic pain, she is maxed out on gabapentin given her CKD.  Increased Cymbalta to 120 mg.  May consider switch to Lyrica if this does not help her.

## 2022-10-07 NOTE — Patient Instructions (Addendum)
Please start taking Ozempic 0.25 mg once a week.  Please continue taking your Lantus and metformin.  Please keep a close eye on your blood sugars.  Please start taking losartan 12.5 mg (half tablet) daily for your blood pressure and kidneys.  We will recheck your blood work at your next visit.  Please keep track of your blood pressure at home.  I have increased your Cymbalta dose to 120 mg daily.  Please continue taking your gabapentin as prescribed.  I refilled your eczema cream and sent in a Lidoderm patch as well.  Please call to get your mammogram scheduled.  Please call [phone] to get an appointment with your kidney doctor   Blood Pressure Record Sheet To take your blood pressure, you will need a blood pressure machine. You can buy a blood pressure machine (blood pressure monitor) at your clinic, drug store, or online. When choosing one, consider: An automatic monitor that has an arm cuff. A cuff that wraps snugly around your upper arm. You should be able to fit only one finger between your arm and the cuff. A device that stores blood pressure reading results. Do not choose a monitor that measures your blood pressure from your wrist or finger. Follow your health care provider's instructions for how to take your blood pressure. To use this form: Take your blood pressure medications every day These measurements should be taken when you have been at rest for at least 10-15 min Take at least 2 readings with each blood pressure check. This makes sure the results are correct. Wait 1-2 minutes between measurements. Write down the results in the spaces on this form. Keep in mind it should always be recorded systolic over diastolic. Both numbers are important.  Repeat this every day for 2-3 weeks, or as told by your health care provider.  Make a follow-up appointment with your health care provider to discuss the results.  Blood Pressure Log Date Medications taken? (Y/N) Blood Pressure  Time of Day

## 2022-10-07 NOTE — Assessment & Plan Note (Addendum)
Blood pressure well-controlled but would benefit from taking losartan for kidney protection.  Start at 12.5 mg, follow-up in about 1 week and check BMP.  Provided blood pressure log

## 2022-10-07 NOTE — Assessment & Plan Note (Signed)
Provided phone number for her to schedule an appointment with [facility] as she has seen them in the past

## 2022-10-10 ENCOUNTER — Other Ambulatory Visit: Payer: Self-pay

## 2022-10-11 ENCOUNTER — Other Ambulatory Visit: Payer: Self-pay | Admitting: Family Medicine

## 2022-10-11 ENCOUNTER — Encounter: Payer: Self-pay | Admitting: Family Medicine

## 2022-10-11 DIAGNOSIS — F331 Major depressive disorder, recurrent, moderate: Secondary | ICD-10-CM

## 2022-10-11 DIAGNOSIS — M792 Neuralgia and neuritis, unspecified: Secondary | ICD-10-CM

## 2022-10-11 MED ORDER — DULOXETINE HCL 60 MG PO CPEP: 120.0000 mg | ORAL_CAPSULE | Freq: Every day | ORAL | 2 refills | Status: DC

## 2022-10-13 ENCOUNTER — Other Ambulatory Visit: Payer: Self-pay

## 2022-10-13 ENCOUNTER — Telehealth: Payer: Self-pay

## 2022-10-13 MED ORDER — GABAPENTIN 300 MG PO CAPS: ORAL_CAPSULE | ORAL | 1 refills | Status: DC

## 2022-10-13 NOTE — Telephone Encounter (Signed)
Patient calls nurse line requesting refill on gabapentin. Medication pended to this encounter.   Veronda Prude, RN

## 2022-10-13 NOTE — Telephone Encounter (Signed)
Pharmacy Patient Advocate Encounter   Received notification from CoverMyMeds that prior authorization for LIDOCAINE PATCHES is required/requested.    PA required; PA submitted to [facility] via CoverMyMeds Key/confirmation #/EOC [id number]. Status is pending

## 2022-10-14 NOTE — Telephone Encounter (Signed)
Pharmacy Patient Advocate Encounter  Received notification from [facility] that Prior Authorization for LIDOCAINE 5% PATCHES has been APPROVED 10/13/22 TO 03/14/23.  PA #/Case ID/Reference #: [id number]

## 2022-10-17 ENCOUNTER — Ambulatory Visit: Payer: Self-pay | Admitting: Family Medicine

## 2022-10-18 ENCOUNTER — Ambulatory Visit: Payer: Medicare Other | Admitting: Family Medicine

## 2022-10-18 NOTE — Progress Notes (Deleted)
    SUBJECTIVE:   CHIEF COMPLAINT / HPI:   Here to follow up on DM medication changes, and check BMP after starting Losartan  PERTINENT  PMH / PSH: ***  OBJECTIVE:   There were no vitals taken for this visit.  ***  ASSESSMENT/PLAN:   No problem-specific Assessment & Plan notes found for this encounter.     Gerrit Heck, DO [facility]

## 2022-10-20 ENCOUNTER — Inpatient Hospital Stay: Payer: Medicare Other

## 2022-10-20 ENCOUNTER — Inpatient Hospital Stay: Payer: Medicare Other | Admitting: Internal Medicine

## 2022-10-21 ENCOUNTER — Encounter (HOSPITAL_COMMUNITY): Admission: RE | Admit: 2022-10-21 | Payer: Medicare Other | Source: Ambulatory Visit

## 2022-10-27 ENCOUNTER — Inpatient Hospital Stay: Payer: Medicare Other | Admitting: Internal Medicine

## 2022-10-27 ENCOUNTER — Inpatient Hospital Stay: Payer: Medicare Other | Attending: Family Medicine

## 2022-10-28 ENCOUNTER — Encounter (HOSPITAL_COMMUNITY)
Admission: RE | Admit: 2022-10-28 | Discharge: 2022-10-28 | Disposition: A | Discharge status: Home / Self Care | Payer: Medicare Other | Source: Ambulatory Visit | Attending: Physician Assistant | Admitting: Physician Assistant

## 2022-10-28 DIAGNOSIS — C3490 Malignant neoplasm of unspecified part of unspecified bronchus or lung: Secondary | ICD-10-CM | POA: Insufficient documentation

## 2022-10-28 LAB — GLUCOSE, CAPILLARY: Glucose-Capillary: 153 mg/dL — ABNORMAL HIGH (ref 70–99)

## 2022-10-28 MED ORDER — FLUDEOXYGLUCOSE F - 18 (FDG) INJECTION
10.2900 | Freq: Once | INTRAVENOUS | Status: AC
  Administered 2022-10-28: 10.29 via INTRAVENOUS

## 2022-10-31 ENCOUNTER — Telehealth: Payer: Self-pay | Admitting: Internal Medicine

## 2022-11-02 ENCOUNTER — Other Ambulatory Visit: Payer: Self-pay

## 2022-11-02 DIAGNOSIS — C3492 Malignant neoplasm of unspecified part of left bronchus or lung: Secondary | ICD-10-CM

## 2022-11-03 ENCOUNTER — Inpatient Hospital Stay: Payer: Medicare Other

## 2022-11-03 ENCOUNTER — Inpatient Hospital Stay: Payer: Medicare Other | Admitting: Internal Medicine

## 2022-11-29 ENCOUNTER — Other Ambulatory Visit: Payer: Self-pay | Admitting: Family Medicine

## 2022-11-29 DIAGNOSIS — I10 Essential (primary) hypertension: Secondary | ICD-10-CM

## 2023-01-12 ENCOUNTER — Ambulatory Visit: Payer: Medicare Other

## 2023-01-12 VITALS — Ht 62.0 in | Wt 206.0 lb

## 2023-01-12 DIAGNOSIS — Z0000 Encounter for general adult medical examination without abnormal findings: Secondary | ICD-10-CM

## 2023-01-12 NOTE — Patient Instructions (Addendum)
Ms. Cheak , Thank you for taking time to come for your Medicare Wellness Visit. I appreciate your ongoing commitment to your health goals. Please review the following plan we discussed and let me know if I can assist you in the future.   Referrals/Orders/Follow-Ups/Clinician Recommendations: No  This is a list of the screening recommended for you and due dates:  Health Maintenance  Topic Date Due   Zoster (Shingles) Vaccine (1 of 2) Never done   Mammogram  Never done   Eye exam for diabetics  05/31/2018   COVID-19 Vaccine (3 - Pfizer risk series) 02/26/2020   Complete foot exam   03/27/2021   Flu Shot  10/13/2022   Colon Cancer Screening  04/18/2023   Hemoglobin A1C  04/09/2023   Yearly kidney function blood test for diabetes  09/20/2023   Yearly kidney health urinalysis for diabetes  10/07/2023   Medicare Annual Wellness Visit  01/12/2024   DTaP/Tdap/Td vaccine (3 - Td or Tdap) 12/02/2026   Hepatitis C Screening  Completed   HIV Screening  Completed   HPV Vaccine  Aged Out    Advanced directives: (Declined) Advance directive discussed with you today. Even though you declined this today, please call our office should you change your mind, and we can give you the proper paperwork for you to fill out.  Next Medicare Annual Wellness Visit scheduled for next year: No

## 2023-01-12 NOTE — Progress Notes (Cosign Needed Addendum)
Subjective:   ANNICE NIBBE is a 57 y.o. female who presents for an Initial Medicare Annual Wellness Visit.  Visit Complete: Virtual I connected with  ZIPORAH DADDIO on 01/12/23 by a audio enabled telemedicine application and verified that I am speaking with the correct person using two identifiers.  Patient Location: Home  Provider Location: Office/Clinic  I discussed the limitations of evaluation and management by telemedicine. The patient expressed understanding and agreed to proceed.  Vital Signs: Because this visit was a virtual/telehealth visit, some criteria may be missing or patient reported. Any vitals not documented were not able to be obtained and vitals that have been documented are patient reported.  Cardiac Risk Factors include: diabetes mellitus;dyslipidemia;family history of premature cardiovascular disease;hypertension;obesity (BMI >30kg/m2);sedentary lifestyle;smoking/ tobacco exposure     Objective:    Today's Vitals   01/12/23 1002 01/12/23 1017  Weight: 206 lb (93.4 kg)   Height: 5\' 2"  (1.575 m)   PainSc: 8  8   PainLoc: Back    Body mass index is 37.68 kg/m.     01/12/2023   10:26 AM 10/07/2022   11:47 AM 04/15/2021    9:31 AM 05/05/2020    1:29 PM 02/27/2020    1:35 PM 11/07/2019    3:52 PM 10/11/2019    2:33 PM  Advanced Directives  Does Patient Have a Medical Advance Directive? No No No No No No No  Would patient like information on creating a medical advance directive? No - Patient declined  No - Patient declined No - Patient declined No - Patient declined No - Patient declined No - Patient declined    Current Medications (verified) Outpatient Encounter Medications as of 01/12/2023  Medication Sig   albuterol (PROAIR HFA) 108 (90 Base) MCG/ACT inhaler Inhale 2 puffs into the lungs every 4 (four) hours as needed for wheezing or shortness of breath. (Patient not taking: Reported on 01/17/2022)   albuterol (PROVENTIL) (2.5 MG/3ML) 0.083% nebulizer  solution Take 6 mLs (5 mg total) by nebulization every 6 (six) hours as needed for wheezing or shortness of breath.   amLODipine (NORVASC) 10 MG tablet TAKE 1 TABLET (10 MG TOTAL) BY MOUTH DAILY. MAKE APPT FOR FUTURE REFILLS.   aspirin EC 81 MG tablet Take 1 tablet (81 mg total) by mouth daily.   atorvastatin (LIPITOR) 80 MG tablet Take 1 tablet (80 mg total) by mouth daily.   baclofen (LIORESAL) 10 MG tablet Oral for 90 Days   budesonide-formoterol (SYMBICORT) 160-4.5 MCG/ACT inhaler Inhale 2 puffs into the lungs in the morning and at bedtime.   carvedilol (COREG) 25 MG tablet Take 1 tablet (25 mg total) by mouth 2 (two) times daily with a meal.   clobetasol cream (TEMOVATE) 0.05 % Apply 1 application topically 2 (two) times daily.   DULoxetine (CYMBALTA) 60 MG capsule Take 2 capsules (120 mg total) by mouth daily. TAKE 2 CAPSULES BY MOUTH EVERY DAY   fluticasone (FLONASE) 50 MCG/ACT nasal spray SPRAY 2 SPRAYS INTO EACH NOSTRIL EVERY DAY   gabapentin (NEURONTIN) 300 MG capsule TAKE 1 CAPSULE BY MOUTH THREE TIMES A DAY   insulin glargine (LANTUS SOLOSTAR) 100 UNIT/ML Solostar Pen INJECT 20 UNITS INTO THE SKIN EVERY MORNING.   Insulin Pen Needle (PEN NEEDLES) 32G X 4 MM MISC 1 Stick by Does not apply route daily.   lidocaine (LIDODERM) 5 % Place 1 patch onto the skin daily. Remove & Discard patch within 12 hours or as directed by MD   linaclotide (  LINZESS) 290 MCG CAPS capsule Take 1 capsule (290 mcg total) by mouth daily. REQUIRES VISIT BEFORE NEXT REFILL   losartan (COZAAR) 25 MG tablet Take 0.5 tablets (12.5 mg total) by mouth at bedtime.   metFORMIN (GLUCOPHAGE) 1000 MG tablet TAKE 1 TABLET (1,000 MG TOTAL) BY MOUTH TWICE A DAY WITH FOOD   montelukast (SINGULAIR) 10 MG tablet Take 1 tablet (10 mg total) by mouth at bedtime.   omeprazole (PRILOSEC) 20 MG capsule TAKE 1 CAPSULE BY MOUTH EVERY DAY   oxymetazoline (AFRIN NASAL SPRAY) 0.05 % nasal spray Place 1 spray into both nostrils 2 (two) times  daily.   Semaglutide,0.25 or 0.5MG /DOS, 2 MG/1.5ML SOPN Inject 0.25 mg into the skin once a week. 0.25 mg once weekly for 4 weeks then increase to 0.5 mg weekly for at least 4 weeks,max 1 mg   Spacer/Aero-Holding Chambers (AEROCHAMBER MV) inhaler Use as instructed   triamcinolone cream (KENALOG) 0.1 % Apply 1 Application topically 2 (two) times daily.   varenicline (CHANTIX CONTINUING MONTH PAK) 1 MG tablet Take by mouth.   Facility-Administered Encounter Medications as of 01/12/2023  Medication   azelastine (ASTELIN) 0.1 % nasal spray 2 spray    Allergies (verified) Penicillins, Ibuprofen, Codeine, Morphine and codeine, and Tomato   History: Past Medical History:  Diagnosis Date   Anxiety    Arthritis    Blindness, legal    glaucoma and retinitis  pigmentosa   Chronic eczema    Chronic headaches 10-17-11   not migraines- just regular   Constipation    Depression    Diabetes mellitus type II    diet    Family history of anesthesia complication    1 son had malignant hyperthermia at 73yrs  tonsils rained   Family history of malignant hyperthermia    1 son 16 years ago   Fibromyalgia    denies, has not been diagnosited   GERD (gastroesophageal reflux disease)    Glaucoma    History of chemotherapy 01/2011 to 03/2011   concurrent w/radiation therapy   Hx of radiation therapy 01-17-11 to 03-18-11   lung   Hyperlipidemia    Hypertension    Lung cancer (HCC)    lung ca dx 2010   Malignant hyperthermia    As of 12/30/15, no personal MH history, but reported her son has a history of MH ~ 2000 (no confirmatory testing done)   Onychomycosis    Retinitis pigmentosa    Shortness of breath    with exertion   Sleep apnea    cpap 5 yrs   Past Surgical History:  Procedure Laterality Date   ABDOMINAL HYSTERECTOMY  09/19/2001   hx. of fibroids. TAH. Ovaries remain.    BACK SURGERY  06/2013   CHEST TUBE INSERTION   04/01/08   L hydropneumothorax   EYE SURGERY Bilateral    lens implant    KNEE ARTHROSCOPY  10-17-11   bil. knee scope(left was torn ligament)   LUMBAR LAMINECTOMY/DECOMPRESSION MICRODISCECTOMY  10/27/2011   Procedure: LUMBAR LAMINECTOMY/DECOMPRESSION MICRODISCECTOMY;  Surgeon: Javier Docker, MD;  Location: [facility];  Service: Orthopedics;  Laterality: N/A;  L4-L5   lung surgery  03/24/08   L vats, L thoracotomy and LUL trisegmentectomy with node dissection   MAXIMUM ACCESS (MAS)POSTERIOR LUMBAR INTERBODY FUSION (PLIF) 1 LEVEL N/A 07/03/2013   Procedure: FOR MAXIMUM ACCESS (MAS) POSTERIOR LUMBAR INTERBODY FUSION (PLIF) 1 LEVEL;  Surgeon: Tia Alert, MD;  Location: [facility];  Service: Neurosurgery;  Laterality: N/A;  FOR MAXIMUM ACCESS (MAS) POSTERIOR LUMBAR INTERBODY FUSION (PLIF) 1 LEVEL (L4-L5)   PORT-A-CATH REMOVAL Right 11/26/2013   Procedure: REMOVAL PORT-A-CATH;  Surgeon: Alleen Borne, MD;  Location: [facility] OR;  Service: Thoracic;  Laterality: Right;   PORTACATH PLACEMENT  02/11/2011   Procedure: INSERTION PORT-A-CATH;  Surgeon: Norton Blizzard, MD;  Location: [facility] OR;  Service: Thoracic;  Laterality: Right;  9.6Fr. Pre-attached Power Port in Right Internal Jugular  -right chest-remains inplace 10-17-11   SPINAL CORD STIMULATOR INSERTION N/A 02/03/2016   Procedure: LUMBAR SPINAL CORD STIMULATOR INSERTION;  Surgeon: Tia Alert, MD;  Location: [facility] OR;  Service: Neurosurgery;  Laterality: N/A;   Family History  Problem Relation Age of Onset   Brain cancer Maternal Aunt    Depression Mother    Heart disease Mother    Liver cancer Mother    Depression Son    Diabetes Maternal Grandmother    Stroke Maternal Grandmother    Depression Maternal Grandmother    Depression Daughter    Malignant hyperthermia Son 16       tonsils drained   Colon cancer Neg Hx    Rectal cancer Neg Hx    Stomach cancer Neg Hx    Social History   Socioeconomic History   Marital status: Married    Spouse name: Not on file   Number of children: 3   Years of education: Not on file    Highest education level: Not on file  Occupational History   Not on file  Tobacco Use   Smoking status: Some Days    Current packs/day: 0.18    Average packs/day: 0.2 packs/day for 44.8 years (8.1 ttl pk-yrs)    Types: Cigarettes    Start date: 03/14/1978   Smokeless tobacco: Never   Tobacco comments:    Taking chantix and smoking 1/2 PPD (has decrease to 2 cigs per day) 01/17/2022 Tay  Vaping Use   Vaping status: Never Used  Substance and Sexual Activity   Alcohol use: No    Alcohol/week: 0.0 standard drinks of alcohol   Drug use: No   Sexual activity: Yes    Comment: 11/25/13 - smoking 1/2 of 1.  Other Topics Concern   Not on file  Social History Narrative   Not on file   Social Determinants of Health   Financial Resource Strain: Low Risk  (01/12/2023)   Overall Financial Resource Strain (CARDIA)    Difficulty of Paying Living Expenses: Not hard at all  Food Insecurity: No Food Insecurity (01/12/2023)   Hunger Vital Sign    Worried About Running Out of Food in the Last Year: Never true    Ran Out of Food in the Last Year: Never true  Transportation Needs: Unmet Transportation Needs (01/12/2023)   PRAPARE - Administrator, Civil Service (Medical): Yes    Lack of Transportation (Non-Medical): Yes  Physical Activity: Inactive (01/12/2023)   Exercise Vital Sign    Days of Exercise per Week: 0 days    Minutes of Exercise per Session: 0 min  Stress: Stress Concern Present (01/12/2023)   [employer] of Occupational Health - Occupational Stress Questionnaire    Feeling of Stress : Very much  Social Connections: Unknown (01/12/2023)   Social Connection and Isolation Panel [NHANES]    Frequency of Communication with Friends and Family: More than three times a week    Frequency of Social Gatherings with Friends and Family: More than three times a week  Attends Religious Services: Not on file    Active Member of Clubs or Organizations: No    Attends Tax inspector Meetings: Not on file    Marital Status: Married    Tobacco Counseling Ready to quit: Not Answered Counseling given: Not Answered Tobacco comments: Taking chantix and smoking 1/2 PPD (has decrease to 2 cigs per day) 01/17/2022 Tay   Clinical Intake:  Pre-visit preparation completed: Yes  Pain : 0-10 Pain Score: 8  Pain Type: Chronic pain Pain Location: Back Pain Orientation: Lower Pain Radiating Towards: BILATERAL LOWER EXTREMITIES (HIPS) Pain Onset: More than a month ago Pain Frequency: Constant Pain Relieving Factors: PAIN MEDICATION Effect of Pain on Daily Activities: Pain can diminish job performance, lower motivation to exercise and prevent you from completing daily tasks.  Pain produces disability and affects the quality of life.  Pain Relieving Factors: PAIN MEDICATION  BMI - recorded: 37.68 Nutritional Status: BMI > 30  Obese Nutritional Risks: None Diabetes: Yes CBG done?: No Did pt. bring in CBG monitor from home?: No  How often do you need to have someone help you when you read instructions, pamphlets, or other written materials from your doctor or pharmacy?: 1 - Never What is the last grade level you completed in school?: HSG  Interpreter Needed?: No  Information entered by :: Susie Cassette, LPN.   Activities of Daily Living    01/12/2023   10:28 AM  In your present state of health, do you have any difficulty performing the following activities:  Hearing? 0  Vision? 1  Difficulty concentrating or making decisions? 1  Walking or climbing stairs? 1  Dressing or bathing? 0  Doing errands, shopping? 1  Comment transportation issues  Preparing Food and eating ? Y  Using the Toilet? N  In the past six months, have you accidently leaked urine? Y  Do you have problems with loss of bowel control? Y  Managing your Medications? N  Managing your Finances? N  Housekeeping or managing your Housekeeping? Y    Patient Care Team: [name], MD as PCP - General (Family Medicine) Dessa Phi, MD as Referring Physician (Family Medicine)  Indicate any recent Medical Services you may have received from other than [facility] providers in the past year (date may be approximate).     Assessment:   This is a routine wellness examination for Eily.  Hearing/Vision screen Hearing Screening - Comments:: Patient denied any hearing difficulty.   No hearing aids.  Vision Screening - Comments:: Patient does wear corrective lenses/contacts.  Patient is partially blind. History of Glaucoma. Annual eye exam done by: [facility]    Goals Addressed             This Visit's Progress    Client understands the importance of follow-up with providers by attending scheduled visits        Depression Screen    01/12/2023   10:22 AM 10/07/2022   12:31 PM 04/15/2021    9:37 AM 03/24/2021   11:34 AM 12/15/2020    2:26 PM 05/05/2020    1:30 PM 03/27/2020    1:40 PM  PHQ 2/9 Scores  PHQ - 2 Score 6 5 6 4 2 3 3   PHQ- 9 Score 15 19 16 14 12 11 16     Fall Risk    01/12/2023   10:27 AM 02/27/2020    1:34 PM 08/14/2019    9:56 AM 03/04/2019    9:39 AM 06/05/2017   10:27  AM  Fall Risk   Falls in the past year? 1 0 0 0 No  Number falls in past yr: 1 0 0    Injury with Fall? 1 0 0    Risk for fall due to : History of fall(s);Impaired balance/gait;Impaired vision;Impaired mobility      Follow up Education provided;Falls prevention discussed;Falls evaluation completed        MEDICARE RISK AT HOME: Medicare Risk at Home Any stairs in or around the home?: No If so, are there any without handrails?: No Home free of loose throw rugs in walkways, pet beds, electrical cords, etc?: Yes Adequate lighting in your home to reduce risk of falls?: Yes Life alert?: No Use of a cane, walker or w/c?: Yes Grab bars in the bathroom?: No Shower chair or bench in shower?: Yes Elevated toilet seat or a handicapped toilet?: No  TIMED UP AND  GO:  Was the test performed? No    Cognitive Function:    01/12/2023   10:29 AM  MMSE - Mini Mental State Exam  Not completed: Unable to complete        01/12/2023   10:29 AM  6CIT Screen  What Year? 0 points  What month? 0 points  What time? 0 points  Count back from 20 0 points  Months in reverse 0 points  Repeat phrase 0 points  Total Score 0 points    Immunizations Immunization History  Administered Date(s) Administered   H1N1 02/20/2008   Influenza,inj,Quad PF,6+ Mos 02/13/2017   PFIZER(Purple Top)SARS-COV-2 Vaccination 11/07/2019, 01/29/2020   Pneumococcal Polysaccharide-23 02/13/2017   Td 04/14/2002   Tdap 12/01/2016    TDAP status: Up to date  Flu Vaccine status: Due, Education has been provided regarding the importance of this vaccine. Advised may receive this vaccine at local pharmacy or Health Dept. Aware to provide a copy of the vaccination record if obtained from local pharmacy or Health Dept. Verbalized acceptance and understanding.  Pneumococcal vaccine status: Up to date  Covid-19 vaccine status: Completed vaccines  Qualifies for Shingles Vaccine? Yes   Zostavax completed No   Shingrix Completed?: No.    Education has been provided regarding the importance of this vaccine. Patient has been advised to call insurance company to determine out of pocket expense if they have not yet received this vaccine. Advised may also receive vaccine at local pharmacy or Health Dept. Verbalized acceptance and understanding.  Screening Tests Health Maintenance  Topic Date Due   Zoster Vaccines- Shingrix (1 of 2) Never done   MAMMOGRAM  Never done   OPHTHALMOLOGY EXAM  05/31/2018   COVID-19 Vaccine (3 - Pfizer risk series) 02/26/2020   FOOT EXAM  03/27/2021   INFLUENZA VACCINE  10/13/2022   Colonoscopy  04/18/2023   HEMOGLOBIN A1C  04/09/2023   Diabetic kidney evaluation - eGFR measurement  09/20/2023   Diabetic kidney evaluation - Urine ACR  10/07/2023    Medicare Annual Wellness (AWV)  01/12/2024   DTaP/Tdap/Td (3 - Td or Tdap) 12/02/2026   Hepatitis C Screening  Completed   HIV Screening  Completed   HPV VACCINES  Aged Out    Health Maintenance  Health Maintenance Due  Topic Date Due   Zoster Vaccines- Shingrix (1 of 2) Never done   MAMMOGRAM  Never done   OPHTHALMOLOGY EXAM  05/31/2018   COVID-19 Vaccine (3 - Pfizer risk series) 02/26/2020   FOOT EXAM  03/27/2021   INFLUENZA VACCINE  10/13/2022   Colonoscopy  04/18/2023  Colorectal cancer screening: Type of screening: Colonoscopy. Completed 04/17/2018. Repeat every 5 years  Mammogram status: Ordered 09/17/2022. Pt provided with contact info and advised to call to schedule appt. Patient could not schedule due to lack of transportation.  Bone Density status: Never done.  Lung Cancer Screening: (Low Dose CT Chest recommended if Age 45-80 years, 20 pack-year currently smoking OR have quit w/in 15years.) does not qualify.   Lung Cancer Screening Referral: no  Additional Screening:  Hepatitis C Screening: does qualify; Completed 05/22/2007  Vision Screening: Recommended annual ophthalmology exams for early detection of glaucoma and other disorders of the eye. Is the patient up to date with their annual eye exam?  Yes  Who is the provider or what is the name of the office in which the patient attends annual eye exams? [facility] If pt is not established with a provider, would they like to be referred to a provider to establish care? No .   Dental Screening: Recommended annual dental exams for proper oral hygiene  Diabetic Foot Exam: Diabetic Foot Exam: Completed 03/27/2020; patient is overdue.  Community Resource Referral / Chronic Care Management: CRR required this visit?  No   CCM required this visit?  PCP informed of CCM need     Plan:     I have personally reviewed and noted the following in the patient's chart:   Medical and social history Use of alcohol,  tobacco or illicit drugs  Current medications and supplements including opioid prescriptions. Patient is not currently taking opioid prescriptions. Functional ability and status Nutritional status Physical activity Advanced directives List of other physicians Hospitalizations, surgeries, and ER visits in previous 12 months Vitals Screenings to include cognitive, depression, and falls Referrals and appointments  In addition, I have reviewed and discussed with patient certain preventive protocols, quality metrics, and best practice recommendations. A written personalized care plan for preventive services as well as general preventive health recommendations were provided to patient.     Mickeal Needy, LPN   [id number]   After Visit Summary: (Declined) Due to this being a telephonic visit, with patients personalized plan was offered to patient but patient Declined AVS at this time   Nurse Notes: See Routing Comments.

## 2023-03-12 ENCOUNTER — Other Ambulatory Visit: Payer: Self-pay | Admitting: Family Medicine

## 2023-03-12 DIAGNOSIS — E1165 Type 2 diabetes mellitus with hyperglycemia: Secondary | ICD-10-CM

## 2023-03-16 ENCOUNTER — Other Ambulatory Visit: Payer: Self-pay | Admitting: Family Medicine

## 2023-03-16 DIAGNOSIS — I10 Essential (primary) hypertension: Secondary | ICD-10-CM

## 2023-03-28 NOTE — Progress Notes (Signed)
    SUBJECTIVE:   CHIEF COMPLAINT / HPI:   Cough and congestion, wheezing -Caught a cold 2 weeks ago -Has not improved since then -Ears are hurting now -Lower appetite but has been staying hydrated -cough productive of white sputum -Endorses some wheezing and has been using albuterol  about 3 times per week -Using symbicort  2 puffs BID  T2DM -Current medication regimen: Ozempic   weekly (patient unsure whether she is taking 0.25/0.5), metformin  1000 mg twice daily, Lantus  20 units daily.  Gabapentin , Cymbalta  for neuropathic pain -Home CBGs: 95-126 -Denies polyuria, polydipsia, abdominal pain, chest pain, shortness of breath -Foot exam: done today - no concerns  Lab Results  Component Value Date   HGBA1C 8.3 (A) 03/29/2023   HGBA1C 9.0 (A) 10/07/2022   HGBA1C 7.6 (A) 03/24/2021    HTN Current medications include Norvasc  10 mg daily, Coreg  25 mg twice daily, losartan  12.5 mg daily  PERTINENT  PMH / PSH: COPD, diabetes, hypertension  OBJECTIVE:   BP 131/76   Pulse 69   Ht 5\' 2"  (1.575 m)   Wt 205 lb 6 oz (93.2 kg)   SpO2 100%   BMI 37.56 kg/m   General: NAD, pleasant, in wheelchair, visually impaired HEENT: TTP frontal and maxillary sinuses b/l. B/l TM without bulging or erythema, b/l ear canals slightly erythematous but otherwise normal Cardiac: RRR, no murmurs auscultated Respiratory: end expiratory wheezes bilaterally (R>L), otherwise good air movement b/l Abdomen: soft, non-tender, non-distended, normoactive bowel sounds Extremities: warm and well perfused, no edema or cyanosis. B/l feet with normal sensation to sharp/dull, normal proprioception, no wounds or ulcers Skin: warm and dry, no rashes noted Neuro: alert, no obvious focal deficits, speech normal Psych: Normal affect and mood  ASSESSMENT/PLAN:   Assessment & Plan Acute non-recurrent sinusitis, unspecified location 2wks symptoms with likely COPD exacerbation as well. Sinus tenderness on exam. Allergy to  penicillins. Rx Doxy 100mg  BID x 7 days, f/u 2 weeks.  COPD exacerbation (HCC) Likely 2/2 recent viral illness +/- sinusitis as above. Normal O2 sats. End expiratory wheezing on exam but appears well. Avoid prednisone  for now given her diabetes. Advised continued symbicort  with albuterol  q4h for the next 3-4 days. Doxy as above to also cover for sinusitis. F/u 2 weeks, discussed return precautions Type 2 diabetes mellitus without complication, unspecified whether long term insulin  use (HCC) Advised to inform me about current dose of Ozempic .  Will likely increase given good response at this.  A1c improved to 8.3 today from 9 5 months ago.  Continue Lantus  given good CBGs.  Continue metformin .  BMP next visit.  Needs eye exam.  Foot exam done today Essential hypertension Well-controlled, continue amlodipine  and losartan  and Coreg .  Sent refills  Sent refills of multiple medications per patient request  Edison Gore, MD [facility]

## 2023-03-29 ENCOUNTER — Encounter: Payer: Self-pay | Admitting: Family Medicine

## 2023-03-29 ENCOUNTER — Ambulatory Visit: Payer: Medicare Other | Admitting: Family Medicine

## 2023-03-29 ENCOUNTER — Other Ambulatory Visit: Payer: Self-pay | Admitting: Family Medicine

## 2023-03-29 VITALS — BP 131/76 | HR 69 | Ht 62.0 in | Wt 205.4 lb

## 2023-03-29 DIAGNOSIS — J441 Chronic obstructive pulmonary disease with (acute) exacerbation: Secondary | ICD-10-CM | POA: Diagnosis not present

## 2023-03-29 DIAGNOSIS — J019 Acute sinusitis, unspecified: Secondary | ICD-10-CM | POA: Diagnosis not present

## 2023-03-29 DIAGNOSIS — F331 Major depressive disorder, recurrent, moderate: Secondary | ICD-10-CM

## 2023-03-29 DIAGNOSIS — I10 Essential (primary) hypertension: Secondary | ICD-10-CM | POA: Diagnosis not present

## 2023-03-29 DIAGNOSIS — L309 Dermatitis, unspecified: Secondary | ICD-10-CM

## 2023-03-29 DIAGNOSIS — E119 Type 2 diabetes mellitus without complications: Secondary | ICD-10-CM

## 2023-03-29 DIAGNOSIS — E785 Hyperlipidemia, unspecified: Secondary | ICD-10-CM

## 2023-03-29 DIAGNOSIS — M62838 Other muscle spasm: Secondary | ICD-10-CM

## 2023-03-29 DIAGNOSIS — E1165 Type 2 diabetes mellitus with hyperglycemia: Secondary | ICD-10-CM

## 2023-03-29 DIAGNOSIS — M792 Neuralgia and neuritis, unspecified: Secondary | ICD-10-CM

## 2023-03-29 DIAGNOSIS — K219 Gastro-esophageal reflux disease without esophagitis: Secondary | ICD-10-CM

## 2023-03-29 LAB — POCT GLYCOSYLATED HEMOGLOBIN (HGB A1C): HbA1c, POC (controlled diabetic range): 8.3 % — AB (ref 0.0–7.0)

## 2023-03-29 MED ORDER — DOXYCYCLINE HYCLATE 100 MG PO TABS: 100.0000 mg | ORAL_TABLET | Freq: Two times a day (BID) | ORAL | 0 refills | Status: AC

## 2023-03-29 MED ORDER — AMLODIPINE BESYLATE 10 MG PO TABS: 10.0000 mg | ORAL_TABLET | Freq: Every day | ORAL | 0 refills | Status: DC

## 2023-03-29 MED ORDER — CARVEDILOL 25 MG PO TABS: 25.0000 mg | ORAL_TABLET | Freq: Two times a day (BID) | ORAL | 3 refills | Status: AC

## 2023-03-29 MED ORDER — BUDESONIDE-FORMOTEROL FUMARATE 160-4.5 MCG/ACT IN AERO: 2.0000 | INHALATION_SPRAY | Freq: Two times a day (BID) | RESPIRATORY_TRACT | 6 refills | Status: DC

## 2023-03-29 MED ORDER — OMEPRAZOLE 20 MG PO CPDR: 20.0000 mg | DELAYED_RELEASE_CAPSULE | Freq: Every day | ORAL | 2 refills | Status: AC

## 2023-03-29 MED ORDER — ALBUTEROL SULFATE HFA 108 (90 BASE) MCG/ACT IN AERS: 2.0000 | INHALATION_SPRAY | RESPIRATORY_TRACT | 3 refills | Status: DC | PRN

## 2023-03-29 MED ORDER — GABAPENTIN 300 MG PO CAPS: ORAL_CAPSULE | ORAL | 1 refills | Status: DC

## 2023-03-29 MED ORDER — DULOXETINE HCL 60 MG PO CPEP: 120.0000 mg | ORAL_CAPSULE | Freq: Every day | ORAL | 2 refills | Status: DC

## 2023-03-29 MED ORDER — BACLOFEN 10 MG PO TABS: 5.0000 mg | ORAL_TABLET | Freq: Three times a day (TID) | ORAL | 0 refills | Status: AC | PRN

## 2023-03-29 MED ORDER — ATORVASTATIN CALCIUM 80 MG PO TABS: 80.0000 mg | ORAL_TABLET | Freq: Every day | ORAL | 3 refills | Status: AC

## 2023-03-29 MED ORDER — CLOBETASOL PROPIONATE 0.05 % EX CREA: 1.0000 | TOPICAL_CREAM | Freq: Two times a day (BID) | CUTANEOUS | 1 refills | Status: AC

## 2023-03-29 MED ORDER — METFORMIN HCL 1000 MG PO TABS: 1000.0000 mg | ORAL_TABLET | Freq: Two times a day (BID) | ORAL | 0 refills | Status: DC

## 2023-03-29 NOTE — Assessment & Plan Note (Signed)
 Well-controlled, continue amlodipine  and losartan  and Coreg .  Sent refills

## 2023-03-29 NOTE — Patient Instructions (Addendum)
 Please use albuterol  every 4 hours for the next 3 days. Continue Symbicort   Take doxycycline  twice daily for 7 days  Let me know how much ozempic  you use. Continue metformin  and insulin   Sent refills of multiple medications

## 2023-03-29 NOTE — Assessment & Plan Note (Signed)
 Likely 2/2 recent viral illness +/- sinusitis as above. Normal O2 sats. End expiratory wheezing on exam but appears well. Avoid prednisone  for now given her diabetes. Advised continued symbicort  with albuterol  q4h for the next 3-4 days. Doxy as above to also cover for sinusitis. F/u 2 weeks, discussed return precautions

## 2023-03-29 NOTE — Assessment & Plan Note (Signed)
 2wks symptoms with likely COPD exacerbation as well. Sinus tenderness on exam. Allergy to penicillins. Rx Doxy 100mg  BID x 7 days, f/u 2 weeks.

## 2023-03-29 NOTE — Assessment & Plan Note (Addendum)
 Advised to inform me about current dose of Ozempic .  Will likely increase given good response at this.  A1c improved to 8.3 today from 9 5 months ago.  Continue Lantus  given good CBGs.  Continue metformin .  BMP next visit.  Needs eye exam.  Foot exam done today

## 2023-04-06 ENCOUNTER — Ambulatory Visit: Payer: Medicare Other | Admitting: Family Medicine

## 2023-04-09 ENCOUNTER — Other Ambulatory Visit: Payer: Self-pay | Admitting: Family Medicine

## 2023-04-09 DIAGNOSIS — M792 Neuralgia and neuritis, unspecified: Secondary | ICD-10-CM

## 2023-04-09 DIAGNOSIS — F331 Major depressive disorder, recurrent, moderate: Secondary | ICD-10-CM

## 2023-04-20 ENCOUNTER — Other Ambulatory Visit: Payer: Self-pay | Admitting: Family Medicine

## 2023-04-20 DIAGNOSIS — I10 Essential (primary) hypertension: Secondary | ICD-10-CM

## 2023-05-28 ENCOUNTER — Other Ambulatory Visit: Payer: Self-pay | Admitting: Family Medicine

## 2023-05-28 DIAGNOSIS — E1165 Type 2 diabetes mellitus with hyperglycemia: Secondary | ICD-10-CM

## 2023-05-31 ENCOUNTER — Other Ambulatory Visit: Payer: Self-pay | Admitting: Family Medicine

## 2023-05-31 DIAGNOSIS — M792 Neuralgia and neuritis, unspecified: Secondary | ICD-10-CM

## 2023-05-31 DIAGNOSIS — F331 Major depressive disorder, recurrent, moderate: Secondary | ICD-10-CM

## 2023-06-14 ENCOUNTER — Encounter: Payer: Self-pay | Admitting: Internal Medicine

## 2023-07-07 ENCOUNTER — Other Ambulatory Visit: Payer: Self-pay | Admitting: Family Medicine

## 2023-07-07 DIAGNOSIS — J441 Chronic obstructive pulmonary disease with (acute) exacerbation: Secondary | ICD-10-CM

## 2023-08-16 ENCOUNTER — Other Ambulatory Visit: Payer: Self-pay

## 2023-08-16 DIAGNOSIS — E1165 Type 2 diabetes mellitus with hyperglycemia: Secondary | ICD-10-CM

## 2023-08-16 MED ORDER — PEN NEEDLES 32G X 4 MM MISC
1.0000 | Freq: Every day | 3 refills | Status: AC
Start: 2023-08-16 — End: ?

## 2023-09-11 ENCOUNTER — Other Ambulatory Visit: Payer: Self-pay | Admitting: Family Medicine

## 2023-09-11 ENCOUNTER — Other Ambulatory Visit: Payer: Self-pay

## 2023-09-11 DIAGNOSIS — E785 Hyperlipidemia, unspecified: Secondary | ICD-10-CM

## 2023-09-11 DIAGNOSIS — K581 Irritable bowel syndrome with constipation: Secondary | ICD-10-CM

## 2023-09-11 DIAGNOSIS — E1165 Type 2 diabetes mellitus with hyperglycemia: Secondary | ICD-10-CM

## 2023-09-11 DIAGNOSIS — I10 Essential (primary) hypertension: Secondary | ICD-10-CM

## 2023-09-11 MED ORDER — AMLODIPINE BESYLATE 10 MG PO TABS: 10.0000 mg | ORAL_TABLET | Freq: Every day | ORAL | 1 refills | Status: AC

## 2023-09-11 MED ORDER — METFORMIN HCL 1000 MG PO TABS: 1000.0000 mg | ORAL_TABLET | Freq: Two times a day (BID) | ORAL | 0 refills | Status: AC

## 2023-09-11 MED ORDER — LINACLOTIDE 290 MCG PO CAPS
290.0000 ug | ORAL_CAPSULE | Freq: Every day | ORAL | 0 refills | Status: AC
Start: 2023-09-11 — End: ?

## 2023-10-31 ENCOUNTER — Telehealth: Payer: Self-pay

## 2023-10-31 NOTE — Telephone Encounter (Signed)
 Spoke with patient's daughter regarding follow-up appointment with Dr. Sherrod. She also inquired about her mother's upcoming hip surgery. Informed her that an appointment has been scheduled for 11/14/23 at 3:45 PM. Daughter voiced understanding and had no further questions at this time.

## 2023-11-08 NOTE — Progress Notes (Signed)
 11/09/23- 58 yoF Smoker(1ppd) for preop evaluation, legally blind Son had Malignant Hyperthermia  Medical problems include- Hx LungCa, OSA,  Depression, DM2, IBS, HTN, COPD, CKD 3, Last saw Dr Alaine in 2023- Synopsis: Referred in September 2023 for abnormal CT scan of the chest.  She has a history of COPD as well as adenocarcinoma of the left upper lobe.  Initially diagnosed as stage Ia adenocarcinoma in November 2009, had evidence of recurrent disease in September 2012.  Underwent left upper lobe trisegmentectomy with lymph node dissection in 2011, concurrent chemoradiation with carboplatin  and paclitaxel  in 2012. Last saw Oncology August, 2024 Dx OSA 2014-? Using CPAP?                                    Son here today Spirometry 2023- moderate restriction of exhaled volume Says breathing is fine- denies cough, wheeze, recent changes. Says hip pain limits activity more than breathing does. Uses albuterol  rescue inhaler 1x/week, rarely usees nebulizer. Uses CPAP routinely every night. No download available. We think DME is [employer]- checking for data.  PET-10/28/22- IMPRESSION: 1. Relatively low metabolic activity associated with the RIGHT lobe pulmonary nodule of concern. Findings not typical for bronchogenic carcinoma. Cannot exclude low-grade adenocarcinoma. Recommend continued surveillance versus tissue sampling. 2. Stable post surgical and radiation change with consolidation in the LEFT upper lobe. No evidence of local recurrence in LEFT lung. 3. No evidence of metastatic adenopathy or distant metastatic dis  Prior to Admission medications   Medication Sig Start Date End Date Taking? Authorizing Provider  albuterol  (PROVENTIL ) (2.5 MG/3ML) 0.083% nebulizer solution Take 6 mLs (5 mg total) by nebulization every 6 (six) hours as needed for wheezing or shortness of breath. 02/26/18  Yes Rumball, Alison M, DO  amLODipine  (NORVASC ) 10 MG tablet Take 1 tablet (10 mg total) by mouth daily. Make  appt for future refills. 09/11/23  Yes Romelle Booty, MD  aspirin  EC 81 MG tablet Take 1 tablet (81 mg total) by mouth daily. 01/08/16  Yes Phelps, Jazma Y, DO  atorvastatin  (LIPITOR) 80 MG tablet Take 1 tablet (80 mg total) by mouth daily. 03/29/23  Yes Romelle Booty, MD  carvedilol  (COREG ) 25 MG tablet Take 1 tablet (25 mg total) by mouth 2 (two) times daily with a meal. 03/29/23  Yes Romelle Booty, MD  clobetasol  cream (TEMOVATE ) 0.05 % Apply 1 Application topically 2 (two) times daily. 03/29/23  Yes Romelle Booty, MD  DULoxetine  (CYMBALTA ) 60 MG capsule TAKE 2 CAPSULES (120 MG TOTAL) BY MOUTH DAILY. 05/31/23  Yes Romelle Booty, MD  fluticasone  (FLONASE ) 50 MCG/ACT nasal spray SPRAY 2 SPRAYS INTO EACH NOSTRIL EVERY DAY 04/12/19  Yes Rumball, Donald HERO, DO  gabapentin  (NEURONTIN ) 300 MG capsule TAKE 1 CAPSULE BY MOUTH THREE TIMES A DAY 03/29/23  Yes Romelle Booty, MD  insulin  glargine (LANTUS  SOLOSTAR) 100 UNIT/ML Solostar Pen INJECT 20 UNITS INTO THE SKIN EVERY MORNING 09/11/23  Yes Romelle Booty, MD  Insulin  Pen Needle (PEN NEEDLES) 32G X 4 MM MISC 1 Stick by Does not apply route daily. 08/16/23  Yes Mahmood, [name], MD  ipratropium (ATROVENT ) 0.03 % nasal spray Place 2 sprays into both nostrils 3 (three) times daily. 02/02/23 02/02/24 Yes [provider]  lidocaine  (LIDODERM ) 5 % Place 1 patch onto the skin daily. Remove & Discard patch within 12 hours or as directed by MD 10/07/22  Yes Romelle Booty, MD  linaclotide  (LINZESS ) 290 MCG  CAPS capsule Take 1 capsule (290 mcg total) by mouth daily. REQUIRES VISIT BEFORE NEXT REFILL 09/11/23  Yes Romelle Booty, MD  metFORMIN  (GLUCOPHAGE ) 1000 MG tablet Take 1 tablet (1,000 mg total) by mouth 2 (two) times daily with a meal. NEEDS APPOINTMENT PRIOR TO NEXT REFILL 09/11/23  Yes Romelle Booty, MD  montelukast  (SINGULAIR ) 10 MG tablet Take 1 tablet (10 mg total) by mouth at bedtime. 08/14/19  Yes Rumball, Alison M, DO  omeprazole  (PRILOSEC) 20 MG capsule Take 1  capsule (20 mg total) by mouth daily. 03/29/23  Yes Romelle Booty, MD  Semaglutide ,0.25 or 0.5MG /DOS, (OZEMPIC , 0.25 OR 0.5 MG/DOSE,) 2 MG/3ML SOPN Inject 0.5 mg as directed once a week. 05/29/23  Yes Romelle Booty, MD  Spacer/Aero-Holding Chambers (AEROCHAMBER MV) inhaler Use as instructed 11/17/21  Yes McQuaid, Douglas B, MD  triamcinolone  cream (KENALOG ) 0.1 % Apply 1 Application topically 2 (two) times daily. 10/11/22  Yes Romelle Booty, MD  albuterol  (VENTOLIN  HFA) 108 (90 Base) MCG/ACT inhaler Inhale 2 puffs into the lungs every 4 (four) hours as needed for wheezing or shortness of breath. 11/09/23   Neysa Reggy BIRCH, MD  baclofen  (LIORESAL ) 10 MG tablet Take 0.5 tablets (5 mg total) by mouth 3 (three) times daily as needed for muscle spasms. Patient not taking: Reported on 11/09/2023 03/29/23   Romelle Booty, MD  fluticasone -salmeterol (WIXELA INHUB) 250-50 MCG/ACT AEPB Inhale 1 puff into the lungs in the morning and at bedtime. 11/09/23   Neysa Reggy D, MD  losartan  (COZAAR ) 25 MG tablet Take 0.5 tablets (12.5 mg total) by mouth at bedtime. Patient not taking: Reported on 11/09/2023 10/07/22   Romelle Booty, MD  varenicline  (CHANTIX  CONTINUING MONTH PAK) 1 MG tablet Take by mouth. Patient not taking: Reported on 11/09/2023    [provider]    Past Medical History:  Diagnosis Date   Anxiety    Arthritis    Blindness, legal    glaucoma and retinitis  pigmentosa   Chronic eczema    Chronic headaches 10-17-11   not migraines- just regular   Constipation    Depression    Diabetes mellitus type II    diet    Family history of anesthesia complication    1 son had malignant hyperthermia at 71yrs  tonsils rained   Family history of malignant hyperthermia    1 son 16 years ago   Fibromyalgia    denies, has not been diagnosited   GERD (gastroesophageal reflux disease)    Glaucoma    History of chemotherapy 01/2011 to 03/2011   concurrent w/radiation therapy   Hx of radiation therapy  01-17-11 to 03-18-11   lung   Hyperlipidemia    Hypertension    Lung cancer (HCC)    lung ca dx 2010   Malignant hyperthermia    As of 12/30/15, no personal MH history, but reported her son has a history of MH ~ 2000 (no confirmatory testing done)   Onychomycosis    Retinitis pigmentosa    Shortness of breath    with exertion   Sleep apnea    cpap 5 yrs   Past Surgical History:  Procedure Laterality Date   ABDOMINAL HYSTERECTOMY  09/19/2001   hx. of fibroids. TAH. Ovaries remain.    BACK SURGERY  06/2013   CHEST TUBE INSERTION   04/01/08   L hydropneumothorax   EYE SURGERY Bilateral    lens implant   KNEE ARTHROSCOPY  10-17-11   bil. knee scope(left was torn ligament)  LUMBAR LAMINECTOMY/DECOMPRESSION MICRODISCECTOMY  10/27/2011   Procedure: LUMBAR LAMINECTOMY/DECOMPRESSION MICRODISCECTOMY;  Surgeon: Reyes JAYSON Billing, MD;  Location: [facility];  Service: Orthopedics;  Laterality: N/A;  L4-L5   lung surgery  03/24/08   L vats, L thoracotomy and LUL trisegmentectomy with node dissection   MAXIMUM ACCESS (MAS)POSTERIOR LUMBAR INTERBODY FUSION (PLIF) 1 LEVEL N/A 07/03/2013   Procedure: FOR MAXIMUM ACCESS (MAS) POSTERIOR LUMBAR INTERBODY FUSION (PLIF) 1 LEVEL;  Surgeon: Alm GORMAN Molt, MD;  Location: [facility];  Service: Neurosurgery;  Laterality: N/A;  FOR MAXIMUM ACCESS (MAS) POSTERIOR LUMBAR INTERBODY FUSION (PLIF) 1 LEVEL (L4-L5)   PORT-A-CATH REMOVAL Right 11/26/2013   Procedure: REMOVAL PORT-A-CATH;  Surgeon: Dorise MARLA Fellers, MD;  Location: [facility] OR;  Service: Thoracic;  Laterality: Right;   PORTACATH PLACEMENT  02/11/2011   Procedure: INSERTION PORT-A-CATH;  Surgeon: JONETTA Belvie Nam, MD;  Location: [facility] OR;  Service: Thoracic;  Laterality: Right;  9.6Fr. Pre-attached Power Port in Right Internal Jugular  -right chest-remains inplace 10-17-11   SPINAL CORD STIMULATOR INSERTION N/A 02/03/2016   Procedure: LUMBAR SPINAL CORD STIMULATOR INSERTION;  Surgeon: Alm GORMAN Molt, MD;  Location: [facility] OR;  Service:  Neurosurgery;  Laterality: N/A;   Family History  Problem Relation Age of Onset   Brain cancer Maternal Aunt    Depression Mother    Heart disease Mother    Liver cancer Mother    Depression Son    Diabetes Maternal Grandmother    Stroke Maternal Grandmother    Depression Maternal Grandmother    Depression Daughter    Malignant hyperthermia Son 16       tonsils drained   Colon cancer Neg Hx    Rectal cancer Neg Hx    Stomach cancer Neg Hx    Social History   Socioeconomic History   Marital status: Married    Spouse name: Not on file   Number of children: 3   Years of education: Not on file   Highest education level: Not on file  Occupational History   Not on file  Tobacco Use   Smoking status: Every Day    Current packs/day: 0.18    Average packs/day: 0.2 packs/day for 45.7 years (8.2 ttl pk-yrs)    Types: Cigarettes    Start date: 03/14/1978   Smokeless tobacco: Never  Vaping Use   Vaping status: Never Used  Substance and Sexual Activity   Alcohol use: No    Alcohol/week: 0.0 standard drinks of alcohol   Drug use: No   Sexual activity: Yes    Comment: 11/25/13 - smoking 1/2 of 1.  Other Topics Concern   Not on file  Social History Narrative   Not on file   Social Drivers of Health   Financial Resource Strain: Low Risk  (01/12/2023)   Overall Financial Resource Strain (CARDIA)    Difficulty of Paying Living Expenses: Not hard at all  Food Insecurity: No Food Insecurity (01/12/2023)   Hunger Vital Sign    Worried About Running Out of Food in the Last Year: Never true    Ran Out of Food in the Last Year: Never true  Transportation Needs: Unmet Transportation Needs (01/12/2023)   PRAPARE - Administrator, Civil Service (Medical): Yes    Lack of Transportation (Non-Medical): Yes  Physical Activity: Inactive (01/12/2023)   Exercise Vital Sign    Days of Exercise per Week: 0 days    Minutes of Exercise per Session: 0 min  Stress: Stress Concern  Present (01/12/2023)   [employer] of Occupational Health - Occupational Stress Questionnaire    Feeling of Stress : Very much  Social Connections: Unknown (01/12/2023)   Social Connection and Isolation Panel    Frequency of Communication with Friends and Family: More than three times a week    Frequency of Social Gatherings with Friends and Family: More than three times a week    Attends Religious Services: Not on Marketing executive or Organizations: No    Attends Banker Meetings: Not on file    Marital Status: Married  Intimate Partner Violence: Not At Risk (01/12/2023)   Humiliation, Afraid, Rape, and Kick questionnaire    Fear of Current or Ex-Partner: No    Emotionally Abused: No    Physically Abused: No    Sexually Abused: No   ROS-see HPI   Negative unless + Constitutional:    weight loss, night sweats, fevers, chills, fatigue, lassitude. HEENT:    headaches, difficulty swallowing, tooth/dental problems, sore throat,       sneezing, itching, ear ache, nasal congestion, post nasal drip, snoring CV:    chest pain, orthopnea, PND, swelling in lower extremities, anasarca,                                  dizziness, palpitations Resp:   shortness of breath with exertion or at rest.                productive cough,   non-productive cough, coughing up of blood.              change in color of mucus.  wheezing.   Skin:    rash or lesions. GI:  No-   heartburn, indigestion, abdominal pain, nausea, vomiting, diarrhea,                 change in bowel habits, loss of appetite GU: dysuria, change in color of urine, no urgency or frequency.   flank pain. MS:   joint pain, stiffness, decreased range of motion, back pain. Neuro-     nothing unusual Psych:  change in mood or affect.  depression or anxiety.   memory loss.  OBJ- Physical Exam General- Alert, Oriented, Affect-appropriate, Distress- none acute, +obese, +wheelchair Skin- rash-none, lesions-  none, excoriation- none Lymphadenopathy- none Head- atraumatic            Eyes- +can see large shapes            Ears- Hearing, canals-normal            Nose- Clear, no-Septal dev, mucus, polyps, erosion, perforation             Throat- Mallampati III , mucosa clear , drainage- none, tonsils- atrophic Neck- flexible , trachea midline, no stridor , thyroid nl, carotid no bruit Chest - symmetrical excursion , unlabored           Heart/CV- RRR , no murmur , no gallop  , no rub, nl s1 s2                           - JVD- none , edema- none, stasis changes- none, varices- none           Lung- clear to P&A, wheeze- none, cough- none , dullness-none, rub- none  Chest wall-  Abd-  Br/ Gen/ Rectal- Not done, not indicated Extrem- cyanosis- none, clubbing, none, atrophy- none, strength- nl Neuro- grossly intact to observation

## 2023-11-09 ENCOUNTER — Encounter: Payer: Self-pay | Admitting: Internal Medicine

## 2023-11-09 ENCOUNTER — Ambulatory Visit

## 2023-11-09 ENCOUNTER — Ambulatory Visit: Admitting: Internal Medicine

## 2023-11-09 VITALS — BP 106/64 | HR 75 | Temp 98.4°F | Ht 62.0 in | Wt 190.0 lb

## 2023-11-09 DIAGNOSIS — J449 Chronic obstructive pulmonary disease, unspecified: Secondary | ICD-10-CM | POA: Diagnosis not present

## 2023-11-09 DIAGNOSIS — Z01811 Encounter for preprocedural respiratory examination: Secondary | ICD-10-CM | POA: Diagnosis not present

## 2023-11-09 DIAGNOSIS — J441 Chronic obstructive pulmonary disease with (acute) exacerbation: Secondary | ICD-10-CM

## 2023-11-09 DIAGNOSIS — Z85118 Personal history of other malignant neoplasm of bronchus and lung: Secondary | ICD-10-CM

## 2023-11-09 DIAGNOSIS — G4733 Obstructive sleep apnea (adult) (pediatric): Secondary | ICD-10-CM | POA: Diagnosis not present

## 2023-11-09 DIAGNOSIS — F17210 Nicotine dependence, cigarettes, uncomplicated: Secondary | ICD-10-CM

## 2023-11-09 MED ORDER — FLUTICASONE-SALMETEROL 250-50 MCG/ACT IN AEPB: 1.0000 | INHALATION_SPRAY | Freq: Two times a day (BID) | RESPIRATORY_TRACT | 12 refills | Status: AC

## 2023-11-09 MED ORDER — ALBUTEROL SULFATE HFA 108 (90 BASE) MCG/ACT IN AERS: 2.0000 | INHALATION_SPRAY | RESPIRATORY_TRACT | 12 refills | Status: AC | PRN

## 2023-11-09 NOTE — Patient Instructions (Signed)
 Order- CXR  dx COPD mixed type, hx lung Cancer

## 2023-11-13 ENCOUNTER — Encounter: Payer: Self-pay | Admitting: Internal Medicine

## 2023-11-13 DIAGNOSIS — J449 Chronic obstructive pulmonary disease, unspecified: Secondary | ICD-10-CM | POA: Insufficient documentation

## 2023-11-13 NOTE — Assessment & Plan Note (Signed)
 Clinically stable, with surgical risks reflecting chronic medical problems. Can proceed with planned surgery. Advise PAP support in Recovery room and close supervision.

## 2023-11-13 NOTE — Assessment & Plan Note (Signed)
 Clinically well-controlled Plan- continue current meds

## 2023-11-13 NOTE — Assessment & Plan Note (Signed)
 She reports comfort with CPAP and good compliance Plan- seek data from DME company. Continue current settings.

## 2023-11-14 ENCOUNTER — Inpatient Hospital Stay: Attending: Internal Medicine | Admitting: Internal Medicine

## 2023-11-14 VITALS — BP 103/71 | HR 73 | Temp 98.2°F | Resp 16 | Ht 62.0 in | Wt 192.0 lb

## 2023-11-14 DIAGNOSIS — Z923 Personal history of irradiation: Secondary | ICD-10-CM | POA: Insufficient documentation

## 2023-11-14 DIAGNOSIS — Z08 Encounter for follow-up examination after completed treatment for malignant neoplasm: Secondary | ICD-10-CM | POA: Insufficient documentation

## 2023-11-14 DIAGNOSIS — Z85118 Personal history of other malignant neoplasm of bronchus and lung: Secondary | ICD-10-CM | POA: Diagnosis not present

## 2023-11-14 DIAGNOSIS — C3492 Malignant neoplasm of unspecified part of left bronchus or lung: Secondary | ICD-10-CM | POA: Diagnosis not present

## 2023-11-14 DIAGNOSIS — Z9221 Personal history of antineoplastic chemotherapy: Secondary | ICD-10-CM | POA: Insufficient documentation

## 2023-11-14 NOTE — Progress Notes (Signed)
 [facility] [phone]   [phone]  OFFICE PROGRESS NOTE  Theresa Booty, MD [address] [place] [address]  DIAGNOSIS: Recurrent non-small cell lung cancer initially diagnosed as stage IA (T1b, N0, MX) adenocarcinoma in November 2009. She had evidence of recurrent disease in September 2012.     PRIOR THERAPY:   #1 Status post left upper lobe trisegmentectomy with lymph node dissection under the care of Dr. Brantley on March 24, 2009. #2 Concurrent chemoradiation with carboplatin  for AUC of 2 and paclitaxel  45 mg/M2 given weekly with radiation in 2012.     CURRENT THERAPY: Observation  INTERVAL HISTORY: Theresa [name] 58 y.o. female returns to the clinic today for follow-up visit. Discussed the use of AI scribe software for clinical note transcription with the patient, who gave verbal consent to proceed.  History of Present Illness Theresa [name] is a 58 year old female with lung cancer who presents for re-evaluation of her condition. She is accompanied by her daughter.  She was treated with concurrent chemoradiation in 2012 for recurrent lung cancer and has been under observation since then. No new symptoms have emerged since her last visit a year ago. She is currently seeking clearance for upcoming hip surgery, which requires evaluation from her oncologist among other specialists.  She experiences occasional headaches, which she describes as 'normal'. No chest pain, breathing issues, nausea, vomiting, or diarrhea. The patient reports that a PET scan was performed last year and she recalls that it did not show anything concerning regarding her lung nodules.  Her current medications, if any, were not discussed in the conversation.     MEDICAL HISTORY: Past Medical History:  Diagnosis Date   Anxiety    Arthritis    Blindness, legal    glaucoma and retinitis  pigmentosa   Chronic eczema    Chronic headaches 10-17-11   not migraines-  just regular   Constipation    Depression    Diabetes mellitus type II    diet    Family history of anesthesia complication    1 son had malignant hyperthermia at 57yrs  tonsils rained   Family history of malignant hyperthermia    1 son 16 years ago   Fibromyalgia    denies, has not been diagnosited   GERD (gastroesophageal reflux disease)    Glaucoma    History of chemotherapy 01/2011 to 03/2011   concurrent w/radiation therapy   Hx of radiation therapy 01-17-11 to 03-18-11   lung   Hyperlipidemia    Hypertension    Lung cancer (HCC)    lung ca dx 2010   Malignant hyperthermia    As of 12/30/15, no personal MH history, but reported her son has a history of MH ~ 2000 (no confirmatory testing done)   Onychomycosis    Retinitis pigmentosa    Shortness of breath    with exertion   Sleep apnea    cpap 5 yrs    ALLERGIES:  is allergic to penicillins, ibuprofen, codeine, morphine  and codeine, and tomato.  MEDICATIONS:  Current Outpatient Medications  Medication Sig Dispense Refill   albuterol  (PROVENTIL ) (2.5 MG/3ML) 0.083% nebulizer solution Take 6 mLs (5 mg total) by nebulization every 6 (six) hours as needed for wheezing or shortness of breath. 150 mL 1   albuterol  (VENTOLIN  HFA) 108 (90 Base) MCG/ACT inhaler Inhale 2 puffs into the lungs every 4 (four) hours as needed for wheezing or shortness of breath.  8.5 each 12   amLODipine  (NORVASC ) 10 MG tablet Take 1 tablet (10 mg total) by mouth daily. Make appt for future refills. 90 tablet 1   aspirin  EC 81 MG tablet Take 1 tablet (81 mg total) by mouth daily. 30 tablet 11   atorvastatin  (LIPITOR) 80 MG tablet Take 1 tablet (80 mg total) by mouth daily. 90 tablet 3   baclofen  (LIORESAL ) 10 MG tablet Take 0.5 tablets (5 mg total) by mouth 3 (three) times daily as needed for muscle spasms. (Patient not taking: Reported on 11/09/2023) 30 each 0   carvedilol  (COREG ) 25 MG tablet Take 1 tablet (25 mg total) by mouth 2 (two) times daily with a  meal. 180 tablet 3   clobetasol  cream (TEMOVATE ) 0.05 % Apply 1 Application topically 2 (two) times daily. 60 g 1   DULoxetine  (CYMBALTA ) 60 MG capsule TAKE 2 CAPSULES (120 MG TOTAL) BY MOUTH DAILY. 180 capsule 2   fluticasone  (FLONASE ) 50 MCG/ACT nasal spray SPRAY 2 SPRAYS INTO EACH NOSTRIL EVERY DAY 48 mL 2   fluticasone -salmeterol (WIXELA INHUB) 250-50 MCG/ACT AEPB Inhale 1 puff into the lungs in the morning and at bedtime. 60 each 12   gabapentin  (NEURONTIN ) 300 MG capsule TAKE 1 CAPSULE BY MOUTH THREE TIMES A DAY 270 capsule 1   insulin  glargine (LANTUS  SOLOSTAR) 100 UNIT/ML Solostar Pen INJECT 20 UNITS INTO THE SKIN EVERY MORNING 15 mL 3   Insulin  Pen Needle (PEN NEEDLES) 32G X 4 MM MISC 1 Stick by Does not apply route daily. 100 each 3   ipratropium (ATROVENT ) 0.03 % nasal spray Place 2 sprays into both nostrils 3 (three) times daily.     lidocaine  (LIDODERM ) 5 % Place 1 patch onto the skin daily. Remove & Discard patch within 12 hours or as directed by MD 30 patch 0   linaclotide  (LINZESS ) 290 MCG CAPS capsule Take 1 capsule (290 mcg total) by mouth daily. REQUIRES VISIT BEFORE NEXT REFILL 45 capsule 0   losartan  (COZAAR ) 25 MG tablet Take 0.5 tablets (12.5 mg total) by mouth at bedtime. (Patient not taking: Reported on 11/09/2023) 90 tablet 3   metFORMIN  (GLUCOPHAGE ) 1000 MG tablet Take 1 tablet (1,000 mg total) by mouth 2 (two) times daily with a meal. NEEDS APPOINTMENT PRIOR TO NEXT REFILL 180 tablet 0   montelukast  (SINGULAIR ) 10 MG tablet Take 1 tablet (10 mg total) by mouth at bedtime. 90 tablet 3   omeprazole  (PRILOSEC) 20 MG capsule Take 1 capsule (20 mg total) by mouth daily. 30 capsule 2   Semaglutide ,0.25 or 0.5MG /DOS, (OZEMPIC , 0.25 OR 0.5 MG/DOSE,) 2 MG/3ML SOPN Inject 0.5 mg as directed once a week. 9 mL 2   Spacer/Aero-Holding Chambers (AEROCHAMBER MV) inhaler Use as instructed 1 each 0   triamcinolone  cream (KENALOG ) 0.1 % Apply 1 Application topically 2 (two) times daily. 30  g 0   varenicline  (CHANTIX  CONTINUING MONTH PAK) 1 MG tablet Take by mouth. (Patient not taking: Reported on 11/09/2023)     Current Facility-Administered Medications  Medication Dose Route Frequency Provider Last Rate Last Admin   azelastine  (ASTELIN ) 0.1 % nasal spray 2 spray  2 spray Each Nare BID Alaine Vicenta NOVAK, MD        SURGICAL HISTORY:  Past Surgical History:  Procedure Laterality Date   ABDOMINAL HYSTERECTOMY  09/19/2001   hx. of fibroids. TAH. Ovaries remain.    BACK SURGERY  06/2013   CHEST TUBE INSERTION   04/01/08   L hydropneumothorax   EYE  SURGERY Bilateral    lens implant   KNEE ARTHROSCOPY  10-17-11   bil. knee scope(left was torn ligament)   LUMBAR LAMINECTOMY/DECOMPRESSION MICRODISCECTOMY  10/27/2011   Procedure: LUMBAR LAMINECTOMY/DECOMPRESSION MICRODISCECTOMY;  Surgeon: Reyes JAYSON Billing, MD;  Location: [facility];  Service: Orthopedics;  Laterality: N/A;  L4-L5   lung surgery  03/24/08   L vats, L thoracotomy and LUL trisegmentectomy with node dissection   MAXIMUM ACCESS (MAS)POSTERIOR LUMBAR INTERBODY FUSION (PLIF) 1 LEVEL N/A 07/03/2013   Procedure: FOR MAXIMUM ACCESS (MAS) POSTERIOR LUMBAR INTERBODY FUSION (PLIF) 1 LEVEL;  Surgeon: Alm GORMAN Molt, MD;  Location: [facility];  Service: Neurosurgery;  Laterality: N/A;  FOR MAXIMUM ACCESS (MAS) POSTERIOR LUMBAR INTERBODY FUSION (PLIF) 1 LEVEL (L4-L5)   PORT-A-CATH REMOVAL Right 11/26/2013   Procedure: REMOVAL PORT-A-CATH;  Surgeon: Dorise MARLA Fellers, MD;  Location: [facility] OR;  Service: Thoracic;  Laterality: Right;   PORTACATH PLACEMENT  02/11/2011   Procedure: INSERTION PORT-A-CATH;  Surgeon: JONETTA Belvie Nam, MD;  Location: [facility];  Service: Thoracic;  Laterality: Right;  9.6Fr. Pre-attached Power Port in Right Internal Jugular  -right chest-remains inplace 10-17-11   SPINAL CORD STIMULATOR INSERTION N/A 02/03/2016   Procedure: LUMBAR SPINAL CORD STIMULATOR INSERTION;  Surgeon: Alm GORMAN Molt, MD;  Location: [facility] OR;  Service:  Neurosurgery;  Laterality: N/A;    REVIEW OF SYSTEMS:  A comprehensive review of systems was negative except for: Constitutional: positive for fatigue Respiratory: positive for dyspnea on exertion Musculoskeletal: positive for arthralgias   PHYSICAL EXAMINATION: General appearance: alert, cooperative, fatigued, and no distress Head: Normocephalic, without obvious abnormality, atraumatic Neck: no adenopathy, no JVD, supple, symmetrical, trachea midline, and thyroid not enlarged, symmetric, no tenderness/mass/nodules Lymph nodes: Cervical, supraclavicular, and axillary nodes normal. Resp: clear to auscultation bilaterally Back: symmetric, no curvature. ROM normal. No CVA tenderness. Cardio: regular rate and rhythm, S1, S2 normal, no murmur, click, rub or gallop GI: soft, non-tender; bowel sounds normal; no masses,  no organomegaly Extremities: extremities normal, atraumatic, no cyanosis or edema  ECOG PERFORMANCE STATUS: 1 - Symptomatic but completely ambulatory  Blood pressure 103/71, pulse 73, temperature 98.2 F (36.8 C), temperature source Temporal, resp. rate 16, height 5' 2 (1.575 m), weight 192 lb (87.1 kg), SpO2 100%.  LABORATORY DATA: Lab Results  Component Value Date   WBC 7.2 09/20/2022   HGB 15.2 (H) 09/20/2022   HCT 46.2 (H) 09/20/2022   MCV 92.4 09/20/2022   PLT 215 09/20/2022      Chemistry      Component Value Date/Time   NA 140 09/20/2022 0834   NA 143 03/24/2021 1209   NA 139 02/06/2015 0901   K 3.4 (L) 09/20/2022 0834   K 3.9 02/06/2015 0901   CL 105 09/20/2022 0834   CL 102 01/18/2012 0805   CO2 28 09/20/2022 0834   CO2 28 02/06/2015 0901   BUN 18 09/20/2022 0834   BUN 11 03/24/2021 1209   BUN 10.8 02/06/2015 0901   CREATININE 1.69 (H) 09/20/2022 0834   CREATININE 1.5 (H) 02/06/2015 0901      Component Value Date/Time   CALCIUM  9.4 09/20/2022 0834   CALCIUM  9.6 02/06/2015 0901   ALKPHOS 56 09/20/2022 0834   ALKPHOS 71 02/06/2015 0901   AST 15  09/20/2022 0834   AST 12 02/06/2015 0901   ALT 16 09/20/2022 0834   ALT 16 02/06/2015 0901   BILITOT 0.6 09/20/2022 0834   BILITOT 0.87 02/06/2015 0901       RADIOGRAPHIC STUDIES:  No results found.  ASSESSMENT AND PLAN: This is a very pleasant 58 years old African-American female with Recurrent non-small cell lung cancer initially diagnosed as stage IA (T1b, N0, MX) adenocarcinoma in November 2009. She had evidence of recurrent disease in September 2012.   She is status post left upper lobe trisegmentectomy with lymph node dissection under the care of Dr. Brantley on March 24, 2009. On disease recurrence she underwent concurrent chemoradiation with carboplatin  for AUC of 2 and paclitaxel  45 mg/M2 given weekly with radiation in 2012.  She is currently on observation. Assessment and Plan Assessment & Plan Preoperative oncologic clearance for hip surgery Oncologic clearance required for upcoming hip surgery. No contraindications from an oncologic perspective. Recent chest x-ray showed no concerning findings. History of lung cancer is not a current issue for surgery clearance but other clearance from pulmonary medicine, cardiology and her primary care physician may be required. - Document in notes that there are no oncologic contraindications for hip surgery.  History of lung cancer, status post chemoradiation, in remission Lung cancer treated with chemoradiation in 2012, currently in remission. No evidence of recurrence on recent PET scan and chest x-ray. No new symptoms suggestive of recurrence. We will see the patient on as-needed basis at this point. The patient voices understanding of current disease status and treatment options and is in agreement with the current care plan.  All questions were answered. The patient knows to call the clinic with any problems, questions or concerns. We can certainly see the patient much sooner if necessary.   Disclaimer: This note was dictated with  voice recognition software. Similar sounding words can inadvertently be transcribed and may not be corrected upon review.

## 2023-11-21 ENCOUNTER — Ambulatory Visit: Payer: Self-pay | Admitting: Internal Medicine

## 2023-12-09 ENCOUNTER — Other Ambulatory Visit: Payer: Self-pay | Admitting: Family Medicine

## 2023-12-09 DIAGNOSIS — E119 Type 2 diabetes mellitus without complications: Secondary | ICD-10-CM

## 2023-12-09 DIAGNOSIS — M792 Neuralgia and neuritis, unspecified: Secondary | ICD-10-CM

## 2024-01-12 NOTE — Progress Notes (Signed)
 Theresa [name]                                          MRN: [id number]   01/12/2024   The [facility] Quality Team Specialist reviewed this patient medical record for the purposes of chart review for care gap closure. The following were reviewed: abstraction for care gap closure-glycemic status assessment.    [facility]BC[facility] Quality Team

## 2024-01-12 NOTE — Progress Notes (Signed)
 Theresa [name]                                          MRN: [id number]   01/12/2024   The [facility] Quality Team Specialist reviewed this patient medical record for the purposes of chart review for care gap closure. The following were reviewed: chart review for care gap closure-kidney health evaluation for diabetes:eGFR  and uACR.    [facility] Quality Team

## 2024-01-15 ENCOUNTER — Ambulatory Visit: Payer: Medicare Other

## 2024-01-15 VITALS — Ht 62.0 in | Wt 190.0 lb

## 2024-01-15 DIAGNOSIS — Z0000 Encounter for general adult medical examination without abnormal findings: Secondary | ICD-10-CM | POA: Diagnosis not present

## 2024-01-15 NOTE — Patient Instructions (Signed)
 Theresa [name],  Thank you for taking the time for your Medicare Wellness Visit. I appreciate your continued commitment to your health goals. Please review the care plan we discussed, and feel free to reach out if I can assist you further.  Please note that Annual Wellness Visits do not include a physical exam. Some assessments may be limited, especially if the visit was conducted virtually. If needed, we may recommend an in-person follow-up with your provider.  Ongoing Care Seeing your primary care provider every 3 to 6 months helps us  monitor your health and provide consistent, personalized care.   Referrals If a referral was made during today's visit and you haven't received any updates within two weeks, please contact the referred provider directly to check on the status.  Recommended Screenings:  Health Maintenance  Topic Date Due   Zoster (Shingles) Vaccine (1 of 2) Never done   Breast Cancer Screening  Never done   Pneumococcal Vaccine for age over 25 (2 of 2 - PCV) 02/13/2018   Eye exam for diabetics  05/31/2018   COVID-19 Vaccine (3 - Pfizer risk series) 02/26/2020   Colon Cancer Screening  04/18/2023   Yearly kidney function blood test for diabetes  09/20/2023   Hemoglobin A1C  09/26/2023   Yearly kidney health urinalysis for diabetes  10/07/2023   Flu Shot  10/13/2023   Complete foot exam   03/28/2024   Medicare Annual Wellness Visit  01/14/2025   DTaP/Tdap/Td vaccine (3 - Td or Tdap) 12/02/2026   Hepatitis C Screening  Completed   HIV Screening  Completed   Hepatitis B Vaccine  Aged Out   HPV Vaccine  Aged Out   Meningitis B Vaccine  Aged Out       01/15/2024   11:16 AM  Advanced Directives  Does Patient Have a Medical Advance Directive? No  Would patient like information on creating a medical advance directive? No - Patient declined    Vision: Annual vision screenings are recommended for early detection of glaucoma, cataracts, and diabetic retinopathy. These exams  can also reveal signs of chronic conditions such as diabetes and high blood pressure.  Dental: Annual dental screenings help detect early signs of oral cancer, gum disease, and other conditions linked to overall health, including heart disease and diabetes.  Please see the attached documents for additional preventive care recommendations.

## 2024-01-15 NOTE — Progress Notes (Signed)
 Subjective:   Theresa [name] is a 58 y.o. female who presents for a Medicare Annual Wellness Visit.  Allergies (verified) Penicillins, Ibuprofen, Codeine, Morphine  and codeine, and Tomato   History: Past Medical History:  Diagnosis Date   Anxiety    Arthritis    Blindness, legal    glaucoma and retinitis  pigmentosa   Chronic eczema    Chronic headaches 10-17-11   not migraines- just regular   Constipation    Depression    Diabetes mellitus type II    diet    Family history of anesthesia complication    1 son had malignant hyperthermia at 17yrs  tonsils rained   Family history of malignant hyperthermia    1 son 16 years ago   Fibromyalgia    denies, has not been diagnosited   GERD (gastroesophageal reflux disease)    Glaucoma    History of chemotherapy 01/2011 to 03/2011   concurrent w/radiation therapy   Hx of radiation therapy 01-17-11 to 03-18-11   lung   Hyperlipidemia    Hypertension    Lung cancer (HCC)    lung ca dx 2010   Malignant hyperthermia    As of 12/30/15, no personal MH history, but reported her son has a history of MH ~ 2000 (no confirmatory testing done)   Onychomycosis    Retinitis pigmentosa    Shortness of breath    with exertion   Sleep apnea    cpap 5 yrs   Past Surgical History:  Procedure Laterality Date   ABDOMINAL HYSTERECTOMY  09/19/2001   hx. of fibroids. TAH. Ovaries remain.    BACK SURGERY  06/2013   CHEST TUBE INSERTION   04/01/08   L hydropneumothorax   EYE SURGERY Bilateral    lens implant   KNEE ARTHROSCOPY  10-17-11   bil. knee scope(left was torn ligament)   LUMBAR LAMINECTOMY/DECOMPRESSION MICRODISCECTOMY  10/27/2011   Procedure: LUMBAR LAMINECTOMY/DECOMPRESSION MICRODISCECTOMY;  Surgeon: Reyes JAYSON Billing, MD;  Location: [facility];  Service: Orthopedics;  Laterality: N/A;  L4-L5   lung surgery  03/24/08   L vats, L thoracotomy and LUL trisegmentectomy with node dissection   MAXIMUM ACCESS (MAS)POSTERIOR LUMBAR INTERBODY FUSION (PLIF)  1 LEVEL N/A 07/03/2013   Procedure: FOR MAXIMUM ACCESS (MAS) POSTERIOR LUMBAR INTERBODY FUSION (PLIF) 1 LEVEL;  Surgeon: Alm GORMAN Molt, MD;  Location: [facility];  Service: Neurosurgery;  Laterality: N/A;  FOR MAXIMUM ACCESS (MAS) POSTERIOR LUMBAR INTERBODY FUSION (PLIF) 1 LEVEL (L4-L5)   PORT-A-CATH REMOVAL Right 11/26/2013   Procedure: REMOVAL PORT-A-CATH;  Surgeon: Dorise MARLA Fellers, MD;  Location: [facility] OR;  Service: Thoracic;  Laterality: Right;   PORTACATH PLACEMENT  02/11/2011   Procedure: INSERTION PORT-A-CATH;  Surgeon: JONETTA Belvie Nam, MD;  Location: [facility] OR;  Service: Thoracic;  Laterality: Right;  9.6Fr. Pre-attached Power Port in Right Internal Jugular  -right chest-remains inplace 10-17-11   SPINAL CORD STIMULATOR INSERTION N/A 02/03/2016   Procedure: LUMBAR SPINAL CORD STIMULATOR INSERTION;  Surgeon: Alm GORMAN Molt, MD;  Location: [facility] OR;  Service: Neurosurgery;  Laterality: N/A;   Family History  Problem Relation Age of Onset   Brain cancer Maternal Aunt    Depression Mother    Heart disease Mother    Liver cancer Mother    Depression Son    Diabetes Maternal Grandmother    Stroke Maternal Grandmother    Depression Maternal Grandmother    Depression Daughter    Malignant hyperthermia Son 95  tonsils drained   Colon cancer Neg Hx    Rectal cancer Neg Hx    Stomach cancer Neg Hx    Social History   Occupational History   Not on file  Tobacco Use   Smoking status: Every Day    Current packs/day: 0.18    Average packs/day: 0.2 packs/day for 45.8 years (8.3 ttl pk-yrs)    Types: Cigarettes    Start date: 03/14/1978   Smokeless tobacco: Never  Vaping Use   Vaping status: Never Used  Substance and Sexual Activity   Alcohol use: No    Alcohol/week: 0.0 standard drinks of alcohol   Drug use: No   Sexual activity: Yes    Comment: 11/25/13 - smoking 1/2 of 1.   Tobacco Counseling Ready to quit: Not Answered Counseling given: Not Answered  SDOH Screenings   Food  Insecurity: No Food Insecurity (01/15/2024)  Housing: Low Risk  (01/15/2024)  Transportation Needs: Unmet Transportation Needs (01/15/2024)  Utilities: Not At Risk (01/15/2024)  Depression (PHQ2-9): Low Risk  (01/15/2024)  Financial Resource Strain: Low Risk  (01/12/2023)  Physical Activity: Inactive (01/15/2024)  Social Connections: Moderately Isolated (01/15/2024)  Stress: No Stress Concern Present (01/15/2024)  Tobacco Use: High Risk (01/15/2024)  Health Literacy: Adequate Health Literacy (01/15/2024)   Depression Screen    01/15/2024   11:15 AM 01/12/2023   10:22 AM 10/07/2022   12:31 PM 04/15/2021    9:37 AM 03/24/2021   11:34 AM 12/15/2020    2:26 PM 05/05/2020    1:30 PM  PHQ 2/9 Scores  PHQ - 2 Score 0 6 5 6 4 2 3   PHQ- 9 Score 0 15 19 16 14 12 11      Goals Addressed             This Visit's Progress    01/15/2024: To get healthier and heal from hip replacement surgery.         Visit info / Clinical Intake: Medicare Wellness Visit Type:: Subsequent Annual Wellness Visit Medicare Wellness Visit Mode:: Telephone If telephone:: video declined If telephone or video:: pt reported vitals Interpreter Needed?: No Pre-visit prep was completed: yes AWV questionnaire completed by patient prior to visit?: no Living arrangements:: lives with spouse/significant other; with family/others Patient's Overall Health Status Rating: good Typical amount of pain: (!) a lot (LEFT HIP REPLACEMENT) Does pain affect daily life?: no Are you currently prescribed opioids?: (!) yes (OXYCODONE )  Dietary Habits and Nutritional Risks How many meals a day?: 3 Eats fruit and vegetables daily?: yes Most meals are obtained by: preparing own meals In the last 2 weeks, have you had any of the following?: -- (NO) Diabetic:: (!) yes Any non-healing wounds?: no How often do you check your BS?: 3 Would you like to be referred to a Nutritionist or for Diabetic Management? : no  Functional Status Activities of  Daily Living (to include ambulation/medication): Independent Ambulation: Independent with device- listed below Home Assistive Devices/Equipment: Walker (specify Type); CPAP; Beside Commode; Communication device (specify type); Eyeglasses; Shower/tub chair Medication Administration: Independent Home Management: Independent Manage your own finances?: yes Primary transportation is: facility / other ([facility]) Concerns about vision?: no *vision screening is required for WTM* Concerns about hearing?: no  Fall Screening Falls in the past year?: 0 Number of falls in past year: 0 Was there an injury with Fall?: 0 Fall Risk Category Calculator: 0 Patient Fall Risk Level: Low Fall Risk  Fall Risk Patient at Risk for Falls Due to: No Fall Risks  Fall risk Follow up: Falls evaluation completed; Education provided  Home and Transportation Safety: All rugs have non-skid backing?: yes All stairs or steps have railings?: (!) no Grab bars in the bathtub or shower?: (!) no Have non-skid surface in bathtub or shower?: yes Good home lighting?: yes Regular seat belt use?: yes Hospital stays in the last year:: (!) yes How many hospital stays:: 1 Reason: LEFT HIP REPLACEMENT  Cognitive Assessment Difficulty concentrating, remembering, or making decisions? : no Will 6CIT or Mini Cog be Completed: no 6CIT or Mini Cog Declined: patient alert, oriented, able to answer questions appropriately and recall recent events  Advance Directives (For Healthcare) Does Patient Have a Medical Advance Directive?: No Would patient like information on creating a medical advance directive?: No - Patient declined        Objective:    Today's Vitals   01/15/24 1112  Weight: 190 lb (86.2 kg)  Height: 5' 2 (1.575 m)  PainSc: 9   PainLoc: Hip   Body mass index is 34.75 kg/m.  Current Medications (verified) Outpatient Encounter Medications as of 01/15/2024  Medication Sig   albuterol  (PROVENTIL ) (2.5 MG/3ML)  0.083% nebulizer solution Take 6 mLs (5 mg total) by nebulization every 6 (six) hours as needed for wheezing or shortness of breath.   albuterol  (VENTOLIN  HFA) 108 (90 Base) MCG/ACT inhaler Inhale 2 puffs into the lungs every 4 (four) hours as needed for wheezing or shortness of breath.   amLODipine  (NORVASC ) 10 MG tablet Take 1 tablet (10 mg total) by mouth daily. Make appt for future refills.   aspirin  EC 81 MG tablet Take 1 tablet (81 mg total) by mouth daily.   atorvastatin  (LIPITOR) 80 MG tablet Take 1 tablet (80 mg total) by mouth daily.   carvedilol  (COREG ) 25 MG tablet Take 1 tablet (25 mg total) by mouth 2 (two) times daily with a meal.   clobetasol  cream (TEMOVATE ) 0.05 % Apply 1 Application topically 2 (two) times daily.   DULoxetine  (CYMBALTA ) 60 MG capsule TAKE 2 CAPSULES (120 MG TOTAL) BY MOUTH DAILY.   fluticasone  (FLONASE ) 50 MCG/ACT nasal spray SPRAY 2 SPRAYS INTO EACH NOSTRIL EVERY DAY   fluticasone -salmeterol (WIXELA INHUB) 250-50 MCG/ACT AEPB Inhale 1 puff into the lungs in the morning and at bedtime.   gabapentin  (NEURONTIN ) 300 MG capsule TAKE 1 CAPSULE BY MOUTH THREE TIMES A DAY   insulin  glargine (LANTUS  SOLOSTAR) 100 UNIT/ML Solostar Pen INJECT 20 UNITS INTO THE SKIN EVERY MORNING   Insulin  Pen Needle (PEN NEEDLES) 32G X 4 MM MISC 1 Stick by Does not apply route daily.   ipratropium (ATROVENT ) 0.03 % nasal spray Place 2 sprays into both nostrils 3 (three) times daily.   lidocaine  (LIDODERM ) 5 % Place 1 patch onto the skin daily. Remove & Discard patch within 12 hours or as directed by MD   linaclotide  (LINZESS ) 290 MCG CAPS capsule Take 1 capsule (290 mcg total) by mouth daily. REQUIRES VISIT BEFORE NEXT REFILL   metFORMIN  (GLUCOPHAGE ) 1000 MG tablet Take 1 tablet (1,000 mg total) by mouth 2 (two) times daily with a meal. NEEDS APPOINTMENT PRIOR TO NEXT REFILL   montelukast  (SINGULAIR ) 10 MG tablet Take 1 tablet (10 mg total) by mouth at bedtime.   omeprazole  (PRILOSEC) 20  MG capsule Take 1 capsule (20 mg total) by mouth daily.   ondansetron  (ZOFRAN -ODT) 4 MG disintegrating tablet PLACE 1 TABLET EVERY 6 HOURS BY TRANSLINGUAL ROUTE AS NEEDED, FOR NAUSEA.   oxyCODONE  (OXY IR/ROXICODONE ) 5 MG  immediate release tablet TAKE 1 TABLET EVERY 6 HOURS BY ORAL ROUTE AS NEEDED FOR 5 DAYS, FOR PAIN.   Semaglutide ,0.25 or 0.5MG /DOS, (OZEMPIC , 0.25 OR 0.5 MG/DOSE,) 2 MG/3ML SOPN Inject 0.5 mg as directed once a week.   Spacer/Aero-Holding Chambers (AEROCHAMBER MV) inhaler Use as instructed   triamcinolone  cream (KENALOG ) 0.1 % Apply 1 Application topically 2 (two) times daily.   baclofen  (LIORESAL ) 10 MG tablet Take 0.5 tablets (5 mg total) by mouth 3 (three) times daily as needed for muscle spasms. (Patient not taking: Reported on 01/15/2024)   losartan  (COZAAR ) 25 MG tablet Take 0.5 tablets (12.5 mg total) by mouth at bedtime. (Patient not taking: Reported on 01/15/2024)   varenicline  (CHANTIX  CONTINUING MONTH PAK) 1 MG tablet Take by mouth. (Patient not taking: Reported on 01/15/2024)   Facility-Administered Encounter Medications as of 01/15/2024  Medication   azelastine  (ASTELIN ) 0.1 % nasal spray 2 spray   Hearing/Vision screen Hearing Screening - Comments:: Adequate hearing. Vision Screening - Comments:: Adequate vision with eyeglasses. Eye Doctor: [facility] Immunizations and Health Maintenance Health Maintenance  Topic Date Due   Zoster Vaccines- Shingrix (1 of 2) Never done   Mammogram  Never done   Pneumococcal Vaccine: 50+ Years (2 of 2 - PCV) 02/13/2018   OPHTHALMOLOGY EXAM  05/31/2018   COVID-19 Vaccine (3 - Pfizer risk series) 02/26/2020   Colonoscopy  04/18/2023   Diabetic kidney evaluation - eGFR measurement  09/20/2023   HEMOGLOBIN A1C  09/26/2023   Diabetic kidney evaluation - Urine ACR  10/07/2023   Influenza Vaccine  10/13/2023   FOOT EXAM  03/28/2024   Medicare Annual Wellness (AWV)  01/14/2025   DTaP/Tdap/Td (3 - Td or Tdap) 12/02/2026    Hepatitis C Screening  Completed   HIV Screening  Completed   Hepatitis B Vaccines 19-59 Average Risk  Aged Out   HPV VACCINES  Aged Out   Meningococcal B Vaccine  Aged Out        Assessment/Plan:  This is a routine wellness examination for Monserrat.  Patient Care Team: Romelle Booty, MD as PCP - General (Family Medicine) Funches, Josalyn, MD as Referring Physician (Family Medicine) Neysa Reggy BIRCH, MD as Consulting Physician (Pulmonary Disease) Octavia, Charlie Hamilton, MD as Consulting Physician (Ophthalmology)  I have personally reviewed and noted the following in the patient's chart:   Medical and social history Use of alcohol, tobacco or illicit drugs  Current medications and supplements including opioid prescriptions. Functional ability and status Nutritional status Physical activity Advanced directives List of other physicians Hospitalizations, surgeries, and ER visits in previous 12 months Vitals Screenings to include cognitive, depression, and falls Referrals and appointments  No orders of the defined types were placed in this encounter.  In addition, I have reviewed and discussed with patient certain preventive protocols, quality metrics, and best practice recommendations. A written personalized care plan for preventive services as well as general preventive health recommendations were provided to patient.   Roz LOISE Fuller, LPN   [id number]/08/[id number]   Return in 1 year (on 01/14/2025).  After Visit Summary: (Declined) Due to this being a telephonic visit, with patients personalized plan was offered to patient but patient Declined AVS at this time   Nurse Notes: Patient is due for vaccines and screenings.

## 2024-01-17 NOTE — Progress Notes (Signed)
 I connected with  Theresa [name] on 01/15/2024 by a audio enabled telemedicine application and verified that I am speaking with the correct person using two identifiers.  Patient Location: Home  Provider Location: Office/Clinic  I discussed the limitations of evaluation and management by telemedicine. The patient expressed understanding and agreed to proceed.  Kymora Sciara N. Tomie, LPN [employer]

## 2024-01-29 ENCOUNTER — Telehealth: Payer: Self-pay | Admitting: Pharmacist

## 2024-01-29 NOTE — Telephone Encounter (Signed)
 Attempted to contact patient for follow-up of scheduling appointment with PCP for DM QI'25.    Left HIPAA compliant voice mail requesting call back to direct phone: [phone]  Total time with patient call and documentation of interaction: 5 minutes.

## 2024-02-06 ENCOUNTER — Other Ambulatory Visit: Payer: Self-pay | Admitting: Family Medicine

## 2024-02-06 DIAGNOSIS — F331 Major depressive disorder, recurrent, moderate: Secondary | ICD-10-CM

## 2024-02-06 DIAGNOSIS — M792 Neuralgia and neuritis, unspecified: Secondary | ICD-10-CM

## 2024-04-05 ENCOUNTER — Encounter: Payer: Self-pay | Admitting: Pharmacist

## 2024-04-05 NOTE — Progress Notes (Signed)
 This patient is appearing on a report for being at risk of failing the adherence measure for cholesterol (statin) medications this calendar year.   Medication: atorvastatin  Last fill date: 03/13/24 for 90 day supply  Reviewed medication indication, dosing, and goals of therapy.
